# Patient Record
Sex: Female | Born: 1935 | Race: White | Hispanic: No | Smoking: Former smoker
Health system: Southern US, Community
[De-identification: ages and names within clinical notes are randomized; demographics above are authoritative.]

## PROBLEM LIST (undated history)

## (undated) DIAGNOSIS — A419 Sepsis, unspecified organism: Secondary | ICD-10-CM

## (undated) DIAGNOSIS — K115 Sialolithiasis: Secondary | ICD-10-CM

## (undated) DIAGNOSIS — R0600 Dyspnea, unspecified: Secondary | ICD-10-CM

## (undated) DIAGNOSIS — I2510 Atherosclerotic heart disease of native coronary artery without angina pectoris: Secondary | ICD-10-CM

## (undated) DIAGNOSIS — Z8619 Personal history of other infectious and parasitic diseases: Secondary | ICD-10-CM

## (undated) DIAGNOSIS — I10 Essential (primary) hypertension: Secondary | ICD-10-CM

## (undated) DIAGNOSIS — T8859XA Other complications of anesthesia, initial encounter: Secondary | ICD-10-CM

## (undated) DIAGNOSIS — I509 Heart failure, unspecified: Secondary | ICD-10-CM

## (undated) DIAGNOSIS — N200 Calculus of kidney: Secondary | ICD-10-CM

## (undated) DIAGNOSIS — Z87442 Personal history of urinary calculi: Secondary | ICD-10-CM

## (undated) DIAGNOSIS — C801 Malignant (primary) neoplasm, unspecified: Secondary | ICD-10-CM

## (undated) DIAGNOSIS — J449 Chronic obstructive pulmonary disease, unspecified: Secondary | ICD-10-CM

## (undated) DIAGNOSIS — Z9289 Personal history of other medical treatment: Secondary | ICD-10-CM

## (undated) DIAGNOSIS — M1990 Unspecified osteoarthritis, unspecified site: Secondary | ICD-10-CM

## (undated) DIAGNOSIS — M40209 Unspecified kyphosis, site unspecified: Secondary | ICD-10-CM

## (undated) DIAGNOSIS — T4145XA Adverse effect of unspecified anesthetic, initial encounter: Secondary | ICD-10-CM

## (undated) DIAGNOSIS — Z8679 Personal history of other diseases of the circulatory system: Secondary | ICD-10-CM

## (undated) HISTORY — PX: BUNIONECTOMY: SHX129

## (undated) HISTORY — PX: TONSILLECTOMY: SUR1361

## (undated) HISTORY — PX: CHOLECYSTECTOMY: SHX55

## (undated) HISTORY — DX: Essential (primary) hypertension: I10

## (undated) HISTORY — PX: TOTAL HIP ARTHROPLASTY: SHX124

## (undated) HISTORY — PX: LITHOTRIPSY: SUR834

## (undated) HISTORY — PX: CATARACT EXTRACTION: SUR2

## (undated) HISTORY — PX: BLADDER SURGERY: SHX569

## (undated) HISTORY — DX: Sepsis, unspecified organism: A41.9

---
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
## 2008-06-06 ENCOUNTER — Ambulatory Visit: Payer: Self-pay | Admitting: Cardiology

## 2009-01-23 ENCOUNTER — Ambulatory Visit (HOSPITAL_COMMUNITY): Admission: RE | Admit: 2009-01-23 | Discharge: 2009-01-23 | Payer: Self-pay | Admitting: Urology

## 2012-04-03 DIAGNOSIS — R079 Chest pain, unspecified: Secondary | ICD-10-CM

## 2012-04-10 ENCOUNTER — Other Ambulatory Visit: Payer: Self-pay | Admitting: Cardiology

## 2012-04-10 ENCOUNTER — Encounter: Payer: Self-pay | Admitting: Cardiology

## 2012-04-10 DIAGNOSIS — R079 Chest pain, unspecified: Secondary | ICD-10-CM

## 2012-04-10 DIAGNOSIS — R0602 Shortness of breath: Secondary | ICD-10-CM

## 2012-04-20 ENCOUNTER — Telehealth: Payer: Self-pay | Admitting: *Deleted

## 2012-04-20 NOTE — Telephone Encounter (Signed)
Message copied by [name] on Thu Apr 20, 2012  1:41 PM ------      Message from: Lewayne Bunting E      Created: Wed Apr 19, 2012  6:57 AM       Normal stress ECHO

## 2012-04-20 NOTE — Telephone Encounter (Signed)
Notes Recorded by [name], LPN on 9/81/1914 at 1:41 PM Patient notified and verbalized understanding.

## 2012-10-12 ENCOUNTER — Encounter (INDEPENDENT_AMBULATORY_CARE_PROVIDER_SITE_OTHER): Payer: Self-pay | Admitting: *Deleted

## 2012-11-06 ENCOUNTER — Telehealth (INDEPENDENT_AMBULATORY_CARE_PROVIDER_SITE_OTHER): Payer: Self-pay | Admitting: *Deleted

## 2012-11-06 ENCOUNTER — Encounter (INDEPENDENT_AMBULATORY_CARE_PROVIDER_SITE_OTHER): Payer: Self-pay | Admitting: Internal Medicine

## 2012-11-06 ENCOUNTER — Encounter (HOSPITAL_COMMUNITY): Payer: Self-pay | Admitting: Pharmacy Technician

## 2012-11-06 ENCOUNTER — Other Ambulatory Visit (INDEPENDENT_AMBULATORY_CARE_PROVIDER_SITE_OTHER): Payer: Self-pay | Admitting: *Deleted

## 2012-11-06 ENCOUNTER — Ambulatory Visit (INDEPENDENT_AMBULATORY_CARE_PROVIDER_SITE_OTHER): Payer: Medicare Other | Admitting: Internal Medicine

## 2012-11-06 VITALS — BP 130/48 | HR 68 | Temp 98.0°F | Ht 59.5 in | Wt 119.4 lb

## 2012-11-06 DIAGNOSIS — Z1211 Encounter for screening for malignant neoplasm of colon: Secondary | ICD-10-CM

## 2012-11-06 DIAGNOSIS — K625 Hemorrhage of anus and rectum: Secondary | ICD-10-CM

## 2012-11-06 DIAGNOSIS — I10 Essential (primary) hypertension: Secondary | ICD-10-CM

## 2012-11-06 MED ORDER — PEG-KCL-NACL-NASULF-NA ASC-C 100 G PO SOLR: 1.0000 | Freq: Once | ORAL | Status: DC

## 2012-11-06 NOTE — Telephone Encounter (Signed)
Patient needs movi prep 

## 2012-11-06 NOTE — Progress Notes (Signed)
Subjective:     Patient ID: Hannah Mckee, female   DOB: 07-14-1931, 79 y.o.   MRN: [id number]  HPIReferred to our office by Dr. Sherril Mckee for a colonoscopy. Seen in their office and was positive for blood on heme card.  She also tells me she had a external hemorrhoids and noted to have lacerated hemorrhoid per notes.  from where her stool was so hard. She usually has a BM four times a week. She takes Miralax once a week. Her stools are brown. She has seen bright red blood mixed in with her stools. Occurred x 1. She tells me she stays constipated if she doesn't take the Miralax. Appetite is good. No weight loss. No abdominal pain. She tells me she has no pain anywhere.  Takes ASA 81mg  daily.  She takes Ibuprofen 2 tabs about twice a week for headaches. She does not exceed over 4 a week.  10/07/2012 H and H 13.1 and 40.5, MCV 88. (CBC was normal).  ALP 74, AST 12, ALT 9, Glucose 97. (Cmet normal).  Colonoscopy Dr. Cleotis Mckee 2007: Rectum appeared normal. Mild diverticulosis was found in the sigmoid. A stricture vs sharp angulation was found in the perianal sigmoid colon. Rectoflexion was not performed.  Barium Enema 11/30/2005: Negative single contrast barium. Overall sigmoid itself is slightly narrowed but without focal stricture. No mucosa or mural abnormalities are identified. Review of Systems     Objective:   Physical Exam  Filed Vitals:   11/06/12 1100  BP: 130/48  Pulse: 68  Temp: 98 F (36.7 C)  Height: 4' 11.5" (1.511 m)  Weight: 119 lb 6.4 oz (54.159 kg)  Alert and oriented. Skin warm and dry. Oral mucosa is moist.   . Sclera anicteric, conjunctivae is pink. Thyroid not enlarged. No cervical lymphadenopathy. Lungs clear. Heart regular rate and rhythm.  Abdomen is soft. Bowel sounds are positive. No hepatomegaly. No abdominal masses felt. No tenderness.  No edema to lower extremities.Stool brown and guaiac negative. No external hemorroids      Assessment:       Rectal bleed.  Colonic neoplasm, AVM, polyp needs to be ruled out,. I discussed this case with Dr. Karilyn Mckee.   Plan:    Colonoscopy with Dr. Karilyn Mckee

## 2012-11-06 NOTE — Patient Instructions (Addendum)
Colonoscopy with Dr. Rehman 

## 2012-11-13 ENCOUNTER — Telehealth (INDEPENDENT_AMBULATORY_CARE_PROVIDER_SITE_OTHER): Payer: Self-pay | Admitting: *Deleted

## 2012-11-13 DIAGNOSIS — Z1211 Encounter for screening for malignant neoplasm of colon: Secondary | ICD-10-CM

## 2012-11-13 MED ORDER — PEG-KCL-NACL-NASULF-NA ASC-C 100 G PO SOLR: 1.0000 | Freq: Once | ORAL | Status: DC

## 2012-11-13 NOTE — Telephone Encounter (Signed)
Patient states Hannah Mckee didn't receive, please re-send

## 2012-11-16 ENCOUNTER — Encounter (HOSPITAL_COMMUNITY)
Admission: RE | Disposition: A | Discharge status: Home / Self Care | Payer: Self-pay | Source: Ambulatory Visit | Attending: Internal Medicine

## 2012-11-16 ENCOUNTER — Encounter (HOSPITAL_COMMUNITY): Payer: Self-pay | Admitting: *Deleted

## 2012-11-16 ENCOUNTER — Ambulatory Visit (HOSPITAL_COMMUNITY)
Admission: RE | Admit: 2012-11-16 | Discharge: 2012-11-16 | Disposition: A | Discharge status: Home / Self Care | Payer: Medicare Other | Source: Ambulatory Visit | Attending: Internal Medicine | Admitting: Internal Medicine

## 2012-11-16 DIAGNOSIS — K5730 Diverticulosis of large intestine without perforation or abscess without bleeding: Secondary | ICD-10-CM | POA: Insufficient documentation

## 2012-11-16 DIAGNOSIS — K5732 Diverticulitis of large intestine without perforation or abscess without bleeding: Secondary | ICD-10-CM

## 2012-11-16 DIAGNOSIS — K625 Hemorrhage of anus and rectum: Secondary | ICD-10-CM

## 2012-11-16 DIAGNOSIS — K921 Melena: Secondary | ICD-10-CM

## 2012-11-16 DIAGNOSIS — I10 Essential (primary) hypertension: Secondary | ICD-10-CM | POA: Insufficient documentation

## 2012-11-16 DIAGNOSIS — K633 Ulcer of intestine: Secondary | ICD-10-CM | POA: Insufficient documentation

## 2012-11-16 HISTORY — DX: Unspecified osteoarthritis, unspecified site: M19.90

## 2012-11-16 HISTORY — DX: Heart failure, unspecified: I50.9

## 2012-11-16 HISTORY — PX: COLONOSCOPY: SHX5424

## 2012-11-16 SURGERY — COLONOSCOPY
Anesthesia: Moderate Sedation

## 2012-11-16 MED ORDER — MEPERIDINE HCL 50 MG/ML IJ SOLN
INTRAMUSCULAR | Status: AC
  Filled 2012-11-16: qty 1

## 2012-11-16 MED ORDER — SODIUM CHLORIDE 0.45 % IV SOLN
INTRAVENOUS | Status: DC
  Administered 2012-11-16: 1000 mL via INTRAVENOUS

## 2012-11-16 MED ORDER — MIDAZOLAM HCL 5 MG/5ML IJ SOLN
INTRAMUSCULAR | Status: AC
  Filled 2012-11-16: qty 10

## 2012-11-16 MED ORDER — MIDAZOLAM HCL 5 MG/5ML IJ SOLN
INTRAMUSCULAR | Status: DC | PRN
  Administered 2012-11-16: 2 mg via INTRAVENOUS
  Administered 2012-11-16 (×4): 1 mg via INTRAVENOUS
  Administered 2012-11-16: 2 mg via INTRAVENOUS
  Administered 2012-11-16: 1 mg via INTRAVENOUS

## 2012-11-16 MED ORDER — METRONIDAZOLE 500 MG PO TABS: 500.0000 mg | ORAL_TABLET | Freq: Two times a day (BID) | ORAL | Status: DC

## 2012-11-16 MED ORDER — CIPROFLOXACIN HCL 500 MG PO TABS: 500.0000 mg | ORAL_TABLET | Freq: Two times a day (BID) | ORAL | Status: DC

## 2012-11-16 MED ORDER — STERILE WATER FOR IRRIGATION IR SOLN
Status: DC | PRN
  Administered 2012-11-16: 13:00:00

## 2012-11-16 MED ORDER — MEPERIDINE HCL 50 MG/ML IJ SOLN
INTRAMUSCULAR | Status: DC | PRN
  Administered 2012-11-16 (×2): 25 mg via INTRAVENOUS

## 2012-11-16 NOTE — H&P (Signed)
Hannah Mckee is an 79 y.o. female.   Chief Complaint: [facility] for [facility]oscopy. HPI: Patient is 79 year old Caucasian female who was recently found to have heme-positive stools and therefore sent over for colonoscopy. She did have one examination at [facility] in [facility] by Dr. Tish Men. IT was an incomplete exam and followed by barium enema. She denies abdominal pain anorexia weight loss. Her bowels usually move regularly. Every now and then she may have constipation. Family history is negative for colorectal carcinoma  Past Medical History  Diagnosis Date  . Hypertension   . Sepsis   . Arthritis   . Osteoarthritis   . CHF (congestive heart failure)     Past Surgical History  Procedure Date  . Tonsillectomy   . Cholecystectomy   . Total hip arthroplasty     1989  . Bunionectomy   . Lithotripsy   . Bilateral cataract surg     History reviewed. No pertinent family history. Social History:  reports that she has never smoked. She does not have any smokeless tobacco history on file. She reports that she does not drink alcohol or use illicit drugs.  Allergies:  Allergies  Allergen Reactions  . Erythromycin   . Penicillins   . Sulfa Antibiotics     Medications Prior to Admission  Medication Sig Dispense Refill  . alendronate (FOSAMAX) 70 MG tablet Take 70 mg by mouth every 7 (seven) days. Take with a full glass of water on an empty stomach.      Marland Kitchen aspirin 81 MG tablet Take 81 mg by mouth daily.      Marland Kitchen atenolol (TENORMIN) 25 MG tablet Take 12.5 mg by mouth as needed.      . benazepril (LOTENSIN) 20 MG tablet Take 20 mg by mouth 2 (two) times daily before a meal.      . calcium carbonate (OS-CAL) 600 MG TABS Take 600 mg by mouth daily.      . Cranberry 500 MG CAPS Take by mouth daily.      . diazepam (VALIUM) 5 MG tablet Take 5 mg by mouth every 6 (six) hours as needed. Anxiety.      . furosemide (LASIX) 40 MG tablet Take 40 mg by mouth daily.      .  nitrofurantoin (MACRODANTIN) 100 MG capsule Take 100 mg by mouth daily.      Marland Kitchen oxyCODONE-acetaminophen (PERCOCET) 10-325 MG per tablet Take 1 tablet by mouth every 4 (four) hours as needed. Pain.      . peg 3350 powder (MOVIPREP) 100 G SOLR Take 1 kit (100 g total) by mouth once.  1 kit  0  . polyethylene glycol (MIRALAX / GLYCOLAX) packet Take 17 g by mouth once a week.      . potassium chloride (KLOR-CON) 20 MEQ packet Take 20 mEq by mouth 2 (two) times daily.      . promethazine (PHENERGAN) 25 MG tablet Take 25 mg by mouth every 6 (six) hours as needed. Nausea.        No results found for this or any previous visit (from the past 48 hour(s)). No results found.  ROS  Blood pressure 162/55, pulse 82, temperature 97.5 F (36.4 C), temperature source Oral, resp. rate 17, SpO2 100.00%. Physical Exam  Constitutional: She appears well-developed.  HENT:  Mouth/Throat: Oropharynx is clear and moist.  Eyes: Conjunctivae normal are normal. No scleral icterus.  Neck: No thyromegaly present.  Cardiovascular: Normal rate, regular rhythm and normal heart sounds.  No murmur heard. Respiratory: Effort normal and breath sounds normal.  GI: She exhibits no distension and no mass. There is no tenderness.       Lower midline scar  Musculoskeletal: She exhibits no edema.  Lymphadenopathy:    She has no cervical adenopathy.  Neurological: She is alert.  Skin: Skin is warm.     Assessment/Plan Heme positive stools. Diagnostic colonoscopy.  REHMAN,NAJEEB U 11/16/2012, 1:19 PM

## 2012-11-16 NOTE — Op Note (Addendum)
COLONOSCOPY PROCEDURE REPORT  PATIENT:  Hannah Mckee  MR#:  [id number] Birthdate:  [birth year], 79 y.o., female Endoscopist:  Dr. Malissa Hippo, MD Referred By:  Dr. Ignatius Specking, MD Procedure Date: 11/16/2012  Procedure:   Colonoscopy  Indications:  Patient is 79 year old Caucasian female was undergoing diagnostic colonoscopy because of heme-positive stool. Her last colonoscopy was in 2007 elsewhere and was incomplete to the  Informed Consent:  The procedure and risks were reviewed with the patient and informed consent was obtained.  Medications:  Demerol 50 mg IV Versed 9 mg IV  Description of procedure:  After a digital rectal exam was performed, that colonoscope was advanced from the anus through the rectum and colon to the area of the cecum, ileocecal valve and appendiceal orifice. The cecum was deeply intubated. These structures were well-seen and photographed for the record. From the level of the cecum and ileocecal valve, the scope was slowly and cautiously withdrawn. The mucosal surfaces were carefully surveyed utilizing scope tip to flexion to facilitate fold flattening as needed. The scope was pulled down into the rectum where a thorough exam including retroflexion was performed.  Findings:   Excellent prep. Very tortuous sigmoid colon resulting in difficult exam. Number of diverticula at sigmoid colon and one with granulation tissue and exudate consistent with diverticulitis. Normal rectal mucosa and anal rectal junction.  Therapeutic/Diagnostic Maneuvers Performed:  None  Complications:  None  Cecal Withdrawal Time:  20  minutes  Impression:  Examination performed to cecum. Very tortuous colon. Moderate diverticula at sigmoid colon. Ulceration and exudate involving one large diverticulum at sigmoid colon consistent with diverticulitis. Few scattered erosions at sigmoid and descending colon. Normal rectal mucosa and anorectal junction.  Recommendations:    Standard instructions given. Cipro 500 mg by mouth twice a day for 2 weeks. Metronidazole 500 mg by mouth twice a day for 2 weeks. Hold Fosamax for 4 weeks. Hold Macrodantin seen while on Cipro and metronidazole.   Rosevelt Luu U  11/16/2012 2:54 PM  CC: Dr. Ignatius Specking., MD & Dr. Bonnetta Barry ref. provider found

## 2012-11-20 ENCOUNTER — Encounter (HOSPITAL_COMMUNITY): Payer: Self-pay | Admitting: Internal Medicine

## 2012-11-22 ENCOUNTER — Telehealth (INDEPENDENT_AMBULATORY_CARE_PROVIDER_SITE_OTHER): Payer: Self-pay | Admitting: *Deleted

## 2012-11-22 NOTE — Telephone Encounter (Signed)
Patient called and states that the Cipro / Flagyl are making her sick ,nauseous. She wants to know if she can take Phenergan.

## 2012-11-23 NOTE — Telephone Encounter (Signed)
Discontinue Cipro and Flagyl. Doxycycline 100 mg by mouth daily for 7 days (Patient allergic to penicillin).

## 2012-11-23 NOTE — Telephone Encounter (Signed)
Doxycycline 100 mg take 1 by mouth daily for 7 days called to [facility] Hannah Mckee. Numerous attempts to call patient ,but her line is busy.

## 2012-12-05 ENCOUNTER — Encounter (INDEPENDENT_AMBULATORY_CARE_PROVIDER_SITE_OTHER): Payer: Self-pay

## 2013-01-02 ENCOUNTER — Other Ambulatory Visit (INDEPENDENT_AMBULATORY_CARE_PROVIDER_SITE_OTHER): Payer: Self-pay | Admitting: Internal Medicine

## 2013-01-02 ENCOUNTER — Telehealth (INDEPENDENT_AMBULATORY_CARE_PROVIDER_SITE_OTHER): Payer: Self-pay | Admitting: *Deleted

## 2013-01-02 DIAGNOSIS — Z0189 Encounter for other specified special examinations: Secondary | ICD-10-CM

## 2013-01-02 DIAGNOSIS — K5732 Diverticulitis of large intestine without perforation or abscess without bleeding: Secondary | ICD-10-CM

## 2013-01-02 NOTE — Telephone Encounter (Signed)
Lab noted and faxed to [facility]. 

## 2013-01-02 NOTE — Telephone Encounter (Signed)
Per Dr.Rehman the patient needs to have a CT Abd/Pelvic with Contrast. Recent Colonoscopy Diverticulitis 11/06/12  The patient request, due to transportation, a midmorning or a early afternoon appointment at [facility].  I will call patient and let her know that this will be arranged and that Dewayne Hatch will call her with date and time,ect.  Forwarded to [place] to arrange

## 2013-01-02 NOTE — Telephone Encounter (Signed)
CT sch'd 01/05/13 @ 1:00 (12:45), pick up contrast, patient aware  She will need order for creatinine sent to [facility]

## 2013-01-03 ENCOUNTER — Ambulatory Visit (HOSPITAL_COMMUNITY)
Admission: RE | Admit: 2013-01-03 | Discharge: 2013-01-03 | Disposition: A | Discharge status: Home / Self Care | Payer: Medicare Other | Source: Ambulatory Visit | Attending: Internal Medicine | Admitting: Internal Medicine

## 2013-01-03 DIAGNOSIS — N200 Calculus of kidney: Secondary | ICD-10-CM | POA: Insufficient documentation

## 2013-01-03 DIAGNOSIS — R109 Unspecified abdominal pain: Secondary | ICD-10-CM | POA: Insufficient documentation

## 2013-01-03 DIAGNOSIS — I2510 Atherosclerotic heart disease of native coronary artery without angina pectoris: Secondary | ICD-10-CM | POA: Insufficient documentation

## 2013-01-03 DIAGNOSIS — K5732 Diverticulitis of large intestine without perforation or abscess without bleeding: Secondary | ICD-10-CM

## 2013-01-03 DIAGNOSIS — K5730 Diverticulosis of large intestine without perforation or abscess without bleeding: Secondary | ICD-10-CM | POA: Insufficient documentation

## 2013-01-03 MED ORDER — IOHEXOL 300 MG/ML  SOLN
100.0000 mL | Freq: Once | INTRAMUSCULAR | Status: AC | PRN
  Administered 2013-01-03: 100 mL via INTRAVENOUS

## 2013-01-05 ENCOUNTER — Ambulatory Visit (HOSPITAL_COMMUNITY): Payer: Medicare Other

## 2013-04-04 ENCOUNTER — Encounter (INDEPENDENT_AMBULATORY_CARE_PROVIDER_SITE_OTHER): Payer: Self-pay | Admitting: *Deleted

## 2013-04-16 ENCOUNTER — Ambulatory Visit (INDEPENDENT_AMBULATORY_CARE_PROVIDER_SITE_OTHER): Payer: Medicare Other | Admitting: Internal Medicine

## 2013-04-16 ENCOUNTER — Encounter (INDEPENDENT_AMBULATORY_CARE_PROVIDER_SITE_OTHER): Payer: Self-pay | Admitting: Internal Medicine

## 2013-04-16 VITALS — BP 158/54 | HR 84 | Temp 97.7°F | Ht <= 58 in | Wt 123.0 lb

## 2013-04-16 DIAGNOSIS — K5900 Constipation, unspecified: Secondary | ICD-10-CM | POA: Insufficient documentation

## 2013-04-16 DIAGNOSIS — K5792 Diverticulitis of intestine, part unspecified, without perforation or abscess without bleeding: Secondary | ICD-10-CM

## 2013-04-16 DIAGNOSIS — K5732 Diverticulitis of large intestine without perforation or abscess without bleeding: Secondary | ICD-10-CM

## 2013-04-16 MED ORDER — LINACLOTIDE 145 MCG PO CAPS: 145.0000 ug | ORAL_CAPSULE | ORAL | Status: DC | PRN

## 2013-04-16 NOTE — Progress Notes (Signed)
Subjective:     Patient ID: Hannah Mckee, female   DOB: [birth year], 79 y.o.   MRN: [id number]  HPI Here today for f/u. Recently underwent a colonoscopy and found to have diverticulitis. She underwent the colonoscopy for heme positive stools.  She was started on Cipro and Flagyl. She tells me she never hurt when she had the diverticulitis.  She denies any abdominal pain.  She has a BM usually 1-2 a week.  She takes Linzess for constipation. She tells me the Linzess is helping.  She is borrowing the Linzess from a friend.  Appetite is good. No weight loss.   No rectal bleeding or melena.  11/16/2012 Colonoscopy: Impression:  Examination performed to cecum.  Very tortuous colon.  Moderate diverticula at sigmoid colon.  Ulceration and exudate involving one large diverticulum at sigmoid colon consistent with diverticulitis.  Few scattered erosions at sigmoid and descending colon.  Normal rectal mucosa and anorectal junction.    01/02/2013 CT abdomen/pelvis with CM:   1. Sigmoid diverticulosis without CT evidence of diverticulitis or  abscess.  2. Bilateral nephrolithiasis without hydronephrosis or ureteral  calculus.  3. Atherosclerosis, including . coronary artery disease.  Please note that although the presence of coronary artery calcium  documents the presence of coronary artery disease, the severity of  this disease and any potential stenosis cannot be assessed on this  non-gated CT examination. Assessment for potential risk factor  modification, dietary therapy or pharmacologic therapy may be  warranted, if clinically indicated.     Review of Systems see hpi Current Outpatient Prescriptions  Medication Sig Dispense Refill  . alendronate (FOSAMAX) 70 MG tablet Take 70 mg by mouth every 7 (seven) days. Take with a full glass of water on an empty stomach.      Marland Kitchen aspirin 81 MG tablet Take 81 mg by mouth daily.      Marland Kitchen atenolol (TENORMIN) 25 MG tablet Take 12.5 mg by mouth as needed.       . benazepril (LOTENSIN) 20 MG tablet Take 20 mg by mouth 2 (two) times daily before a meal.      . calcium carbonate (OS-CAL) 600 MG TABS Take 600 mg by mouth daily.      . Cranberry 500 MG CAPS Take by mouth daily.      . diazepam (VALIUM) 5 MG tablet Take 5 mg by mouth every 6 (six) hours as needed. Anxiety.      . furosemide (LASIX) 40 MG tablet Take 40 mg by mouth daily.      Marland Kitchen ibuprofen (ADVIL,MOTRIN) 200 MG tablet Take 200 mg by mouth every 6 (six) hours as needed for pain.      Marland Kitchen oxyCODONE-acetaminophen (PERCOCET) 10-325 MG per tablet Take 1 tablet by mouth every 4 (four) hours as needed. Pain.      . polyethylene glycol (MIRALAX / GLYCOLAX) packet Take 17 g by mouth once a week.      . potassium chloride (KLOR-CON) 20 MEQ packet Take 20 mEq by mouth 2 (two) times daily.      . Probiotic Product (FLORAJEN3 PO) Take by mouth.      . promethazine (PHENERGAN) 25 MG tablet Take 25 mg by mouth every 6 (six) hours as needed. Nausea.       No current facility-administered medications for this visit.   Past Medical History  Diagnosis Date  . Hypertension   . Sepsis   . Arthritis   . Osteoarthritis   . CHF (congestive heart  failure)         Objective:   Physical Exam  Filed Vitals:   04/16/13 1048  Height: 4\' 7"  (1.397 m)  Weight: 123 lb (55.792 kg)   Alert and oriented. Skin warm and dry. Oral mucosa is moist.   . Sclera anicteric, conjunctivae is pink. Thyroid not enlarged. No cervical lymphadenopathy. Lungs clear. Heart regular rate and rhythm.  Abdomen is soft. Bowel sounds are positive. No hepatomegaly. No abdominal masses felt. No tenderness.  No edema to lower extremities.       Assessment:    Hx of diverticultis. No symptoms at this time. Constipation: She has been using Linzess (borrowing from a friend). Linzess has helped her constipation.    Plan:     Linzess every other day. OV in 6 months. If any problems, call our office.

## 2013-04-16 NOTE — Patient Instructions (Addendum)
Linzess every other day.  OV in 6 months.

## 2013-10-16 ENCOUNTER — Encounter (INDEPENDENT_AMBULATORY_CARE_PROVIDER_SITE_OTHER): Payer: Self-pay | Admitting: Internal Medicine

## 2013-10-16 ENCOUNTER — Ambulatory Visit (INDEPENDENT_AMBULATORY_CARE_PROVIDER_SITE_OTHER): Payer: Medicare Other | Admitting: Internal Medicine

## 2013-10-16 VITALS — BP 132/58 | HR 60 | Temp 97.7°F | Ht <= 58 in | Wt 117.2 lb

## 2013-10-16 DIAGNOSIS — K5732 Diverticulitis of large intestine without perforation or abscess without bleeding: Secondary | ICD-10-CM

## 2013-10-16 NOTE — Patient Instructions (Signed)
Continue with present medications. Follow up in one year.

## 2013-10-16 NOTE — Progress Notes (Signed)
Subjective:     Patient ID: Hannah Mckee, female   DOB: [birth year], 79 y.o.   MRN: [id number]  HPI Here today for f/u.  She tells me today she occasionally has left lower fleeting abdominal pain. There is no pain today.  She will have the pain maybe once or twice a week. She has a hx of diverticulitis. Found on colonoscopy Appetite is good. She has lost 5 pounds since her last office visit in June.  She usually has a BM about 3 a week or sometimes she will have a BM once a week. She denies melena or bright red rectal bleeding.   11/16/2012 Colonoscopy:  Impression:  Examination performed to cecum.  Very tortuous colon.  Moderate diverticula at sigmoid colon.  Ulceration and exudate involving one large diverticulum at sigmoid colon consistent with diverticulitis.  Few scattered erosions at sigmoid and descending colon.  Normal rectal mucosa and anorectal junction  Review of Systems Current Outpatient Prescriptions  Medication Sig Dispense Refill  . alendronate (FOSAMAX) 70 MG tablet Take 70 mg by mouth every 7 (seven) days. Take with a full glass of water on an empty stomach.      Marland Kitchen aspirin 81 MG tablet Take 81 mg by mouth daily.      . benazepril (LOTENSIN) 20 MG tablet Take 20 mg by mouth 2 (two) times daily before a meal.      . calcium carbonate (OS-CAL) 600 MG TABS Take 600 mg by mouth daily.      . diazepam (VALIUM) 5 MG tablet Take 5 mg by mouth every 6 (six) hours as needed. Anxiety.      . furosemide (LASIX) 40 MG tablet Take 40 mg by mouth once a week.       . Linaclotide (LINZESS) 145 MCG CAPS Take 1 capsule (145 mcg total) by mouth as needed.  30 capsule  5  . oxyCODONE-acetaminophen (PERCOCET) 10-325 MG per tablet Take 1 tablet by mouth every 4 (four) hours as needed. Pain.      . polyethylene glycol (MIRALAX / GLYCOLAX) packet Take 17 g by mouth as needed.       . potassium chloride (KLOR-CON) 20 MEQ packet Take 20 mEq by mouth as needed.       . promethazine (PHENERGAN)  25 MG tablet Take 25 mg by mouth every 6 (six) hours as needed. Nausea.       No current facility-administered medications for this visit.   .med Past Surgical History  Procedure Laterality Date  . Tonsillectomy    . Cholecystectomy    . Total hip arthroplasty      1989  . Bunionectomy    . Lithotripsy    . Bilateral cataract surg    . Colonoscopy  11/16/2012    Procedure: COLONOSCOPY;  Surgeon: Malissa Hippo, MD;  Location: [facility];  Service: Endoscopy;  Laterality: N/A;  100   Allergies  Allergen Reactions  . Erythromycin   . Penicillins   . Sulfa Antibiotics         Objective:   Physical Exam  Filed Vitals:   10/16/13 1024  BP: 132/58  Pulse: 60  Temp: 97.7 F (36.5 C)  Height: 4\' 7"  (1.397 m)  Weight: 117 lb 3.2 oz (53.162 kg)  Alert and oriented. Skin warm and dry. Oral mucosa is moist.   . Sclera anicteric, conjunctivae is pink. Thyroid not enlarged. No cervical lymphadenopathy. Lungs clear. Heart regular rate and rhythm.  Abdomen is soft.  Bowel sounds are positive. No hepatomegaly. No abdominal masses felt. No tenderness.  No edema to lower extremities.       Assessment:    Hx of diverticulitis. She is asympotomatic at this time. She does have a hx of constipation.    She takes Linzess and Miralax on a prn basis.    Plan:    Increase Linzess to every other day. Increase fiber. OV in 1 yr.

## 2014-08-07 ENCOUNTER — Encounter (INDEPENDENT_AMBULATORY_CARE_PROVIDER_SITE_OTHER): Payer: Self-pay | Admitting: *Deleted

## 2014-10-28 ENCOUNTER — Inpatient Hospital Stay (HOSPITAL_COMMUNITY)
Admission: EM | Admit: 2014-10-28 | Discharge: 2014-10-31 | DRG: 481 | Disposition: A | Discharge status: Skilled Nursing Facility | Payer: Medicare Other | Attending: Internal Medicine | Admitting: Internal Medicine

## 2014-10-28 ENCOUNTER — Emergency Department (HOSPITAL_COMMUNITY): Payer: Medicare Other

## 2014-10-28 ENCOUNTER — Encounter (HOSPITAL_COMMUNITY): Payer: Self-pay

## 2014-10-28 DIAGNOSIS — M40209 Unspecified kyphosis, site unspecified: Secondary | ICD-10-CM | POA: Diagnosis present

## 2014-10-28 DIAGNOSIS — Z96641 Presence of right artificial hip joint: Secondary | ICD-10-CM | POA: Diagnosis present

## 2014-10-28 DIAGNOSIS — G8929 Other chronic pain: Secondary | ICD-10-CM | POA: Diagnosis present

## 2014-10-28 DIAGNOSIS — Y831 Surgical operation with implant of artificial internal device as the cause of abnormal reaction of the patient, or of later complication, without mention of misadventure at the time of the procedure: Secondary | ICD-10-CM | POA: Diagnosis present

## 2014-10-28 DIAGNOSIS — J449 Chronic obstructive pulmonary disease, unspecified: Secondary | ICD-10-CM | POA: Insufficient documentation

## 2014-10-28 DIAGNOSIS — K5732 Diverticulitis of large intestine without perforation or abscess without bleeding: Secondary | ICD-10-CM | POA: Diagnosis present

## 2014-10-28 DIAGNOSIS — S7291XA Unspecified fracture of right femur, initial encounter for closed fracture: Secondary | ICD-10-CM

## 2014-10-28 DIAGNOSIS — D62 Acute posthemorrhagic anemia: Secondary | ICD-10-CM | POA: Diagnosis not present

## 2014-10-28 DIAGNOSIS — I10 Essential (primary) hypertension: Secondary | ICD-10-CM | POA: Diagnosis present

## 2014-10-28 DIAGNOSIS — Z7982 Long term (current) use of aspirin: Secondary | ICD-10-CM | POA: Diagnosis not present

## 2014-10-28 DIAGNOSIS — M810 Age-related osteoporosis without current pathological fracture: Secondary | ICD-10-CM | POA: Diagnosis present

## 2014-10-28 DIAGNOSIS — J42 Unspecified chronic bronchitis: Secondary | ICD-10-CM

## 2014-10-28 DIAGNOSIS — W010XXA Fall on same level from slipping, tripping and stumbling without subsequent striking against object, initial encounter: Secondary | ICD-10-CM | POA: Diagnosis present

## 2014-10-28 DIAGNOSIS — S72301A Unspecified fracture of shaft of right femur, initial encounter for closed fracture: Secondary | ICD-10-CM

## 2014-10-28 DIAGNOSIS — T84048A Periprosthetic fracture around other internal prosthetic joint, initial encounter: Principal | ICD-10-CM | POA: Diagnosis present

## 2014-10-28 DIAGNOSIS — W19XXXA Unspecified fall, initial encounter: Secondary | ICD-10-CM | POA: Insufficient documentation

## 2014-10-28 DIAGNOSIS — E119 Type 2 diabetes mellitus without complications: Secondary | ICD-10-CM | POA: Diagnosis present

## 2014-10-28 DIAGNOSIS — I509 Heart failure, unspecified: Secondary | ICD-10-CM | POA: Diagnosis present

## 2014-10-28 DIAGNOSIS — S7290XA Unspecified fracture of unspecified femur, initial encounter for closed fracture: Secondary | ICD-10-CM | POA: Diagnosis present

## 2014-10-28 HISTORY — DX: Chronic obstructive pulmonary disease, unspecified: J44.9

## 2014-10-28 LAB — CBC WITH DIFFERENTIAL/PLATELET
Basophils Absolute: 0 10*3/uL (ref 0.0–0.1)
Basophils Relative: 0 % (ref 0–1)
Eosinophils Absolute: 0.1 10*3/uL (ref 0.0–0.7)
Eosinophils Relative: 1 % (ref 0–5)
HCT: 38.9 % (ref 36.0–46.0)
Hemoglobin: 12.3 g/dL (ref 12.0–15.0)
Lymphocytes Relative: 6 % — ABNORMAL LOW (ref 12–46)
Lymphs Abs: 0.8 10*3/uL (ref 0.7–4.0)
MCH: 27.2 pg (ref 26.0–34.0)
MCHC: 31.6 g/dL (ref 30.0–36.0)
MCV: 86.1 fL (ref 78.0–100.0)
Monocytes Absolute: 0.7 10*3/uL (ref 0.1–1.0)
Monocytes Relative: 5 % (ref 3–12)
Neutro Abs: 12.2 10*3/uL — ABNORMAL HIGH (ref 1.7–7.7)
Neutrophils Relative %: 88 % — ABNORMAL HIGH (ref 43–77)
Platelets: 268 10*3/uL (ref 150–400)
RBC: 4.52 MIL/uL (ref 3.87–5.11)
RDW: 13.9 % (ref 11.5–15.5)
WBC: 13.8 10*3/uL — ABNORMAL HIGH (ref 4.0–10.5)

## 2014-10-28 LAB — BASIC METABOLIC PANEL
Anion gap: 6 (ref 5–15)
BUN: 14 mg/dL (ref 6–23)
CO2: 28 mmol/L (ref 19–32)
Calcium: 9.3 mg/dL (ref 8.4–10.5)
Chloride: 105 mEq/L (ref 96–112)
Creatinine, Ser: 0.67 mg/dL (ref 0.50–1.10)
GFR calc Af Amer: >90 mL/min (ref >90)
GFR calc non Af Amer: 80 mL/min — ABNORMAL LOW (ref >90)
Glucose, Bld: 101 mg/dL — ABNORMAL HIGH (ref 70–99)
Potassium: 3.7 mmol/L (ref 3.5–5.1)
Sodium: 139 mmol/L (ref 135–145)

## 2014-10-28 LAB — PROTIME-INR
INR: 1.1 (ref 0.00–1.49)
Prothrombin Time: 14.4 seconds (ref 11.6–15.2)

## 2014-10-28 MED ORDER — BISOPROLOL FUMARATE 5 MG PO TABS
5.0000 mg | ORAL_TABLET | Freq: Every day | ORAL | Status: DC
  Administered 2014-10-29 – 2014-10-30 (×2): 5 mg via ORAL
  Filled 2014-10-28 (×3): qty 1

## 2014-10-28 MED ORDER — BENAZEPRIL HCL 5 MG PO TABS
5.0000 mg | ORAL_TABLET | Freq: Two times a day (BID) | ORAL | Status: DC
  Filled 2014-10-28 (×3): qty 1

## 2014-10-28 MED ORDER — ALUM & MAG HYDROXIDE-SIMETH 200-200-20 MG/5ML PO SUSP: 30.0000 mL | Freq: Four times a day (QID) | ORAL | Status: DC | PRN

## 2014-10-28 MED ORDER — IPRATROPIUM-ALBUTEROL 0.5-2.5 (3) MG/3ML IN SOLN
3.0000 mL | Freq: Four times a day (QID) | RESPIRATORY_TRACT | Status: DC
  Administered 2014-10-28: 3 mL via RESPIRATORY_TRACT
  Filled 2014-10-28: qty 3

## 2014-10-28 MED ORDER — MORPHINE SULFATE 4 MG/ML IJ SOLN
4.0000 mg | Freq: Once | INTRAMUSCULAR | Status: AC
  Administered 2014-10-28: 4 mg via INTRAVENOUS
  Filled 2014-10-28: qty 1

## 2014-10-28 MED ORDER — LINACLOTIDE 145 MCG PO CAPS
145.0000 ug | ORAL_CAPSULE | Freq: Every day | ORAL | Status: DC
  Administered 2014-10-30 – 2014-10-31 (×2): 145 ug via ORAL
  Filled 2014-10-28 (×3): qty 1

## 2014-10-28 MED ORDER — ASPIRIN EC 81 MG PO TBEC
81.0000 mg | DELAYED_RELEASE_TABLET | Freq: Every day | ORAL | Status: DC
  Filled 2014-10-28 (×2): qty 1

## 2014-10-28 MED ORDER — HYDRALAZINE HCL 20 MG/ML IJ SOLN: 10.0000 mg | Freq: Four times a day (QID) | INTRAMUSCULAR | Status: DC | PRN

## 2014-10-28 MED ORDER — SODIUM CHLORIDE 0.9 % IJ SOLN
3.0000 mL | Freq: Two times a day (BID) | INTRAMUSCULAR | Status: DC
  Administered 2014-10-28 – 2014-10-31 (×4): 3 mL via INTRAVENOUS

## 2014-10-28 MED ORDER — HYDROMORPHONE HCL 1 MG/ML IJ SOLN
1.0000 mg | INTRAMUSCULAR | Status: DC | PRN
  Administered 2014-10-28 – 2014-10-30 (×5): 1 mg via INTRAVENOUS
  Filled 2014-10-28 (×6): qty 1

## 2014-10-28 MED ORDER — ONDANSETRON HCL 4 MG PO TABS: 4.0000 mg | ORAL_TABLET | Freq: Four times a day (QID) | ORAL | Status: DC | PRN

## 2014-10-28 MED ORDER — ONDANSETRON HCL 4 MG/2ML IJ SOLN: 4.0000 mg | Freq: Four times a day (QID) | INTRAMUSCULAR | Status: DC | PRN

## 2014-10-28 MED ORDER — IPRATROPIUM-ALBUTEROL 0.5-2.5 (3) MG/3ML IN SOLN: 3.0000 mL | RESPIRATORY_TRACT | Status: DC | PRN

## 2014-10-28 MED ORDER — DIAZEPAM 2 MG PO TABS
2.0000 mg | ORAL_TABLET | Freq: Four times a day (QID) | ORAL | Status: DC | PRN
  Administered 2014-10-28: 2 mg via ORAL
  Filled 2014-10-28 (×2): qty 1

## 2014-10-28 MED ORDER — CALCIUM CARBONATE 600 MG PO TABS
600.0000 mg | ORAL_TABLET | Freq: Every day | ORAL | Status: DC
Start: 2014-10-28 — End: 2014-10-28

## 2014-10-28 MED ORDER — CALCIUM CARBONATE 1250 (500 CA) MG PO TABS
1.0000 | ORAL_TABLET | Freq: Every day | ORAL | Status: DC
  Administered 2014-10-30 – 2014-10-31 (×2): 500 mg via ORAL
  Filled 2014-10-28 (×4): qty 1

## 2014-10-28 MED ORDER — GUAIFENESIN-DM 100-10 MG/5ML PO SYRP
5.0000 mL | ORAL_SOLUTION | ORAL | Status: DC | PRN
  Filled 2014-10-28: qty 5

## 2014-10-28 MED ORDER — POLYETHYLENE GLYCOL 3350 17 G PO PACK
17.0000 g | PACK | Freq: Two times a day (BID) | ORAL | Status: DC
  Administered 2014-10-29 – 2014-10-31 (×4): 17 g via ORAL
  Filled 2014-10-28 (×7): qty 1

## 2014-10-28 MED ORDER — DEXTROSE-NACL 5-0.45 % IV SOLN
INTRAVENOUS | Status: AC
  Administered 2014-10-28: 22:00:00 via INTRAVENOUS

## 2014-10-28 MED ORDER — HYDROCODONE-ACETAMINOPHEN 5-325 MG PO TABS
1.0000 | ORAL_TABLET | ORAL | Status: DC | PRN
  Administered 2014-10-28: 2 via ORAL
  Administered 2014-10-29: 1 via ORAL
  Administered 2014-10-29: 2 via ORAL
  Filled 2014-10-28: qty 2
  Filled 2014-10-28: qty 1
  Filled 2014-10-28: qty 2

## 2014-10-28 MED ORDER — ASPIRIN 81 MG PO TABS
81.0000 mg | ORAL_TABLET | Freq: Every day | ORAL | Status: DC
Start: 2014-10-28 — End: 2014-10-28

## 2014-10-28 NOTE — ED Notes (Signed)
Spoke with nutrition and ordered meal tray for [place] room 12.

## 2014-10-28 NOTE — Consult Note (Signed)
Reason for Consult:  Rip periprosthetic femur fracture Referring Physician:   EDP  Hannah Mckee is an 80 y.o. female.  HPI:   80 yo female with COPD, CHF, and osteoporosis who sustained a mechanical fall earlier today.  She felt a pop in her right thigh and severe pain with the inability to ambulate.  She was brought to [facility] ED and found to have a right femur fracture at the tip of a hip replacement.  The hip replacement was performed at [facility] in the 80's.  She reports only severe right thigh pain.  Past Medical History  Diagnosis Date  . Hypertension   . Sepsis   . Arthritis   . Osteoarthritis   . CHF (congestive heart failure)   . COPD (chronic obstructive pulmonary disease)     Past Surgical History  Procedure Laterality Date  . Tonsillectomy    . Cholecystectomy    . Total hip arthroplasty      1989  . Bunionectomy    . Lithotripsy    . Bilateral cataract surg    . Colonoscopy  11/16/2012    Procedure: COLONOSCOPY;  Surgeon: Malissa Hippo, MD;  Location: [facility];  Service: Endoscopy;  Laterality: N/A;  100    History reviewed. No pertinent family history.  Social History:  reports that she has never smoked. She does not have any smokeless tobacco history on file. She reports that she does not drink alcohol or use illicit drugs.  Allergies:  Allergies  Allergen Reactions  . Erythromycin Swelling  . Levaquin [Levofloxacin] Other (See Comments)    Tongue turns red and sore  . Penicillins Swelling  . Sulfa Antibiotics Other (See Comments)    Tongue turns red and sore    Medications: I have reviewed the patient's current medications.  Results for orders placed or performed during the hospital encounter of 10/28/14 (from the past 48 hour(s))  Basic metabolic panel     Status: Abnormal   Collection Time: 10/28/14  3:03 PM  Result Value Ref Range   Sodium 139 135 - 145 mmol/L    Comment: Please note change in reference range.   Potassium 3.7 3.5 - 5.1  mmol/L    Comment: Please note change in reference range.   Chloride 105 96 - 112 mEq/L   CO2 28 19 - 32 mmol/L   Glucose, Bld 101 (H) 70 - 99 mg/dL   BUN 14 6 - 23 mg/dL   Creatinine, Ser 7.28 0.50 - 1.10 mg/dL   Calcium 9.3 8.4 - 47.3 mg/dL   GFR calc non Af Amer 80 (L) >90 mL/min   GFR calc Af Amer >90 >90 mL/min    Comment: (NOTE) The eGFR has been calculated using the CKD EPI equation. This calculation has not been validated in all clinical situations. eGFR's persistently <90 mL/min signify possible Chronic Kidney Disease.    Anion gap 6 5 - 15  CBC with Differential     Status: Abnormal   Collection Time: 10/28/14  3:03 PM  Result Value Ref Range   WBC 13.8 (H) 4.0 - 10.5 K/uL   RBC 4.52 3.87 - 5.11 MIL/uL   Hemoglobin 12.3 12.0 - 15.0 g/dL   HCT 46.5 74.9 - 63.9 %   MCV 86.1 78.0 - 100.0 fL   MCH 27.2 26.0 - 34.0 pg   MCHC 31.6 30.0 - 36.0 g/dL   RDW 02.2 80.0 - 36.6 %   Platelets 268 150 - 400 K/uL  Neutrophils Relative % 88 (H) 43 - 77 %   Neutro Abs 12.2 (H) 1.7 - 7.7 K/uL   Lymphocytes Relative 6 (L) 12 - 46 %   Lymphs Abs 0.8 0.7 - 4.0 K/uL   Monocytes Relative 5 3 - 12 %   Monocytes Absolute 0.7 0.1 - 1.0 K/uL   Eosinophils Relative 1 0 - 5 %   Eosinophils Absolute 0.1 0.0 - 0.7 K/uL   Basophils Relative 0 0 - 1 %   Basophils Absolute 0.0 0.0 - 0.1 K/uL    Dg Femur Right  10/28/2014   CLINICAL DATA:  Slip and fall on rug in bathroom this morning. Right femur pain. Initial encounter.  EXAM: RIGHT FEMUR - 2 VIEW  COMPARISON:  Abdominal radiographs 10/21/2014  FINDINGS: Sequelae of prior right total hip arthroplasty are again identified. There is an acute transverse fracture of the right femoral shaft at the tip of the stem of the femoral prosthesis with slight varus angulation. There is approximately 1/2 shaft width lateral displacement and approximately 1 shaft width posterior displacement with significant overriding. Hip and knee are grossly located. No lytic  or blastic osseous lesion is seen. External traction device is in place.  IMPRESSION: Acute right femur fracture at tip of femoral prosthesis with displacement and overriding as above.   Electronically Signed   By: Logan Bores   On: 10/28/2014 15:46   Dg Chest Port 1 View  10/28/2014   CLINICAL DATA:  Fall with possible hip fracture. No chest complaints.  EXAM: PORTABLE CHEST - 1 VIEW  COMPARISON:  07/24/2014.  FINDINGS: Patient is rotated to the RIGHT and this exam has reverse lordotic projection, probably secondary to kyphosis. Aortic atherosclerosis. Cardiopericardial silhouette within normal limits. Monitoring leads project over the chest.  IMPRESSION: No active disease.   Electronically Signed   By: Dereck Ligas M.D.   On: 10/28/2014 16:34    ROS Blood pressure 95/58, pulse 92, temperature 98.2 F (36.8 C), temperature source Oral, resp. rate 24, height 4' 7.5" (1.41 m), weight 53.978 kg (119 lb), SpO2 97 %. Physical Exam  Musculoskeletal:       Right upper leg: She exhibits tenderness, bony tenderness, swelling and deformity.    Assessment/Plan: Right periprosthetic femur fracture at tip of right total hip prosthesis 1)  She will need a complex surgery involving her right femur late tomorrow afternoon.  This will require a large plate, cables, and screws.  She will then be made non-weight bearing for possibly up to 3 months to allows for healing.  She and her family understand this well as well as the fact that her numerus medical problems and osteoporotic bone create further risks.  She will need a medical consult as well prior to surgery for clearance and other recs regarding her care.  Mcarthur Rossetti 10/28/2014, 6:07 PM

## 2014-10-28 NOTE — ED Notes (Signed)
Ortho tech said traction device would be fine to leave in place for xray.

## 2014-10-28 NOTE — Progress Notes (Signed)
Orthopedic Tech Progress Note Patient Details:  Hannah Mckee 1931/08/01 [id number] Removed Hare traction splint and applied Buck's traction with ten pound weight to RLE.  Pt. stated immediate relief of pain after traction applied. Musculoskeletal Traction Type of Traction: Bucks Skin Traction Traction Location: RLE Traction Weight: 10 lbs    Darrol Poke 10/28/2014, 7:37 PM

## 2014-10-28 NOTE — ED Provider Notes (Signed)
CSN: [id number]     Arrival date & time 10/28/14  1337 History   First MD Initiated Contact with Patient 10/28/14 1343     Chief Complaint  Patient presents with  . Hip Injury     (Consider location/radiation/quality/duration/timing/severity/associated sxs/prior Treatment) HPI Patient presents to the emergency department following a fall that occurred just prior to arrival.  The patient states that she tripped over a little rug and fell, landing on her right side.  The patient states that she noticed an obvious deformity to her leg.  Patient states that she did not fall and hit her head and has no neck pain.  The patient denies chest pain, shortness of breath, nausea, vomiting, weakness, dizziness, headache, blurred vision, back pain, neck pain, abdominal pain, lightheadedness, near syncope or syncope.  The patient states that movement and palpation make the pain worse.  Patient was placed in traction by EMS Past Medical History  Diagnosis Date  . Hypertension   . Sepsis   . Arthritis   . Osteoarthritis   . CHF (congestive heart failure)    Past Surgical History  Procedure Laterality Date  . Tonsillectomy    . Cholecystectomy    . Total hip arthroplasty      1989  . Bunionectomy    . Lithotripsy    . Bilateral cataract surg    . Colonoscopy  11/16/2012    Procedure: COLONOSCOPY;  Surgeon: Rogene Houston, MD;  Location: [facility];  Service: Endoscopy;  Laterality: N/A;  100   History reviewed. No pertinent family history. History  Substance Use Topics  . Smoking status: Never Smoker   . Smokeless tobacco: Not on file  . Alcohol Use: No   OB History    No data available     Review of Systems  All other systems negative except as documented in the HPI. All pertinent positives and negatives as reviewed in the HPI.  Allergies  Erythromycin; Levaquin; Penicillins; and Sulfa antibiotics  Home Medications   Prior to Admission medications   Medication Sig Start Date  End Date Taking? Authorizing Provider  alendronate (FOSAMAX) 70 MG tablet Take 70 mg by mouth every 7 (seven) days. Take with a full glass of water on an empty stomach.   Yes Historical Provider, MD  aspirin 81 MG tablet Take 81 mg by mouth daily.   Yes Historical Provider, MD  benazepril (LOTENSIN) 20 MG tablet Take 20 mg by mouth 2 (two) times daily before a meal.   Yes Historical Provider, MD  calcium carbonate (OS-CAL) 600 MG TABS Take 600 mg by mouth daily.   Yes Historical Provider, MD  diazepam (VALIUM) 5 MG tablet Take 5 mg by mouth every 6 (six) hours as needed for anxiety. Anxiety.   Yes Historical Provider, MD  furosemide (LASIX) 40 MG tablet Take 40 mg by mouth daily as needed (blood pressure).    Yes Historical Provider, MD  Linaclotide (LINZESS) 145 MCG CAPS Take 1 capsule (145 mcg total) by mouth as needed. 04/16/13  Yes Butch Penny, NP  oxyCODONE-acetaminophen (PERCOCET) 10-325 MG per tablet Take 1 tablet by mouth every 4 (four) hours as needed for pain.    Yes Historical Provider, MD  polyethylene glycol (MIRALAX / GLYCOLAX) packet Take 17 g by mouth as needed for mild constipation.    Yes Historical Provider, MD  potassium chloride (KLOR-CON) 20 MEQ packet Take 20 mEq by mouth daily.    Yes Historical Provider, MD  promethazine (PHENERGAN)  25 MG tablet Take 25 mg by mouth every 6 (six) hours as needed for nausea.     Historical Provider, MD   BP 148/50 mmHg  Pulse 87  Temp(Src) 98.2 F (36.8 C) (Oral)  Resp 22  Ht 4' 7.5" (1.41 m)  Wt 119 lb (53.978 kg)  BMI 27.15 kg/m2  SpO2 95% Physical Exam  Constitutional: She is oriented to person, place, and time. She appears well-developed and well-nourished. No distress.  HENT:  Head: Normocephalic and atraumatic.  Mouth/Throat: Oropharynx is clear and moist.  Eyes: Pupils are equal, round, and reactive to light.  Neck: Normal range of motion. Neck supple.  Cardiovascular: Normal rate, regular rhythm and normal heart sounds.   Exam reveals no gallop and no friction rub.   No murmur heard. Pulmonary/Chest: Effort normal and breath sounds normal. No respiratory distress.  Musculoskeletal:       Right upper leg: She exhibits tenderness, bony tenderness and deformity. She exhibits no swelling, no edema and no laceration.       Legs: Neurological: She is alert and oriented to person, place, and time. She exhibits normal muscle tone. Coordination normal.  Skin: Skin is warm and dry. No erythema.  Nursing note and vitals reviewed.   ED Course  Procedures (including critical care time) Labs Review Labs Reviewed  BASIC METABOLIC PANEL - Abnormal; Notable for the following:    Glucose, Bld 101 (*)    GFR calc non Af Amer 80 (*)    All other components within normal limits  CBC WITH DIFFERENTIAL - Abnormal; Notable for the following:    WBC 13.8 (*)    Neutrophils Relative % 88 (*)    Neutro Abs 12.2 (*)    Lymphocytes Relative 6 (*)    All other components within normal limits  URINALYSIS, ROUTINE W REFLEX MICROSCOPIC    Imaging Review Dg Femur Right  10/28/2014   CLINICAL DATA:  Slip and fall on rug in bathroom this morning. Right femur pain. Initial encounter.  EXAM: RIGHT FEMUR - 2 VIEW  COMPARISON:  Abdominal radiographs 10/21/2014  FINDINGS: Sequelae of prior right total hip arthroplasty are again identified. There is an acute transverse fracture of the right femoral shaft at the tip of the stem of the femoral prosthesis with slight varus angulation. There is approximately 1/2 shaft width lateral displacement and approximately 1 shaft width posterior displacement with significant overriding. Hip and knee are grossly located. No lytic or blastic osseous lesion is seen. External traction device is in place.  IMPRESSION: Acute right femur fracture at tip of femoral prosthesis with displacement and overriding as above.   Electronically Signed   By: Logan Bores   On: 10/28/2014 15:46   Dg Chest Port 1  View  10/28/2014   CLINICAL DATA:  Fall with possible hip fracture. No chest complaints.  EXAM: PORTABLE CHEST - 1 VIEW  COMPARISON:  07/24/2014.  FINDINGS: Patient is rotated to the RIGHT and this exam has reverse lordotic projection, probably secondary to kyphosis. Aortic atherosclerosis. Cardiopericardial silhouette within normal limits. Monitoring leads project over the chest.  IMPRESSION: No active disease.   Electronically Signed   By: Dereck Ligas M.D.   On: 10/28/2014 16:34   I spoke with Dr. Ninfa Linden, of orthopedic surgery.  He states that he will see the patient most likely will not do surgery until tomorrow  MDM   Final diagnoses:  Fall  Femur fracture, right, closed, initial encounter  Brent General, PA-C 10/29/14 1531  Mariea Clonts, MD 10/30/14 [phone]

## 2014-10-28 NOTE — H&P (Signed)
Patient Demographics  Hannah Mckee, is a 80 y.o. female  MRN: [id number]   DOB - 27-Dec-1930  Admit Date - 10/28/2014  Outpatient Primary MD for the patient is VYAS,DHRUV B., MD   With History of -  Past Medical History  Diagnosis Date  . Hypertension   . Sepsis   . Arthritis   . Osteoarthritis   . CHF (congestive heart failure)   . COPD (chronic obstructive pulmonary disease)       Past Surgical History  Procedure Laterality Date  . Tonsillectomy    . Cholecystectomy    . Total hip arthroplasty      1989  . Bunionectomy    . Lithotripsy    . Bilateral cataract surg    . Colonoscopy  11/16/2012    Procedure: COLONOSCOPY;  Surgeon: Rogene Houston, MD;  Location: [facility];  Service: Endoscopy;  Laterality: N/A;  100    in for   Chief Complaint  Patient presents with  . Hip Injury     HPI  Hannah Mckee  is a 80 y.o. female, history of advanced COPD not on home oxygen but cannot ambulate more than 50 feet without getting short winded, chronic CHF unknown type no echogram in chart, osteoarthritis, hypertension, chronic pain, status post right hip replacement several years ago at [facility] who lives at home had trip and fall in her living room sustaining a right hip injury and pain. Came to the ER where she was diagnosed with right femoral shaft fracture, Dr. Ninfa Linden orthopedics was consulted he requested hospitalist team to admit the patient.   Patient currently denies any headache, no fever or chills, no chest pain palpitations, no shortness of breath at rest, no abdominal pain, no blood in stool or urine no focal weakness.  Review of Systems    In addition to the HPI above,  No Fever-chills, No Headache, No changes with Vision or hearing, No problems swallowing food or Liquids, No Chest  pain, Cough or Shortness of Breath, No Abdominal pain, No Nausea or Vommitting, Bowel movements are regular, No Blood in stool or Urine, No dysuria, No new skin rashes or bruises, No new joints pains-aches, except right hip pain radiating to her back at times, worse with movement better with rest. Ongoing for the last 4-5 hours. No new weakness, tingling, numbness in any extremity, No recent weight gain or loss, No polyuria, polydypsia or polyphagia, No significant Mental Stressors.  A full 10 point Review of Systems was done, except as stated above, all other Review of Systems were negative.   Social History History  Substance Use Topics  . Smoking status: Never Smoker   . Smokeless tobacco: Not on file  . Alcohol Use: No      Family History  No history of CAD   Prior to Admission medications   Medication Sig Start Date End Date Taking? Authorizing Provider  alendronate (FOSAMAX) 70 MG tablet Take 70  mg by mouth every 7 (seven) days. Take with a full glass of water on an empty stomach.   Yes Historical Provider, MD  aspirin 81 MG tablet Take 81 mg by mouth daily.   Yes Historical Provider, MD  benazepril (LOTENSIN) 20 MG tablet Take 20 mg by mouth 2 (two) times daily before a meal.   Yes Historical Provider, MD  calcium carbonate (OS-CAL) 600 MG TABS Take 600 mg by mouth daily.   Yes Historical Provider, MD  diazepam (VALIUM) 5 MG tablet Take 5 mg by mouth every 6 (six) hours as needed for anxiety. Anxiety.   Yes Historical Provider, MD  furosemide (LASIX) 40 MG tablet Take 40 mg by mouth daily as needed (blood pressure).    Yes Historical Provider, MD  Linaclotide (LINZESS) 145 MCG CAPS Take 1 capsule (145 mcg total) by mouth as needed. 04/16/13  Yes Butch Penny, NP  oxyCODONE-acetaminophen (PERCOCET) 10-325 MG per tablet Take 1 tablet by mouth every 4 (four) hours as needed for pain.    Yes Historical Provider, MD  polyethylene glycol (MIRALAX / GLYCOLAX) packet Take 17 g by  mouth as needed for mild constipation.    Yes Historical Provider, MD  potassium chloride (KLOR-CON) 20 MEQ packet Take 20 mEq by mouth daily.    Yes Historical Provider, MD  promethazine (PHENERGAN) 25 MG tablet Take 25 mg by mouth every 6 (six) hours as needed for nausea.     Historical Provider, MD    Allergies  Allergen Reactions  . Erythromycin Swelling  . Levaquin [Levofloxacin] Other (See Comments)    Tongue turns red and sore  . Penicillins Swelling  . Sulfa Antibiotics Other (See Comments)    Tongue turns red and sore    Physical Exam  Vitals  Blood pressure 95/58, pulse 92, temperature 98.2 F (36.8 C), temperature source Oral, resp. rate 24, height 4' 7.5" (1.41 m), weight 53.978 kg (119 lb), SpO2 97 %.   1. General elderly frail white female lying in bed in mild right-sided hip pain,  2. Normal affect and insight, Not Suicidal or Homicidal, Awake Alert, Oriented X 3.  3. No F.N deficits, ALL C.Nerves Intact, Strength 5/5 all 4 extremities, Sensation intact all 4 extremities, Plantars down going.  4. Ears and Eyes appear Normal, Conjunctivae clear, PERRLA. Moist Oral Mucosa.  5. Supple Neck, No JVD, No cervical lymphadenopathy appriciated, No Carotid Bruits.  6. Symmetrical Chest wall movement, moderate air movement bilaterally, CTAB.  7. RRR, No Gallops, Rubs or Murmurs, No Parasternal Heave.  8. Positive Bowel Sounds, Abdomen Soft, No tenderness, No organomegaly appriciated,No rebound -guarding or rigidity.  9.  No Cyanosis, Normal Skin Turgor, No Skin Rash or Bruise. Right leg in traction  10. Good muscle tone,  joints appear normal , no effusions, Normal ROM.  11. No Palpable Lymph Nodes in Neck or Axillae    Data Review  CBC  Recent Labs Lab 10/28/14 1503  WBC 13.8*  HGB 12.3  HCT 38.9  PLT 268  MCV 86.1  MCH 27.2  MCHC 31.6  RDW 13.9  LYMPHSABS 0.8  MONOABS 0.7  EOSABS 0.1  BASOSABS 0.0    ------------------------------------------------------------------------------------------------------------------  Chemistries   Recent Labs Lab 10/28/14 1503  NA 139  K 3.7  CL 105  CO2 28  GLUCOSE 101*  BUN 14  CREATININE 0.67  CALCIUM 9.3   ------------------------------------------------------------------------------------------------------------------ estimated creatinine clearance is 37.2 mL/min (by C-G formula based on Cr of 0.67). ------------------------------------------------------------------------------------------------------------------ No results for  input(s): TSH, T4TOTAL, T3FREE, THYROIDAB in the last 72 hours.  Invalid input(s): FREET3   Coagulation profile No results for input(s): INR, PROTIME in the last 168 hours. ------------------------------------------------------------------------------------------------------------------- No results for input(s): DDIMER in the last 72 hours. -------------------------------------------------------------------------------------------------------------------  Cardiac Enzymes No results for input(s): CKMB, TROPONINI, MYOGLOBIN in the last 168 hours.  Invalid input(s): CK ------------------------------------------------------------------------------------------------------------------ Invalid input(s): POCBNP   ---------------------------------------------------------------------------------------------------------------  Urinalysis No results found for: COLORURINE, APPEARANCEUR, [place], [place], [place], [place], BILIRUBINUR, [place], PROTEINUR, UROBILINOGEN, NITRITE, LEUKOCYTESUR  ----------------------------------------------------------------------------------------------------------------  Imaging results:   Dg Femur Right  10/28/2014   CLINICAL DATA:  Slip and fall on rug in bathroom this morning. Right femur pain. Initial encounter.  EXAM: RIGHT FEMUR - 2 VIEW  COMPARISON:  Abdominal radiographs  10/21/2014  FINDINGS: Sequelae of prior right total hip arthroplasty are again identified. There is an acute transverse fracture of the right femoral shaft at the tip of the stem of the femoral prosthesis with slight varus angulation. There is approximately 1/2 shaft width lateral displacement and approximately 1 shaft width posterior displacement with significant overriding. Hip and knee are grossly located. No lytic or blastic osseous lesion is seen. External traction device is in place.  IMPRESSION: Acute right femur fracture at tip of femoral prosthesis with displacement and overriding as above.   Electronically Signed   By: Logan Bores   On: 10/28/2014 15:46   Dg Chest Port 1 View  10/28/2014   CLINICAL DATA:  Fall with possible hip fracture. No chest complaints.  EXAM: PORTABLE CHEST - 1 VIEW  COMPARISON:  07/24/2014.  FINDINGS: Patient is rotated to the RIGHT and this exam has reverse lordotic projection, probably secondary to kyphosis. Aortic atherosclerosis. Cardiopericardial silhouette within normal limits. Monitoring leads project over the chest.  IMPRESSION: No active disease.   Electronically Signed   By: Dereck Ligas M.D.   On: 10/28/2014 16:34    My personal review of EKG: Rhythm NSR with poor baseline, Rate  94 /min,  no Acute ST changes- KG confirm with cardiologist on-call Dr Sallyanne Kuster    Assessment & Plan   1. Mechanical fall with right femoral fracture. Orthopedics surgeon Dr. Ninfa Linden has been consulted, likely surgery early in the morning, nothing by mouth after midnight.  Cardio-Pulm Risk stratification for surgery and recommendations to minimize the same:-  A.Cardio-Pulmonary Risk -  this patient is a high risk for adverse Cardio-Pulmonary  Outcome  from surgery due to advanced underlying COPD and to her function status, the risks and benefits were discussed and acceptable to the patient and family.  Recommendations for optimizing Cardio-Pulmonary  Risk risk  factors  1. Keep SBP<140, HR<85, use hydralazine 10 mg IV q6hrs PRN, or B.Blocker drip PRN. Will place on scheduled low-dose bisoprolol. 2. Moniotr I&Os. 3. Minimal sedation and Narcotics. 4. Good pulmunary toilet. 5. PRN Nebs and as needed oxygen to keep Pox>90% 6. Hb>8, transfuse as needed- Lasix 10mg  IV after each unit PRBC Transfused.   B.Bleeding Risk - no previous surgical complications, no easy bruising,  Antiplate meds  ASA 81 mg daily, INR pending.   Lab Results  Component Value Date   PLT 268 10/28/2014                 No results found for: INR, PROTIME    Will request Surgeon to please Order DVT prophylaxis of his/her choice, along with activity, weight bearing precautions and diet if appropriate.      2. Advanced COPD. Have added scheduled and as  needed nebulizer treatments, oxygen supplementation as needed, does not use home oxygen but poor functional status due to shortness of breath with minimal exertion, good pulmonary toiletry, flutter valve, minimal sedation.   3. Advanced kyphosis. Note patient wishes to keep 2 towels rolled below her neck at all times. Currently continue at all times except while she is in surgery and intubated.  4. Osteoporosis. Continue calcium supplementation.   5. Essential hypertension. Cut back her ACE inhibitor dose due to pending surgery, added low-dose bisoprolol, she uses when necessary Lasix. Monitor intake and output and fluid status closely.   6. Chronic pain. Supportive care.    7. Chronic nonspecific CHF. No echogram in chart. Appears compensated, continue ACE inhibitor low-dose beta blocker, monitor intake and output closely, daily weight, monitor fluid status closely, try to keep patient even use as needed Lasix which she uses at home.    DVT Prophylaxis SCDs    AM Labs Ordered, also please review Full Orders  Family Communication: Admission, patients condition and plan of care including tests being ordered have been  discussed with the patient and family who indicate understanding and agree with the plan and Code Status.  Code Status Full  Likely DC to  Likely SNF  Condition GUARDED     Time spent in minutes : 45    Oneita Allmon K M.D on 10/28/2014 at 6:08 PM  Between 7am to 7pm - Pager - [phone]  After 7pm go to [email or web address] - [facility] Office  [phone]

## 2014-10-28 NOTE — ED Notes (Signed)
Pt here for deformity to right hip s/p slip and fall on rug and landed on hip. Hx of hip replacement to same him in past.

## 2014-10-28 NOTE — Progress Notes (Signed)
Orthopedic Tech Progress Note Patient Details:  LUWANDA STARR [birth year] [phone] Nurse "Cecille Rubin" from [facility] ED called and asked advice about Hare traction.  She stated that they had an order for Buck's traction.  I explained to her that it would be better for the patient if we waited to switch from [facility] traction to Buck's traction until after the patient got to her inpatient room because if we waited, her leg would only have to be manipulated once. I had also explained this to nurse "Gaspar Bidding" earlier.  "Cecille Rubin" then asked how long the Hare traction could be left in place, because the pt's admission might be delayed.  I stated that it could be left in place for up to 12 hours if absolutely necessary, but that it should be switched to Buck's as soon as practical, preferably when the patient is admitted.  Pt. was tolerating splint very well when I was consulted prior to her x-rays.  I will check on pt. within the hour of this note to see if pt. Is still tolerating the Hare traction.  If she is uncomfortable, I will immediately remove the Hare splint and replace the traction with Buck's device. Patient ID: ARLET MARTER, female   DOB: 30-Dec-1930, 80 y.o.   MRN: [id number]   Darrol Poke 10/28/2014, 5:49 PM

## 2014-10-28 NOTE — ED Notes (Signed)
Called ortho about patient's splint to come look at it for xray.

## 2014-10-28 NOTE — ED Notes (Signed)
Spoke with Ortho Arville Lime about Animal nutritionist.  Carloyn Manner stated, "They will wait unitl 20:00 before applying to see if pt. Is moved to her Room.  Carloyn Manner stated, It is safe to leave the hairpin traction on for 12 hours."  Relayed this information to [employer], [place]. He verbalized understanding.

## 2014-10-29 ENCOUNTER — Inpatient Hospital Stay (HOSPITAL_COMMUNITY): Payer: Medicare Other | Admitting: Certified Registered Nurse Anesthetist

## 2014-10-29 ENCOUNTER — Encounter (HOSPITAL_COMMUNITY)
Admission: EM | Disposition: A | Discharge status: Skilled Nursing Facility | Payer: Self-pay | Source: Home / Self Care | Attending: Internal Medicine

## 2014-10-29 ENCOUNTER — Encounter (HOSPITAL_COMMUNITY): Payer: Self-pay | Admitting: Certified Registered Nurse Anesthetist

## 2014-10-29 ENCOUNTER — Inpatient Hospital Stay (HOSPITAL_COMMUNITY): Payer: Medicare Other

## 2014-10-29 DIAGNOSIS — I10 Essential (primary) hypertension: Secondary | ICD-10-CM

## 2014-10-29 DIAGNOSIS — J438 Other emphysema: Secondary | ICD-10-CM

## 2014-10-29 HISTORY — PX: ORIF PERIPROSTHETIC FRACTURE: SHX5034

## 2014-10-29 LAB — BASIC METABOLIC PANEL
Anion gap: 6 (ref 5–15)
BUN: 12 mg/dL (ref 6–23)
CHLORIDE: 102 meq/L (ref 96–112)
CO2: 27 mmol/L (ref 19–32)
Calcium: 8.7 mg/dL (ref 8.4–10.5)
Creatinine, Ser: 0.67 mg/dL (ref 0.50–1.10)
GFR calc Af Amer: >90 mL/min (ref >90)
GFR, EST NON AFRICAN AMERICAN: 80 mL/min — AB (ref >90)
GLUCOSE: 116 mg/dL — AB (ref 70–99)
Potassium: 3.6 mmol/L (ref 3.5–5.1)
Sodium: 135 mmol/L (ref 135–145)

## 2014-10-29 LAB — CBC
HCT: 34.2 % — ABNORMAL LOW (ref 36.0–46.0)
HEMOGLOBIN: 10.6 g/dL — AB (ref 12.0–15.0)
MCH: 27.1 pg (ref 26.0–34.0)
MCHC: 31 g/dL (ref 30.0–36.0)
MCV: 87.5 fL (ref 78.0–100.0)
Platelets: 250 10*3/uL (ref 150–400)
RBC: 3.91 MIL/uL (ref 3.87–5.11)
RDW: 14.3 % (ref 11.5–15.5)
WBC: 9.5 10*3/uL (ref 4.0–10.5)

## 2014-10-29 LAB — SURGICAL PCR SCREEN
MRSA, PCR: NEGATIVE
Staphylococcus aureus: NEGATIVE

## 2014-10-29 SURGERY — OPEN REDUCTION INTERNAL FIXATION (ORIF) PERIPROSTHETIC FRACTURE
Anesthesia: General | Laterality: Right

## 2014-10-29 MED ORDER — LACTATED RINGERS IV SOLN
INTRAVENOUS | Status: DC | PRN
  Administered 2014-10-29: 15:00:00 via INTRAVENOUS

## 2014-10-29 MED ORDER — CLINDAMYCIN PHOSPHATE 900 MG/50ML IV SOLN
900.0000 mg | INTRAVENOUS | Status: DC
  Filled 2014-10-29: qty 50

## 2014-10-29 MED ORDER — PROPOFOL 10 MG/ML IV BOLUS
INTRAVENOUS | Status: DC | PRN
  Administered 2014-10-29: 20 mg via INTRAVENOUS

## 2014-10-29 MED ORDER — FENTANYL CITRATE 0.05 MG/ML IJ SOLN
INTRAMUSCULAR | Status: AC
  Filled 2014-10-29: qty 5

## 2014-10-29 MED ORDER — ONDANSETRON HCL 4 MG/2ML IJ SOLN: 4.0000 mg | Freq: Once | INTRAMUSCULAR | Status: DC | PRN

## 2014-10-29 MED ORDER — VANCOMYCIN HCL IN DEXTROSE 1-5 GM/200ML-% IV SOLN
1000.0000 mg | Freq: Two times a day (BID) | INTRAVENOUS | Status: AC
  Administered 2014-10-30: 1000 mg via INTRAVENOUS
  Filled 2014-10-29: qty 200

## 2014-10-29 MED ORDER — METOCLOPRAMIDE HCL 5 MG/ML IJ SOLN: 5.0000 mg | Freq: Three times a day (TID) | INTRAMUSCULAR | Status: DC | PRN

## 2014-10-29 MED ORDER — LACTATED RINGERS IV SOLN: INTRAVENOUS | Status: DC | PRN

## 2014-10-29 MED ORDER — METHOCARBAMOL 500 MG PO TABS
500.0000 mg | ORAL_TABLET | Freq: Four times a day (QID) | ORAL | Status: DC | PRN
  Administered 2014-10-29 – 2014-10-30 (×2): 500 mg via ORAL
  Filled 2014-10-29 (×2): qty 1

## 2014-10-29 MED ORDER — ONDANSETRON HCL 4 MG/2ML IJ SOLN
INTRAMUSCULAR | Status: DC | PRN
  Administered 2014-10-29: 4 mg via INTRAVENOUS

## 2014-10-29 MED ORDER — SODIUM CHLORIDE 0.9 % IR SOLN
Status: DC | PRN
  Administered 2014-10-29: 1000 mL

## 2014-10-29 MED ORDER — FENTANYL CITRATE 0.05 MG/ML IJ SOLN
INTRAMUSCULAR | Status: AC
  Administered 2014-10-29: 25 ug via INTRAVENOUS
  Filled 2014-10-29: qty 2

## 2014-10-29 MED ORDER — CLINDAMYCIN PHOSPHATE 900 MG/50ML IV SOLN
INTRAVENOUS | Status: DC | PRN
  Administered 2014-10-29: 900 mg via INTRAVENOUS

## 2014-10-29 MED ORDER — SODIUM CHLORIDE 0.9 % IV SOLN
INTRAVENOUS | Status: DC
  Administered 2014-10-29: 23:00:00 via INTRAVENOUS

## 2014-10-29 MED ORDER — ONDANSETRON HCL 4 MG/2ML IJ SOLN: 4.0000 mg | Freq: Four times a day (QID) | INTRAMUSCULAR | Status: DC | PRN

## 2014-10-29 MED ORDER — ONDANSETRON HCL 4 MG PO TABS: 4.0000 mg | ORAL_TABLET | Freq: Four times a day (QID) | ORAL | Status: DC | PRN

## 2014-10-29 MED ORDER — FENTANYL CITRATE 0.05 MG/ML IJ SOLN
25.0000 ug | INTRAMUSCULAR | Status: DC | PRN
  Administered 2014-10-29 (×4): 25 ug via INTRAVENOUS

## 2014-10-29 MED ORDER — WARFARIN - PHARMACIST DOSING INPATIENT: Freq: Every day | Status: DC

## 2014-10-29 MED ORDER — PHENOL 1.4 % MT LIQD: 1.0000 | OROMUCOSAL | Status: DC | PRN

## 2014-10-29 MED ORDER — MORPHINE SULFATE 2 MG/ML IJ SOLN
0.5000 mg | INTRAMUSCULAR | Status: DC | PRN
Start: 2014-10-29 — End: 2014-10-31
  Administered 2014-10-29: 0.5 mg via INTRAVENOUS
  Filled 2014-10-29: qty 1

## 2014-10-29 MED ORDER — FENTANYL CITRATE 0.05 MG/ML IJ SOLN
INTRAMUSCULAR | Status: DC | PRN
  Administered 2014-10-29: 50 ug via INTRAVENOUS
  Administered 2014-10-29 (×3): 25 ug via INTRAVENOUS
  Administered 2014-10-29: 100 ug via INTRAVENOUS
  Administered 2014-10-29: 25 ug via INTRAVENOUS

## 2014-10-29 MED ORDER — PHENYLEPHRINE HCL 10 MG/ML IJ SOLN
10.0000 mg | INTRAVENOUS | Status: DC | PRN
  Administered 2014-10-29: 25 ug/min via INTRAVENOUS

## 2014-10-29 MED ORDER — METOCLOPRAMIDE HCL 10 MG PO TABS: 5.0000 mg | ORAL_TABLET | Freq: Three times a day (TID) | ORAL | Status: DC | PRN

## 2014-10-29 MED ORDER — PHENYLEPHRINE HCL 10 MG/ML IJ SOLN
INTRAMUSCULAR | Status: DC | PRN
  Administered 2014-10-29 (×3): 80 ug via INTRAVENOUS

## 2014-10-29 MED ORDER — WARFARIN SODIUM 2.5 MG PO TABS
2.5000 mg | ORAL_TABLET | Freq: Once | ORAL | Status: AC
  Administered 2014-10-29: 2.5 mg via ORAL
  Filled 2014-10-29: qty 1

## 2014-10-29 MED ORDER — WARFARIN VIDEO
Freq: Once | Status: AC
  Administered 2014-10-29: 23:00:00

## 2014-10-29 MED ORDER — LACTATED RINGERS IV SOLN
INTRAVENOUS | Status: DC
  Administered 2014-10-29: 15:00:00 via INTRAVENOUS

## 2014-10-29 MED ORDER — METHOCARBAMOL 1000 MG/10ML IJ SOLN
500.0000 mg | Freq: Four times a day (QID) | INTRAVENOUS | Status: DC | PRN
  Filled 2014-10-29: qty 5

## 2014-10-29 MED ORDER — PROPOFOL 10 MG/ML IV BOLUS
INTRAVENOUS | Status: AC
  Filled 2014-10-29: qty 20

## 2014-10-29 MED ORDER — ACETAMINOPHEN 325 MG PO TABS: 650.0000 mg | ORAL_TABLET | Freq: Four times a day (QID) | ORAL | Status: DC | PRN

## 2014-10-29 MED ORDER — PROPOFOL INFUSION 10 MG/ML OPTIME
INTRAVENOUS | Status: DC | PRN
  Administered 2014-10-29: 50 ug/kg/min via INTRAVENOUS

## 2014-10-29 MED ORDER — ACETAMINOPHEN 650 MG RE SUPP: 650.0000 mg | Freq: Four times a day (QID) | RECTAL | Status: DC | PRN

## 2014-10-29 MED ORDER — HYDROCODONE-ACETAMINOPHEN 5-325 MG PO TABS
1.0000 | ORAL_TABLET | Freq: Four times a day (QID) | ORAL | Status: DC | PRN
  Administered 2014-10-31: 2 via ORAL
  Filled 2014-10-29: qty 2

## 2014-10-29 MED ORDER — COUMADIN BOOK
Freq: Once | Status: AC
  Administered 2014-10-29: 23:00:00
  Filled 2014-10-29: qty 1

## 2014-10-29 MED ORDER — MENTHOL 3 MG MT LOZG: 1.0000 | LOZENGE | OROMUCOSAL | Status: DC | PRN

## 2014-10-29 SURGICAL SUPPLY — 51 items
BIT DRILL 4.3 (BIT) ×1 IMPLANT
CABLE (Orthopedic Implant) ×6 IMPLANT
CABLE CERLAGE W/CRIMP 1.8 (Cable) ×8 IMPLANT
CABLE CERLAGE W/CRIMP 1.8MM (Cable) ×4 IMPLANT
CAP LOCK NCB (Cap) ×6 IMPLANT
DRAPE C-ARM 42X72 X-RAY (DRAPES) ×3 IMPLANT
DRAPE C-ARMOR (DRAPES) ×3 IMPLANT
DRAPE IMP U-DRAPE 54X76 (DRAPES) ×3 IMPLANT
DRAPE INCISE IOBAN 66X45 STRL (DRAPES) ×3 IMPLANT
DRAPE ORTHO SPLIT 77X108 STRL (DRAPES) ×4
DRAPE SURG ORHT 6 SPLT 77X108 (DRAPES) ×2 IMPLANT
DRILL BIT 4.3 (BIT) ×2
DRSG AQUACEL AG ADV 3.5X10 (GAUZE/BANDAGES/DRESSINGS) ×3 IMPLANT
DURAPREP 26ML APPLICATOR (WOUND CARE) ×3 IMPLANT
ELECT REM PT RETURN 9FT ADLT (ELECTROSURGICAL) ×3
ELECTRODE REM PT RTRN 9FT ADLT (ELECTROSURGICAL) ×1 IMPLANT
FACESHIELD STD STERILE (MASK) ×3 IMPLANT
GLOVE BIO SURGEON STRL SZ8 (GLOVE) ×6 IMPLANT
GLOVE BIO SURGEON STRL SZ8.5 (GLOVE) ×3 IMPLANT
GLOVE BIOGEL PI IND STRL 6.5 (GLOVE) ×2 IMPLANT
GLOVE BIOGEL PI INDICATOR 6.5 (GLOVE) ×4
GLOVE SURG SS PI 6.5 STRL IVOR (GLOVE) ×3 IMPLANT
GOWN STRL REUS W/ TWL LRG LVL3 (GOWN DISPOSABLE) ×3 IMPLANT
GOWN STRL REUS W/ TWL XL LVL3 (GOWN DISPOSABLE) ×2 IMPLANT
GOWN STRL REUS W/TWL LRG LVL3 (GOWN DISPOSABLE) ×6
GOWN STRL REUS W/TWL XL LVL3 (GOWN DISPOSABLE) ×4
KIT BASIN OR (CUSTOM PROCEDURE TRAY) ×3 IMPLANT
KIT ROOM TURNOVER OR (KITS) ×3 IMPLANT
LOCKPLATE CABLE BUTTON NCP HIP (Orthopedic Implant) ×15 IMPLANT
MANIFOLD NEPTUNE II (INSTRUMENTS) ×3 IMPLANT
NCP locking cap (Cap) ×9 IMPLANT
NS IRRIG 1000ML POUR BTL (IV SOLUTION) ×6 IMPLANT
PACK TOTAL JOINT (CUSTOM PROCEDURE TRAY) ×3 IMPLANT
PAD ARMBOARD 7.5X6 YLW CONV (MISCELLANEOUS) ×6 IMPLANT
PLATE NCB 10H CURVED FEMUR (Plate) ×3 IMPLANT
SCREW 5.0 32MM (Screw) ×3 IMPLANT
SCREW NCB 5.0X28MM (Screw) ×3 IMPLANT
SCREW NCB 5.0X30MM (Screw) ×3 IMPLANT
SCREW NCB 5.0X34MM (Screw) ×3 IMPLANT
SCREW NCB 5.0X36MM (Screw) ×3 IMPLANT
STAPLER VISISTAT 35W (STAPLE) ×3 IMPLANT
SUT VIC AB 0 CT1 27 (SUTURE) ×6
SUT VIC AB 0 CT1 27XBRD ANBCTR (SUTURE) ×3 IMPLANT
SUT VIC AB 1 CT1 27 (SUTURE) ×6
SUT VIC AB 1 CT1 27XBRD ANBCTR (SUTURE) ×3 IMPLANT
SUT VIC AB 2-0 CT1 27 (SUTURE) ×6
SUT VIC AB 2-0 CT1 TAPERPNT 27 (SUTURE) ×3 IMPLANT
TOWEL OR 17X24 6PK STRL BLUE (TOWEL DISPOSABLE) ×3 IMPLANT
TOWEL OR 17X26 10 PK STRL BLUE (TOWEL DISPOSABLE) ×3 IMPLANT
TRAY FOLEY BAG SILVER LF 14FR (CATHETERS) ×3 IMPLANT
WATER STERILE IRR 1000ML POUR (IV SOLUTION) ×3 IMPLANT

## 2014-10-29 NOTE — Anesthesia Procedure Notes (Signed)
Spinal Patient location during procedure: OR Start time: 10/29/2014 4:35 PM End time: 10/29/2014 4:30 PM Staffing Performed by: anesthesiologist  Preanesthetic Checklist Completed: patient identified, site marked, surgical consent, pre-op evaluation, timeout performed, IV checked, risks and benefits discussed and monitors and equipment checked Spinal Block Patient position: right lateral decubitus Prep: ChloraPrep Patient monitoring: heart rate, cardiac monitor, continuous pulse ox and blood pressure Approach: right paramedian Location: L4-5 Injection technique: single-shot Needle Needle type: Tuohy  Needle gauge: 22 G Needle length: 9 cm Needle insertion depth: 5 cm Assessment Sensory level: T10 Additional Notes 7.5 mg 0.755 Marcaine injected easily

## 2014-10-29 NOTE — Anesthesia Preprocedure Evaluation (Addendum)
Anesthesia Evaluation  Patient identified by MRN, date of birth, ID band Patient awake    Reviewed: Allergy & Precautions, H&P , NPO status , Patient's Chart, lab work & pertinent test results  Airway Mallampati: II  TM Distance: >3 FB Neck ROM: Limited    Dental  (+) Edentulous Upper, Edentulous Lower   Pulmonary COPD COPD inhaler,    + decreased breath sounds      Cardiovascular hypertension, Rhythm:Regular Rate:Normal     Neuro/Psych    GI/Hepatic   Endo/Other    Renal/GU      Musculoskeletal   Abdominal   Peds  Hematology   Anesthesia Other Findings   Reproductive/Obstetrics                           Anesthesia Physical Anesthesia Plan  ASA: III  Anesthesia Plan: General   Post-op Pain Management:    Induction: Intravenous  Airway Management Planned: Oral ETT  Additional Equipment:   Intra-op Plan:   Post-operative Plan: Extubation in OR  Informed Consent: I have reviewed the patients History and Physical, chart, labs and discussed the procedure including the risks, benefits and alternatives for the proposed anesthesia with the patient or authorized representative who has indicated his/her understanding and acceptance.     Plan Discussed with: Anesthesiologist, Surgeon and CRNA  Anesthesia Plan Comments:        Anesthesia Quick Evaluation

## 2014-10-29 NOTE — Progress Notes (Signed)
ANTICOAGULATION CONSULT NOTE - Initial Consult  Pharmacy Consult for Coumadin Indication: VTE prophylaxis  Allergies  Allergen Reactions  . Erythromycin Swelling  . Levaquin [Levofloxacin] Other (See Comments)    Tongue turns red and sore  . Penicillins Swelling  . Sulfa Antibiotics Other (See Comments)    Tongue turns red and sore    Patient Measurements: Height: 4' 7.5" (141 cm) Weight: 154 lb 12.2 oz (70.2 kg) (10lb bucks traction & bar) IBW/kg (Calculated) : 35.15  Vital Signs: Temp: 98.1 F (36.7 C) (12/29 2030) BP: 124/51 mmHg (12/29 2030) Pulse Rate: 64 (12/29 2030)  Labs:  Recent Labs  10/28/14 1503 10/28/14 2035 10/29/14 0555  HGB 12.3  --  10.6*  HCT 38.9  --  34.2*  PLT 268  --  250  LABPROT  --  14.4  --   INR  --  1.10  --   CREATININE 0.67  --  0.67    Estimated Creatinine Clearance: 42.8 mL/min (by C-G formula based on Cr of 0.67).   Medical History: Past Medical History  Diagnosis Date  . Hypertension   . Sepsis   . Arthritis   . Osteoarthritis   . CHF (congestive heart failure)   . COPD (chronic obstructive pulmonary disease)     Assessment: 55 YOF with femur fracture, s/p ORIF. Pharmacy is consulted to start coumadin for VTE prophylaxis. Baseline INR 1.1, no anticoagulation PTA. Hgb 10.6, plt 250  Goal of Therapy:  INR 2-3 Monitor platelets by anticoagulation protocol: Yes   Plan:  Coumadin 2.5 mg po x 1 Daily PT/INR Coumadin book and video  Maryanna Shape, PharmD, BCPS  Clinical Pharmacist  Pager: [phone]   10/29/2014,9:20 PM

## 2014-10-29 NOTE — Progress Notes (Signed)
[facility] PROGRESS NOTE  Hannah Mckee TDV:[id number] DOB: [birth year] DOA: 10/28/2014 PCP: Glenda Chroman., MD  Assessment/Plan: 1. Right femoral fracture. -Patient having a mechanical fall at home, denied symptoms prior to event. -Initial imaging studies showed an acute right femoral fracture at tip of femoral prosthesis with displacement and significant overriding -Orthopedic surgery was consulted, traction device placed -He was taken to the operating room on 10/29/2014 for ORIF. -Will likely need rehabilitation at skilled nursing facility  2.  Essential hypertension. -Patient having soft blood pressures this morning likely resulting from narcotic analgesics, ACE inhibitor therapy was discontinued -Continue beta blocker  3.  History of chronic obstructive pulmonary disease -Patient does not appear to have acute pulmonary issues  4.  Pain management -Has Vicodin 1-2 tablets by mouth every 4 hours available for pain with IV Dilaudid for severe breakthrough pain  Code Status: Full code Family Communication: Spoke with her daughter was present at bedside Disposition Plan: Patient to be taken to the operating room today to undergo ORIF, suspect will require skilled nursing facility placement for rehabilitation   Consultants:  Orthopedic surgery  Procedures:  ORIF of right femoral fracture performed on 10/29/2014   HPI/Subjective: Patient is a pleasant [age]-year-old female with a past medical history of chronic obstructive pulmonary disease,RSD, type 2 diabetes mellitus who was admitted to the medicine service on 10/28/2014. She had a mechanical fall at home, unable to bear weight, having intense pain and brought to the emergency room at [facility]. Patient reporting losing her balance while turning. She denied dizziness, lightheaded as, chest pain, short of breath, palpitations prior to event. Imaging studies performed on admission showed an acute right femoral  fracture at tip of femoral prosthesis with displacement and overriding. Orthopedic surgery was consulted as she was taken to the OR on 10/29/2014.   Objective: Filed Vitals:   10/29/14 0452  BP: 126/42  Pulse: 68  Temp:   Resp: 20    Intake/Output Summary (Last 24 hours) at 10/29/14 1711 Last data filed at 10/29/14 1404  Gross per 24 hour  Intake      3 ml  Output    700 ml  Net   -697 ml   Filed Weights   10/28/14 1343 10/29/14 0600  Weight: 53.978 kg (119 lb) 70.2 kg (154 lb 12.2 oz)    Exam:   General:  Patient is awake, alert, no acute distress  Cardiovascular: Regular rate and rhythm normal S1-S2 no murmurs rubs or gallops  Respiratory: Clear to auscultation bilaterally no wheezing rhonchi or rales  Abdomen: Soft nontender nontender positive bowel sounds  Musculoskeletal: Traction device placed to her right lower extremity  Data Reviewed: Basic Metabolic Panel:  Recent Labs Lab 10/28/14 1503 10/29/14 0555  NA 139 135  K 3.7 3.6  CL 105 102  CO2 28 27  GLUCOSE 101* 116*  BUN 14 12  CREATININE 0.67 0.67  CALCIUM 9.3 8.7   Liver Function Tests: No results for input(s): AST, ALT, ALKPHOS, BILITOT, PROT, ALBUMIN in the last 168 hours. No results for input(s): LIPASE, AMYLASE in the last 168 hours. No results for input(s): AMMONIA in the last 168 hours. CBC:  Recent Labs Lab 10/28/14 1503 10/29/14 0555  WBC 13.8* 9.5  NEUTROABS 12.2*  --   HGB 12.3 10.6*  HCT 38.9 34.2*  MCV 86.1 87.5  PLT 268 250   Cardiac Enzymes: No results for input(s): CKTOTAL, CKMB, CKMBINDEX, TROPONINI in the last 168 hours. BNP (last 3 results)  No results for input(s): PROBNP in the last 8760 hours. CBG: No results for input(s): GLUCAP in the last 168 hours.  Recent Results (from the past 240 hour(s))  Surgical PCR screen     Status: None   Collection Time: 10/29/14  1:34 PM  Result Value Ref Range Status   MRSA, PCR NEGATIVE NEGATIVE Final   Staphylococcus  aureus NEGATIVE NEGATIVE Final    Comment:        The Xpert SA Assay (FDA approved for NASAL specimens in patients over 69 years of age), is one component of a comprehensive surveillance program.  Test performance has been validated by [employer] for patients greater than or equal to 58 year old. It is not intended to diagnose infection nor to guide or monitor treatment.      Studies: Dg Femur Right  10/28/2014   CLINICAL DATA:  Slip and fall on rug in bathroom this morning. Right femur pain. Initial encounter.  EXAM: RIGHT FEMUR - 2 VIEW  COMPARISON:  Abdominal radiographs 10/21/2014  FINDINGS: Sequelae of prior right total hip arthroplasty are again identified. There is an acute transverse fracture of the right femoral shaft at the tip of the stem of the femoral prosthesis with slight varus angulation. There is approximately 1/2 shaft width lateral displacement and approximately 1 shaft width posterior displacement with significant overriding. Hip and knee are grossly located. No lytic or blastic osseous lesion is seen. External traction device is in place.  IMPRESSION: Acute right femur fracture at tip of femoral prosthesis with displacement and overriding as above.   Electronically Signed   By: Logan Bores   On: 10/28/2014 15:46   Dg Chest Port 1 View  10/28/2014   CLINICAL DATA:  Fall with possible hip fracture. No chest complaints.  EXAM: PORTABLE CHEST - 1 VIEW  COMPARISON:  07/24/2014.  FINDINGS: Patient is rotated to the RIGHT and this exam has reverse lordotic projection, probably secondary to kyphosis. Aortic atherosclerosis. Cardiopericardial silhouette within normal limits. Monitoring leads project over the chest.  IMPRESSION: No active disease.   Electronically Signed   By: Dereck Ligas M.D.   On: 10/28/2014 16:34    Scheduled Meds: . [MAR Hold] aspirin EC  81 mg Oral Daily  . [MAR Hold] bisoprolol  5 mg Oral Daily  . [MAR Hold] calcium carbonate  1 tablet Oral Q  breakfast  . clindamycin (CLEOCIN) IV  900 mg Intravenous To OR  . [MAR Hold] Linaclotide  145 mcg Oral Daily  . [MAR Hold] polyethylene glycol  17 g Oral BID  . [MAR Hold] sodium chloride  3 mL Intravenous Q12H   Continuous Infusions: . dextrose 5 % and 0.45% NaCl 50 mL/hr at 10/28/14 2206  . lactated ringers 50 mL/hr at 10/29/14 1515    Principal Problem:   Right femoral fracture Active Problems:   Hypertension   Diverticulitis of colon without hemorrhage   COPD (chronic obstructive pulmonary disease)   Femur fracture   Congestive heart disease    Time spent: 30 min    Kelvin Cellar  [facility] Pager [phone]. If 7PM-7AM, please contact night-coverage at [email or web address], password [facility] 10/29/2014, 5:11 PM  LOS: 1 day

## 2014-10-29 NOTE — Brief Op Note (Signed)
10/28/2014 - 10/29/2014  6:46 PM  PATIENT:  Hannah Mckee  80 y.o. female  PRE-OPERATIVE DIAGNOSIS:  Right Periprosthetic  femur fx  POST-OPERATIVE DIAGNOSIS:  Right Periprosthetic  femur fx  PROCEDURE:  Procedure(s): OPEN REDUCTION INTERNAL FIXATION (ORIF) PERIPROSTHETIC FEMUR FRACTURE/FEMUR SHAFT (Right)  SURGEON:  Surgeon(s) and Role:    * Mcarthur Rossetti, MD - Primary  PHYSICIAN ASSISTANT: Benita Stabile, PA-C  ANESTHESIA:   spinal  EBL:  Total I/O In: -  Out: 250 [Urine:250]  BLOOD ADMINISTERED:none  DRAINS: none   LOCAL MEDICATIONS USED:  NONE  SPECIMEN:  No Specimen  DISPOSITION OF SPECIMEN:  N/A  COUNTS:  YES  TOURNIQUET:  * No tourniquets in log *  DICTATION: .Other Dictation: Dictation Number [id number]  PLAN OF CARE: Admit to inpatient   PATIENT DISPOSITION:  PACU - hemodynamically stable.   Delay start of Pharmacological VTE agent (>24hrs) due to surgical blood loss or risk of bleeding: no

## 2014-10-29 NOTE — Progress Notes (Signed)
Patient ID: Hannah Mckee, female   DOB: 27-Feb-1931, 80 y.o.   MRN: [id number] Awake and alert.  Slightly more comfortable in traction of her right leg.  She and her family understand that surgery will be later this afternoon to address her right periprosthetic femur fracture.

## 2014-10-29 NOTE — Transfer of Care (Signed)
Immediate Anesthesia Transfer of Care Note  Patient: Hannah Mckee  Procedure(s) Performed: Procedure(s): OPEN REDUCTION INTERNAL FIXATION (ORIF) PERIPROSTHETIC FEMUR FRACTURE/FEMUR SHAFT (Right)  Patient Location: PACU  Anesthesia Type:MAC and Spinal  Level of Consciousness: awake, alert  and oriented  Airway & Oxygen Therapy: Patient Spontanous Breathing  Post-op Assessment: Report given to PACU RN and Post -op Vital signs reviewed and stable  Post vital signs: Reviewed and stable  Complications: No apparent anesthesia complications

## 2014-10-29 NOTE — Progress Notes (Signed)
PACU brings pt to unit and RN receives bedside report.

## 2014-10-30 ENCOUNTER — Encounter (HOSPITAL_COMMUNITY): Payer: Self-pay | Admitting: Orthopaedic Surgery

## 2014-10-30 DIAGNOSIS — I5040 Unspecified combined systolic (congestive) and diastolic (congestive) heart failure: Secondary | ICD-10-CM

## 2014-10-30 LAB — BASIC METABOLIC PANEL
Anion gap: 11 (ref 5–15)
BUN: 11 mg/dL (ref 6–23)
CO2: 23 mmol/L (ref 19–32)
Calcium: 8.3 mg/dL — ABNORMAL LOW (ref 8.4–10.5)
Chloride: 102 mEq/L (ref 96–112)
Creatinine, Ser: 0.82 mg/dL (ref 0.50–1.10)
GFR calc Af Amer: 76 mL/min — ABNORMAL LOW (ref >90)
GFR calc non Af Amer: 65 mL/min — ABNORMAL LOW (ref >90)
GLUCOSE: 107 mg/dL — AB (ref 70–99)
Potassium: 3.7 mmol/L (ref 3.5–5.1)
Sodium: 136 mmol/L (ref 135–145)

## 2014-10-30 LAB — CBC
HCT: 32.5 % — ABNORMAL LOW (ref 36.0–46.0)
Hemoglobin: 10.1 g/dL — ABNORMAL LOW (ref 12.0–15.0)
MCH: 27.7 pg (ref 26.0–34.0)
MCHC: 31.1 g/dL (ref 30.0–36.0)
MCV: 89.3 fL (ref 78.0–100.0)
PLATELETS: 276 10*3/uL (ref 150–400)
RBC: 3.64 MIL/uL — ABNORMAL LOW (ref 3.87–5.11)
RDW: 14.2 % (ref 11.5–15.5)
WBC: 14.2 10*3/uL — AB (ref 4.0–10.5)

## 2014-10-30 LAB — PROTIME-INR
INR: 1.17 (ref 0.00–1.49)
PROTHROMBIN TIME: 15 s (ref 11.6–15.2)

## 2014-10-30 MED ORDER — HYDROMORPHONE HCL 1 MG/ML IJ SOLN
1.0000 mg | Freq: Four times a day (QID) | INTRAMUSCULAR | Status: DC | PRN
  Administered 2014-10-30 (×2): 1 mg via INTRAVENOUS
  Filled 2014-10-30: qty 1

## 2014-10-30 MED ORDER — FUROSEMIDE 20 MG PO TABS
20.0000 mg | ORAL_TABLET | Freq: Every day | ORAL | Status: DC
  Administered 2014-10-31: 20 mg via ORAL
  Filled 2014-10-30: qty 1

## 2014-10-30 MED ORDER — POTASSIUM CHLORIDE CRYS ER 20 MEQ PO TBCR
20.0000 meq | EXTENDED_RELEASE_TABLET | Freq: Every day | ORAL | Status: DC
  Administered 2014-10-30 – 2014-10-31 (×2): 20 meq via ORAL
  Filled 2014-10-30: qty 1

## 2014-10-30 MED ORDER — BISOPROLOL FUMARATE 5 MG PO TABS
5.0000 mg | ORAL_TABLET | Freq: Every day | ORAL | Status: DC
  Administered 2014-10-31: 5 mg via ORAL
  Filled 2014-10-30: qty 1

## 2014-10-30 MED ORDER — POTASSIUM CHLORIDE 20 MEQ PO PACK
20.0000 meq | PACK | Freq: Every day | ORAL | Status: DC
  Filled 2014-10-30: qty 1

## 2014-10-30 MED ORDER — ACETAMINOPHEN 500 MG PO TABS
1000.0000 mg | ORAL_TABLET | Freq: Three times a day (TID) | ORAL | Status: DC
  Administered 2014-10-30 – 2014-10-31 (×4): 1000 mg via ORAL
  Filled 2014-10-30 (×6): qty 2

## 2014-10-30 MED ORDER — WARFARIN SODIUM 5 MG PO TABS
5.0000 mg | ORAL_TABLET | Freq: Once | ORAL | Status: AC
  Administered 2014-10-30: 5 mg via ORAL
  Filled 2014-10-30 (×2): qty 1

## 2014-10-30 NOTE — Op Note (Signed)
Hannah Mckee, Hannah Mckee              ACCOUNT NO.:  [id number]  MEDICAL RECORD NO.:  [id number]  LOCATION:  5N12C                        FACILITY:  [facility]  PHYSICIAN:  Lind Guest. Ninfa Linden, M.D.DATE OF BIRTH:  04/27/1931  DATE OF PROCEDURE:  10/29/2014 DATE OF DISCHARGE:                              OPERATIVE REPORT   PREOPERATIVE DIAGNOSIS:  Right periprosthetic femur fracture at the distal tip of a primary total hip arthroplasty.  POSTOPERATIVE DIAGNOSIS:  Right periprosthetic femur fracture at the distal tip of a primary total hip arthroplasty.  PROCEDURE:  Open reduction and internal fixation of right periprosthetic femur fracture using cable plate and screws.  IMPLANTS:  Zimmer locking cable plate.  SURGEON:  Lind Guest. Ninfa Linden, M.D.  ASSISTANT:  Erskine Emery, P.A.C.  ANESTHESIA:  Spinal.  ANTIBIOTICS:  900 mg IV clindamycin.  BLOOD LOSS:  100-200 mL.  COMPLICATIONS:  None.  INDICATIONS:  Hannah Mckee is an 80 year old female with congestive heart failure and COPD and osteoporosis who had a mechanical fall yesterday and sustained a very prosthetic femur fracture at the distal tip of the hip replacement that was placed in the 80s at [facility].  She had debilitating pain and inability to ambulate.  Her family and she understands that surgery is being recommended for open reduction and internal fixation to stabilize the fracture and improve her alignment to hopefully decrease her pain and improve her mobility and quality of life.  They understand that the fixation that will likely have her nonweightbearing for up to 3 months to allow for adequate healing.  PROCEDURE DESCRIPTION:  After informed consent was obtained, appropriate right leg was marked.  She was brought to the operating room and spinal anesthesia was obtained while she was on her stretcher.  She was next placed supine on the flat [place] table and then turned into a lateral decubitus position with the  right operative site up, padding and down on operative left leg, appropriate positioning of her arms, head, and neck. Her right leg was then prepped and draped from the upper hip down to the mid leg with DuraPrep and sterile drapes including sterile stockinette. A time-out was called and she was identified as correct patient, correct right leg.  I then made a lateral incision over the fracture site and carried this proximally and distally.  I dissected down to the iliotibial band and divided the iliotibial band longitudinally.  I then meticulously split her vastus muscle to gain exposure of the fracture. We then irrigated out the fracture of hematoma and used reduction forceps and traction, I was able to anatomically reduce the fracture.  I then chose a long Zimmer cable locking plate and placed this along the lateral cortex of the femur and secured this with cables proximally and bicortical locking screws distally.  This was done under direct visualization and direct fluoroscopy as well to reduce the fracture. Once we are pleased with this, we copiously irrigated the soft tissues and bone with normal saline solution.  We closed the vastus interval with interrupted loose #1 Vicryl suture followed by #1 Vicryl in the IT band, 0 Vicryl in the deep tissue, 2-0 Vicryl in subcutaneous tissue, and staples on the skin.  A well-padded dressing was applied.  She was laid back in supine position and taken to the recovery room in stable condition.  All final counts were correct.  There were no complications noted.  Of note, Erskine Emery, PA-C's assistance was crucial in this case during all aspects of retracting, reduction, fracture fixation, and closure.     Lind Guest. Ninfa Linden, M.D.     CYB/MEDQ  D:  10/29/2014  T:  10/30/2014  Job:  [id number]

## 2014-10-30 NOTE — Clinical Social Work Psychosocial (Deleted)
Deleted

## 2014-10-30 NOTE — Progress Notes (Signed)
Subjective: 1 Day Post-Op Procedure(s) (LRB): OPEN REDUCTION INTERNAL FIXATION (ORIF) PERIPROSTHETIC FEMUR FRACTURE/FEMUR SHAFT (Right) Patient reports pain as moderate.  Acute blood loss anemia from her fracture and surgery.  Objective: Vital signs in last 24 hours: Temp:  [97.8 F (36.6 C)-98.1 F (36.7 C)] 98 F (36.7 C) (12/30 0500) Pulse Rate:  [64-77] 77 (12/30 0500) Resp:  [11-20] 18 (12/30 0500) BP: (120-138)/(31-95) 124/69 mmHg (12/30 0500) SpO2:  [84 %-100 %] 93 % (12/30 0635)  Intake/Output from previous day: 12/29 0701 - 12/30 0700 In: 2361.7 [P.O.:490; I.V.:1671.7; IV Piggyback:200] Out: 875 [Urine:775; Blood:100] Intake/Output this shift:     Recent Labs  10/28/14 1503 10/29/14 0555 10/30/14 0605  HGB 12.3 10.6* 10.1*    Recent Labs  10/29/14 0555 10/30/14 0605  WBC 9.5 14.2*  RBC 3.91 3.64*  HCT 34.2* 32.5*  PLT 250 276    Recent Labs  10/28/14 1503 10/29/14 0555  NA 139 135  K 3.7 3.6  CL 105 102  CO2 28 27  BUN 14 12  CREATININE 0.67 0.67  GLUCOSE 101* 116*  CALCIUM 9.3 8.7    Recent Labs  10/28/14 2035 10/30/14 0605  INR 1.10 1.17    Sensation intact distally Intact pulses distally Dorsiflexion/Plantar flexion intact Incision: dressing C/D/I  Assessment/Plan: 1 Day Post-Op Procedure(s) (LRB): OPEN REDUCTION INTERNAL FIXATION (ORIF) PERIPROSTHETIC FEMUR FRACTURE/FEMUR SHAFT (Right) Up with therapy; non-weight bearing right leg.  Jillien Yakel Y 10/30/2014, 7:39 AM

## 2014-10-30 NOTE — Evaluation (Signed)
Physical Therapy Evaluation Patient Details Name: Hannah Mckee MRN: [id number] DOB: May 13, 1931 Today's Date: 10/30/2014   History of Present Illness  Pt is a 80 y.o. female adm due to fall; s/p Rt ORIF of femur fx.   Clinical Impression  Patient is s/p above surgery due to fall at home resulting in functional limitations due to the deficits listed below (see PT Problem List). PTA pt was very independent and living alone. Patient will benefit from skilled PT to increase their independence and safety with mobility to allow discharge to the venue listed below.       Follow Up Recommendations SNF;Supervision/Assistance - 24 hour    Equipment Recommendations  Other (comment) (TBD at rehab)    Recommendations for Other Services OT consult     Precautions / Restrictions Precautions Precautions: Fall Restrictions Weight Bearing Restrictions: Yes RLE Weight Bearing: Non weight bearing      Mobility  Bed Mobility Overal bed mobility: +2 for physical assistance;Needs Assistance Bed Mobility: Supine to Sit     Supine to sit: Mod assist;HOB elevated;+2 for physical assistance     General bed mobility comments: pt greatly limitedy by pain; cues for sequencing; use of draw pad to bring hips over to EOB; requires max (A) to control Rt LE off EOB and (A) to elevate trunk by 2nd person  Transfers Overall transfer level: Needs assistance Equipment used: 2 person hand held assist Transfers: Sit to/from [facility] Sit to Stand: Total assist;+2 physical assistance;From elevated surface Stand pivot transfers: Total assist;+2 physical assistance;From elevated surface       General transfer comment: requires max (A) to maintain NWB status on RT LE; while 2nd person performs total/dependent SPT to chair; max cues for sequencing  Ambulation/Gait                Stairs            Wheelchair Mobility    Modified Rankin (Stroke Patients Only)        Balance Overall balance assessment: History of Falls;Needs assistance Sitting-balance support: Feet supported;Bilateral upper extremity supported Sitting balance-Leahy Scale: Zero Sitting balance - Comments: heavy lean to Lt due to pain in Rt hip; (A) required to maintain Rt LE extension due to pain with knee flexion  Postural control: Left lateral lean;Posterior lean Standing balance support: During functional activity;Bilateral upper extremity supported Standing balance-Leahy Scale: Zero Standing balance comment: 2person (A) for standing                              Pertinent Vitals/Pain Pain Assessment: 0-10 Pain Score: 9  Pain Location: Rt hip with movement  Pain Descriptors / Indicators: Grimacing;Guarding Pain Intervention(s): Monitored during session;Premedicated before session;Repositioned;Limited activity within patient's tolerance    Home Living Family/patient expects to be discharged to:: Skilled nursing facility                 Additional Comments: Pt lives alone     Prior Function Level of Independence: Independent with assistive device(s)         Comments: ambualted with RW or cane pending breathing dailiy     Hand Dominance        Extremity/Trunk Assessment   Upper Extremity Assessment: Defer to OT evaluation           Lower Extremity Assessment: RLE deficits/detail      Cervical / Trunk Assessment: Kyphotic;Other exceptions  Communication   Communication: [facility]  Cognition Arousal/Alertness: Awake/alert Behavior During Therapy: Anxious Overall Cognitive Status: Within Functional Limits for tasks assessed                      General Comments      Exercises Low Level/ICU Exercises Ankle Circles/Pumps: AROM;Both;10 reps;Supine      Assessment/Plan    PT Assessment Patient needs continued PT services  PT Diagnosis Difficulty walking;Generalized weakness;Acute pain   PT Problem List Decreased  strength;Decreased range of motion;Decreased activity tolerance;Decreased balance;Decreased mobility;Decreased knowledge of use of DME;Decreased safety awareness;Decreased knowledge of precautions;Pain  PT Treatment Interventions DME instruction;Functional mobility training;Therapeutic activities;Therapeutic exercise;Balance training;Neuromuscular re-education;Patient/family education;Wheelchair mobility training   PT Goals (Current goals can be found in the Care Plan section) Acute Rehab PT Goals Patient Stated Goal: to not have pain like this ever again PT Goal Formulation: With patient Time For Goal Achievement: 11/06/14 Potential to Achieve Goals: Fair    Frequency Min 3X/week   Barriers to discharge Decreased caregiver support lives alone; 2 person (A) for mobility    Co-evaluation               End of Session Equipment Utilized During Treatment: Gait belt Activity Tolerance: Patient limited by fatigue;Patient limited by pain Patient left: in chair;with call bell/phone within reach Nurse Communication: Mobility status;Precautions;Weight bearing status         Time: 8016-5537 PT Time Calculation (min) (ACUTE ONLY): 19 min   Charges:   PT Evaluation $Initial PT Evaluation Tier I: 1 Procedure PT Treatments $Therapeutic Activity: 8-22 mins   PT G CodesGustavus Bryant, [place]  [phone] 10/30/2014, 9:49 AM

## 2014-10-30 NOTE — Clinical Social Work Placement (Deleted)
deleted

## 2014-10-30 NOTE — Progress Notes (Signed)
Orthopedic Tech Progress Note Patient Details:  Hannah Mckee [birth year] [id number]  Patient ID: [name], female   DOB: 1930/12/30, 80 y.o.   MRN: [id number] Pt unable to use trapeze bar patient helper; RN notified  Hildred Priest 10/30/2014, 9:22 AM

## 2014-10-30 NOTE — Progress Notes (Signed)
PATIENT DETAILS Name: Hannah Mckee Age: 80 y.o. Sex: female Date of Birth: 1930-11-18 Admit Date: 10/28/2014 Admitting Physician Thurnell Lose, MD [id number] B., MD  Subjective: Some pain at the operative site-otherwise no major complaints.  Assessment/Plan: Principal Problem:   Right femoral fracture: Secondary to a mechanical fall. Orthopedics consulted, underwent total right hip arthroplasty on 10/29/14. Suspect will require SNF on discharge.  Active Problems:    Anemia: Anemia likely secondary to perioperative blood loss, IV fluids. Continue to monitor and transfuse if significant drop in hemoglobin.      Hypertension: Continue with bisoprolol.    COPD (chronic obstructive pulmonary disease): Lungs are clear, continue with nebs.    Reported history of Congestive heart disease: Not known whether systolic or diastolic, no prior echocardiogram. Remains compensated. Resume Lasix  Disposition: Remain inpatient  Antibiotics:  See below   Anti-infectives    Start     Dose/Rate Route Frequency Ordered Stop   10/30/14 0500  vancomycin (VANCOCIN) IVPB 1000 mg/200 mL premix     1,000 mg200 mL/hr over 60 Minutes Intravenous Every 12 hours 10/29/14 2100 10/30/14 0636   10/29/14 1700  clindamycin (CLEOCIN) IVPB 900 mg  Status:  Discontinued     900 mg100 mL/hr over 30 Minutes Intravenous To Surgery 10/29/14 1657 10/30/14 1122      DVT Prophylaxis: On Coumadin per orthopedics recommendations.  Code Status: Full code   Family Communication Tammy-daughter at bedside  Procedures:  Right hip hemiarthroplasty 12/29  CONSULTS:  orthopedic surgery  Time spent 40 minutes-which includes 50% of the time with face-to-face with patient/ family and coordinating care related to the above assessment and plan.  MEDICATIONS: Scheduled Meds: . acetaminophen  1,000 mg Oral TID  . bisoprolol  5 mg Oral Daily  . calcium carbonate  1 tablet Oral Q breakfast  .  Linaclotide  145 mcg Oral Daily  . polyethylene glycol  17 g Oral BID  . sodium chloride  3 mL Intravenous Q12H  . warfarin  5 mg Oral ONCE-1800  . Warfarin - Pharmacist Dosing Inpatient   Does not apply q1800   Continuous Infusions:  PRN Meds:.alum & mag hydroxide-simeth, diazepam, guaiFENesin-dextromethorphan, hydrALAZINE, HYDROcodone-acetaminophen, HYDROmorphone (DILAUDID) injection, ipratropium-albuterol, menthol-cetylpyridinium **OR** phenol, methocarbamol **OR** methocarbamol (ROBAXIN)  IV, metoCLOPramide **OR** metoCLOPramide (REGLAN) injection, morphine injection, ondansetron **OR** ondansetron (ZOFRAN) IV    PHYSICAL EXAM: Vital signs in last 24 hours: Filed Vitals:   10/30/14 0635 10/30/14 0800 10/30/14 1200 10/30/14 1354  BP:    137/41  Pulse:      Temp:    98.5 F (36.9 C)  TempSrc:      Resp:  18 18 18   Height:      Weight:      SpO2: 93% 93% 93% 95%    Weight change:  Filed Weights   10/28/14 1343 10/29/14 0600  Weight: 53.978 kg (119 lb) 70.2 kg (154 lb 12.2 oz)   Body mass index is 35.31 kg/(m^2).   Gen Exam: Awake and alert with clear speech.   Neck: Supple, No JVD.   Chest: B/L Clear.   CVS: S1 S2 Regular, no murmurs.  Abdomen: soft, BS +, non tender, non distended.  Extremities: no edema, lower extremities warm to touch. Neurologic: Non Focal.   Skin: No Rash.   Wounds: N/A.    Intake/Output from previous day:  Intake/Output Summary (Last 24 hours) at 10/30/14 1514 Last data filed at 10/30/14 0641  Gross per  24 hour  Intake 2361.67 ml  Output    625 ml  Net 1736.67 ml     LAB RESULTS: CBC  Recent Labs Lab 10/28/14 1503 10/29/14 0555 10/30/14 0605  WBC 13.8* 9.5 14.2*  HGB 12.3 10.6* 10.1*  HCT 38.9 34.2* 32.5*  PLT 268 250 276  MCV 86.1 87.5 89.3  MCH 27.2 27.1 27.7  MCHC 31.6 31.0 31.1  RDW 13.9 14.3 14.2  LYMPHSABS 0.8  --   --   MONOABS 0.7  --   --   EOSABS 0.1  --   --   BASOSABS 0.0  --   --     Chemistries    Recent Labs Lab 10/28/14 1503 10/29/14 0555 10/30/14 0605  NA 139 135 136  K 3.7 3.6 3.7  CL 105 102 102  CO2 28 27 23   GLUCOSE 101* 116* 107*  BUN 14 12 11   CREATININE 0.67 0.67 0.82  CALCIUM 9.3 8.7 8.3*    CBG: No results for input(s): GLUCAP in the last 168 hours.  GFR Estimated Creatinine Clearance: 41.8 mL/min (by C-G formula based on Cr of 0.82).  Coagulation profile  Recent Labs Lab 10/28/14 2035 10/30/14 0605  INR 1.10 1.17    Cardiac Enzymes No results for input(s): CKMB, TROPONINI, MYOGLOBIN in the last 168 hours.  Invalid input(s): CK  Invalid input(s): POCBNP No results for input(s): DDIMER in the last 72 hours. No results for input(s): HGBA1C in the last 72 hours. No results for input(s): CHOL, HDL, LDLCALC, TRIG, CHOLHDL, LDLDIRECT in the last 72 hours. No results for input(s): TSH, T4TOTAL, T3FREE, THYROIDAB in the last 72 hours.  Invalid input(s): FREET3 No results for input(s): VITAMINB12, FOLATE, FERRITIN, TIBC, IRON, RETICCTPCT in the last 72 hours. No results for input(s): LIPASE, AMYLASE in the last 72 hours.  Urine Studies No results for input(s): UHGB, CRYS in the last 72 hours.  Invalid input(s): UACOL, UAPR, USPG, UPH, UTP, UGL, UKET, UBIL, UNIT, UROB, ULEU, UEPI, UWBC, URBC, UBAC, CAST, UCOM, BILUA  MICROBIOLOGY: Recent Results (from the past 240 hour(s))  Surgical PCR screen     Status: None   Collection Time: 10/29/14  1:34 PM  Result Value Ref Range Status   MRSA, PCR NEGATIVE NEGATIVE Final   Staphylococcus aureus NEGATIVE NEGATIVE Final    Comment:        The Xpert SA Assay (FDA approved for NASAL specimens in patients over 7 years of age), is one component of a comprehensive surveillance program.  Test performance has been validated by [employer] for patients greater than or equal to 2 year old. It is not intended to diagnose infection nor to guide or monitor treatment.     RADIOLOGY STUDIES/RESULTS: Dg  Femur Right  10/29/2014   CLINICAL DATA:  ORIF right femur fracture.  EXAM: RIGHT FEMUR - 2 VIEW  COMPARISON:  10/28/2014 radiographs  FINDINGS: Six intraoperative views of the proximal right femur are submitted postoperatively for interpretation.  Internal plate and screw fixation traverses a proximal femoral diaphyseal fracture, in near-anatomic alignment and position.  The femoral component of a right total hip arthroplasty is again noted.  IMPRESSION: Internal fixation of proximal femoral fracture, in near-anatomic alignment and position.   Electronically Signed   By: Hassan Rowan M.D.   On: 10/29/2014 19:17   Dg Femur Right  10/28/2014   CLINICAL DATA:  Slip and fall on rug in bathroom this morning. Right femur pain. Initial encounter.  EXAM: RIGHT FEMUR -  2 VIEW  COMPARISON:  Abdominal radiographs 10/21/2014  FINDINGS: Sequelae of prior right total hip arthroplasty are again identified. There is an acute transverse fracture of the right femoral shaft at the tip of the stem of the femoral prosthesis with slight varus angulation. There is approximately 1/2 shaft width lateral displacement and approximately 1 shaft width posterior displacement with significant overriding. Hip and knee are grossly located. No lytic or blastic osseous lesion is seen. External traction device is in place.  IMPRESSION: Acute right femur fracture at tip of femoral prosthesis with displacement and overriding as above.   Electronically Signed   By: Logan Bores   On: 10/28/2014 15:46   Dg Chest Port 1 View  10/28/2014   CLINICAL DATA:  Fall with possible hip fracture. No chest complaints.  EXAM: PORTABLE CHEST - 1 VIEW  COMPARISON:  07/24/2014.  FINDINGS: Patient is rotated to the RIGHT and this exam has reverse lordotic projection, probably secondary to kyphosis. Aortic atherosclerosis. Cardiopericardial silhouette within normal limits. Monitoring leads project over the chest.  IMPRESSION: No active disease.   Electronically  Signed   By: Dereck Ligas M.D.   On: 10/28/2014 16:34   Dg C-arm 61-120 Min  10/29/2014   CLINICAL DATA:  Femur fracture.  EXAM: DG C-ARM 61-120 MIN  TECHNIQUE: Six fluoroscopic spot images from the operating room for noted.  FLUOROSCOPY TIME:  19 seconds  COMPARISON:  10/28/2014  FINDINGS: Placement of lateral plate and multi screw fixation and cerclage wires traversing periprosthetic femur fracture. The alignment is improved compared to preoperative radiographs. Hip arthroplasty partially included in the field of view.  IMPRESSION: Plate and multi screw fixation fixating right femur fracture just distal to femoral stem of the hip arthroplasty.   Electronically Signed   By: Jeb Levering M.D.   On: 10/29/2014 19:17    Oren Binet, MD  [facility] Pager:[phone]  If 7PM-7AM, please contact night-coverage [email or web address] Password [email or web address] 10/30/2014, 3:14 PM   LOS: 2 days

## 2014-10-30 NOTE — Progress Notes (Signed)
ANTICOAGULATION CONSULT NOTE - Follow Up Consult  Pharmacy Consult for Coumadin Indication: VTE prophylaxis  Allergies  Allergen Reactions  . Erythromycin Swelling  . Levaquin [Levofloxacin] Other (See Comments)    Tongue turns red and sore  . Penicillins Swelling  . Sulfa Antibiotics Other (See Comments)    Tongue turns red and sore    Patient Measurements: Height: 4' 7.5" (141 cm) Weight: 154 lb 12.2 oz (70.2 kg) (10lb bucks traction & bar) IBW/kg (Calculated) : 35.15 Heparin Dosing Weight:    Vital Signs: Temp: 98 F (36.7 C) (12/30 0500) Temp Source: Oral (12/30 0500) BP: 124/69 mmHg (12/30 0500) Pulse Rate: 77 (12/30 0500)  Labs:  Recent Labs  10/28/14 1503 10/28/14 2035 10/29/14 0555 10/30/14 0605  HGB 12.3  --  10.6* 10.1*  HCT 38.9  --  34.2* 32.5*  PLT 268  --  250 276  LABPROT  --  14.4  --  15.0  INR  --  1.10  --  1.17  CREATININE 0.67  --  0.67 0.82    Estimated Creatinine Clearance: 41.8 mL/min (by C-G formula based on Cr of 0.82).  Assessment: 38 YOF with femur fracture, s/p ORIF. Start coumadin for VTE prophylaxis. Baseline INR 1.1, Baseline Hgb 12.3, Plts 268. No anticoagulation PTA.  Anticoagulation: Coumadin for VTE prophx. INR 1.17. Hgb down to 10.1 with post-op anemia. Plts stable.  Goal of Therapy:  INR 2-3 Monitor platelets by anticoagulation protocol: Yes   Plan:  Coumadin 5mg  po 1 tonight. Daily INR  Hannah Mckee, PharmD, BCPS Clinical Staff Pharmacist Pager [phone]  Eilene Ghazi Stillinger 10/30/2014,11:19 AM

## 2014-10-30 NOTE — Discharge Instructions (Addendum)

## 2014-10-30 NOTE — Evaluation (Signed)
Occupational Therapy Evaluation Patient Details Name: Hannah Mckee MRN: [id number] DOB: [birth year] Today's Date: 10/30/2014    History of Present Illness Pt is a 80 y.o. female adm due to fall; s/p Rt ORIF of femur fx.    Clinical Impression   Pt with decline in function and safety with ADLs and ADL mobility with decreased strength, balance and endurance. Pt is NWB R LE and requires extensive A for ADLs. Pt would benefit from acute OT services to address impairments to increase level of function and safety    Follow Up Recommendations  SNF;Supervision/Assistance - 24 hour    Equipment Recommendations  Other (comment) (TBD)    Recommendations for Other Services       Precautions / Restrictions Precautions Precautions: Fall Restrictions Weight Bearing Restrictions: Yes RLE Weight Bearing: Non weight bearing      Mobility Bed Mobility Overal bed mobility: +2 for physical assistance;Needs Assistance Bed Mobility: Supine to Sit     Supine to sit: Mod assist;HOB elevated;+2 for physical assistance     General bed mobility comments: pt up in recliner  Transfers Overall transfer level: Needs assistance Equipment used: 2 person hand held assist Transfers: Sit to/from Stand Sit to Stand: +2 physical assistance Stand pivot transfers: Total assist;+2 physical assistance;From elevated surface       General transfer comment: requires max (A) to maintain NWB status on R LE; able to clear hips from chair, but unable to stand upright    Balance Overall balance assessment: History of Falls;Needs assistance Sitting-balance support: Bilateral upper extremity supported;Feet supported Sitting balance-Leahy Scale: Poor Sitting balance - Comments: heavy lean to Lt due to pain in Rt hip; (A) required to maintain Rt LE extension due to pain with knee flexion  Postural control: Left lateral lean;Posterior lean Standing balance support: Bilateral upper extremity supported Standing  balance-Leahy Scale: Zero Standing balance comment: 2person (A) for standing                             ADL Overall ADL's : Needs assistance/impaired     Grooming: Wash/dry hands;Wash/dry face;Sitting;Min guard   Upper Body Bathing: Sitting;Moderate assistance Upper Body Bathing Details (indicate cue type and reason): Poor sitting balance Lower Body Bathing: Total assistance   Upper Body Dressing : Sitting;Moderate assistance Upper Body Dressing Details (indicate cue type and reason): Poor sitting balance Lower Body Dressing: Total assistance   Toilet Transfer: +2 for physical assistance;+2 for safety/equipment;Total assistance   Toileting- Clothing Manipulation and Hygiene: Total assistance       Functional mobility during ADLs: Total assistance;+2 for physical assistance;+2 for safety/equipment       Vision  wears reading glasses                              Pertinent Vitals/Pain Pain Assessment: 0-10 Pain Score: 6  Pain Location: R hip with mobility Pain Descriptors / Indicators: Grimacing;Guarding;Moaning Pain Intervention(s): Monitored during session;Premedicated before session;Repositioned;Limited activity within patient's tolerance     Hand Dominance Right   Extremity/Trunk Assessment Upper Extremity Assessment Upper Extremity Assessment: Generalized weakness   Lower Extremity Assessment Lower Extremity Assessment: RLE deficits/detail RLE: Unable to fully assess due to pain RLE Coordination: decreased gross motor   Cervical / Trunk Assessment Cervical / Trunk Assessment: Kyphotic Cervical / Trunk Exceptions: rounded head fwd shoulders   Communication Communication Communication: HOH   Cognition Arousal/Alertness: Awake/alert Behavior During  Therapy: Anxious Overall Cognitive Status: Within Functional Limits for tasks assessed                     General Comments  Pt pleasant and cooperative, humorous            Home Living Family/patient expects to be discharged to:: Skilled nursing facility                                 Additional Comments: Pt lives alone       Prior Functioning/Environment Level of Independence: Independent with assistive device(s)        Comments: ambualted with RW or cane pending breathing dailiy    OT Diagnosis: Generalized weakness;Acute pain   OT Problem List: Decreased strength;Decreased knowledge of use of DME or AE;Decreased activity tolerance;Impaired balance (sitting and/or standing);Decreased safety awareness;Pain   OT Treatment/Interventions: Self-care/ADL training;Therapeutic exercise;Patient/family education;DME and/or AE instruction;Therapeutic activities;Balance training;Neuromuscular education    OT Goals(Current goals can be found in the care plan section) Acute Rehab OT Goals Patient Stated Goal: to not have pain like this ever again OT Goal Formulation: With patient/family Time For Goal Achievement: 11/06/14 Potential to Achieve Goals: Good ADL Goals Pt Will Perform Grooming: with set-up;sitting (unsupported) Pt Will Perform Upper Body Bathing: with min assist;sitting Pt Will Perform Lower Body Bathing: with mod assist;with max assist;sitting/lateral leans Pt Will Perform Upper Body Dressing: with min assist;sitting Pt Will Transfer to Toilet: stand pivot transfer;bedside commode;with total assist  OT Frequency: Min 2X/week   Barriers to D/C: Decreased caregiver support                        End of Session Equipment Utilized During Treatment: Gait belt;Rolling walker  Activity Tolerance: Patient limited by pain Patient left: in chair;with call bell/phone within reach;with family/visitor present   Time: 9476-5465 OT Time Calculation (min): 25 min Charges:  OT General Charges $OT Visit: 1 Procedure OT Evaluation $Initial OT Evaluation Tier I: 1 Procedure OT Treatments $Therapeutic Activity: 8-22  mins G-Codes:    Britt Bottom 10/30/2014, 1:12 PM

## 2014-10-31 DIAGNOSIS — S7290XA Unspecified fracture of unspecified femur, initial encounter for closed fracture: Secondary | ICD-10-CM

## 2014-10-31 LAB — BASIC METABOLIC PANEL
ANION GAP: 3 — AB (ref 5–15)
BUN: 10 mg/dL (ref 6–23)
CALCIUM: 8.8 mg/dL (ref 8.4–10.5)
CHLORIDE: 106 meq/L (ref 96–112)
CO2: 28 mmol/L (ref 19–32)
CREATININE: 0.64 mg/dL (ref 0.50–1.10)
GFR, EST NON AFRICAN AMERICAN: 81 mL/min — AB (ref >90)
Glucose, Bld: 134 mg/dL — ABNORMAL HIGH (ref 70–99)
Potassium: 3.8 mmol/L (ref 3.5–5.1)
SODIUM: 137 mmol/L (ref 135–145)

## 2014-10-31 LAB — CBC
HEMATOCRIT: 28.6 % — AB (ref 36.0–46.0)
Hemoglobin: 9 g/dL — ABNORMAL LOW (ref 12.0–15.0)
MCH: 27 pg (ref 26.0–34.0)
MCHC: 31.5 g/dL (ref 30.0–36.0)
MCV: 85.9 fL (ref 78.0–100.0)
Platelets: 216 10*3/uL (ref 150–400)
RBC: 3.33 MIL/uL — ABNORMAL LOW (ref 3.87–5.11)
RDW: 14.1 % (ref 11.5–15.5)
WBC: 11.8 10*3/uL — ABNORMAL HIGH (ref 4.0–10.5)

## 2014-10-31 LAB — PROTIME-INR
INR: 1.34 (ref 0.00–1.49)
PROTHROMBIN TIME: 16.7 s — AB (ref 11.6–15.2)

## 2014-10-31 MED ORDER — METHOCARBAMOL 500 MG PO TABS: 500.0000 mg | ORAL_TABLET | Freq: Four times a day (QID) | ORAL | Status: DC | PRN

## 2014-10-31 MED ORDER — FUROSEMIDE 40 MG PO TABS: 20.0000 mg | ORAL_TABLET | Freq: Every day | ORAL | Status: DC

## 2014-10-31 MED ORDER — OXYCODONE HCL 5 MG PO TABS
5.0000 mg | ORAL_TABLET | ORAL | Status: DC | PRN
  Administered 2014-10-31 (×2): 10 mg via ORAL
  Filled 2014-10-31 (×2): qty 2

## 2014-10-31 MED ORDER — POLYETHYLENE GLYCOL 3350 17 G PO PACK
17.0000 g | PACK | Freq: Two times a day (BID) | ORAL | Status: DC
Start: 2014-10-31 — End: 2016-12-01

## 2014-10-31 MED ORDER — DIAZEPAM 5 MG PO TABS: 5.0000 mg | ORAL_TABLET | Freq: Four times a day (QID) | ORAL | Status: DC | PRN

## 2014-10-31 MED ORDER — OXYCODONE-ACETAMINOPHEN 10-325 MG PO TABS: 1.0000 | ORAL_TABLET | Freq: Four times a day (QID) | ORAL | Status: DC | PRN

## 2014-10-31 MED ORDER — OXYCODONE HCL 5 MG PO TABS: 5.0000 mg | ORAL_TABLET | ORAL | Status: DC | PRN

## 2014-10-31 MED ORDER — BISOPROLOL FUMARATE 5 MG PO TABS: 5.0000 mg | ORAL_TABLET | Freq: Every day | ORAL | Status: DC

## 2014-10-31 MED ORDER — WARFARIN SODIUM 5 MG PO TABS
5.0000 mg | ORAL_TABLET | Freq: Once | ORAL | Status: DC
  Filled 2014-10-31: qty 1

## 2014-10-31 MED ORDER — WARFARIN SODIUM 5 MG PO TABS: 5.0000 mg | ORAL_TABLET | Freq: Every day | ORAL | Status: DC

## 2014-10-31 MED ORDER — IPRATROPIUM-ALBUTEROL 0.5-2.5 (3) MG/3ML IN SOLN: 3.0000 mL | RESPIRATORY_TRACT | Status: DC | PRN

## 2014-10-31 NOTE — Progress Notes (Signed)
Patient ID: Hannah Mckee, female   DOB: Sep 09, 1931, 80 y.o.   MRN: [id number] Looks good from an ortho standpoint.  Can go to SNF today.  Dressing intact and dry on right leg.

## 2014-10-31 NOTE — Progress Notes (Signed)
Report called to [facility] [phone].

## 2014-10-31 NOTE — Clinical Social Work Psychosocial (Signed)
Clinical Social Work Department BRIEF PSYCHOSOCIAL ASSESSMENT 10/31/2014  Patient:  Hannah Mckee, Hannah Mckee     Account Number:  [id number]     Admit date:  10/28/2014  Clinical Social Worker:  Wylene Men  Date/Time:  10/30/2014 12:35 PM  Referred by:  Physician  Date Referred:  10/10/2014 Referred for  SNF Placement  Psychosocial assessment   Other Referral:   none   Interview type:  Patient Other interview type:   patient and patient's daughter    PSYCHOSOCIAL DATA Living Status:  ALONE Admitted from facility:   Level of care:   Primary support name:  Hannah Mckee Primary support relationship to patient:  CHILD, ADULT Degree of support available:   strong    CURRENT CONCERNS Current Concerns  Post-Acute Placement   Other Concerns:   none    SOCIAL WORK ASSESSMENT / PLAN CSW assessed patient at bedside.  Patient was awake, alert and sitting in the bedside chair oriented x4 during the course of this assessment.  Patient daughter, Hannah Mckee was at bedside.  PT is recommending SNF at time of dc.  Patient and daughter is agreeable to SNF placement and requests [facility]. First choice is [facility] SNF of which has offered a bed.  Patient requests to be transferred by [facility].  [facility] Medicare authorization has been received.   Assessment/plan status:  Psychosocial Support/Ongoing Assessment of Needs Other assessment/ plan:   FL2  PASARR   Information/referral to community resources:   SNF/STR    PATIENT'S/FAMILY'S RESPONSE TO PLAN OF CARE: patient and daughter agreeable to SNF placement.       Nonnie Done, [place] [phone]  Psychiatric & Orthopedics (5N 1-16) Clinical Social Worker

## 2014-10-31 NOTE — Progress Notes (Signed)
PATIENT DETAILS Name: Hannah Mckee Age: 80 y.o. Sex: female Date of Birth: 06-Sep-1931 Admit Date: 10/28/2014 Admitting Physician Thurnell Lose, MD DTO:IZTI,WPYKD B., MD  Subjective: Continues to have pain, has anxiety about non weightbearing status to her right leg  Assessment/Plan: Principal Problem:   Right femoral fracture: Secondary to a mechanical fall. Orthopedics consulted, underwent total right hip arthroplasty on 10/29/14. Suspect will require SNF on discharge.Per ortho non weight bearing to right leg for atleast 3 months. Minimize IV Dilaudid, use scheduled tylenol and prn Oxycodone for pain. On coumadin for VTE prophylaxis, INR slowly increasing  Active Problems:    Anemia: Anemia likely secondary to perioperative blood loss, IV fluids. Continue to monitor and transfuse if significant drop in hemoglobin.      Hypertension: Continue with bisoprolol.    COPD (chronic obstructive pulmonary disease): Lungs are clear, continue with nebs.    Reported history of Congestive heart disease: Not known whether systolic or diastolic, no prior echocardiogram. Remains compensated. On Lasix  Disposition: Remain inpatient  Antibiotics:  See below   Anti-infectives    Start     Dose/Rate Route Frequency Ordered Stop   10/30/14 0500  vancomycin (VANCOCIN) IVPB 1000 mg/200 mL premix     1,000 mg200 mL/hr over 60 Minutes Intravenous Every 12 hours 10/29/14 2100 10/30/14 0636   10/29/14 1700  clindamycin (CLEOCIN) IVPB 900 mg  Status:  Discontinued     900 mg100 mL/hr over 30 Minutes Intravenous To Surgery 10/29/14 1657 10/30/14 1122      DVT Prophylaxis: On Coumadin per orthopedics recommendations.  Code Status: Full code   Family Communication Tammy-daughter at bedside  Procedures:  Right hip hemiarthroplasty 12/29  CONSULTS:  orthopedic surgery  MEDICATIONS: Scheduled Meds: . acetaminophen  1,000 mg Oral TID  . bisoprolol  5 mg Oral Daily  .  calcium carbonate  1 tablet Oral Q breakfast  . furosemide  20 mg Oral Daily  . Linaclotide  145 mcg Oral Daily  . polyethylene glycol  17 g Oral BID  . potassium chloride  20 mEq Oral Daily  . sodium chloride  3 mL Intravenous Q12H  . Warfarin - Pharmacist Dosing Inpatient   Does not apply q1800   Continuous Infusions:  PRN Meds:.alum & mag hydroxide-simeth, diazepam, guaiFENesin-dextromethorphan, hydrALAZINE, HYDROmorphone (DILAUDID) injection, ipratropium-albuterol, menthol-cetylpyridinium **OR** phenol, methocarbamol **OR** methocarbamol (ROBAXIN)  IV, metoCLOPramide **OR** metoCLOPramide (REGLAN) injection, morphine injection, ondansetron **OR** ondansetron (ZOFRAN) IV, oxyCODONE    PHYSICAL EXAM: Vital signs in last 24 hours: Filed Vitals:   10/30/14 1600 10/30/14 2241 10/31/14 0545 10/31/14 0553  BP:  128/39 111/35 107/38  Pulse:  73 75   Temp:  98.2 F (36.8 C) 98.1 F (36.7 C)   TempSrc:  Oral Oral   Resp: 16 17 16 17   Height:      Weight:      SpO2: 95% 100% 96%     Weight change:  Filed Weights   10/28/14 1343 10/29/14 0600  Weight: 53.978 kg (119 lb) 70.2 kg (154 lb 12.2 oz)   Body mass index is 35.31 kg/(m^2).   Gen Exam: Awake and alert with clear speech.   Neck: Supple, No JVD.   Chest: B/L Clear.   CVS: S1 S2 Regular, no murmurs.  Abdomen: soft, BS +, non tender, non distended.  Extremities: no edema, lower extremities warm to touch. Neurologic: Non Focal.   Skin: No Rash.   Wounds: N/A.  Intake/Output from previous day:  Intake/Output Summary (Last 24 hours) at 10/31/14 0827 Last data filed at 10/31/14 7672  Gross per 24 hour  Intake    240 ml  Output    850 ml  Net   -610 ml     LAB RESULTS: CBC  Recent Labs Lab 10/28/14 1503 05-Nov-2014 0555 10/30/14 0605 10/31/14 0526  WBC 13.8* 9.5 14.2* 11.8*  HGB 12.3 10.6* 10.1* 9.0*  HCT 38.9 34.2* 32.5* 28.6*  PLT 268 250 276 216  MCV 86.1 87.5 89.3 85.9  MCH 27.2 27.1 27.7 27.0  MCHC  31.6 31.0 31.1 31.5  RDW 13.9 14.3 14.2 14.1  LYMPHSABS 0.8  --   --   --   MONOABS 0.7  --   --   --   EOSABS 0.1  --   --   --   BASOSABS 0.0  --   --   --     Chemistries   Recent Labs Lab 10/28/14 1503 11-05-14 0555 10/30/14 0605 10/31/14 0526  NA 139 135 136 137  K 3.7 3.6 3.7 3.8  CL 105 102 102 106  CO2 28 27 23 28   GLUCOSE 101* 116* 107* 134*  BUN 14 12 11 10   CREATININE 0.67 0.67 0.82 0.64  CALCIUM 9.3 8.7 8.3* 8.8    CBG: No results for input(s): GLUCAP in the last 168 hours.  GFR Estimated Creatinine Clearance: 42.8 mL/min (by C-G formula based on Cr of 0.64).  Coagulation profile  Recent Labs Lab 10/28/14 2035 10/30/14 0605 10/31/14 0526  INR 1.10 1.17 1.34    Cardiac Enzymes No results for input(s): CKMB, TROPONINI, MYOGLOBIN in the last 168 hours.  Invalid input(s): CK  Invalid input(s): POCBNP No results for input(s): DDIMER in the last 72 hours. No results for input(s): HGBA1C in the last 72 hours. No results for input(s): CHOL, HDL, LDLCALC, TRIG, CHOLHDL, LDLDIRECT in the last 72 hours. No results for input(s): TSH, T4TOTAL, T3FREE, THYROIDAB in the last 72 hours.  Invalid input(s): FREET3 No results for input(s): VITAMINB12, FOLATE, FERRITIN, TIBC, IRON, RETICCTPCT in the last 72 hours. No results for input(s): LIPASE, AMYLASE in the last 72 hours.  Urine Studies No results for input(s): UHGB, CRYS in the last 72 hours.  Invalid input(s): UACOL, UAPR, USPG, UPH, UTP, UGL, UKET, UBIL, UNIT, UROB, ULEU, UEPI, UWBC, URBC, UBAC, CAST, UCOM, BILUA  MICROBIOLOGY: Recent Results (from the past 240 hour(s))  Surgical PCR screen     Status: None   Collection Time: 2014-11-05  1:34 PM  Result Value Ref Range Status   MRSA, PCR NEGATIVE NEGATIVE Final   Staphylococcus aureus NEGATIVE NEGATIVE Final    Comment:        The Xpert SA Assay (FDA approved for NASAL specimens in patients over 70 years of age), is one component of a  comprehensive surveillance program.  Test performance has been validated by [employer] for patients greater than or equal to 55 year old. It is not intended to diagnose infection nor to guide or monitor treatment.     RADIOLOGY STUDIES/RESULTS: Dg Femur Right  11-05-14   CLINICAL DATA:  ORIF right femur fracture.  EXAM: RIGHT FEMUR - 2 VIEW  COMPARISON:  10/28/2014 radiographs  FINDINGS: Six intraoperative views of the proximal right femur are submitted postoperatively for interpretation.  Internal plate and screw fixation traverses a proximal femoral diaphyseal fracture, in near-anatomic alignment and position.  The femoral component of a right total hip arthroplasty is  again noted.  IMPRESSION: Internal fixation of proximal femoral fracture, in near-anatomic alignment and position.   Electronically Signed   By: Hassan Rowan M.D.   On: 10/29/2014 19:17   Dg Femur Right  10/28/2014   CLINICAL DATA:  Slip and fall on rug in bathroom this morning. Right femur pain. Initial encounter.  EXAM: RIGHT FEMUR - 2 VIEW  COMPARISON:  Abdominal radiographs 10/21/2014  FINDINGS: Sequelae of prior right total hip arthroplasty are again identified. There is an acute transverse fracture of the right femoral shaft at the tip of the stem of the femoral prosthesis with slight varus angulation. There is approximately 1/2 shaft width lateral displacement and approximately 1 shaft width posterior displacement with significant overriding. Hip and knee are grossly located. No lytic or blastic osseous lesion is seen. External traction device is in place.  IMPRESSION: Acute right femur fracture at tip of femoral prosthesis with displacement and overriding as above.   Electronically Signed   By: Logan Bores   On: 10/28/2014 15:46   Dg Chest Port 1 View  10/28/2014   CLINICAL DATA:  Fall with possible hip fracture. No chest complaints.  EXAM: PORTABLE CHEST - 1 VIEW  COMPARISON:  07/24/2014.  FINDINGS: Patient is rotated  to the RIGHT and this exam has reverse lordotic projection, probably secondary to kyphosis. Aortic atherosclerosis. Cardiopericardial silhouette within normal limits. Monitoring leads project over the chest.  IMPRESSION: No active disease.   Electronically Signed   By: Dereck Ligas M.D.   On: 10/28/2014 16:34   Dg C-arm 61-120 Min  10/29/2014   CLINICAL DATA:  Femur fracture.  EXAM: DG C-ARM 61-120 MIN  TECHNIQUE: Six fluoroscopic spot images from the operating room for noted.  FLUOROSCOPY TIME:  19 seconds  COMPARISON:  10/28/2014  FINDINGS: Placement of lateral plate and multi screw fixation and cerclage wires traversing periprosthetic femur fracture. The alignment is improved compared to preoperative radiographs. Hip arthroplasty partially included in the field of view.  IMPRESSION: Plate and multi screw fixation fixating right femur fracture just distal to femoral stem of the hip arthroplasty.   Electronically Signed   By: Jeb Levering M.D.   On: 10/29/2014 19:17    Oren Binet, MD  [facility] Pager:[phone]  If 7PM-7AM, please contact night-coverage [email or web address] Password [email or web address] 10/31/2014, 8:27 AM   LOS: 3 days

## 2014-10-31 NOTE — Discharge Summary (Signed)
PATIENT DETAILS Name: Hannah Mckee Age: 80 y.o. Sex: female Date of Birth: [birth year] MRN: [id number]. Admitting Physician: Thurnell Lose, MD LYY:TKPT,WSFKC B., MD  Admit Date: 10/28/2014 Discharge date: 10/31/2014  Recommendations for Outpatient Follow-up:  1. Please check INR in 2 days, and adjust dosing of Coumadin 2. Please check CBC, chemistry in 1 week   PRIMARY DISCHARGE DIAGNOSIS:  Principal Problem:   Right femoral fracture Active Problems:   Hypertension   Diverticulitis of colon without hemorrhage   COPD (chronic obstructive pulmonary disease)   Femur fracture   Congestive heart disease      PAST MEDICAL HISTORY: Past Medical History  Diagnosis Date  . Hypertension   . Sepsis   . Arthritis   . Osteoarthritis   . CHF (congestive heart failure)   . COPD (chronic obstructive pulmonary disease)     DISCHARGE MEDICATIONS: Current Discharge Medication List    START taking these medications   Details  bisoprolol (ZEBETA) 5 MG tablet Take 1 tablet (5 mg total) by mouth daily.    ipratropium-albuterol (DUONEB) 0.5-2.5 (3) MG/3ML SOLN Take 3 mLs by nebulization every 4 (four) hours as needed. Qty: 360 mL    methocarbamol (ROBAXIN) 500 MG tablet Take 1 tablet (500 mg total) by mouth every 6 (six) hours as needed for muscle spasms. Qty: 30 tablet, Refills: 0    oxyCODONE (OXY IR/ROXICODONE) 5 MG immediate release tablet Take 1-2 tablets (5-10 mg total) by mouth every 4 (four) hours as needed for moderate pain. Qty: 30 tablet, Refills: 0    warfarin (COUMADIN) 5 MG tablet Take 1 tablet (5 mg total) by mouth daily at 6 PM. Qty: 30 tablet, Refills: 0      CONTINUE these medications which have CHANGED   Details  diazepam (VALIUM) 5 MG tablet Take 1 tablet (5 mg total) by mouth every 6 (six) hours as needed for anxiety. Anxiety. Qty: 30 tablet, Refills: 0    furosemide (LASIX) 40 MG tablet Take 0.5 tablets (20 mg total) by mouth daily. Qty: 30 tablet      polyethylene glycol (MIRALAX / GLYCOLAX) packet Take 17 g by mouth 2 (two) times daily. Qty: 14 each, Refills: 0      CONTINUE these medications which have NOT CHANGED   Details  alendronate (FOSAMAX) 70 MG tablet Take 70 mg by mouth every 7 (seven) days. Take with a full glass of water on an empty stomach.    calcium carbonate (OS-CAL) 600 MG TABS Take 600 mg by mouth daily.    Linaclotide (LINZESS) 145 MCG CAPS Take 1 capsule (145 mcg total) by mouth as needed. Qty: 30 capsule, Refills: 5    potassium chloride (KLOR-CON) 20 MEQ packet Take 20 mEq by mouth daily.     promethazine (PHENERGAN) 25 MG tablet Take 25 mg by mouth every 6 (six) hours as needed for nausea.       STOP taking these medications     aspirin 81 MG tablet      benazepril (LOTENSIN) 20 MG tablet      oxyCODONE-acetaminophen (PERCOCET) 10-325 MG per tablet         ALLERGIES:   Allergies  Allergen Reactions  . Erythromycin Swelling  . Levaquin [Levofloxacin] Other (See Comments)    Tongue turns red and sore  . Penicillins Swelling  . Sulfa Antibiotics Other (See Comments)    Tongue turns red and sore    BRIEF HPI:  See H&P, Labs, Consult and Test reports for  all details in brief, patient was admitted for a mechanical fall, further evaluation revealed fracture of the right femur.  CONSULTATIONS:   orthopedic surgery  PERTINENT RADIOLOGIC STUDIES: Dg Femur Right  10/29/2014   CLINICAL DATA:  ORIF right femur fracture.  EXAM: RIGHT FEMUR - 2 VIEW  COMPARISON:  10/28/2014 radiographs  FINDINGS: Six intraoperative views of the proximal right femur are submitted postoperatively for interpretation.  Internal plate and screw fixation traverses a proximal femoral diaphyseal fracture, in near-anatomic alignment and position.  The femoral component of a right total hip arthroplasty is again noted.  IMPRESSION: Internal fixation of proximal femoral fracture, in near-anatomic alignment and position.    Electronically Signed   By: Hassan Rowan M.D.   On: 10/29/2014 19:17   Dg Femur Right  10/28/2014   CLINICAL DATA:  Slip and fall on rug in bathroom this morning. Right femur pain. Initial encounter.  EXAM: RIGHT FEMUR - 2 VIEW  COMPARISON:  Abdominal radiographs 10/21/2014  FINDINGS: Sequelae of prior right total hip arthroplasty are again identified. There is an acute transverse fracture of the right femoral shaft at the tip of the stem of the femoral prosthesis with slight varus angulation. There is approximately 1/2 shaft width lateral displacement and approximately 1 shaft width posterior displacement with significant overriding. Hip and knee are grossly located. No lytic or blastic osseous lesion is seen. External traction device is in place.  IMPRESSION: Acute right femur fracture at tip of femoral prosthesis with displacement and overriding as above.   Electronically Signed   By: Logan Bores   On: 10/28/2014 15:46   Dg Chest Port 1 View  10/28/2014   CLINICAL DATA:  Fall with possible hip fracture. No chest complaints.  EXAM: PORTABLE CHEST - 1 VIEW  COMPARISON:  07/24/2014.  FINDINGS: Patient is rotated to the RIGHT and this exam has reverse lordotic projection, probably secondary to kyphosis. Aortic atherosclerosis. Cardiopericardial silhouette within normal limits. Monitoring leads project over the chest.  IMPRESSION: No active disease.   Electronically Signed   By: Dereck Ligas M.D.   On: 10/28/2014 16:34   Dg C-arm 61-120 Min  10/29/2014   CLINICAL DATA:  Femur fracture.  EXAM: DG C-ARM 61-120 MIN  TECHNIQUE: Six fluoroscopic spot images from the operating room for noted.  FLUOROSCOPY TIME:  19 seconds  COMPARISON:  10/28/2014  FINDINGS: Placement of lateral plate and multi screw fixation and cerclage wires traversing periprosthetic femur fracture. The alignment is improved compared to preoperative radiographs. Hip arthroplasty partially included in the field of view.  IMPRESSION: Plate and  multi screw fixation fixating right femur fracture just distal to femoral stem of the hip arthroplasty.   Electronically Signed   By: Jeb Levering M.D.   On: 10/29/2014 19:17     PERTINENT LAB RESULTS: CBC:  Recent Labs  10/30/14 0605 10/31/14 0526  WBC 14.2* 11.8*  HGB 10.1* 9.0*  HCT 32.5* 28.6*  PLT 276 216   CMET CMP     Component Value Date/Time   NA 137 10/31/2014 0526   K 3.8 10/31/2014 0526   CL 106 10/31/2014 0526   CO2 28 10/31/2014 0526   GLUCOSE 134* 10/31/2014 0526   BUN 10 10/31/2014 0526   CREATININE 0.64 10/31/2014 0526   CREATININE 0.86 01/02/2013 1233   CALCIUM 8.8 10/31/2014 0526   GFRNONAA 81* 10/31/2014 0526   GFRAA >90 10/31/2014 0526    GFR Estimated Creatinine Clearance: 42.8 mL/min (by C-G formula based on Cr  of 0.64). No results for input(s): LIPASE, AMYLASE in the last 72 hours. No results for input(s): CKTOTAL, CKMB, CKMBINDEX, TROPONINI in the last 72 hours. Invalid input(s): POCBNP No results for input(s): DDIMER in the last 72 hours. No results for input(s): HGBA1C in the last 72 hours. No results for input(s): CHOL, HDL, LDLCALC, TRIG, CHOLHDL, LDLDIRECT in the last 72 hours. No results for input(s): TSH, T4TOTAL, T3FREE, THYROIDAB in the last 72 hours.  Invalid input(s): FREET3 No results for input(s): VITAMINB12, FOLATE, FERRITIN, TIBC, IRON, RETICCTPCT in the last 72 hours. Coags:  Recent Labs  10/30/14 0605 10/31/14 0526  INR 1.17 1.34   Microbiology: Recent Results (from the past 240 hour(s))  Surgical PCR screen     Status: None   Collection Time: 10/29/14  1:34 PM  Result Value Ref Range Status   MRSA, PCR NEGATIVE NEGATIVE Final   Staphylococcus aureus NEGATIVE NEGATIVE Final    Comment:        The Xpert SA Assay (FDA approved for NASAL specimens in patients over 90 years of age), is one component of a comprehensive surveillance program.  Test performance has been validated by [employer] for patients  greater than or equal to 12 year old. It is not intended to diagnose infection nor to guide or monitor treatment.      BRIEF HOSPITAL COURSE:  Right femoral fracture: Secondary to a mechanical fall. Orthopedics consulted, underwent total right hip arthroplasty on 10/29/14. Suspect will require SNF on discharge.Per ortho non weight bearing to right leg for atleast 3 months. No major issues postoperatively, mild perioperative blood loss anemia without the need for PRBC transfusion. Initially required IV Dilaudid for pain, no pain well controlled mostly with oral narcotics. Orthopedics recommending Coumadin for DVT prophylaxis. INR still subtherapeutic, will need INR to be checked in 2 days for further adjustment of Coumadin dosing.   Active Problems:  Anemia: Anemia likely secondary to perioperative blood loss, IV fluids. Continue to monitor in the outpatient setting, suggest repeat CBC in 1 week. Discharge hemoglobin at 9.0     Hypertension: Continue with bisoprolol.   COPD (chronic obstructive pulmonary disease): Lungs are clear, continue with nebs.   Reported history of Congestive heart disease: Not known whether systolic or diastolic, no prior echocardiogram. Remains compensated. On Lasix   TODAY-DAY OF DISCHARGE:  Subjective:   Maciel Slatten today has no headache,no chest abdominal pain,no new weakness tingling or numbness. Continues to have some pain at the operative site mostly when ambulating  Objective:   Blood pressure 107/38, pulse 75, temperature 98.1 F (36.7 C), temperature source Oral, resp. rate 17, height 4' 7.5" (1.41 m), weight 70.2 kg (154 lb 12.2 oz), SpO2 96 %.  Intake/Output Summary (Last 24 hours) at 10/31/14 1020 Last data filed at 10/31/14 0552  Gross per 24 hour  Intake    240 ml  Output    850 ml  Net   -610 ml   Filed Weights   10/28/14 1343 10/29/14 0600  Weight: 53.978 kg (119 lb) 70.2 kg (154 lb 12.2 oz)    Exam Awake Alert, Oriented  *3, No new F.N deficits, Normal affect [place].AT,PERRAL Supple Neck,No JVD, No cervical lymphadenopathy appriciated.  Symmetrical Chest wall movement, Good air movement bilaterally, CTAB RRR,No Gallops,Rubs or new Murmurs, No Parasternal Heave +ve B.Sounds, Abd Soft, Non tender, No organomegaly appriciated, No rebound -guarding or rigidity. No Cyanosis, Clubbing or edema, No new Rash or bruise  DISCHARGE CONDITION: Stable  DISPOSITION: SNF  DISCHARGE INSTRUCTIONS:    Activity:  As tolerated with Full fall precautions use walker/cane & assistance as needed-but nonweightbearing to the right lower extremity  Diet recommendation: Heart Healthy diet  Discharge Instructions    Call MD for:  redness, tenderness, or signs of infection (pain, swelling, redness, odor or green/yellow discharge around incision site)    Complete by:  As directed      Diet - low sodium heart healthy    Complete by:  As directed      Increase activity slowly    Complete by:  As directed   Nonweightbearing to the right lower extremity     Non weight bearing    Complete by:  As directed   Laterality:  right  Extremity:  Lower           Follow-up Information    Follow up with Mcarthur Rossetti, MD. Schedule an appointment as soon as possible for a visit in 2 weeks.   Specialty:  Orthopedic Surgery   Contact information:   [place] [address] [phone]       Follow up with VYAS,DHRUV B., MD. Schedule an appointment as soon as possible for a visit in 1 week.   Specialty:  Internal Medicine   Contact information:   [place] [address] [phone]      Total Time spent on discharge equals 45 minutes.  SignedOren Binet 10/31/2014 10:20 AM

## 2014-10-31 NOTE — Clinical Social Work Note (Signed)
[employer] Medicare authorization has been received [id number] RVB.  Nonnie Done, [place] [phone]  Psychiatric & Orthopedics (5N 1-16) Clinical Social Worker

## 2014-10-31 NOTE — Progress Notes (Signed)
CARE MANAGEMENT NOTE 10/31/2014  Patient:  Hannah Mckee, Hannah Mckee   Account Number:  [id number]  Date Initiated:  10/31/2014  Documentation initiated by:  [facility]  Subjective/Objective Assessment:   femur fx-ORIF rt femur     Action/Plan:   PT/OT evals- recommended SNF   Anticipated DC Date:  10/31/2014   Anticipated DC Plan:  SKILLED NURSING FACILITY  In-house referral  Clinical Social Worker      DC Planning Services  CM consult      Choice offered to / List presented to:             Status of service:  Completed, signed off Medicare Important Message given?   (If response is "NO", the following Medicare IM given date fields will be blank) Date Medicare IM given:   Medicare IM given by:   Date Additional Medicare IM given:   Additional Medicare IM given by:    Discharge Disposition:  [place]

## 2014-10-31 NOTE — Clinical Social Work Note (Signed)
Patient will discharge today, per MD order and will discharge to:  [facility] RN to call report prior to transportation to: [phone] Transportation: [facility] to be scheduled at [place] updated RN.  CSW also updated daughter at bedside.  [facility] will be ready for patient at [place], [place] [phone]  Psychiatric & Orthopedics (5N 1-16) Clinical Social Worker

## 2014-10-31 NOTE — Clinical Social Work Placement (Signed)
Clinical Social Work Department CLINICAL SOCIAL WORK PLACEMENT NOTE 10/31/2014  Patient:  Hannah Mckee, Hannah Mckee  Account Number:  [id number] Admit date:  10/28/2014  Clinical Social Worker:  Wylene Men  Date/time:  10/30/2014 12:39 PM  Clinical Social Work is seeking post-discharge placement for this patient at the following level of care:   SKILLED NURSING   (*CSW will update this form in Epic as items are completed)   10/30/2014  Patient/family provided with [facility] Department of Clinical Social Work's list of facilities offering this level of care within the geographic area requested by the patient (or if unable, by the patient's family).  10/30/2014  Patient/family informed of their freedom to choose among providers that offer the needed level of care, that participate in Medicare, Medicaid or managed care program needed by the patient, have an available bed and are willing to accept the patient.  10/30/2014  Patient/family informed of [facility]' ownership interest in [facility], as well as of the fact that they are under no obligation to receive care at this facility.  PASARR submitted to EDS on 10/30/2014 PASARR number received on 10/30/2014  FL2 transmitted to all facilities in geographic area requested by pt/family on  10/30/2014 FL2 transmitted to all facilities within larger geographic area on   Patient informed that his/her managed care company has contracts with or will negotiate with  certain facilities, including the following:     Patient/family informed of bed offers received:  10/30/2014 Patient chooses bed at [facility] SNF Physician recommends and patient chooses bed at    Patient to be transferred to [place] on  10/31/2014 Patient to be transferred to facility by [facility] Patient and family notified of transfer on 10/31/2014 Name of family member notified:  Hannah Mckee daughter and patient  The following physician request were  entered in Epic:   Additional Comments:  Nonnie Done, [place] [phone]  Psychiatric & Orthopedics (5N 1-16) Clinical Social Worker

## 2014-11-01 LAB — TYPE AND SCREEN
ABO/RH(D): O POS
ANTIBODY SCREEN: POSITIVE
DAT, IgG: NEGATIVE
DONOR AG TYPE: NEGATIVE
Donor AG Type: NEGATIVE
PT AG TYPE: NEGATIVE
UNIT DIVISION: 0
Unit division: 0
Unit division: 0
Unit division: 0

## 2014-11-04 NOTE — Anesthesia Postprocedure Evaluation (Signed)
Anesthesia Post Note  Patient: Hannah Mckee  Procedure(s) Performed: Procedure(s) (LRB): OPEN REDUCTION INTERNAL FIXATION (ORIF) PERIPROSTHETIC FEMUR FRACTURE/FEMUR SHAFT (Right)  Anesthesia type: General  Patient location: PACU  Post pain: Pain level controlled and Adequate analgesia  Post assessment: Post-op Vital signs reviewed, Patient's Cardiovascular Status Stable, Respiratory Function Stable, Patent Airway and Pain level controlled  Last Vitals:  Filed Vitals:   10/31/14 1200  BP:   Pulse:   Temp:   Resp: 17    Post vital signs: Reviewed and stable  Level of consciousness: awake, alert  and oriented  Complications: No apparent anesthesia complications

## 2014-11-12 ENCOUNTER — Ambulatory Visit (INDEPENDENT_AMBULATORY_CARE_PROVIDER_SITE_OTHER): Payer: Medicare Other | Admitting: Internal Medicine

## 2014-12-19 ENCOUNTER — Encounter (INDEPENDENT_AMBULATORY_CARE_PROVIDER_SITE_OTHER): Payer: Self-pay | Admitting: *Deleted

## 2014-12-19 ENCOUNTER — Telehealth (INDEPENDENT_AMBULATORY_CARE_PROVIDER_SITE_OTHER): Payer: Self-pay | Admitting: *Deleted

## 2014-12-19 NOTE — Telephone Encounter (Signed)
Hannah Mckee No Showed for his apt with Dr. Laural Golden on 11/12/14. A NS letter has been mailed.

## 2014-12-19 NOTE — Telephone Encounter (Signed)
Noted. Patient was in hospital r/t to fracture from a recent fall.

## 2016-04-23 ENCOUNTER — Emergency Department (HOSPITAL_COMMUNITY)
Admission: EM | Admit: 2016-04-23 | Discharge: 2016-04-23 | Disposition: A | Discharge status: Home / Self Care | Payer: Medicare Other | Attending: Emergency Medicine | Admitting: Emergency Medicine

## 2016-04-23 ENCOUNTER — Emergency Department (HOSPITAL_COMMUNITY): Payer: Medicare Other

## 2016-04-23 DIAGNOSIS — M79651 Pain in right thigh: Secondary | ICD-10-CM | POA: Insufficient documentation

## 2016-04-23 DIAGNOSIS — Z7901 Long term (current) use of anticoagulants: Secondary | ICD-10-CM | POA: Diagnosis not present

## 2016-04-23 DIAGNOSIS — I110 Hypertensive heart disease with heart failure: Secondary | ICD-10-CM | POA: Insufficient documentation

## 2016-04-23 DIAGNOSIS — M1990 Unspecified osteoarthritis, unspecified site: Secondary | ICD-10-CM | POA: Insufficient documentation

## 2016-04-23 DIAGNOSIS — R079 Chest pain, unspecified: Secondary | ICD-10-CM | POA: Diagnosis not present

## 2016-04-23 DIAGNOSIS — I509 Heart failure, unspecified: Secondary | ICD-10-CM | POA: Diagnosis not present

## 2016-04-23 DIAGNOSIS — J449 Chronic obstructive pulmonary disease, unspecified: Secondary | ICD-10-CM | POA: Insufficient documentation

## 2016-04-23 DIAGNOSIS — M25551 Pain in right hip: Secondary | ICD-10-CM | POA: Diagnosis present

## 2016-04-23 DIAGNOSIS — R0602 Shortness of breath: Secondary | ICD-10-CM | POA: Diagnosis not present

## 2016-04-23 NOTE — ED Notes (Signed)
Dr Zackowski at bedside,  

## 2016-04-23 NOTE — ED Notes (Signed)
I bent over to pick up something and something in my right upper leg started giving me really bad pain.  I was unable to put weight on in until I took a percocet and it started working. I have been walking with my walker.

## 2016-04-23 NOTE — ED Provider Notes (Signed)
CSN: [id number]     Arrival date & time 04/23/16  2027 History  By signing my name below, I, Stephania Fragmin, attest that this documentation has been prepared under the direction and in the presence of Fredia Sorrow, MD. Electronically Signed: Stephania Fragmin, ED Scribe. 04/23/2016. 10:38 PM.   Chief Complaint  Patient presents with  . Leg Pain   Patient is a 82 y.o. female presenting with leg pain. The history is provided by the patient. No language interpreter was used.  Leg Pain Location:  Hip Time since incident:  6 hours Hip location:  R hip Pain details:    Quality: sore.   Radiates to:  Does not radiate   Severity:  Severe ((at its highest))   Onset quality:  Sudden   Duration:  6 hours   Timing:  Constant Relieved by: Percocet. Worsened by:  Bearing weight Ineffective treatments:  None tried Associated symptoms: no back pain and no fever   Risk factors comment:  History of right hip arthroplasty, right femur fracture  HPI Comments: TORIAH LIVERPOOL is a 82 y.o. female with a history of total right hip arthroplasty in 1989 and Orif periprosthetic fracture right femur, hypertension, osteoarthritis, CHF, and COPD, who presents to the Emergency Department complaining of sudden-onset, right hip pain that began about 6 hours ago, at 3 PM, after patient had bent over to pick something up and felt sudden pain. Patient also reports a 10-minute episode of chest pain this morning that was typical of her usual intermittent chest pain, shortness of breath that is baseline for her, leg swelling that is mildly increased from baseline today, and a rash on her bilateral feet. She states she has otherwise been in her usual state of health. She reports no pain presently but states she has pain with standing. She rates her pain as 10/10 at her worst. Patient states she was unable to bear weight afterwards, secondary to the severity of her pain, but states she was able to ambulate after taking a Percocet.  Patient states she has been ambulating with her walker today. She denies fevers, chills, visual changes, cough, rhinorrhea, sore throat, abdominal pain, nausea, vomiting, diarrhea, dysuria, bleeding easily, back pain, or headache.     Past Medical History  Diagnosis Date  . Hypertension   . Sepsis   . Arthritis   . Osteoarthritis   . CHF (congestive heart failure)   . COPD (chronic obstructive pulmonary disease)    Past Surgical History  Procedure Laterality Date  . Tonsillectomy    . Cholecystectomy    . Total hip arthroplasty      1989  . Bunionectomy    . Lithotripsy    . Bilateral cataract surg    . Colonoscopy  11/16/2012    Procedure: COLONOSCOPY;  Surgeon: Rogene Houston, MD;  [facility];  Service: Endoscopy;  Laterality: N/A;  100  . Orif periprosthetic fracture Right 10/29/2014    Procedure: OPEN REDUCTION INTERNAL FIXATION (ORIF) PERIPROSTHETIC FEMUR FRACTURE/FEMUR SHAFT;  Surgeon: Mcarthur Rossetti, MD;  Location: [facility];  Service: Orthopedics;  Laterality: Right;   No family history on file. Social History  Substance Use Topics  . Smoking status: Never Smoker   . Smokeless tobacco: Not on file  . Alcohol Use: No   OB History    No data available     Review of Systems  Constitutional: Negative for fever and chills.  HENT: Negative for rhinorrhea and sore throat.  Eyes: Negative for visual disturbance.  Respiratory: Positive for shortness of breath (baseline). Negative for cough.   Cardiovascular: Positive for chest pain (10 minute episode, typical of usual pain) and leg swelling (mildly increased from baseline).  Gastrointestinal: Negative for nausea, vomiting, abdominal pain and diarrhea.  Genitourinary: Negative for dysuria.  Musculoskeletal: Positive for arthralgias (right hip pain). Negative for back pain.  Skin: Positive for rash.  Neurological: Negative for headaches.  Hematological: Does not bruise/bleed easily.      Allergies   Erythromycin; Levaquin; Penicillins; and Sulfa antibiotics  Home Medications   Prior to Admission medications   Medication Sig Start Date End Date Taking? Authorizing Provider  alendronate (FOSAMAX) 70 MG tablet Take 70 mg by mouth every 7 (seven) days. Take with a full glass of water on an empty stomach.    Historical Provider, MD  bisoprolol (ZEBETA) 5 MG tablet Take 1 tablet (5 mg total) by mouth daily. 10/31/14   Shanker Kristeen Mans, MD  calcium carbonate (OS-CAL) 600 MG TABS Take 600 mg by mouth daily.    Historical Provider, MD  diazepam (VALIUM) 5 MG tablet Take 1 tablet (5 mg total) by mouth every 6 (six) hours as needed for anxiety. Anxiety. 10/31/14   Shanker Kristeen Mans, MD  furosemide (LASIX) 40 MG tablet Take 0.5 tablets (20 mg total) by mouth daily. 10/31/14   Shanker Kristeen Mans, MD  ipratropium-albuterol (DUONEB) 0.5-2.5 (3) MG/3ML SOLN Take 3 mLs by nebulization every 4 (four) hours as needed. 10/31/14   Shanker Kristeen Mans, MD  Linaclotide (LINZESS) 145 MCG CAPS Take 1 capsule (145 mcg total) by mouth as needed. 04/16/13   Butch Penny, NP  methocarbamol (ROBAXIN) 500 MG tablet Take 1 tablet (500 mg total) by mouth every 6 (six) hours as needed for muscle spasms. 10/31/14   Shanker Kristeen Mans, MD  oxyCODONE (OXY IR/ROXICODONE) 5 MG immediate release tablet Take 1-2 tablets (5-10 mg total) by mouth every 4 (four) hours as needed for moderate pain. 10/31/14   Shanker Kristeen Mans, MD  polyethylene glycol (MIRALAX / GLYCOLAX) packet Take 17 g by mouth 2 (two) times daily. 10/31/14   Shanker Kristeen Mans, MD  potassium chloride (KLOR-CON) 20 MEQ packet Take 20 mEq by mouth daily.     Historical Provider, MD  promethazine (PHENERGAN) 25 MG tablet Take 25 mg by mouth every 6 (six) hours as needed for nausea.     Historical Provider, MD  warfarin (COUMADIN) 5 MG tablet Take 1 tablet (5 mg total) by mouth daily at 6 PM. 10/31/14   Mcarthur Rossetti, MD   BP 174/73 mmHg  Pulse 88  Temp(Src)  97.5 F (36.4 C) (Oral)  Resp 20  Ht 4\' 7"  (1.397 m)  Wt 48.263 kg  BMI 24.73 kg/m2  SpO2 98% Physical Exam  Constitutional: She is oriented to person, place, and time. She appears well-developed and well-nourished. No distress.  HENT:  Head: Normocephalic and atraumatic.  Mouth/Throat: Oropharynx is clear and moist.  Mucous membranes are moist.   Eyes: Conjunctivae and EOM are normal. Pupils are equal, round, and reactive to light. No scleral icterus.  Pupils are normal. Scleras are clear. Eyes track normal.    Neck: Neck supple. No tracheal deviation present.  Cardiovascular: Normal rate, regular rhythm and normal heart sounds.   Pulmonary/Chest: Effort normal and breath sounds normal. No respiratory distress. She has no wheezes. She has no rales.  Lungs are clear to auscultation bilaterally.  Abdominal: Soft. Bowel sounds are  normal. There is no tenderness.  Musculoskeletal: Normal range of motion. She exhibits no edema or tenderness.  RLE: No pitting edema. No swelling of right knee. No pain with mediolateral twisting of right knee. No increased pain with flexion, extension, rotation, palpation of right hip and/or thigh. Non-tender.  Neurological: She is alert and oriented to person, place, and time.  Skin: Skin is warm and dry. Rash noted.  Erythematous, scaly rash on bilateral feet.   Psychiatric: She has a normal mood and affect. Her behavior is normal.  Nursing note and vitals reviewed.   ED Course  Procedures (including critical care time)  DIAGNOSTIC STUDIES: Oxygen Saturation is 98% on RA, normal by my interpretation.    COORDINATION OF CARE: 9:37 PM - Discussed treatment plan with pt at bedside which includes x-ray of right leg. Pt verbalized understanding and agreed to plan.   Labs Review Labs Reviewed - No data to display  Imaging Review Dg Hips Bilat With Pelvis 3-4 Views  04/23/2016  CLINICAL DATA:  Acute onset of left hip pain.  Initial encounter. EXAM: DG  HIP (WITH OR WITHOUT PELVIS) 3-4V BILAT COMPARISON:  None. FINDINGS: There is no evidence of fracture or dislocation. The patient's right hip arthroplasty is grossly unremarkable in appearance, with associated plate and cerclage wires along the proximal right femur. There is no evidence of new loosening. The left hip joint is grossly unremarkable in appearance. No significant degenerative change is seen. The visualized bowel gas pattern is grossly unremarkable in appearance. IMPRESSION: 1. No evidence of fracture or dislocation. 2. Right hip arthroplasty is grossly unremarkable in appearance. No evidence of loosening. Electronically Signed   By: Garald Balding M.D.   On: 04/23/2016 22:20   I have personally reviewed and evaluated these images and lab results as part of my medical decision-making.   MDM   Final diagnoses:  Right thigh pain   Patient was sudden onset of some right upper thigh pain. X-ray showed no evidence of any bony injuries. T scan would not be helpful due to all the metal. Patient currently asymptomatic. Patient has pain medicines at home. Patient can follow-up with her primary care doctor orthopedic doctor if symptoms persist. She will return for any new or worse symptoms. Patient is not on Coumadin.  I personally performed the services described in this documentation, which was scribed in my presence. The recorded information has been reviewed and is accurate.       Fredia Sorrow, MD 04/23/16 2312

## 2016-04-23 NOTE — ED Notes (Signed)
Pt states that she had surgery to her right thigh 18 months ago, had been doing fine until today when she bent over and felt pain in right upper thigh area, cms intact distal,

## 2016-04-23 NOTE — Discharge Instructions (Signed)
X-rays show no evidence of any bony injuries to the right femur pelvis or hip. Take pain medicine as needed. Follow-up with orthopedics or your primary care doctor is symptoms persist. Return for any new or worse symptoms.

## 2016-10-04 ENCOUNTER — Encounter (HOSPITAL_COMMUNITY): Payer: Self-pay | Admitting: Emergency Medicine

## 2016-10-04 ENCOUNTER — Inpatient Hospital Stay (HOSPITAL_COMMUNITY)
Admission: EM | Admit: 2016-10-04 | Discharge: 2016-10-11 | DRG: 871 | Disposition: A | Discharge status: Home Health | Payer: Medicare Other | Attending: Internal Medicine | Admitting: Internal Medicine

## 2016-10-04 ENCOUNTER — Emergency Department (HOSPITAL_COMMUNITY): Payer: Medicare Other

## 2016-10-04 DIAGNOSIS — I248 Other forms of acute ischemic heart disease: Secondary | ICD-10-CM | POA: Diagnosis present

## 2016-10-04 DIAGNOSIS — Z7983 Long term (current) use of bisphosphonates: Secondary | ICD-10-CM

## 2016-10-04 DIAGNOSIS — Z96649 Presence of unspecified artificial hip joint: Secondary | ICD-10-CM | POA: Diagnosis present

## 2016-10-04 DIAGNOSIS — I5021 Acute systolic (congestive) heart failure: Secondary | ICD-10-CM | POA: Diagnosis present

## 2016-10-04 DIAGNOSIS — E440 Moderate protein-calorie malnutrition: Secondary | ICD-10-CM | POA: Diagnosis present

## 2016-10-04 DIAGNOSIS — Z79899 Other long term (current) drug therapy: Secondary | ICD-10-CM

## 2016-10-04 DIAGNOSIS — J9601 Acute respiratory failure with hypoxia: Secondary | ICD-10-CM | POA: Diagnosis present

## 2016-10-04 DIAGNOSIS — J189 Pneumonia, unspecified organism: Secondary | ICD-10-CM | POA: Diagnosis present

## 2016-10-04 DIAGNOSIS — I427 Cardiomyopathy due to drug and external agent: Secondary | ICD-10-CM | POA: Diagnosis not present

## 2016-10-04 DIAGNOSIS — J9621 Acute and chronic respiratory failure with hypoxia: Secondary | ICD-10-CM | POA: Diagnosis present

## 2016-10-04 DIAGNOSIS — M1990 Unspecified osteoarthritis, unspecified site: Secondary | ICD-10-CM | POA: Diagnosis present

## 2016-10-04 DIAGNOSIS — J441 Chronic obstructive pulmonary disease with (acute) exacerbation: Secondary | ICD-10-CM | POA: Diagnosis present

## 2016-10-04 DIAGNOSIS — I509 Heart failure, unspecified: Secondary | ICD-10-CM | POA: Diagnosis not present

## 2016-10-04 DIAGNOSIS — A419 Sepsis, unspecified organism: Principal | ICD-10-CM | POA: Diagnosis present

## 2016-10-04 DIAGNOSIS — E876 Hypokalemia: Secondary | ICD-10-CM | POA: Diagnosis present

## 2016-10-04 DIAGNOSIS — E0590 Thyrotoxicosis, unspecified without thyrotoxic crisis or storm: Secondary | ICD-10-CM | POA: Diagnosis present

## 2016-10-04 DIAGNOSIS — Z79891 Long term (current) use of opiate analgesic: Secondary | ICD-10-CM | POA: Diagnosis not present

## 2016-10-04 DIAGNOSIS — R7989 Other specified abnormal findings of blood chemistry: Secondary | ICD-10-CM | POA: Diagnosis present

## 2016-10-04 DIAGNOSIS — E878 Other disorders of electrolyte and fluid balance, not elsewhere classified: Secondary | ICD-10-CM | POA: Diagnosis present

## 2016-10-04 DIAGNOSIS — R0603 Acute respiratory distress: Secondary | ICD-10-CM

## 2016-10-04 DIAGNOSIS — Z880 Allergy status to penicillin: Secondary | ICD-10-CM | POA: Diagnosis not present

## 2016-10-04 DIAGNOSIS — M6281 Muscle weakness (generalized): Secondary | ICD-10-CM | POA: Diagnosis present

## 2016-10-04 DIAGNOSIS — J440 Chronic obstructive pulmonary disease with acute lower respiratory infection: Secondary | ICD-10-CM | POA: Diagnosis present

## 2016-10-04 DIAGNOSIS — Z6824 Body mass index (BMI) 24.0-24.9, adult: Secondary | ICD-10-CM

## 2016-10-04 DIAGNOSIS — R2689 Other abnormalities of gait and mobility: Secondary | ICD-10-CM

## 2016-10-04 DIAGNOSIS — R748 Abnormal levels of other serum enzymes: Secondary | ICD-10-CM | POA: Diagnosis not present

## 2016-10-04 DIAGNOSIS — J181 Lobar pneumonia, unspecified organism: Secondary | ICD-10-CM | POA: Diagnosis present

## 2016-10-04 DIAGNOSIS — Z881 Allergy status to other antibiotic agents status: Secondary | ICD-10-CM

## 2016-10-04 DIAGNOSIS — Z7901 Long term (current) use of anticoagulants: Secondary | ICD-10-CM | POA: Diagnosis not present

## 2016-10-04 DIAGNOSIS — I472 Ventricular tachycardia: Secondary | ICD-10-CM | POA: Diagnosis present

## 2016-10-04 DIAGNOSIS — J42 Unspecified chronic bronchitis: Secondary | ICD-10-CM | POA: Diagnosis not present

## 2016-10-04 DIAGNOSIS — R000 Tachycardia, unspecified: Secondary | ICD-10-CM | POA: Diagnosis present

## 2016-10-04 DIAGNOSIS — E873 Alkalosis: Secondary | ICD-10-CM | POA: Diagnosis present

## 2016-10-04 DIAGNOSIS — I110 Hypertensive heart disease with heart failure: Secondary | ICD-10-CM | POA: Diagnosis present

## 2016-10-04 DIAGNOSIS — Z882 Allergy status to sulfonamides status: Secondary | ICD-10-CM | POA: Diagnosis not present

## 2016-10-04 DIAGNOSIS — R778 Other specified abnormalities of plasma proteins: Secondary | ICD-10-CM | POA: Diagnosis present

## 2016-10-04 HISTORY — DX: Pneumonia, unspecified organism: J18.9

## 2016-10-04 HISTORY — DX: Calculus of kidney: N20.0

## 2016-10-04 LAB — TROPONIN I
TROPONIN I: 0.04 ng/mL — AB (ref <0.03)
TROPONIN I: 0.04 ng/mL — AB (ref <0.03)

## 2016-10-04 LAB — CBC WITH DIFFERENTIAL/PLATELET
Basophils Absolute: 0.1 10*3/uL (ref 0.0–0.1)
Basophils Relative: 0 %
Eosinophils Absolute: 0.1 10*3/uL (ref 0.0–0.7)
Eosinophils Relative: 1 %
HEMATOCRIT: 38 % (ref 36.0–46.0)
HEMOGLOBIN: 12.2 g/dL (ref 12.0–15.0)
LYMPHS PCT: 5 %
Lymphs Abs: 0.9 10*3/uL (ref 0.7–4.0)
MCH: 28.3 pg (ref 26.0–34.0)
MCHC: 32.1 g/dL (ref 30.0–36.0)
MCV: 88.2 fL (ref 78.0–100.0)
MONO ABS: 1.5 10*3/uL — AB (ref 0.1–1.0)
MONOS PCT: 8 %
NEUTROS ABS: 17.1 10*3/uL — AB (ref 1.7–7.7)
Neutrophils Relative %: 86 %
Platelets: 368 10*3/uL (ref 150–400)
RBC: 4.31 MIL/uL (ref 3.87–5.11)
RDW: 14.5 % (ref 11.5–15.5)
WBC: 19.7 10*3/uL — ABNORMAL HIGH (ref 4.0–10.5)

## 2016-10-04 LAB — COMPREHENSIVE METABOLIC PANEL
ALK PHOS: 126 U/L (ref 38–126)
ALT: 11 U/L — ABNORMAL LOW (ref 14–54)
ANION GAP: 10 (ref 5–15)
AST: 17 U/L (ref 15–41)
Albumin: 3.2 g/dL — ABNORMAL LOW (ref 3.5–5.0)
BILIRUBIN TOTAL: 0.8 mg/dL (ref 0.3–1.2)
BUN: 11 mg/dL (ref 6–20)
CALCIUM: 9.3 mg/dL (ref 8.9–10.3)
CO2: 26 mmol/L (ref 22–32)
Chloride: 99 mmol/L — ABNORMAL LOW (ref 101–111)
Creatinine, Ser: 0.69 mg/dL (ref 0.44–1.00)
GFR calc Af Amer: >60 mL/min (ref >60)
Glucose, Bld: 97 mg/dL (ref 65–99)
POTASSIUM: 3.3 mmol/L — AB (ref 3.5–5.1)
Sodium: 135 mmol/L (ref 135–145)
Total Protein: 7.6 g/dL (ref 6.5–8.1)

## 2016-10-04 LAB — URINE MICROSCOPIC-ADD ON

## 2016-10-04 LAB — BLOOD GAS, ARTERIAL
ACID-BASE DEFICIT: 1.9 mmol/L (ref 0.0–2.0)
Bicarbonate: 22.8 mmol/L (ref 20.0–28.0)
DRAWN BY: 21310
O2 CONTENT: 2 L/min
O2 SAT: 94.9 %
PATIENT TEMPERATURE: 37
PCO2 ART: 39.6 mmHg (ref 32.0–48.0)
pH, Arterial: 7.374 (ref 7.350–7.450)
pO2, Arterial: 77.9 mmHg — ABNORMAL LOW (ref 83.0–108.0)

## 2016-10-04 LAB — INFLUENZA PANEL BY PCR (TYPE A & B)
INFLAPCR: NEGATIVE
Influenza B By PCR: NEGATIVE

## 2016-10-04 LAB — BRAIN NATRIURETIC PEPTIDE: B Natriuretic Peptide: 459 pg/mL — ABNORMAL HIGH (ref 0.0–100.0)

## 2016-10-04 LAB — URINALYSIS, ROUTINE W REFLEX MICROSCOPIC
Glucose, UA: NEGATIVE mg/dL
Ketones, ur: >80 mg/dL — AB
Nitrite: NEGATIVE
PROTEIN: 100 mg/dL — AB
SPECIFIC GRAVITY, URINE: 1.02 (ref 1.005–1.030)
pH: 6 (ref 5.0–8.0)

## 2016-10-04 LAB — I-STAT CG4 LACTIC ACID, ED: Lactic Acid, Venous: 0.97 mmol/L (ref 0.5–1.9)

## 2016-10-04 MED ORDER — VANCOMYCIN HCL IN DEXTROSE 1-5 GM/200ML-% IV SOLN
1000.0000 mg | Freq: Once | INTRAVENOUS | Status: AC
  Administered 2016-10-05: 1000 mg via INTRAVENOUS

## 2016-10-04 MED ORDER — DEXTROSE 5 % IV SOLN
500.0000 mg | INTRAVENOUS | Status: AC
  Administered 2016-10-05 – 2016-10-11 (×7): 500 mg via INTRAVENOUS
  Filled 2016-10-04 (×7): qty 500

## 2016-10-04 MED ORDER — ALBUTEROL SULFATE (2.5 MG/3ML) 0.083% IN NEBU
2.5000 mg | INHALATION_SOLUTION | RESPIRATORY_TRACT | Status: DC | PRN
  Administered 2016-10-04 – 2016-10-09 (×4): 2.5 mg via RESPIRATORY_TRACT
  Filled 2016-10-04 (×4): qty 3

## 2016-10-04 MED ORDER — POTASSIUM CHLORIDE CRYS ER 20 MEQ PO TBCR
20.0000 meq | EXTENDED_RELEASE_TABLET | ORAL | Status: DC
  Administered 2016-10-06 – 2016-10-11 (×3): 20 meq via ORAL
  Filled 2016-10-04 (×3): qty 1

## 2016-10-04 MED ORDER — DEXTROSE 5 % IV SOLN: 1.0000 g | Freq: Two times a day (BID) | INTRAVENOUS | Status: DC

## 2016-10-04 MED ORDER — VANCOMYCIN HCL IN DEXTROSE 1-5 GM/200ML-% IV SOLN: 1000.0000 mg | Freq: Once | INTRAVENOUS | Status: DC

## 2016-10-04 MED ORDER — CLONAZEPAM 0.25 MG PO TBDP: 0.1250 mg | ORAL_TABLET | Freq: Every day | ORAL | Status: DC | PRN

## 2016-10-04 MED ORDER — DOXYCYCLINE HYCLATE 100 MG IV SOLR
100.0000 mg | Freq: Two times a day (BID) | INTRAVENOUS | Status: DC
  Administered 2016-10-04: 100 mg via INTRAVENOUS
  Filled 2016-10-04 (×2): qty 100

## 2016-10-04 MED ORDER — SODIUM CHLORIDE 0.9 % IV BOLUS (SEPSIS): 1000.0000 mL | Freq: Once | INTRAVENOUS | Status: DC

## 2016-10-04 MED ORDER — ASPIRIN EC 81 MG PO TBEC
81.0000 mg | DELAYED_RELEASE_TABLET | Freq: Every day | ORAL | Status: DC
  Administered 2016-10-05 – 2016-10-11 (×7): 81 mg via ORAL
  Filled 2016-10-04 (×7): qty 1

## 2016-10-04 MED ORDER — POLYETHYLENE GLYCOL 3350 17 G PO PACK: 17.0000 g | PACK | Freq: Every day | ORAL | Status: DC | PRN

## 2016-10-04 MED ORDER — DEXTROSE 5 % IV SOLN
1.0000 g | Freq: Once | INTRAVENOUS | Status: AC
  Administered 2016-10-04: 1 g via INTRAVENOUS
  Filled 2016-10-04: qty 10

## 2016-10-04 MED ORDER — DOXYCYCLINE HYCLATE 100 MG PO TABS: 100.0000 mg | ORAL_TABLET | Freq: Once | ORAL | Status: DC

## 2016-10-04 MED ORDER — VANCOMYCIN HCL IN DEXTROSE 750-5 MG/150ML-% IV SOLN
750.0000 mg | INTRAVENOUS | Status: DC
  Administered 2016-10-05: 750 mg via INTRAVENOUS
  Filled 2016-10-04 (×2): qty 150

## 2016-10-04 MED ORDER — VANCOMYCIN HCL IN DEXTROSE 750-5 MG/150ML-% IV SOLN: 750.0000 mg | INTRAVENOUS | Status: DC

## 2016-10-04 MED ORDER — HYDROCODONE-ACETAMINOPHEN 5-325 MG PO TABS
0.5000 | ORAL_TABLET | Freq: Every day | ORAL | Status: DC | PRN
  Administered 2016-10-09: 1 via ORAL
  Filled 2016-10-04: qty 1

## 2016-10-04 MED ORDER — POTASSIUM CHLORIDE CRYS ER 20 MEQ PO TBCR
40.0000 meq | EXTENDED_RELEASE_TABLET | Freq: Once | ORAL | Status: AC
  Administered 2016-10-05: 40 meq via ORAL
  Filled 2016-10-04: qty 2

## 2016-10-04 MED ORDER — VANCOMYCIN HCL IN DEXTROSE 1-5 GM/200ML-% IV SOLN
1000.0000 mg | Freq: Once | INTRAVENOUS | Status: DC
  Filled 2016-10-04: qty 200

## 2016-10-04 MED ORDER — IPRATROPIUM-ALBUTEROL 0.5-2.5 (3) MG/3ML IN SOLN
3.0000 mL | Freq: Four times a day (QID) | RESPIRATORY_TRACT | Status: DC
  Administered 2016-10-05 (×3): 3 mL via RESPIRATORY_TRACT
  Filled 2016-10-04 (×3): qty 3

## 2016-10-04 MED ORDER — VANCOMYCIN HCL IN DEXTROSE 750-5 MG/150ML-% IV SOLN
INTRAVENOUS | Status: AC
  Filled 2016-10-04: qty 150

## 2016-10-04 MED ORDER — SODIUM CHLORIDE 0.9 % IV BOLUS (SEPSIS)
500.0000 mL | Freq: Once | INTRAVENOUS | Status: AC
  Administered 2016-10-04: 1000 mL via INTRAVENOUS

## 2016-10-04 MED ORDER — LACTATED RINGERS IV SOLN
INTRAVENOUS | Status: DC
  Administered 2016-10-04 – 2016-10-06 (×2): via INTRAVENOUS

## 2016-10-04 MED ORDER — ENOXAPARIN SODIUM 40 MG/0.4ML ~~LOC~~ SOLN
40.0000 mg | SUBCUTANEOUS | Status: DC
  Administered 2016-10-05 – 2016-10-10 (×8): 40 mg via SUBCUTANEOUS
  Filled 2016-10-04 (×8): qty 0.4

## 2016-10-04 MED ORDER — DOXYCYCLINE HYCLATE 100 MG IV SOLR
INTRAVENOUS | Status: AC
  Filled 2016-10-04: qty 100

## 2016-10-04 MED ORDER — DEXTROSE 5 % IV SOLN
INTRAVENOUS | Status: AC
  Filled 2016-10-04: qty 500

## 2016-10-04 NOTE — ED Notes (Signed)
Lab having issues with computer causing delay of blood cultures

## 2016-10-04 NOTE — H&P (Signed)
History and Physical    Hannah Mckee [id number] DOB: Jul 28, 1931 DOA: 10/04/2016  PCP: Glenda Chroman, MD Consultants:  None Patient coming from: home - lives alone; NOK: daughter, [phone]  Chief Complaint: SOB  HPI: Hannah Mckee is a 82 y.o. female with medical history significant of femur fracture; HTN; COPD (not on home O2); CHF (no echo available) presenting with PNA.  Patient got sick 2 days after taking a flu shot (about 11/10).  Does lots of cooking, baking, making candy, daughter thought she had worn herself out.  The last week, more flu-like, has never felt so bad in her whole life.  Coughing up bloody mucus.  2 nights where she wasn't sure she was going to wake up in the morning.  Reports gasping for breath, "almost smothered to death."  Nebs 4x/day, prior used on rarely.  Nebs do loosen sputum.  Sputum is dark brown, burgundy, or gray.   Also with painful ?mass in side. The only way to make breathing better was to sit quietly alone.  Unable to lie flat.  Can't find thermometer - has not felt like she has fever but has had chills.  Had 20 days of abx for bladder infection prior to flu shot.     ED Course: Per Dr. Kathrynn Humble: Pt appears to have a CAP - cough/elevated WC/CXR with consolidation/poor lung sounds.  Pretest probability for PE is low.  CAP tx started.  Admit.   Review of Systems: As per HPI; otherwise 10 point review of systems reviewed and negative.   Ambulatory Status:  Ambulates with a walker  Past Medical History:  Diagnosis Date  . CHF (congestive heart failure) ([place])   . COPD (chronic obstructive pulmonary disease) ([facility])    no home O2  . Hypertension   . Kidney stones   . Osteoarthritis   . Sepsis [facility])     Past Surgical History:  Procedure Laterality Date  . bilateral cataract surg    . BUNIONECTOMY    . CHOLECYSTECTOMY    . COLONOSCOPY  11/16/2012   Procedure: COLONOSCOPY;  Surgeon: Rogene Houston, MD;  [facility];   Service: Endoscopy;  Laterality: N/A;  100  . LITHOTRIPSY    . ORIF PERIPROSTHETIC FRACTURE Right 10/29/2014   Procedure: OPEN REDUCTION INTERNAL FIXATION (ORIF) PERIPROSTHETIC FEMUR FRACTURE/FEMUR SHAFT;  Surgeon: Mcarthur Rossetti, MD;  Location: [facility];  Service: Orthopedics;  Laterality: Right;  . TONSILLECTOMY    . TOTAL HIP ARTHROPLASTY     1989    Social History   Social History  . Marital status: Divorced    Spouse name: N/A  . Number of children: N/A  . Years of education: N/A   Occupational History  . Not on file.   Social History Main Topics  . Smoking status: Never Smoker  . Smokeless tobacco: Never Used  . Alcohol use No  . Drug use: No  . Sexual activity: Not on file   Other Topics Concern  . Not on file   Social History Narrative  . No narrative on file    Allergies  Allergen Reactions  . Erythromycin Swelling  . Levaquin [Levofloxacin] Other (See Comments)    Tongue turns red and sore  . Penicillins Swelling    Has patient had a PCN reaction causing immediate rash, facial/tongue/throat swelling, SOB or lightheadedness with hypotension: Yes Has patient had a PCN reaction causing severe rash involving mucus membranes or skin necrosis: No Has patient had a  PCN reaction that required hospitalization No Has patient had a PCN reaction occurring within the last 10 years: No If all of the above answers are "NO", then may proceed with Cephalosporin use.   . Sulfa Antibiotics Other (See Comments)    Tongue turns red and sore    History reviewed. No pertinent family history.  Prior to Admission medications   Medication Sig Start Date End Date Taking? Authorizing Provider  alendronate (FOSAMAX) 70 MG tablet Take 70 mg by mouth every 7 (seven) days. Take with a full glass of water on an empty stomach.    Historical Provider, MD  bisoprolol (ZEBETA) 5 MG tablet Take 1 tablet (5 mg total) by mouth daily. 10/31/14   Shanker Kristeen Mans, MD  calcium carbonate  (OS-CAL) 600 MG TABS Take 600 mg by mouth daily.    Historical Provider, MD  diazepam (VALIUM) 5 MG tablet Take 1 tablet (5 mg total) by mouth every 6 (six) hours as needed for anxiety. Anxiety. 10/31/14   Shanker Kristeen Mans, MD  furosemide (LASIX) 40 MG tablet Take 0.5 tablets (20 mg total) by mouth daily. 10/31/14   Shanker Kristeen Mans, MD  ipratropium-albuterol (DUONEB) 0.5-2.5 (3) MG/3ML SOLN Take 3 mLs by nebulization every 4 (four) hours as needed. 10/31/14   Shanker Kristeen Mans, MD  Linaclotide (LINZESS) 145 MCG CAPS Take 1 capsule (145 mcg total) by mouth as needed. 04/16/13   Butch Penny, NP  methocarbamol (ROBAXIN) 500 MG tablet Take 1 tablet (500 mg total) by mouth every 6 (six) hours as needed for muscle spasms. 10/31/14   Shanker Kristeen Mans, MD  oxyCODONE (OXY IR/ROXICODONE) 5 MG immediate release tablet Take 1-2 tablets (5-10 mg total) by mouth every 4 (four) hours as needed for moderate pain. 10/31/14   Shanker Kristeen Mans, MD  polyethylene glycol (MIRALAX / GLYCOLAX) packet Take 17 g by mouth 2 (two) times daily. 10/31/14   Shanker Kristeen Mans, MD  potassium chloride (KLOR-CON) 20 MEQ packet Take 20 mEq by mouth daily.     Historical Provider, MD  promethazine (PHENERGAN) 25 MG tablet Take 25 mg by mouth every 6 (six) hours as needed for nausea.     Historical Provider, MD  warfarin (COUMADIN) 5 MG tablet Take 1 tablet (5 mg total) by mouth daily at 6 PM. 10/31/14   Mcarthur Rossetti, MD    Physical Exam: Vitals:   10/04/16 2129 10/04/16 2130 10/04/16 2200 10/04/16 2230  BP: (!) 139/50 135/60 (!) 127/50 151/58  Pulse: 104 105 103 114  Resp: 22 24 (!) 29 (!) 30  Temp: 98.9 F (37.2 C)     TempSrc: Oral     SpO2: 100% 99% 100% 99%  Weight:      Height:         General: Somewhat agitated and disgruntled; requesting to sit up at the bedside because she is having difficulty catching her breath despite normal O2 sats Eyes:  PERRL, EOMI, normal lids, iris ENT:  grossly normal  hearing, lips & tongue, mmm Neck:  no LAD, masses or thyromegaly Cardiovascular:  tachycardia, no m/r/g. No LE edema.  Respiratory: Diminished breath sounds in LLL. Tachypnea.  No current wheezing Abdomen:  soft, ntnd, NABS Skin:  no rash or induration seen on limited exam Musculoskeletal:  grossly normal tone BUE/BLE, good ROM, no bony abnormality Psychiatric: cantankerous, speech limited by dyspnea, AOx3 Neurologic:  CN 2-12 grossly intact, moves all extremities in coordinated fashion, sensation intact  Labs on Admission: I  have personally reviewed following labs and imaging studies  CBC:  Recent Labs Lab 10/04/16 1725  WBC 19.7*  NEUTROABS 17.1*  HGB 12.2  HCT 38.0  MCV 88.2  PLT 123XX123   Basic Metabolic Panel:  Recent Labs Lab 10/04/16 1725  NA 135  K 3.3*  CL 99*  CO2 26  GLUCOSE 97  BUN 11  CREATININE 0.69  CALCIUM 9.3   GFR: Estimated Creatinine Clearance: 33.5 mL/min (by C-G formula based on SCr of 0.69 mg/dL). Liver Function Tests:  Recent Labs Lab 10/04/16 1725  AST 17  ALT 11*  ALKPHOS 126  BILITOT 0.8  PROT 7.6  ALBUMIN 3.2*   No results for input(s): LIPASE, AMYLASE in the last 168 hours. No results for input(s): AMMONIA in the last 168 hours. Coagulation Profile: No results for input(s): INR, PROTIME in the last 168 hours. Cardiac Enzymes:  Recent Labs Lab 10/04/16 1725 10/04/16 2040  TROPONINI 0.04* 0.04*   BNP (last 3 results) No results for input(s): PROBNP in the last 8760 hours. HbA1C: No results for input(s): HGBA1C in the last 72 hours. CBG: No results for input(s): GLUCAP in the last 168 hours. Lipid Profile: No results for input(s): CHOL, HDL, LDLCALC, TRIG, CHOLHDL, LDLDIRECT in the last 72 hours. Thyroid Function Tests: No results for input(s): TSH, T4TOTAL, FREET4, T3FREE, THYROIDAB in the last 72 hours. Anemia Panel: No results for input(s): VITAMINB12, FOLATE, FERRITIN, TIBC, IRON, RETICCTPCT in the last 72  hours. Urine analysis:    Component Value Date/Time   COLORURINE YELLOW 10/04/2016 1928   APPEARANCEUR CLEAR 10/04/2016 1928   LABSPEC 1.020 10/04/2016 1928   PHURINE 6.0 10/04/2016 1928   GLUCOSEU NEGATIVE 10/04/2016 1928   HGBUR LARGE (A) 10/04/2016 1928   BILIRUBINUR SMALL (A) 10/04/2016 1928   KETONESUR >80 (A) 10/04/2016 1928   PROTEINUR 100 (A) 10/04/2016 1928   NITRITE NEGATIVE 10/04/2016 1928   LEUKOCYTESUR TRACE (A) 10/04/2016 1928    Creatinine Clearance: Estimated Creatinine Clearance: 33.5 mL/min (by C-G formula based on SCr of 0.69 mg/dL).  Sepsis Labs: @LABRCNTIP (procalcitonin:4,lacticidven:4) ) Recent Results (from the past 240 hour(s))  Blood Culture (routine x 2)     Status: None (Preliminary result)   Collection Time: 10/04/16  8:40 PM  Result Value Ref Range Status   Specimen Description LEFT ANTECUBITAL  Final   Special Requests BOTTLES DRAWN AEROBIC AND ANAEROBIC 6CC  Final   Culture PENDING  Incomplete   Report Status PENDING  Incomplete  Blood Culture (routine x 2)     Status: None (Preliminary result)   Collection Time: 10/04/16  8:44 PM  Result Value Ref Range Status   Specimen Description BLOOD LEFT HAND  Final   Special Requests BOTTLES DRAWN AEROBIC AND ANAEROBIC 6CC  Final   Culture PENDING  Incomplete   Report Status PENDING  Incomplete     Radiological Exams on Admission: Dg Chest 2 View  Result Date: 10/04/2016 CLINICAL DATA:  Shortness of breath . EXAM: CHEST  2 VIEW COMPARISON:  10/28/2014. FINDINGS: Mediastinum hilar structures normal. Cardiomegaly with mild bibasilar infiltrates small pleural effusions suggesting congestive heart failure. Low lung volumes. No pneumothorax. Thoracic spine kyphosis and diffuse osteopenia. IMPRESSION: 1. Cardiomegaly. Mild mild basilar infiltrates and/or edema with small pleural effusions. Congestive heart failure cannot be excluded. 2. Low lung volumes. Electronically Signed   By: Marcello Moores  Register   On:  10/04/2016 16:00    EKG: Independently reviewed.  Sinus tachycardia with rate 110; nonspecific ST changes with no evidence  of acute ischemia  Assessment/Plan Principal Problem:   Acute respiratory failure with hypoxia (HCC) Active Problems:   CHF (congestive heart failure) (HCC)   COPD (chronic obstructive pulmonary disease) (HCC)   CAP (community acquired pneumonia)   Sepsis ([place])   Elevated troponin   Hypokalemia   Moderate malnutrition (HCC)   Acute respiratory failure from CAP leading to sepsis -Elevated WBC count, tachycardia, tachypnea with normal lactate of 0.97 and normal blood pressures -Sepsis protocol initiated by ER -CAP: Given productive cough, mildly decreased oxygen saturation, and infiltrate in left lower lobe on PE and chest x-ray , most likely community-acquired pneumonia.  -She does have hemoptysis and so consult to pulmonology placed for tomorrow AM. -Multiple drug allergies; was given Rocephin, Doxy, and Vanc in ER.  Based on ICU-CAP algorithm, will continue Rocephin/Azithro/Vanc for now. -Patient with a Pneumonia Severity Index score of 111, Class IV mortality risk, 9.3%. -Based on patient's description of this ongoing sensation of smothering and feeling the need to tripod sit despite normal O2 sats, will check ABG now. -There is a reasonable chance that the patient will require more intensive respiratory support. -The patient and daughter have been counseled at length about the severity of her illness, including the potential for intubation and/or death. - Repeat CBC in am - Sputum cultures - Blood cultures - albuterol PRN - Duonebs q6h -Will admit to SDU for more intensive monitoring.  CHF -Reported h/o CHF but no prior Echo results available  -Elevated BNP 459, no prior available -Does not appear to be volume overloaded on PE - no LE edema, no crackles appreciated -Will order Echo for tomorrow -Also with elevated troponin 0.04 x 2, will trend and repeat  EKG in AM; denies CP and unremarkable EKG at this time  Hypokalemia -K 3.3 -Will replete with 40 mEq PO KCL -Recheck BMP in AM  Moderate malnutrition -Nutrition consult   DVT prophylaxis: Lovenox  Code Status:  Full - confirmed with patient/family Family Communication: Daughter present throughout  Disposition Plan:  Home once clinically improved Consults called: Pulmonology  Admission status: Admit - It is my clinical opinion that admission to INPATIENT is reasonable and necessary because this patient will require at least 2 midnights in the hospital to treat this condition based on the medical complexity of the problems presented.  Given the aforementioned information, the predictability of an adverse outcome is felt to be significant.     Karmen Bongo MD [facility]  If 7PM-7AM, please contact night-coverage [email or web address] Password [email or web address]  10/04/2016, 11:05 PM

## 2016-10-04 NOTE — Progress Notes (Signed)
Pharmacy Antibiotic Note  Hannah Mckee is a 82 y.o. female presented to ED on 10/04/2016 with sepsis.  Pharmacy has been consulted for Vancomycin dosing.  Plan: Vancomycin 1gm IV x 1 Vancomycin 750 mg IV every 24 hours.  Goal trough 15-20 mcg/mL. Monitor labs, micro and vitals.   Height: 4\' 7"  (139.7 cm) Weight: 107 lb (48.5 kg) IBW/kg (Calculated) : 34  Temp (24hrs), Avg:98.6 F (37 C), Min:97.9 F (36.6 C), Max:99.3 F (37.4 C)   Recent Labs Lab 10/04/16 1725  WBC 19.7*  CREATININE 0.69    Estimated Creatinine Clearance: 33.5 mL/min (by C-G formula based on SCr of 0.69 mg/dL).    Allergies  Allergen Reactions  . Erythromycin Swelling  . Levaquin [Levofloxacin] Other (See Comments)    Tongue turns red and sore  . Penicillins Swelling  . Sulfa Antibiotics Other (See Comments)    Tongue turns red and sore    Antimicrobials this admission: Vancomycin  12/4 >>  Doxy 12/4 >>   Dose adjustments this admission: n/a   Microbiology results:   Thank you for allowing pharmacy to be a part of this patient's care.  Pricilla Larsson 10/04/2016 7:37 PM

## 2016-10-04 NOTE — ED Provider Notes (Signed)
[facility] DEPT Provider Note   CSN: [id number] Arrival date & time: 10/04/16  1523     History   Chief Complaint Chief Complaint  Patient presents with  . Shortness of Breath    HPI Hannah Mckee is a 82 y.o. female.  HPI Pt with hx of CHF, COPD comes in with cc of dib. Pt has been sick for few days. She has had worsening cough and increased shortness of breath. She had some blood tinged sputum initially, but that has improved. Pt has no hx of PE, DVT and denies any exogenous estrogen use, long distance travels or surgery in the past 6 weeks, active cancer, recent immobilization. Pt has no chest pain. Pt did get a flu shot, and reports she got sick   After the shot. Past Medical History:  Diagnosis Date  . CHF (congestive heart failure) ([place])   . COPD (chronic obstructive pulmonary disease) ([facility])    no home O2  . Hypertension   . Kidney stones   . Osteoarthritis   . Sepsis [facility])     Patient Active Problem List   Diagnosis Date Noted  . CAP (community acquired pneumonia) 10/04/2016  . Sepsis ([place]) 10/04/2016  . Acute respiratory failure with hypoxia ([place]) 10/04/2016  . Elevated troponin 10/04/2016  . Hypokalemia 10/04/2016  . Moderate malnutrition ([place]) 10/04/2016  . Right femoral fracture ([place]) 10/28/2014  . CHF (congestive heart failure) ([place])   . COPD (chronic obstructive pulmonary disease) ([place])   . Fall   . Unspecified constipation 04/16/2013  . Diverticulitis of colon without hemorrhage 04/16/2013  . Rectal bleed 11/06/2012  . Hypertension 11/06/2012    Past Surgical History:  Procedure Laterality Date  . bilateral cataract surg    . BUNIONECTOMY    . CHOLECYSTECTOMY    . COLONOSCOPY  11/16/2012   Procedure: COLONOSCOPY;  Surgeon: Rogene Houston, MD;  Location[facility];  Service: Endoscopy;  Laterality: N/A;  100  . LITHOTRIPSY    . ORIF PERIPROSTHETIC FRACTURE Right 10/29/2014   Procedure: OPEN REDUCTION INTERNAL FIXATION (ORIF)  PERIPROSTHETIC FEMUR FRACTURE/FEMUR SHAFT;  Surgeon: Mcarthur Rossetti, MD;  Location: [facility];  Service: Orthopedics;  Laterality: Right;  . TONSILLECTOMY    . TOTAL HIP ARTHROPLASTY     1989    OB History    Gravida Para Term Preterm AB Living             4   SAB TAB Ectopic Multiple Live Births                   Home Medications    Prior to Admission medications   Medication Sig Start Date End Date Taking? Authorizing Provider  aspirin EC 81 MG tablet Take 81 mg by mouth daily.   Yes Historical Provider, MD  clonazePAM (KLONOPIN) 0.5 MG tablet Take 0.125 mg by mouth daily as needed (FOR BP LEVELS).   Yes Historical Provider, MD  furosemide (LASIX) 40 MG tablet Take 0.5 tablets (20 mg total) by mouth daily. Patient taking differently: Take 20 mg by mouth 3 (three) times a week.  10/31/14  Yes Shanker Kristeen Mans, MD  HYDROcodone-acetaminophen (NORCO/VICODIN) 5-325 MG tablet Take 0.5-1 tablets by mouth daily as needed for moderate pain.   Yes Historical Provider, MD  ipratropium-albuterol (DUONEB) 0.5-2.5 (3) MG/3ML SOLN Take 3 mLs by nebulization every 4 (four) hours as needed. 10/31/14  Yes Shanker Kristeen Mans, MD  polyethylene glycol (MIRALAX / GLYCOLAX) packet Take 17 g  by mouth 2 (two) times daily. Patient taking differently: Take 17 g by mouth daily as needed for mild constipation.  10/31/14  Yes Shanker Kristeen Mans, MD  potassium chloride SA (K-DUR,KLOR-CON) 20 MEQ tablet Take 20 mEq by mouth 3 (three) times a week. Takes only when Furosemide is taken   Yes Historical Provider, MD  PROAIR HFA [phone] Base) MCG/ACT inhaler Inhale 1-2 puffs into the lungs every 6 (six) hours as needed for shortness of breath.  08/24/16  Yes Historical Provider, MD    Family History History reviewed. No pertinent family history.  Social History Social History  Substance Use Topics  . Smoking status: Never Smoker  . Smokeless tobacco: Never Used  . Alcohol use No     Allergies     Erythromycin; Levaquin [levofloxacin]; Penicillins; and Sulfa antibiotics   Review of Systems Review of Systems  ROS 10 Systems reviewed and are negative for acute change except as noted in the HPI.     Physical Exam Updated Vital Signs BP (!) 147/67   Pulse 92   Temp 98.5 F (36.9 C) (Axillary)   Resp (!) 28   Ht 4\' 7"  (1.397 m)   Wt 107 lb 2.3 oz (48.6 kg)   SpO2 94%   BMI 24.90 kg/m   Physical Exam  Constitutional: She is oriented to person, place, and time. She appears well-developed and well-nourished.  HENT:  Head: Normocephalic and atraumatic.  Eyes: EOM are normal. Pupils are equal, round, and reactive to light.  Neck: Neck supple.  Cardiovascular: Regular rhythm and normal heart sounds.   No murmur heard. tachycardia  Pulmonary/Chest: Effort normal. No respiratory distress. She has wheezes. She has no rales.  Abdominal: Soft. She exhibits no distension. There is no tenderness. There is no rebound and no guarding.  Neurological: She is alert and oriented to person, place, and time.  Skin: Skin is warm and dry.  Nursing note and vitals reviewed.    ED Treatments / Results  Labs (all labs ordered are listed, but only abnormal results are displayed) Labs Reviewed  CBC WITH DIFFERENTIAL/PLATELET - Abnormal; Notable for the following:       Result Value   WBC 19.7 (*)    Neutro Abs 17.1 (*)    Monocytes Absolute 1.5 (*)    All other components within normal limits  COMPREHENSIVE METABOLIC PANEL - Abnormal; Notable for the following:    Potassium 3.3 (*)    Chloride 99 (*)    Albumin 3.2 (*)    ALT 11 (*)    All other components within normal limits  TROPONIN I - Abnormal; Notable for the following:    Troponin I 0.04 (*)    All other components within normal limits  URINALYSIS, ROUTINE W REFLEX MICROSCOPIC (NOT AT [facility]) - Abnormal; Notable for the following:    Hgb urine dipstick LARGE (*)    Bilirubin Urine SMALL (*)    Ketones, ur >80 (*)     Protein, ur 100 (*)    Leukocytes, UA TRACE (*)    All other components within normal limits  URINE MICROSCOPIC-ADD ON - Abnormal; Notable for the following:    Squamous Epithelial / LPF 0-5 (*)    Bacteria, UA RARE (*)    All other components within normal limits  BRAIN NATRIURETIC PEPTIDE - Abnormal; Notable for the following:    B Natriuretic Peptide 459.0 (*)    All other components within normal limits  TROPONIN I - Abnormal; Notable  for the following:    Troponin I 0.04 (*)    All other components within normal limits  TROPONIN I - Abnormal; Notable for the following:    Troponin I 0.05 (*)    All other components within normal limits  TROPONIN I - Abnormal; Notable for the following:    Troponin I 0.07 (*)    All other components within normal limits  TROPONIN I - Abnormal; Notable for the following:    Troponin I 0.06 (*)    All other components within normal limits  BASIC METABOLIC PANEL - Abnormal; Notable for the following:    Glucose, Bld 121 (*)    Calcium 8.6 (*)    All other components within normal limits  CBC WITH DIFFERENTIAL/PLATELET - Abnormal; Notable for the following:    WBC 21.7 (*)    Hemoglobin 10.7 (*)    HCT 34.0 (*)    Neutro Abs 18.9 (*)    Monocytes Absolute 2.0 (*)    All other components within normal limits  BLOOD GAS, ARTERIAL - Abnormal; Notable for the following:    pO2, Arterial 77.9 (*)    All other components within normal limits  CULTURE, BLOOD (ROUTINE X 2)  CULTURE, BLOOD (ROUTINE X 2)  MRSA PCR SCREENING  URINE CULTURE  CULTURE, EXPECTORATED SPUTUM-ASSESSMENT  GRAM STAIN  INFLUENZA PANEL BY PCR (TYPE A & B, H1N1)  STREP PNEUMONIAE URINARY ANTIGEN  CBC  COMPREHENSIVE METABOLIC PANEL  I-STAT CG4 LACTIC ACID, ED    EKG  EKG Interpretation  Date/Time:  Monday October 04 2016 15:30:49 EST Ventricular Rate:  110 PR Interval:  140 QRS Duration: 84 QT Interval:  338 QTC Calculation: 457 R Axis:   92 Text Interpretation:   Sinus tachycardia Rightward axis Left ventricular hypertrophy with repolarization abnormality Abnormal ECG No acute changes Nonspecific ST and T wave abnormality Confirmed by Kathrynn Humble, MD, Thelma Comp [phone]) on 10/04/2016 7:33:39 PM       Radiology Dg Chest 2 View  Result Date: 10/04/2016 CLINICAL DATA:  Shortness of breath . EXAM: CHEST  2 VIEW COMPARISON:  10/28/2014. FINDINGS: Mediastinum hilar structures normal. Cardiomegaly with mild bibasilar infiltrates small pleural effusions suggesting congestive heart failure. Low lung volumes. No pneumothorax. Thoracic spine kyphosis and diffuse osteopenia. IMPRESSION: 1. Cardiomegaly. Mild mild basilar infiltrates and/or edema with small pleural effusions. Congestive heart failure cannot be excluded. 2. Low lung volumes. Electronically Signed   By: Marcello Moores  Register   On: 10/04/2016 16:00    Procedures Procedures (including critical care time)  Medications Ordered in ED Medications  aspirin EC tablet 81 mg (81 mg Oral Given 10/05/16 0957)  HYDROcodone-acetaminophen (NORCO/VICODIN) 5-325 MG per tablet 0.5-1 tablet (not administered)  potassium chloride SA (K-DUR,KLOR-CON) CR tablet 20 mEq (not administered)  polyethylene glycol (MIRALAX / GLYCOLAX) packet 17 g (not administered)  enoxaparin (LOVENOX) injection 40 mg (40 mg Subcutaneous Given 10/05/16 0016)  lactated ringers infusion ( Intravenous New Bag/Given 10/04/16 2357)  azithromycin (ZITHROMAX) 500 mg in dextrose 5 % 250 mL IVPB (500 mg Intravenous Given 10/05/16 0008)  ipratropium-albuterol (DUONEB) 0.5-2.5 (3) MG/3ML nebulizer solution 3 mL (3 mLs Nebulization Given 10/05/16 1425)  albuterol (PROVENTIL) (2.5 MG/3ML) 0.083% nebulizer solution 2.5 mg (2.5 mg Nebulization Given 10/05/16 0123)  vancomycin (VANCOCIN) IVPB 1000 mg/200 mL premix (1,000 mg Intravenous Given 10/05/16 0012)    Followed by  vancomycin (VANCOCIN) IVPB 750 mg/150 ml premix (not administered)  clonazePAM (KLONOPIN) tablet 0.25 mg  (not administered)  methylPREDNISolone sodium succinate (SOLU-MEDROL) 40  mg/mL injection 40 mg (40 mg Intravenous Given 10/05/16 0956)  guaiFENesin (MUCINEX) 12 hr tablet 1,200 mg (1,200 mg Oral Given 10/05/16 0957)  cefTRIAXone (ROCEPHIN) 1 g in dextrose 5 % 50 mL IVPB (not administered)  MEDLINE mouth rinse (15 mLs Mouth Rinse Given 10/05/16 1309)  feeding supplement (ENSURE ENLIVE) (ENSURE ENLIVE) liquid 237 mL (237 mLs Oral Not Given 10/05/16 1633)  sodium chloride 0.9 % bolus 500 mL (1,000 mLs Intravenous New Bag/Given 10/04/16 2025)  cefTRIAXone (ROCEPHIN) 1 g in dextrose 5 % 50 mL IVPB (0 g Intravenous Stopped 10/04/16 2040)  potassium chloride SA (K-DUR,KLOR-CON) CR tablet 40 mEq (40 mEq Oral Given 10/05/16 0016)     Initial Impression / Assessment and Plan / ED Course  I have reviewed the triage vital signs and the nursing notes.  Pertinent labs & imaging results that were available during my care of the patient were reviewed by me and considered in my medical decision making (see chart for details).  Clinical Course     Pt appears to have a CAP - cough/elevated WC/CXR with consolidation/poor lung sounds. Pretest probability for PE is low - but it is something that can be entertained if patient gets worse. CAP tx started. Admit.  Final Clinical Impressions(s) / ED Diagnoses   Final diagnoses:  Acute respiratory distress  Sinus tachycardia  Community acquired pneumonia, unspecified laterality    New Prescriptions Current Discharge Medication List       Varney Biles, MD 10/05/16 [phone]

## 2016-10-04 NOTE — ED Notes (Signed)
CRITICAL VALUE ALERT  Critical value received:  Troponin 0.04  Date of notification:  10/04/2016  Time of notification:  [id number]  Critical value read back:Yes.    Nurse who received alert:  [facility] RN  MD notified (1st page):  Dr. Rogene Houston  Time of first page:  [phone]  MD notified (2nd page):  Time of second page:  Responding MD:  Dr. Rogene Houston  Time MD responded:  [phone]

## 2016-10-04 NOTE — ED Triage Notes (Signed)
PT states SOB on exertion and increased nasal drainage and cough at night x2 weeks. PT also states cold chills, head ache, body aches, nausea, decreased appetite and no relief from home neb tx.

## 2016-10-05 DIAGNOSIS — J42 Unspecified chronic bronchitis: Secondary | ICD-10-CM

## 2016-10-05 DIAGNOSIS — I509 Heart failure, unspecified: Secondary | ICD-10-CM

## 2016-10-05 LAB — CBC WITH DIFFERENTIAL/PLATELET
Basophils Absolute: 0 10*3/uL (ref 0.0–0.1)
Basophils Relative: 0 %
EOS PCT: 0 %
Eosinophils Absolute: 0 10*3/uL (ref 0.0–0.7)
HEMATOCRIT: 34 % — AB (ref 36.0–46.0)
HEMOGLOBIN: 10.7 g/dL — AB (ref 12.0–15.0)
LYMPHS ABS: 0.7 10*3/uL (ref 0.7–4.0)
LYMPHS PCT: 3 %
MCH: 27.6 pg (ref 26.0–34.0)
MCHC: 31.5 g/dL (ref 30.0–36.0)
MCV: 87.9 fL (ref 78.0–100.0)
Monocytes Absolute: 2 10*3/uL — ABNORMAL HIGH (ref 0.1–1.0)
Monocytes Relative: 9 %
NEUTROS ABS: 18.9 10*3/uL — AB (ref 1.7–7.7)
NEUTROS PCT: 88 %
Platelets: 326 10*3/uL (ref 150–400)
RBC: 3.87 MIL/uL (ref 3.87–5.11)
RDW: 14.6 % (ref 11.5–15.5)
WBC: 21.7 10*3/uL — AB (ref 4.0–10.5)

## 2016-10-05 LAB — BASIC METABOLIC PANEL
Anion gap: 5 (ref 5–15)
BUN: 9 mg/dL (ref 6–20)
CHLORIDE: 104 mmol/L (ref 101–111)
CO2: 26 mmol/L (ref 22–32)
Calcium: 8.6 mg/dL — ABNORMAL LOW (ref 8.9–10.3)
Creatinine, Ser: 0.49 mg/dL (ref 0.44–1.00)
GFR calc Af Amer: >60 mL/min (ref >60)
GFR calc non Af Amer: >60 mL/min (ref >60)
Glucose, Bld: 121 mg/dL — ABNORMAL HIGH (ref 65–99)
POTASSIUM: 3.5 mmol/L (ref 3.5–5.1)
SODIUM: 135 mmol/L (ref 135–145)

## 2016-10-05 LAB — MRSA PCR SCREENING: MRSA by PCR: NEGATIVE

## 2016-10-05 LAB — STREP PNEUMONIAE URINARY ANTIGEN: Strep Pneumo Urinary Antigen: NEGATIVE

## 2016-10-05 LAB — EXPECTORATED SPUTUM ASSESSMENT W REFEX TO RESP CULTURE: SPECIAL REQUESTS: NORMAL

## 2016-10-05 LAB — TROPONIN I
TROPONIN I: 0.06 ng/mL — AB (ref <0.03)
Troponin I: 0.05 ng/mL (ref <0.03)
Troponin I: 0.07 ng/mL (ref <0.03)

## 2016-10-05 LAB — EXPECTORATED SPUTUM ASSESSMENT W GRAM STAIN, RFLX TO RESP C

## 2016-10-05 MED ORDER — GUAIFENESIN ER 600 MG PO TB12
1200.0000 mg | ORAL_TABLET | Freq: Two times a day (BID) | ORAL | Status: DC
  Administered 2016-10-05 – 2016-10-11 (×13): 1200 mg via ORAL
  Filled 2016-10-05 (×13): qty 2

## 2016-10-05 MED ORDER — METHYLPREDNISOLONE SODIUM SUCC 40 MG IJ SOLR
40.0000 mg | Freq: Two times a day (BID) | INTRAMUSCULAR | Status: DC
  Administered 2016-10-05 – 2016-10-07 (×6): 40 mg via INTRAVENOUS
  Filled 2016-10-05 (×7): qty 1

## 2016-10-05 MED ORDER — ENSURE ENLIVE PO LIQD
237.0000 mL | Freq: Two times a day (BID) | ORAL | Status: DC
  Administered 2016-10-06 – 2016-10-11 (×9): 237 mL via ORAL

## 2016-10-05 MED ORDER — IPRATROPIUM-ALBUTEROL 0.5-2.5 (3) MG/3ML IN SOLN
3.0000 mL | Freq: Three times a day (TID) | RESPIRATORY_TRACT | Status: DC
  Administered 2016-10-06 – 2016-10-08 (×6): 3 mL via RESPIRATORY_TRACT
  Filled 2016-10-05 (×6): qty 3

## 2016-10-05 MED ORDER — ORAL CARE MOUTH RINSE
15.0000 mL | Freq: Two times a day (BID) | OROMUCOSAL | Status: DC
  Administered 2016-10-05 – 2016-10-11 (×11): 15 mL via OROMUCOSAL

## 2016-10-05 MED ORDER — DEXTROSE 5 % IV SOLN
1.0000 g | INTRAVENOUS | Status: DC
  Administered 2016-10-05 – 2016-10-07 (×3): 1 g via INTRAVENOUS
  Administered 2016-10-08: 21:00:00 via INTRAVENOUS
  Administered 2016-10-09 – 2016-10-10 (×2): 1 g via INTRAVENOUS
  Filled 2016-10-05 (×7): qty 10

## 2016-10-05 MED ORDER — CLONAZEPAM 0.5 MG PO TABS: 0.1250 mg | ORAL_TABLET | Freq: Every day | ORAL | Status: DC | PRN

## 2016-10-05 NOTE — Care Management Note (Signed)
Case Management Note  Patient Details  Name: Hannah Mckee MRN: [id number] Date of Birth: [birth year]  Subjective/Objective:                  Pt is from home, lives alone, has daughter for support who is at the bedside. Pt is ind with ADL's. Uses walker with ambulation. She has PCP and gets her daughter to drive her to appointments. She does not have oxygen at home PTA but does have neb machine.   Action/Plan: She plans to return home with self care at DC.  CM will cont to follow.  Pt will need PT eval. Pt may benefit from [facility] depending on hospital progression.  Expected Discharge Date:      10/08/2016            Expected Discharge Plan:  Home/Self Care  In-House Referral:  NA  Discharge planning Services  CM Consult  Post Acute Care Choice:  NA Choice offered to:  NA  Status of Service:  In process, will continue to follow  Sherald Barge, RN 10/05/2016, 3:14 PM

## 2016-10-05 NOTE — Progress Notes (Signed)
Pharmacy Antibiotic Note  Hannah Mckee is a 82 y.o. female presented to ED on 10/04/2016 with sepsis/CAP.  Pharmacy has been consulted for Vancomycin and ceftriaxone dosing  Plan: Vancomycin 1gm IV x 1 Vancomycin 750 mg IV every 24 hours.  Goal trough 15-20 mcg/mL.  Ceftriaxone 1gm IV q24h Monitor labs, micro and vitals Vancomycin trough levels as indicated.   Height: 4\' 7"  (139.7 cm) Weight: 107 lb 2.3 oz (48.6 kg) IBW/kg (Calculated) : 34  Temp (24hrs), Avg:98.3 F (36.8 C), Min:97.9 F (36.6 C), Max:99.3 F (37.4 C)   Recent Labs Lab 10/04/16 1725 10/04/16 2104 10/05/16 0458  WBC 19.7*  --  21.7*  CREATININE 0.69  --  0.49  LATICACIDVEN  --  0.97  --     Estimated Creatinine Clearance: 33.5 mL/min (by C-G formula based on SCr of 0.49 mg/dL).    Allergies  Allergen Reactions  . Erythromycin Swelling  . Levaquin [Levofloxacin] Other (See Comments)    Tongue turns red and sore  . Penicillins Swelling    Has patient had a PCN reaction causing immediate rash, facial/tongue/throat swelling, SOB or lightheadedness with hypotension: Yes Has patient had a PCN reaction causing severe rash involving mucus membranes or skin necrosis: No Has patient had a PCN reaction that required hospitalization No Has patient had a PCN reaction occurring within the last 10 years: No If all of the above answers are "NO", then may proceed with Cephalosporin use.   . Sulfa Antibiotics Other (See Comments)    Tongue turns red and sore    Antimicrobials this admission: Vancomycin  12/4 >>  Doxy 12/4 >> 12/4 Ceftriaxone 12/4 >>  Dose adjustments this admission: n/a   Microbiology results: 12/5 Influenzae: negative 12/4 Blood cx:  Pending 12/4 MRSA PCR; negative  Thank you for allowing pharmacy to be a part of this patient's care.  Isac Sarna, BS Pharm D, [place] Clinical Pharmacist Pager [phone] 10/05/2016 9:27 AM

## 2016-10-05 NOTE — Progress Notes (Addendum)
Pharmacy Antibiotic Note  Hannah Mckee is a 82 y.o. female presented to ED on 10/04/2016 with sepsis/CAP.  Pharmacy has been consulted for Vancomycin and ceftriaxone dosing Will check Vancomycin trough. Plan: Vancomycin 1gm IV x 1 Vancomycin 750 mg IV every 24 hours.  Goal trough 15-20 mcg/mL.  Ceftriaxone 1gm IV q24h Monitor labs, micro and vitals Vancomycin trough levels as indicated.   Height: 4\' 7"  (139.7 cm) Weight: 107 lb 2.3 oz (48.6 kg) IBW/kg (Calculated) : 34  Temp (24hrs), Avg:98.3 F (36.8 C), Min:97.9 F (36.6 C), Max:99.3 F (37.4 C)   Recent Labs Lab 10/04/16 1725 10/04/16 2104 10/05/16 0458  WBC 19.7*  --  21.7*  CREATININE 0.69  --  0.49  LATICACIDVEN  --  0.97  --     Estimated Creatinine Clearance: 33.5 mL/min (by C-G formula based on SCr of 0.49 mg/dL).    Allergies  Allergen Reactions  . Erythromycin Swelling  . Levaquin [Levofloxacin] Other (See Comments)    Tongue turns red and sore  . Penicillins Swelling    Has patient had a PCN reaction causing immediate rash, facial/tongue/throat swelling, SOB or lightheadedness with hypotension: Yes Has patient had a PCN reaction causing severe rash involving mucus membranes or skin necrosis: No Has patient had a PCN reaction that required hospitalization No Has patient had a PCN reaction occurring within the last 10 years: No If all of the above answers are "NO", then may proceed with Cephalosporin use.   . Sulfa Antibiotics Other (See Comments)    Tongue turns red and sore    Antimicrobials this admission: Vancomycin  12/4 >>  Doxy 12/4 >> 12/4 Ceftriaxone 12/4 >> Azithromycin 12/5 >>  Dose adjustments this admission: n/a   Microbiology results: 12/5 Influenzae: negative 12/4 Blood cx:  Pending 12/4 MRSA PCR; negative  Thank you for allowing pharmacy to be a part of this patient's care.  Isac Sarna, BS Pharm D, [place] Clinical Pharmacist Pager [phone] 10/05/2016 9:39 AM

## 2016-10-05 NOTE — Progress Notes (Signed)
Initial Nutrition Assessment  DOCUMENTATION CODES:  Non-severe (moderate) malnutrition in context of chronic illness   Pt meets criteria for MODERATE MALNUTRITION in the context of Chronic Illness as evidenced by moderate muscle/fat loss and an oral intake that is estimated to have met < or equal to 75% of needs for > or equal to 1 mont.   INTERVENTION:   Pt reports hx of swallowing difficulty. Reportedly had FEES early 2016 to evaluate. Reports getting choked on salads and occasionally has coughing after meals. Would recommend SLP eval if at all concerned about Aspiration Origin.   Pt directly cites Dyspnea during po intake as reason for poor appetite.  As such, Home 02 may benefit PO intake//nutrition status as well.   Ensure Enlive po BID, each supplement provides 350 kcal and 20 grams of protein  Kozy shack pudding with meals-has some trouble chewing due to missing bottom teeth  NUTRITION DIAGNOSIS:  Inadequate oral intake related to Chronic illness (COPD) and associated Repiratory deficiency as evidenced by moderate muscle/fat loss and an oral intake that is estimated to have met < or equal to 75% of needs for > or equal to 1 month.  GOAL:  Patient will meet greater than or equal to 90% of their needs  MONITOR:  PO intake, Supplement acceptance, Labs, I & O's  REASON FOR ASSESSMENT:  Consult Assessment of nutrition requirement/status  ASSESSMENT:  82 y/o female PMhx Femur fx, COPD (no 02), HTN, CHF. Presents with flu-like symptoms w/ coughing up bloody mucus and very severe SOB. Worked up for PNA and meets sepsis criteria.Admitted for management.   Pt reports that at baseline her appetite is poor. She says this is directly related to her dyspnea. She says she cannot breathe as well when she eats. It has been especially worse recently. Sometimes her only intake will be a piece of toast in the morning and then only half a "half can of soup" for dinner.  However, in contrast, other  days she "will gorge myself". She did not take any vitamins/minerals or drink any nutritional supplements.    Denies N/V. Has occasional constipation which she takes miralax for.   RD asked about coughing after meals. She says she had that today. She does endorse trouble swallowing that she has "had forever". She says she gets choked on Salads and Spinach, but nothing else. She describes the feeling as "a tire spinning on ice". It feels like the food is rolling around but not going anywhere.    RD mentioned she may benefit from a ST eval and surprisingly she stated "they arent gonna put that thing up my nose and watch me swallow again?". Sounds that she was reporting a FEES procedure which she says she had early 2016. At that time she was told it was posture related: "Because I lean forward". She was told there was nothing she could do to fix it and she just need to take smaller bites  She does not know her new UBW. She says that she broke her leg a couple years back. Prior to that incident her UBW was 125 lbs. She underwent rehab and lost to 105 lbs. She reports she has never been able to gain it back. Her weight appears to be stable for atleast the last 6 months.   She was agreeable to Ensure while admitted. SHe enjoys these. She has trouble chewing due to many missing bottom teeth. Agreeable to pudding on trays.   NFPE: Moderate Orbital, thoracic fat wasting.  Mild-Moderate Temporal wasting. Severe interosseous wasting.    Medications: IV abx, Methylprednisolone, kcl,  Labs: WBC:21.7, H/H:10.7/34   Recent Labs Lab 10/04/16 1725 10/05/16 0458  NA 135 135  K 3.3* 3.5  CL 99* 104  CO2 26 26  BUN 11 9  CREATININE 0.69 0.49  CALCIUM 9.3 8.6*  GLUCOSE 97 121*   Diet Order:  Diet Heart Room service appropriate? Yes; Fluid consistency: Thin  Skin: MSAD to buttocks,  Last BM:  12/5  Height:  Ht Readings from Last 1 Encounters:  10/04/16 _0  (1.397 m)   Weight:  Wt Readings from  Last 1 Encounters:  10/05/16 107 lb 2.3 oz (48.6 kg)   Wt Readings from Last 10 Encounters:  10/05/16 107 lb 2.3 oz (48.6 kg)  04/23/16 106 lb 6.4 oz (48.3 kg)  10/29/14 154 lb 12.2 oz (70.2 kg)  10/16/13 117 lb 3.2 oz (53.2 kg)  04/16/13 123 lb (55.8 kg)  11/06/12 119 lb 6.4 oz (54.2 kg)   Ideal Body Weight:  41.66 kg  BMI:  Body mass index is 24.9 kg/m.  Estimated Nutritional Needs:  Kcal:  1450-1650 kcals (30-34 g/kg bw) Protein:  68-78 g (1.4-1.6 g/kg bw) Fluid:  1.2 L (25 ml/kg bw)  EDUCATION NEEDS:  No education needs identified at this time  Burtis Junes RD, LDN, [place] Nutrition Pager: [phone] 10/05/2016 2:28 PM

## 2016-10-05 NOTE — Consult Note (Signed)
Consult requested by:Dr. [place] Consult requested SD:[id number] respiratory failure/CAP/COPDE  HPI: This is an 82 year old with a long known history of COPD and heart failure. She uses a nebulizer at home but is not on home oxygen. She has a history of congestive heart failure. She says she took a flu shot in November and got sick after that. She has been sick since with cough congestion flulike symptoms. She's not sure she's had fever. She's not been able to lie flat. She has had chills. She felt like she was going to smother to death. She's coughing up discolored sputum. She has some chest discomfort associated with all this but it seems more pleuritic  Past Medical History:  Diagnosis Date  . CHF (congestive heart failure) ([place])   . COPD (chronic obstructive pulmonary disease) ([facility])    no home O2  . Hypertension   . Kidney stones   . Osteoarthritis   . Sepsis ([place])      History reviewed. No pertinent family history.   Social History   Social History  . Marital status: Divorced    Spouse name: N/A  . Number of children: N/A  . Years of education: N/A   Social History Main Topics  . Smoking status: Never Smoker  . Smokeless tobacco: Never Used  . Alcohol use No  . Drug use: No  . Sexual activity: Not Asked   Other Topics Concern  . None   Social History Narrative  . None     ROS: Constitutional: She has lost some weight. Eyes: No blurred vision ears nose mouth and throat: She's not had any trouble swallowing. She has had some sinus congestion cardiovascular: She's had some chest pain but it seems pleuritic as noted. Respiratory: See history of present illness. Gastrointestinal: No abdominal pain nausea or vomiting although she did get sick at her stomach when she was coughing up sputum. Genitourinary: No dysuria musculoskeletal: She has a previous fracture of her right leg and has some pain in that leg. Neurological: No headaches seizures    Objective: Vital signs in last 24  hours: Temp:  [97.9 F (36.6 C)-99.3 F (37.4 C)] 98 F (36.7 C) (12/05 0715) Pulse Rate:  [96-114] 96 (12/05 0715) Resp:  [17-36] 23 (12/05 0715) BP: (127-153)/(50-67) 151/58 (12/04 2230) SpO2:  [95 %-100 %] 95 % (12/05 0800) Weight:  [48.5 kg (107 lb)-48.6 kg (107 lb 2.3 oz)] 48.6 kg (107 lb 2.3 oz) (12/05 0500) Weight change:  Last BM Date: 10/05/16  Intake/Output from previous day: No intake/output data recorded.  PHYSICAL EXAM Constitutional: She is a thin female in minimal distress. She is wearing nasal oxygen. Eyes: Pupils are reactive. EOMI. Ears nose mouth and throat: Her mucous membranes are slightly dry. Hearing is normal. Cardiovascular: Her heart is regular with no gallop. She has no edema. Respiratory: Her respiratory effort is slightly increased. She has rhonchi bilaterally. Gastrointestinal: Her abdomen is soft with no masses bowel sounds are present. Musculoskeletal: No swollen joints. Neurologic: No focal abnormalities  Lab Results: Basic Metabolic Panel:  Recent Labs  10/04/16 1725 10/05/16 0458  NA 135 135  K 3.3* 3.5  CL 99* 104  CO2 26 26  GLUCOSE 97 121*  BUN 11 9  CREATININE 0.69 0.49  CALCIUM 9.3 8.6*   Liver Function Tests:  Recent Labs  10/04/16 1725  AST 17  ALT 11*  ALKPHOS 126  BILITOT 0.8  PROT 7.6  ALBUMIN 3.2*   No results for input(s): LIPASE, AMYLASE in the  last 72 hours. No results for input(s): AMMONIA in the last 72 hours. CBC:  Recent Labs  10/04/16 1725 10/05/16 0458  WBC 19.7* 21.7*  NEUTROABS 17.1* 18.9*  HGB 12.2 10.7*  HCT 38.0 34.0*  MCV 88.2 87.9  PLT 368 326   Cardiac Enzymes:  Recent Labs  10/04/16 2040 10/04/16 2328 10/05/16 0458  TROPONINI 0.04* 0.05* 0.07*   BNP: No results for input(s): PROBNP in the last 72 hours. D-Dimer: No results for input(s): DDIMER in the last 72 hours. CBG: No results for input(s): GLUCAP in the last 72 hours. Hemoglobin A1C: No results for input(s): HGBA1C in the  last 72 hours. Fasting Lipid Panel: No results for input(s): CHOL, HDL, LDLCALC, TRIG, CHOLHDL, LDLDIRECT in the last 72 hours. Thyroid Function Tests: No results for input(s): TSH, T4TOTAL, FREET4, T3FREE, THYROIDAB in the last 72 hours. Anemia Panel: No results for input(s): VITAMINB12, FOLATE, FERRITIN, TIBC, IRON, RETICCTPCT in the last 72 hours. Coagulation: No results for input(s): LABPROT, INR in the last 72 hours. Urine Drug Screen: Drugs of Abuse  No results found for: LABOPIA, COCAINSCRNUR, LABBENZ, AMPHETMU, THCU, LABBARB  Alcohol Level: No results for input(s): ETH in the last 72 hours. Urinalysis:  Recent Labs  10/04/16 1928  COLORURINE YELLOW  LABSPEC 1.020  PHURINE 6.0  GLUCOSEU NEGATIVE  HGBUR LARGE*  BILIRUBINUR SMALL*  KETONESUR >80*  PROTEINUR 100*  NITRITE NEGATIVE  LEUKOCYTESUR TRACE*   Misc. Labs:   ABGS:  Recent Labs  10/04/16 2322  PHART 7.374  PO2ART 77.9*  HCO3 22.8     MICROBIOLOGY: Recent Results (from the past 240 hour(s))  Blood Culture (routine x 2)     Status: None (Preliminary result)   Collection Time: 10/04/16  8:40 PM  Result Value Ref Range Status   Specimen Description LEFT ANTECUBITAL  Final   Special Requests BOTTLES DRAWN AEROBIC AND ANAEROBIC 6CC  Final   Culture PENDING  Incomplete   Report Status PENDING  Incomplete  Blood Culture (routine x 2)     Status: None (Preliminary result)   Collection Time: 10/04/16  8:44 PM  Result Value Ref Range Status   Specimen Description BLOOD LEFT HAND  Final   Special Requests BOTTLES DRAWN AEROBIC AND ANAEROBIC 6CC  Final   Culture PENDING  Incomplete   Report Status PENDING  Incomplete  MRSA PCR Screening     Status: None   Collection Time: 10/04/16 11:20 PM  Result Value Ref Range Status   MRSA by PCR NEGATIVE NEGATIVE Final    Comment:        The GeneXpert MRSA Assay (FDA approved for NASAL specimens only), is one component of a comprehensive MRSA  colonization surveillance program. It is not intended to diagnose MRSA infection nor to guide or monitor treatment for MRSA infections.     Studies/Results: Dg Chest 2 View  Result Date: 10/04/2016 CLINICAL DATA:  Shortness of breath . EXAM: CHEST  2 VIEW COMPARISON:  10/28/2014. FINDINGS: Mediastinum hilar structures normal. Cardiomegaly with mild bibasilar infiltrates small pleural effusions suggesting congestive heart failure. Low lung volumes. No pneumothorax. Thoracic spine kyphosis and diffuse osteopenia. IMPRESSION: 1. Cardiomegaly. Mild mild basilar infiltrates and/or edema with small pleural effusions. Congestive heart failure cannot be excluded. 2. Low lung volumes. Electronically Signed   By: Marcello Moores  Register   On: 10/04/2016 16:00    Medications:  Prior to Admission:  Prescriptions Prior to Admission  Medication Sig Dispense Refill Last Dose  . aspirin EC 81 MG tablet  Take 81 mg by mouth daily.   Past Week at Unknown time  . clonazePAM (KLONOPIN) 0.5 MG tablet Take 0.125 mg by mouth daily as needed (FOR BP LEVELS).   10/03/2016 at Unknown time  . furosemide (LASIX) 40 MG tablet Take 0.5 tablets (20 mg total) by mouth daily. (Patient taking differently: Take 20 mg by mouth 3 (three) times a week. ) 30 tablet  Past Week at Unknown time  . HYDROcodone-acetaminophen (NORCO/VICODIN) 5-325 MG tablet Take 0.5-1 tablets by mouth daily as needed for moderate pain.   Past Week at Unknown time  . ipratropium-albuterol (DUONEB) 0.5-2.5 (3) MG/3ML SOLN Take 3 mLs by nebulization every 4 (four) hours as needed. 360 mL  10/04/2016 at Unknown time  . polyethylene glycol (MIRALAX / GLYCOLAX) packet Take 17 g by mouth 2 (two) times daily. (Patient taking differently: Take 17 g by mouth daily as needed for mild constipation. ) 14 each 0 10/04/2016 at Unknown time  . potassium chloride SA (K-DUR,KLOR-CON) 20 MEQ tablet Take 20 mEq by mouth 3 (three) times a week. Takes only when Furosemide is taken    Past Week at Unknown time  . PROAIR HFA 108 (90 Base) MCG/ACT inhaler Inhale 1-2 puffs into the lungs every 6 (six) hours as needed for shortness of breath.    unknown   Scheduled: . aspirin EC  81 mg Oral Daily  . azithromycin  500 mg Intravenous Q24H  . enoxaparin (LOVENOX) injection  40 mg Subcutaneous Q24H  . guaiFENesin  1,200 mg Oral BID  . ipratropium-albuterol  3 mL Nebulization Q6H  . methylPREDNISolone (SOLU-MEDROL) injection  40 mg Intravenous Q12H  . [START ON 10/06/2016] potassium chloride SA  20 mEq Oral Once per day on Mon Wed Fri  . vancomycin  750 mg Intravenous Q24H   Continuous: . lactated ringers 75 mL/hr at 10/04/16 2357   [id number], clonazePAM, HYDROcodone-acetaminophen, polyethylene glycol  Assesment:She is admitted with acute hypoxic respiratory failure, COPD exacerbation sepsis and community-acquired pneumonia. She has improved substantially since last night when she was admitted. I've made some adjustment in her medications. She has elevated troponin likely from demand ischemia. She has not been on oxygen at home previously. Principal Problem:   Acute respiratory failure with hypoxia ([facility]) Active Problems:   CHF (congestive heart failure) ([facility])   COPD (chronic obstructive pulmonary disease) ([facility])   CAP (community acquired pneumonia)   Sepsis ([place])   Elevated troponin   Hypokalemia   Moderate malnutrition ([place])    Plan: Continue treatments. Add steroids. Add flutter valve. Add Mucinex.    LOS: 1 day   Grahm Etsitty L 10/05/2016, 8:58 AM

## 2016-10-05 NOTE — Progress Notes (Signed)
[facility]  PROGRESS NOTE  Hannah Mckee [id number] DOB: [birth year] DOA: 10/04/2016 PCP: Glenda Chroman, MD   Brief HPI:    82 y.o. female with medical history significant of femur fracture; HTN; COPD (not on home O2); CHF (no echo available) presenting with PNA.  Patient got sick 2 days after taking a flu shot (about 11/10).  Does lots of cooking, baking, making candy, daughter thought she had worn herself out.  The last week, more flu-like, has never felt so bad in her whole life.  Coughing up bloody mucus.  2 nights where she wasn't sure she was going to wake up in the morning.  Reports gasping for breath, "almost smothered to death."  Nebs 4x/day, prior used on rarely.  Nebs do loosen sputum.  Sputum is dark brown, burgundy, or gray.   Also with painful ?mass in side. The only way to make breathing better was to sit quietly alone.  Unable to lie flat.  Can't find thermometer - has not felt like she has fever but has had chills   Subjective   Patient today breathing much better. Denies shortness of breath.   Assessment/Plan:     1. Acute hypoxic respiratory failure - from Community acquired pneumonia and COPD exacerbation- patient started on vancomycin, azithromycin, Rocephin. Significant improvement this morning, patient feels much better. WBC still elevated 21,000. Follow blood cultures, sputum culture. Follow urine strep pneumo antigen 2. History of CHF- BNP elevated 459, 2-D echo ordered. Follow the results. Currently well compensated. 3. COPD exacerbation-improved, continue DuoNeb every 6 hours, Solu-Medrol 40 mg IV every 12 hours.     DVT prophylaxis: Lovenox  Code Status: Full code  Family Communication: Discussed with daughters at bedside   Disposition Plan: Home when medically stable   Consultants:  None  Procedures:  None  Continuous infusions . lactated ringers 75 mL/hr at 10/04/16 2357      Antibiotics:   Anti-infectives    Start      Dose/Rate Route Frequency Ordered Stop   10/05/16 2345  vancomycin (VANCOCIN) IVPB 750 mg/150 ml premix     750 mg 150 mL/hr over 60 Minutes Intravenous Every 24 hours 10/04/16 2334     10/05/16 2000  vancomycin (VANCOCIN) IVPB 750 mg/150 ml premix  Status:  Discontinued     750 mg 150 mL/hr over 60 Minutes Intravenous Every 24 hours 10/04/16 1943 10/04/16 2321   10/05/16 2000  cefTRIAXone (ROCEPHIN) 1 g in dextrose 5 % 50 mL IVPB     1 g 100 mL/hr over 30 Minutes Intravenous Every 24 hours 10/05/16 0933     10/05/16 0000  azithromycin (ZITHROMAX) 500 mg in dextrose 5 % 250 mL IVPB     500 mg 250 mL/hr over 60 Minutes Intravenous Every 24 hours 10/04/16 2321 10/11/16 2359   10/04/16 2345  vancomycin (VANCOCIN) IVPB 1000 mg/200 mL premix     1,000 mg 200 mL/hr over 60 Minutes Intravenous  Once 10/04/16 2334 10/05/16 0112   10/04/16 2200  ceFEPIme (MAXIPIME) 1 g in dextrose 5 % 50 mL IVPB  Status:  Discontinued     1 g 100 mL/hr over 30 Minutes Intravenous Every 12 hours 10/04/16 1928 10/04/16 1931   10/04/16 2000  doxycycline (VIBRAMYCIN) 100 mg in dextrose 5 % 250 mL IVPB  Status:  Discontinued     100 mg 125 mL/hr over 120 Minutes Intravenous Every 12 hours 10/04/16 1932 10/04/16 2321   10/04/16 2000  vancomycin (VANCOCIN) IVPB 1000  mg/200 mL premix  Status:  Discontinued     1,000 mg 200 mL/hr over 60 Minutes Intravenous  Once 10/04/16 1943 10/04/16 2321   10/04/16 1945  cefTRIAXone (ROCEPHIN) 1 g in dextrose 5 % 50 mL IVPB     1 g 100 mL/hr over 30 Minutes Intravenous  Once 10/04/16 1930 10/04/16 2040   10/04/16 1945  doxycycline (VIBRA-TABS) tablet 100 mg  Status:  Discontinued     100 mg Oral  Once 10/04/16 1930 10/04/16 1932   10/04/16 1930  vancomycin (VANCOCIN) IVPB 1000 mg/200 mL premix  Status:  Discontinued     1,000 mg 200 mL/hr over 60 Minutes Intravenous  Once 10/04/16 1928 10/04/16 1931       Objective   Vitals:   10/05/16 1425 10/05/16 1500 10/05/16 1600  10/05/16 1610  BP:   (!) 147/67   Pulse:  (!) 103 100 92  Resp:  (!) 31 (!) 29 (!) 28  Temp:    98.5 F (36.9 C)  TempSrc:    Axillary  SpO2: 98% 97% 97% 94%  Weight:      Height:        Intake/Output Summary (Last 24 hours) at 10/05/16 1649 Last data filed at 10/05/16 1600  Gross per 24 hour  Intake          1323.75 ml  Output                0 ml  Net          1323.75 ml   Filed Weights   10/04/16 1536 10/04/16 2330 10/05/16 0500  Weight: 48.5 kg (107 lb) 48.6 kg (107 lb 2.3 oz) 48.6 kg (107 lb 2.3 oz)     Physical Examination:  General exam: Appears calm and comfortable. Respiratory system: Clear to auscultation. Respiratory effort normal. Cardiovascular system:  RRR. No  murmurs, rubs, gallops. No pedal edema. GI system: Abdomen is nondistended, soft and nontender. No organomegaly.  Central nervous system. No focal neurological deficits. 5 x 5 power in all extremities. Skin: No rashes, lesions or ulcers. Psychiatry: Alert, oriented x 3.Judgement and insight appear normal. Affect normal.    Data Reviewed: I have personally reviewed following labs and imaging studies  CBG: No results for input(s): GLUCAP in the last 168 hours.  CBC:  Recent Labs Lab 10/04/16 1725 10/05/16 0458  WBC 19.7* 21.7*  NEUTROABS 17.1* 18.9*  HGB 12.2 10.7*  HCT 38.0 34.0*  MCV 88.2 87.9  PLT 368 [id number]    Basic Metabolic Panel:  Recent Labs Lab 10/04/16 1725 10/05/16 0458  NA 135 135  K 3.3* 3.5  CL 99* 104  CO2 26 26  GLUCOSE 97 121*  BUN 11 9  CREATININE 0.69 0.49  CALCIUM 9.3 8.6*    Recent Results (from the past 240 hour(s))  Blood Culture (routine x 2)     Status: None (Preliminary result)   Collection Time: 10/04/16  8:40 PM  Result Value Ref Range Status   Specimen Description LEFT ANTECUBITAL  Final   Special Requests BOTTLES DRAWN AEROBIC AND ANAEROBIC 6CC  Final   Culture PENDING  Incomplete   Report Status PENDING  Incomplete  Blood Culture (routine x  2)     Status: None (Preliminary result)   Collection Time: 10/04/16  8:44 PM  Result Value Ref Range Status   Specimen Description BLOOD LEFT HAND  Final   Special Requests BOTTLES DRAWN AEROBIC AND ANAEROBIC [facility]  Final   Culture PENDING  Incomplete   Report Status PENDING  Incomplete  MRSA PCR Screening     Status: None   Collection Time: 10/04/16 11:20 PM  Result Value Ref Range Status   MRSA by PCR NEGATIVE NEGATIVE Final    Comment:        The GeneXpert MRSA Assay (FDA approved for NASAL specimens only), is one component of a comprehensive MRSA colonization surveillance program. It is not intended to diagnose MRSA infection nor to guide or monitor treatment for MRSA infections.      Liver Function Tests:  Recent Labs Lab 10/04/16 1725  AST 17  ALT 11*  ALKPHOS 126  BILITOT 0.8  PROT 7.6  ALBUMIN 3.2*   No results for input(s): LIPASE, AMYLASE in the last 168 hours. No results for input(s): AMMONIA in the last 168 hours.  Cardiac Enzymes:  Recent Labs Lab 10/04/16 1725 10/04/16 2040 10/04/16 2328 10/05/16 0458 10/05/16 1034  TROPONINI 0.04* 0.04* 0.05* 0.07* 0.06*   BNP (last 3 results)  Recent Labs  10/04/16 2044  BNP 459.0*    ProBNP (last 3 results) No results for input(s): PROBNP in the last 8760 hours.    Studies: Dg Chest 2 View  Result Date: 10/04/2016 CLINICAL DATA:  Shortness of breath . EXAM: CHEST  2 VIEW COMPARISON:  10/28/2014. FINDINGS: Mediastinum hilar structures normal. Cardiomegaly with mild bibasilar infiltrates small pleural effusions suggesting congestive heart failure. Low lung volumes. No pneumothorax. Thoracic spine kyphosis and diffuse osteopenia. IMPRESSION: 1. Cardiomegaly. Mild mild basilar infiltrates and/or edema with small pleural effusions. Congestive heart failure cannot be excluded. 2. Low lung volumes. Electronically Signed   By: [place]   On: 10/04/2016 16:00    Scheduled Meds: . aspirin EC  81 mg  Oral Daily  . azithromycin  500 mg Intravenous Q24H  . cefTRIAXone (ROCEPHIN)  IV  1 g Intravenous Q24H  . enoxaparin (LOVENOX) injection  40 mg Subcutaneous Q24H  . feeding supplement (ENSURE ENLIVE)  237 mL Oral BID BM  . guaiFENesin  1,200 mg Oral BID  . ipratropium-albuterol  3 mL Nebulization Q6H  . mouth rinse  15 mL Mouth Rinse BID  . methylPREDNISolone (SOLU-MEDROL) injection  40 mg Intravenous Q12H  . [START ON 10/06/2016] potassium chloride SA  20 mEq Oral Once per day on Mon Wed Fri  . vancomycin  750 mg Intravenous Q24H      Time spent: 25 min  [facility] Pager [phone]. If 7PM-7AM, please contact night-coverage at [email or web address], Office  [phone]  password [email or web address] 10/05/2016, 4:49 PM  LOS: 1 day

## 2016-10-06 ENCOUNTER — Inpatient Hospital Stay (HOSPITAL_COMMUNITY): Payer: Medicare Other

## 2016-10-06 DIAGNOSIS — I427 Cardiomyopathy due to drug and external agent: Secondary | ICD-10-CM

## 2016-10-06 LAB — COMPREHENSIVE METABOLIC PANEL
ALK PHOS: 105 U/L (ref 38–126)
ALT: 12 U/L — AB (ref 14–54)
AST: 11 U/L — AB (ref 15–41)
Albumin: 2.3 g/dL — ABNORMAL LOW (ref 3.5–5.0)
Anion gap: 6 (ref 5–15)
BUN: 13 mg/dL (ref 6–20)
CALCIUM: 9 mg/dL (ref 8.9–10.3)
CHLORIDE: 106 mmol/L (ref 101–111)
CO2: 27 mmol/L (ref 22–32)
CREATININE: 0.5 mg/dL (ref 0.44–1.00)
GFR calc non Af Amer: >60 mL/min (ref >60)
Glucose, Bld: 174 mg/dL — ABNORMAL HIGH (ref 65–99)
Potassium: 3.7 mmol/L (ref 3.5–5.1)
SODIUM: 139 mmol/L (ref 135–145)
Total Bilirubin: 0.4 mg/dL (ref 0.3–1.2)
Total Protein: 6 g/dL — ABNORMAL LOW (ref 6.5–8.1)

## 2016-10-06 LAB — ECHOCARDIOGRAM COMPLETE
HEIGHTINCHES: 55 in
Weight: 1735.46 oz

## 2016-10-06 LAB — URINE CULTURE

## 2016-10-06 LAB — CBC
HCT: 33.5 % — ABNORMAL LOW (ref 36.0–46.0)
HEMOGLOBIN: 10.5 g/dL — AB (ref 12.0–15.0)
MCH: 27.6 pg (ref 26.0–34.0)
MCHC: 31.3 g/dL (ref 30.0–36.0)
MCV: 87.9 fL (ref 78.0–100.0)
PLATELETS: 318 10*3/uL (ref 150–400)
RBC: 3.81 MIL/uL — AB (ref 3.87–5.11)
RDW: 14.8 % (ref 11.5–15.5)
WBC: 15 10*3/uL — AB (ref 4.0–10.5)

## 2016-10-06 MED ORDER — FUROSEMIDE 20 MG PO TABS
10.0000 mg | ORAL_TABLET | Freq: Every day | ORAL | Status: DC
  Administered 2016-10-06 – 2016-10-07 (×2): 10 mg via ORAL
  Filled 2016-10-06 (×2): qty 1

## 2016-10-06 MED ORDER — LISINOPRIL 5 MG PO TABS: 5.0000 mg | ORAL_TABLET | Freq: Every day | ORAL | Status: DC

## 2016-10-06 MED ORDER — FUROSEMIDE 20 MG PO TABS: 10.0000 mg | ORAL_TABLET | Freq: Every day | ORAL | Status: DC

## 2016-10-06 MED ORDER — LISINOPRIL 5 MG PO TABS
5.0000 mg | ORAL_TABLET | Freq: Every day | ORAL | Status: DC
  Administered 2016-10-07: 5 mg via ORAL
  Filled 2016-10-06: qty 1

## 2016-10-06 NOTE — Progress Notes (Signed)
PROGRESS NOTE    Hannah Mckee  [id number] DOB: [birth year] DOA: 10/04/2016 PCP: Glenda Chroman, MD    Brief Narrative:  82 y.o.femalewith medical history significant of femur fracture; HTN; COPD (not on home O2); CHF (no echo available) presenting with PNA. Patient got sick 2 days after taking a flu shot (about 11/10). Does lots of cooking, baking, making candy, daughter thought she had worn herself out. The last week, more flu-like, has never felt so bad in her whole life. Coughing up bloody mucus. 2 nights where she wasn't sure she was going to wake up in the morning. Reports gasping for breath, "almost smothered to death." Nebs 4x/day, prior used on rarely. Nebs do loosen sputum. Sputum is dark brown, burgundy, or gray. Also with painful ?mass in side. The only way to make breathing better was to sit quietly alone. Unable to lie flat. Can't find thermometer - has not felt like she has fever but has had chills   Assessment & Plan:   Principal Problem:   Acute respiratory failure with hypoxia (HCC) Active Problems:   CHF (congestive heart failure) (HCC)   COPD (chronic obstructive pulmonary disease) (HCC)   CAP (community acquired pneumonia)   Sepsis ([place])   Elevated troponin   Hypokalemia   Moderate malnutrition (HCC)  Acute hypoxic respiratory failure  - from Community acquired pneumonia and COPD exacerbation - patient started on vancomycin, azithromycin, Rocephin - patient breathing easier this am and states that she feels better  - WBC still elevated 21,000 - Follow blood cultures, sputum culture. Follow urine strep pneumo antigen  History of CHF- BNP elevated 459  2-D echo: Mild LVH with LVEF approximately 35-40% in the setting of tachycardia. There is diffuse hypokinesis, most severe in the basal inferior wall. Probable grade 2 diastolic dysfunction. Severe left atrial enlargement. Calcified mitral annulus with mild to moderate mitral regurgitation.  Moderately calcified   aortic annulus. Moderate tricuspid regurgitation with PASP 15 mmHg.. Follow the results. Currently well compensated. - was previously on benazepril as well as bisoprolol and unclear why this was suddenly stopped - will restart lasix today at 10mg  daily  - will start low dose lisinopril tomorrow - stop IVF as patient tolerating PO  COPD exacerbation - improved - continue DuoNeb every 6 hours - Solu-Medrol 40 mg IV every 12 hours.   DVT prophylaxis: Lovenox Code Status: Full Code Family Communication: no family bedside Disposition Plan: home when improved and medically stable- hopefully in 2-3 days   Consultants:   Pulmonology  Procedures:   None  Antimicrobials:   Vancomycin 12/4   Rocephin 12/>  Azithromycin 12/4>  Cefepime 12/4>  Doxycycline 12/4   Subjective: Patient says she slept much better last night.  Voices she realizes how sick she was now and how she should have come to seek medical help earlier than she did.  Denies any chest pain or chest pressure.  Plans on eating a little breakfast (doesn't normally eat breakfast).  Objective: Vitals:   10/06/16 0200 10/06/16 0300 10/06/16 0400 10/06/16 0500  BP: (!) 122/50  (!) 125/52   Pulse: 94 (!) 110 91   Resp: (!) 26 (!) 27 (!) 22   Temp:   97.9 F (36.6 C)   TempSrc:   Oral   SpO2: 98% 97% 97%   Weight:    49.2 kg (108 lb 7.5 oz)  Height:        Intake/Output Summary (Last 24 hours) at 10/06/16 0756 Last data filed at  10/06/16 0700  Gross per 24 hour  Intake          2923.75 ml  Output              650 ml  Net          2273.75 ml   Filed Weights   10/04/16 2330 10/05/16 0500 10/06/16 0500  Weight: 48.6 kg (107 lb 2.3 oz) 48.6 kg (107 lb 2.3 oz) 49.2 kg (108 lb 7.5 oz)    Examination:  General exam: Appears calm and comfortable  Respiratory system:Respiratory effort normal. Soft breath sounds, no wheezing Cardiovascular system: S1 & S2 heard, RRR. No JVD, murmurs,  rubs, gallops or clicks. No pedal edema. Gastrointestinal system: Abdomen is nondistended, soft and nontender. No organomegaly or masses felt. Normal bowel sounds heard. Central nervous system: Alert and oriented. No focal neurological deficits. Extremities: Symmetric 5 x 5 power. Skin: No rashes, lesions or ulcers Psychiatry: Judgement and insight appear normal. Mood & affect appropriate.     Data Reviewed: I have personally reviewed following labs and imaging studies  CBC:  Recent Labs Lab 10/04/16 1725 10/05/16 0458 10/06/16 0417  WBC 19.7* 21.7* 15.0*  NEUTROABS 17.1* 18.9*  --   HGB 12.2 10.7* 10.5*  HCT 38.0 34.0* 33.5*  MCV 88.2 87.9 87.9  PLT 368 326 0000000   Basic Metabolic Panel:  Recent Labs Lab 10/04/16 1725 10/05/16 0458 10/06/16 0417  NA 135 135 139  K 3.3* 3.5 3.7  CL 99* 104 106  CO2 26 26 27   GLUCOSE 97 121* 174*  BUN 11 9 13   CREATININE 0.69 0.49 0.50  CALCIUM 9.3 8.6* 9.0   GFR: Estimated Creatinine Clearance: 33.7 mL/min (by C-G formula based on SCr of 0.5 mg/dL). Liver Function Tests:  Recent Labs Lab 10/04/16 1725 10/06/16 0417  AST 17 11*  ALT 11* 12*  ALKPHOS 126 105  BILITOT 0.8 0.4  PROT 7.6 6.0*  ALBUMIN 3.2* 2.3*   No results for input(s): LIPASE, AMYLASE in the last 168 hours. No results for input(s): AMMONIA in the last 168 hours. Coagulation Profile: No results for input(s): INR, PROTIME in the last 168 hours. Cardiac Enzymes:  Recent Labs Lab 10/04/16 1725 10/04/16 2040 10/04/16 2328 10/05/16 0458 10/05/16 1034  TROPONINI 0.04* 0.04* 0.05* 0.07* 0.06*   BNP (last 3 results) No results for input(s): PROBNP in the last 8760 hours. HbA1C: No results for input(s): HGBA1C in the last 72 hours. CBG: No results for input(s): GLUCAP in the last 168 hours. Lipid Profile: No results for input(s): CHOL, HDL, LDLCALC, TRIG, CHOLHDL, LDLDIRECT in the last 72 hours. Thyroid Function Tests: No results for input(s): TSH,  T4TOTAL, FREET4, T3FREE, THYROIDAB in the last 72 hours. Anemia Panel: No results for input(s): VITAMINB12, FOLATE, FERRITIN, TIBC, IRON, RETICCTPCT in the last 72 hours. Sepsis Labs:  Recent Labs Lab 10/04/16 2104  LATICACIDVEN 0.97    Recent Results (from the past 240 hour(s))  Blood Culture (routine x 2)     Status: None (Preliminary result)   Collection Time: 10/04/16  8:40 PM  Result Value Ref Range Status   Specimen Description LEFT ANTECUBITAL  Final   Special Requests BOTTLES DRAWN AEROBIC AND ANAEROBIC 6CC  Final   Culture PENDING  Incomplete   Report Status PENDING  Incomplete  Blood Culture (routine x 2)     Status: None (Preliminary result)   Collection Time: 10/04/16  8:44 PM  Result Value Ref Range Status   Specimen Description BLOOD  LEFT HAND  Final   Special Requests BOTTLES DRAWN AEROBIC AND ANAEROBIC 6CC  Final   Culture PENDING  Incomplete   Report Status PENDING  Incomplete  MRSA PCR Screening     Status: None   Collection Time: 10/04/16 11:20 PM  Result Value Ref Range Status   MRSA by PCR NEGATIVE NEGATIVE Final    Comment:        The GeneXpert MRSA Assay (FDA approved for NASAL specimens only), is one component of a comprehensive MRSA colonization surveillance program. It is not intended to diagnose MRSA infection nor to guide or monitor treatment for MRSA infections.   Culture, sputum-assessment     Status: None   Collection Time: 10/05/16  9:00 PM  Result Value Ref Range Status   Specimen Description SPUTUM  Final   Special Requests Normal  Final   Sputum evaluation   Final    THIS SPECIMEN IS ACCEPTABLE. RESPIRATORY CULTURE REPORT TO FOLLOW. PERFORMED AT [facility]    Report Status 10/05/2016 FINAL  Final         Radiology Studies: Dg Chest 2 View  Result Date: 10/04/2016 CLINICAL DATA:  Shortness of breath . EXAM: CHEST  2 VIEW COMPARISON:  10/28/2014. FINDINGS: Mediastinum hilar structures normal. Cardiomegaly with mild bibasilar  infiltrates small pleural effusions suggesting congestive heart failure. Low lung volumes. No pneumothorax. Thoracic spine kyphosis and diffuse osteopenia. IMPRESSION: 1. Cardiomegaly. Mild mild basilar infiltrates and/or edema with small pleural effusions. Congestive heart failure cannot be excluded. 2. Low lung volumes. Electronically Signed   By: Phelps   On: 10/04/2016 16:00        Scheduled Meds: . aspirin EC  81 mg Oral Daily  . azithromycin  500 mg Intravenous Q24H  . cefTRIAXone (ROCEPHIN)  IV  1 g Intravenous Q24H  . enoxaparin (LOVENOX) injection  40 mg Subcutaneous Q24H  . feeding supplement (ENSURE ENLIVE)  237 mL Oral BID BM  . guaiFENesin  1,200 mg Oral BID  . ipratropium-albuterol  3 mL Nebulization TID  . mouth rinse  15 mL Mouth Rinse BID  . methylPREDNISolone (SOLU-MEDROL) injection  40 mg Intravenous Q12H  . potassium chloride SA  20 mEq Oral Once per day on Mon Wed Fri  . vancomycin  750 mg Intravenous Q24H   Continuous Infusions: . lactated ringers 75 mL/hr at 10/04/16 2357     LOS: 2 days    Time spent: 35 minutes    Loretha Stapler, MD [facility] Pager [phone]  If 7PM-7AM, please contact night-coverage [email or web address] Password [email or web address] 10/06/2016, 7:56 AM

## 2016-10-06 NOTE — Progress Notes (Signed)
PT Cancellation Note  Patient Details Name: Hannah Mckee MRN: [id number] DOB: 08-09-1931   Cancelled Treatment:    Reason Eval/Treat Not Completed: Patient at procedure or test/unavailable (Pt having Echo performed.  Will check back tomorrow to perform initial evaluation. )  Beth Clytee Heinrich, PT, DPT X: [phone]

## 2016-10-06 NOTE — Progress Notes (Signed)
Subjective: She says she feels better. She is sleepy but arousable. Still coughing that is mildly productive. No other new complaints. No chest pain. No hemoptysis. No abdominal pain nausea vomiting diarrhea.  Objective: Vital signs in last 24 hours: Temp:  [97.9 F (36.6 C)-98.6 F (37 C)] 97.9 F (36.6 C) (12/06 0400) Pulse Rate:  [86-110] 91 (12/06 0400) Resp:  [18-35] 22 (12/06 0400) BP: (108-160)/(43-74) 125/52 (12/06 0400) SpO2:  [90 %-98 %] 97 % (12/06 0400) Weight:  [49.2 kg (108 lb 7.5 oz)] 49.2 kg (108 lb 7.5 oz) (12/06 0500) Weight change: 0.665 kg (1 lb 7.5 oz) Last BM Date: 10/05/16  Intake/Output from previous day: 12/05 0701 - 12/06 0700 In: 2923.8 [P.O.:120; I.V.:2103.8; IV Piggyback:700] Out: 650 [Urine:650]  PHYSICAL EXAM General appearance: alert, cooperative, no distress and Very thin Resp: She doesn't have any wheezing now. She has decreased breath sounds with prolonged expiratory phase Cardio: regular rate and rhythm, S1, S2 normal, no murmur, click, rub or gallop GI: soft, non-tender; bowel sounds normal; no masses,  no organomegaly Extremities: extremities normal, atraumatic, no cyanosis or edema Pupils react. Mucous membranes are moist. Skin warm and dry  Lab Results:  Results for orders placed or performed during the hospital encounter of 10/04/16 (from the past 48 hour(s))  CBC with Differential     Status: Abnormal   Collection Time: 10/04/16  5:25 PM  Result Value Ref Range   WBC 19.7 (H) 4.0 - 10.5 K/uL   RBC 4.31 3.87 - 5.11 MIL/uL   Hemoglobin 12.2 12.0 - 15.0 g/dL   HCT 38.0 36.0 - 46.0 %   MCV 88.2 78.0 - 100.0 fL   MCH 28.3 26.0 - 34.0 pg   MCHC 32.1 30.0 - 36.0 g/dL   RDW 14.5 11.5 - 15.5 %   Platelets 368 150 - 400 K/uL   Neutrophils Relative % 86 %   Neutro Abs 17.1 (H) 1.7 - 7.7 K/uL   Lymphocytes Relative 5 %   Lymphs Abs 0.9 0.7 - 4.0 K/uL   Monocytes Relative 8 %   Monocytes Absolute 1.5 (H) 0.1 - 1.0 K/uL   Eosinophils  Relative 1 %   Eosinophils Absolute 0.1 0.0 - 0.7 K/uL   Basophils Relative 0 %   Basophils Absolute 0.1 0.0 - 0.1 K/uL  Comprehensive metabolic panel     Status: Abnormal   Collection Time: 10/04/16  5:25 PM  Result Value Ref Range   Sodium 135 135 - 145 mmol/L   Potassium 3.3 (L) 3.5 - 5.1 mmol/L   Chloride 99 (L) 101 - 111 mmol/L   CO2 26 22 - 32 mmol/L   Glucose, Bld 97 65 - 99 mg/dL   BUN 11 6 - 20 mg/dL   Creatinine, Ser 0.69 0.44 - 1.00 mg/dL   Calcium 9.3 8.9 - 10.3 mg/dL   Total Protein 7.6 6.5 - 8.1 g/dL   Albumin 3.2 (L) 3.5 - 5.0 g/dL   AST 17 15 - 41 U/L   ALT 11 (L) 14 - 54 U/L   Alkaline Phosphatase 126 38 - 126 U/L   Total Bilirubin 0.8 0.3 - 1.2 mg/dL   GFR calc non Af Amer >60 >60 mL/min   GFR calc Af Amer >60 >60 mL/min    Comment: (NOTE) The eGFR has been calculated using the CKD EPI equation. This calculation has not been validated in all clinical situations. eGFR's persistently <60 mL/min signify possible Chronic Kidney Disease.    Anion gap 10 5 -  15  Troponin I     Status: Abnormal   Collection Time: 10/04/16  5:25 PM  Result Value Ref Range   Troponin I 0.04 (HH) <0.03 ng/mL    Comment: CRITICAL RESULT CALLED TO, READ BACK BY AND VERIFIED WITH: CARDWELL,L ON 10/04/16 AT 1855 BY LOY,C   Urinalysis, Routine w reflex microscopic (not at [facility])     Status: Abnormal   Collection Time: 10/04/16  7:28 PM  Result Value Ref Range   Color, Urine YELLOW YELLOW   APPearance CLEAR CLEAR   Specific Gravity, Urine 1.020 1.005 - 1.030   pH 6.0 5.0 - 8.0   Glucose, UA NEGATIVE NEGATIVE mg/dL   Hgb urine dipstick LARGE (A) NEGATIVE   Bilirubin Urine SMALL (A) NEGATIVE   Ketones, ur >80 (A) NEGATIVE mg/dL   Protein, ur 975 (A) NEGATIVE mg/dL   Nitrite NEGATIVE NEGATIVE   Leukocytes, UA TRACE (A) NEGATIVE  Urine microscopic-add on     Status: Abnormal   Collection Time: 10/04/16  7:28 PM  Result Value Ref Range   Squamous Epithelial / LPF 0-5 (A) NONE SEEN    WBC, UA 0-5 0 - 5 WBC/hpf   RBC / HPF TOO NUMEROUS TO COUNT 0 - 5 RBC/hpf   Bacteria, UA RARE (A) NONE SEEN  Influenza panel by PCR (type A & B, H1N1)     Status: None   Collection Time: 10/04/16  7:34 PM  Result Value Ref Range   Influenza A By PCR NEGATIVE NEGATIVE   Influenza B By PCR NEGATIVE NEGATIVE    Comment: (NOTE) The Xpert Xpress Flu assay is intended as an aid in the diagnosis of  influenza and should not be used as a sole basis for treatment.  This  assay is FDA approved for nasopharyngeal swab specimens only. Nasal  washings and aspirates are unacceptable for Xpert Xpress Flu testing.   Blood Culture (routine x 2)     Status: None (Preliminary result)   Collection Time: 10/04/16  8:40 PM  Result Value Ref Range   Specimen Description LEFT ANTECUBITAL    Special Requests BOTTLES DRAWN AEROBIC AND ANAEROBIC 6CC    Culture PENDING    Report Status PENDING   Troponin I     Status: Abnormal   Collection Time: 10/04/16  8:40 PM  Result Value Ref Range   Troponin I 0.04 (HH) <0.03 ng/mL    Comment: CRITICAL VALUE NOTED.  VALUE IS CONSISTENT WITH PREVIOUSLY REPORTED AND CALLED VALUE.  Blood Culture (routine x 2)     Status: None (Preliminary result)   Collection Time: 10/04/16  8:44 PM  Result Value Ref Range   Specimen Description BLOOD LEFT HAND    Special Requests BOTTLES DRAWN AEROBIC AND ANAEROBIC 6CC    Culture PENDING    Report Status PENDING   Brain natriuretic peptide     Status: Abnormal   Collection Time: 10/04/16  8:44 PM  Result Value Ref Range   B Natriuretic Peptide 459.0 (H) 0.0 - 100.0 pg/mL  I-Stat CG4 Lactic Acid, ED  (not at  [facility])     Status: None   Collection Time: 10/04/16  9:04 PM  Result Value Ref Range   Lactic Acid, Venous 0.97 0.5 - 1.9 mmol/L  MRSA PCR Screening     Status: None   Collection Time: 10/04/16 11:20 PM  Result Value Ref Range   MRSA by PCR NEGATIVE NEGATIVE    Comment:        The GeneXpert  MRSA Assay (FDA approved for  NASAL specimens only), is one component of a comprehensive MRSA colonization surveillance program. It is not intended to diagnose MRSA infection nor to guide or monitor treatment for MRSA infections.   Blood gas, arterial     Status: Abnormal   Collection Time: 10/04/16 11:22 PM  Result Value Ref Range   O2 Content 2.0 L/min   Delivery systems NASAL CANNULA    pH, Arterial 7.374 7.350 - 7.450   pCO2 arterial 39.6 32.0 - 48.0 mmHg   pO2, Arterial 77.9 (L) 83.0 - 108.0 mmHg   Bicarbonate 22.8 20.0 - 28.0 mmol/L   Acid-base deficit 1.9 0.0 - 2.0 mmol/L   O2 Saturation 94.9 %   Patient temperature 37.0    Collection site LEFT RADIAL    Drawn by [id number]    Sample type ARTERIAL    Allens test (pass/fail) PASS PASS  Troponin I (q 6hr x 3)     Status: Abnormal   Collection Time: 10/04/16 11:28 PM  Result Value Ref Range   Troponin I 0.05 (HH) <0.03 ng/mL    Comment: CRITICAL VALUE NOTED.  VALUE IS CONSISTENT WITH PREVIOUSLY REPORTED AND CALLED VALUE.  Troponin I (q 6hr x 3)     Status: Abnormal   Collection Time: 10/05/16  4:58 AM  Result Value Ref Range   Troponin I 0.07 (HH) <0.03 ng/mL    Comment: CRITICAL RESULT CALLED TO, READ BACK BY AND VERIFIED WITH: ROBERTS,T AT 6:10AM ON 10/05/16 BY [facility]   Basic metabolic panel     Status: Abnormal   Collection Time: 10/05/16  4:58 AM  Result Value Ref Range   Sodium 135 135 - 145 mmol/L   Potassium 3.5 3.5 - 5.1 mmol/L   Chloride 104 101 - 111 mmol/L   CO2 26 22 - 32 mmol/L   Glucose, Bld 121 (H) 65 - 99 mg/dL   BUN 9 6 - 20 mg/dL   Creatinine, Ser 0.49 0.44 - 1.00 mg/dL   Calcium 8.6 (L) 8.9 - 10.3 mg/dL   GFR calc non Af Amer >60 >60 mL/min   GFR calc Af Amer >60 >60 mL/min    Comment: (NOTE) The eGFR has been calculated using the CKD EPI equation. This calculation has not been validated in all clinical situations. eGFR's persistently <60 mL/min signify possible Chronic Kidney Disease.    Anion gap 5 5 - 15  CBC WITH  DIFFERENTIAL     Status: Abnormal   Collection Time: 10/05/16  4:58 AM  Result Value Ref Range   WBC 21.7 (H) 4.0 - 10.5 K/uL   RBC 3.87 3.87 - 5.11 MIL/uL   Hemoglobin 10.7 (L) 12.0 - 15.0 g/dL   HCT 34.0 (L) 36.0 - 46.0 %   MCV 87.9 78.0 - 100.0 fL   MCH 27.6 26.0 - 34.0 pg   MCHC 31.5 30.0 - 36.0 g/dL   RDW 14.6 11.5 - 15.5 %   Platelets 326 150 - 400 K/uL   Neutrophils Relative % 88 %   Neutro Abs 18.9 (H) 1.7 - 7.7 K/uL   Lymphocytes Relative 3 %   Lymphs Abs 0.7 0.7 - 4.0 K/uL   Monocytes Relative 9 %   Monocytes Absolute 2.0 (H) 0.1 - 1.0 K/uL   Eosinophils Relative 0 %   Eosinophils Absolute 0.0 0.0 - 0.7 K/uL   Basophils Relative 0 %   Basophils Absolute 0.0 0.0 - 0.1 K/uL  Troponin I (q 6hr x 3)  Status: Abnormal   Collection Time: 10/05/16 10:34 AM  Result Value Ref Range   Troponin I 0.06 (HH) <0.03 ng/mL    Comment: CRITICAL VALUE NOTED.  VALUE IS CONSISTENT WITH PREVIOUSLY REPORTED AND CALLED VALUE.  Strep pneumoniae urinary antigen     Status: None   Collection Time: 10/05/16  1:46 PM  Result Value Ref Range   Strep Pneumo Urinary Antigen NEGATIVE NEGATIVE    Comment: Performed at [facility]        Infection due to S. pneumoniae cannot be absolutely ruled out since the antigen present may be below the detection limit of the test.   Culture, sputum-assessment     Status: None   Collection Time: 10/05/16  9:00 PM  Result Value Ref Range   Specimen Description SPUTUM    Special Requests Normal    Sputum evaluation      THIS SPECIMEN IS ACCEPTABLE. RESPIRATORY CULTURE REPORT TO FOLLOW. PERFORMED AT [facility]    Report Status 10/05/2016 FINAL   CBC     Status: Abnormal   Collection Time: 10/06/16  4:17 AM  Result Value Ref Range   WBC 15.0 (H) 4.0 - 10.5 K/uL   RBC 3.81 (L) 3.87 - 5.11 MIL/uL   Hemoglobin 10.5 (L) 12.0 - 15.0 g/dL   HCT 99.8 (L) 48.9 - 24.4 %   MCV 87.9 78.0 - 100.0 fL   MCH 27.6 26.0 - 34.0 pg   MCHC 31.3 30.0 - 36.0 g/dL    RDW 97.4 58.4 - 12.6 %   Platelets 318 150 - 400 K/uL  Comprehensive metabolic panel     Status: Abnormal   Collection Time: 10/06/16  4:17 AM  Result Value Ref Range   Sodium 139 135 - 145 mmol/L   Potassium 3.7 3.5 - 5.1 mmol/L   Chloride 106 101 - 111 mmol/L   CO2 27 22 - 32 mmol/L   Glucose, Bld 174 (H) 65 - 99 mg/dL   BUN 13 6 - 20 mg/dL   Creatinine, Ser 2.19 0.44 - 1.00 mg/dL   Calcium 9.0 8.9 - 99.4 mg/dL   Total Protein 6.0 (L) 6.5 - 8.1 g/dL   Albumin 2.3 (L) 3.5 - 5.0 g/dL   AST 11 (L) 15 - 41 U/L   ALT 12 (L) 14 - 54 U/L   Alkaline Phosphatase 105 38 - 126 U/L   Total Bilirubin 0.4 0.3 - 1.2 mg/dL   GFR calc non Af Amer >60 >60 mL/min   GFR calc Af Amer >60 >60 mL/min    Comment: (NOTE) The eGFR has been calculated using the CKD EPI equation. This calculation has not been validated in all clinical situations. eGFR's persistently <60 mL/min signify possible Chronic Kidney Disease.    Anion gap 6 5 - 15    ABGS  Recent Labs  10/04/16 2322  PHART 7.374  PO2ART 77.9*  HCO3 22.8   CULTURES Recent Results (from the past 240 hour(s))  Blood Culture (routine x 2)     Status: None (Preliminary result)   Collection Time: 10/04/16  8:40 PM  Result Value Ref Range Status   Specimen Description LEFT ANTECUBITAL  Final   Special Requests BOTTLES DRAWN AEROBIC AND ANAEROBIC 6CC  Final   Culture PENDING  Incomplete   Report Status PENDING  Incomplete  Blood Culture (routine x 2)     Status: None (Preliminary result)   Collection Time: 10/04/16  8:44 PM  Result Value Ref Range Status   Specimen  Description BLOOD LEFT HAND  Final   Special Requests BOTTLES DRAWN AEROBIC AND ANAEROBIC 6CC  Final   Culture PENDING  Incomplete   Report Status PENDING  Incomplete  MRSA PCR Screening     Status: None   Collection Time: 10/04/16 11:20 PM  Result Value Ref Range Status   MRSA by PCR NEGATIVE NEGATIVE Final    Comment:        The GeneXpert MRSA Assay (FDA approved for  NASAL specimens only), is one component of a comprehensive MRSA colonization surveillance program. It is not intended to diagnose MRSA infection nor to guide or monitor treatment for MRSA infections.   Culture, sputum-assessment     Status: None   Collection Time: 10/05/16  9:00 PM  Result Value Ref Range Status   Specimen Description SPUTUM  Final   Special Requests Normal  Final   Sputum evaluation   Final    THIS SPECIMEN IS ACCEPTABLE. RESPIRATORY CULTURE REPORT TO FOLLOW. PERFORMED AT [facility]    Report Status 10/05/2016 FINAL  Final   Studies/Results: Dg Chest 2 View  Result Date: 10/04/2016 CLINICAL DATA:  Shortness of breath . EXAM: CHEST  2 VIEW COMPARISON:  10/28/2014. FINDINGS: Mediastinum hilar structures normal. Cardiomegaly with mild bibasilar infiltrates small pleural effusions suggesting congestive heart failure. Low lung volumes. No pneumothorax. Thoracic spine kyphosis and diffuse osteopenia. IMPRESSION: 1. Cardiomegaly. Mild mild basilar infiltrates and/or edema with small pleural effusions. Congestive heart failure cannot be excluded. 2. Low lung volumes. Electronically Signed   By: Marcello Moores  Register   On: 10/04/2016 16:00    Medications:  Prior to Admission:  Prescriptions Prior to Admission  Medication Sig Dispense Refill Last Dose  . aspirin EC 81 MG tablet Take 81 mg by mouth daily.   Past Week at Unknown time  . clonazePAM (KLONOPIN) 0.5 MG tablet Take 0.125 mg by mouth daily as needed (FOR BP LEVELS).   10/03/2016 at Unknown time  . furosemide (LASIX) 40 MG tablet Take 0.5 tablets (20 mg total) by mouth daily. (Patient taking differently: Take 20 mg by mouth 3 (three) times a week. ) 30 tablet  Past Week at Unknown time  . HYDROcodone-acetaminophen (NORCO/VICODIN) 5-325 MG tablet Take 0.5-1 tablets by mouth daily as needed for moderate pain.   Past Week at Unknown time  . ipratropium-albuterol (DUONEB) 0.5-2.5 (3) MG/3ML SOLN Take 3 mLs by nebulization every 4  (four) hours as needed. 360 mL  10/04/2016 at Unknown time  . polyethylene glycol (MIRALAX / GLYCOLAX) packet Take 17 g by mouth 2 (two) times daily. (Patient taking differently: Take 17 g by mouth daily as needed for mild constipation. ) 14 each 0 10/04/2016 at Unknown time  . potassium chloride SA (K-DUR,KLOR-CON) 20 MEQ tablet Take 20 mEq by mouth 3 (three) times a week. Takes only when Furosemide is taken   Past Week at Unknown time  . PROAIR HFA 108 (90 Base) MCG/ACT inhaler Inhale 1-2 puffs into the lungs every 6 (six) hours as needed for shortness of breath.    unknown   Scheduled: . aspirin EC  81 mg Oral Daily  . azithromycin  500 mg Intravenous Q24H  . cefTRIAXone (ROCEPHIN)  IV  1 g Intravenous Q24H  . enoxaparin (LOVENOX) injection  40 mg Subcutaneous Q24H  . feeding supplement (ENSURE ENLIVE)  237 mL Oral BID BM  . guaiFENesin  1,200 mg Oral BID  . ipratropium-albuterol  3 mL Nebulization TID  . mouth rinse  15 mL Mouth  Rinse BID  . methylPREDNISolone (SOLU-MEDROL) injection  40 mg Intravenous Q12H  . potassium chloride SA  20 mEq Oral Once per day on Mon Wed Fri  . vancomycin  750 mg Intravenous Q24H   Continuous: . lactated ringers 75 mL/hr at 10/04/16 2357   VXB:[id number], clonazePAM, HYDROcodone-acetaminophen, polyethylene glycol  Assesment:She was admitted with acute hypoxic respiratory failure on the basis of COPD exacerbation and community-acquired pneumonia. She had sepsis on admission and that has resolved. Her troponin level has been elevated and that is felt to be related to demand ischemia. She is significantly better than on admission. Her white blood count has come down. She is breathing much more comfortably  She has moderate malnutrition and is on nutritional supplements. Principal Problem:   Acute respiratory failure with hypoxia (HCC) Active Problems:   CHF (congestive heart failure) (HCC)   COPD (chronic obstructive pulmonary disease) (HCC)   CAP  (community acquired pneumonia)   Sepsis ([place])   Elevated troponin   Hypokalemia   Moderate malnutrition ([place])    Plan: Continue current treatments.    LOS: 2 days   Darden Flemister L 10/06/2016, 7:49 AM

## 2016-10-06 NOTE — Progress Notes (Signed)
*  PRELIMINARY RESULTS* Echocardiogram 2D Echocardiogram has been performed.  Hannah Mckee 10/06/2016, 3:03 PM

## 2016-10-07 ENCOUNTER — Inpatient Hospital Stay (HOSPITAL_COMMUNITY): Payer: Medicare Other

## 2016-10-07 DIAGNOSIS — I5021 Acute systolic (congestive) heart failure: Secondary | ICD-10-CM

## 2016-10-07 DIAGNOSIS — J9601 Acute respiratory failure with hypoxia: Secondary | ICD-10-CM

## 2016-10-07 DIAGNOSIS — R748 Abnormal levels of other serum enzymes: Secondary | ICD-10-CM

## 2016-10-07 LAB — BASIC METABOLIC PANEL
ANION GAP: 5 (ref 5–15)
BUN: 19 mg/dL (ref 6–20)
CALCIUM: 9 mg/dL (ref 8.9–10.3)
CHLORIDE: 104 mmol/L (ref 101–111)
CO2: 30 mmol/L (ref 22–32)
CREATININE: 0.61 mg/dL (ref 0.44–1.00)
GFR calc non Af Amer: >60 mL/min (ref >60)
Glucose, Bld: 146 mg/dL — ABNORMAL HIGH (ref 65–99)
Potassium: 4.3 mmol/L (ref 3.5–5.1)
SODIUM: 139 mmol/L (ref 135–145)

## 2016-10-07 MED ORDER — METOPROLOL SUCCINATE ER 25 MG PO TB24
12.5000 mg | ORAL_TABLET | Freq: Every day | ORAL | Status: DC
  Administered 2016-10-07: 12.5 mg via ORAL
  Filled 2016-10-07: qty 1

## 2016-10-07 MED ORDER — FUROSEMIDE 10 MG/ML IJ SOLN
40.0000 mg | Freq: Every day | INTRAMUSCULAR | Status: AC
  Administered 2016-10-07 – 2016-10-08 (×2): 40 mg via INTRAVENOUS
  Filled 2016-10-07 (×2): qty 4

## 2016-10-07 NOTE — Plan of Care (Signed)
Problem: Activity: Goal: Risk for activity intolerance will decrease Outcome: Progressing Patient doing well getting up and moving in room with assistance.

## 2016-10-07 NOTE — Progress Notes (Signed)
PROGRESS NOTE    Hannah Mckee  [id number] DOB: 19-Jan-1931 DOA: 10/04/2016 PCP: Glenda Chroman, MD    Brief Narrative:  82 y.o.femalewith medical history significant of femur fracture; HTN; COPD (not on home O2); CHF (no echo available) presenting with PNA. Patient got sick 2 days after taking a flu shot (about 11/10). Does lots of cooking, baking, making candy, daughter thought she had worn herself out. The last week, more flu-like, has never felt so bad in her whole life. Coughing up bloody mucus. 2 nights where she wasn't sure she was going to wake up in the morning. Reports gasping for breath, "almost smothered to death." Nebs 4x/day, prior used on rarely. Nebs do loosen sputum. Sputum is dark brown, burgundy, or gray. Also with painful ?mass in side. The only way to make breathing better was to sit quietly alone. Unable to lie flat. Can't find thermometer - has not felt like she has fever but has had chills   Assessment & Plan:   Principal Problem:   Acute respiratory failure with hypoxia (HCC) Active Problems:   CHF (congestive heart failure) (HCC)   COPD (chronic obstructive pulmonary disease) (HCC)   CAP (community acquired pneumonia)   Sepsis ([place])   Elevated troponin   Hypokalemia   Moderate malnutrition (HCC)  Acute hypoxic respiratory failure  - from Community acquired pneumonia,  COPD exacerbation and heart failure -She presented with hypoxia, tachypnea, tachycardia, leukocytosis,  no fever - patient was started on vancomycin, azithromycin, Rocephin since admission, vanc d/ced on 12/6 after mrsa screening negative -  WBC still elevated, sputum culture in process,  blood cultures no growth,, urine strep pneumo antigen negative  Acute systolic  CHF- BNP elevated 459  2-D echo: Mild LVH with LVEF approximately 35-40% in the setting of tachycardia. There is diffuse hypokinesis, most severe in the basal inferior wall. Probable grade 2 diastolic dysfunction.  Severe left atrial enlargement. Calcified mitral annulus with mild to moderate mitral regurgitation. Moderately calcified   aortic annulus. Moderate tricuspid regurgitation with PASP 15 mmHg.. Follow the results. Currently well compensated. - patient report h/o having heart failure during infection in the past,  - iv lasix started on 12/7 due to worsening of pulmonary edema, on lisinopril, cardiology started betablocker -cardiology consulted on 12/7   COPD exacerbation - improved - continue DuoNeb every 6 hours - Solu-Medrol 40 mg IV every 12 hours. -Dr Standley Dakins following  Leukocytosis:  From pna? Also on steroids She also report loose stool, no abdominal pain, no n/v, c diff ordered ,if no bm in 24hrs, canc d/c c diff order  Hypokalemia: corrected, check mag    DVT prophylaxis: Lovenox Code Status: Full Code Family Communication: patient and daughter at bedside Disposition Plan: home when improved and medically stable- hopefully in 2-3 days   Consultants:   Pulmonology  cardiology  Procedures:   None  Antimicrobials:   Vancomycin 12/4   Rocephin 12/>  Azithromycin 12/4>  Cefepime 12/4>  Doxycycline 12/4   Subjective: States has more trouble breathing this morning, she continue to cough  Objective: Vitals:   10/07/16 0727 10/07/16 0800 10/07/16 0855 10/07/16 1000  BP:  (!) 156/84  (!) 148/72  Pulse:  (!) 114  (!) 102  Resp:  19  17  Temp:   98.1 F (36.7 C)   TempSrc:   Oral   SpO2: 99% 97%  98%  Weight:      Height:        Intake/Output Summary (Last  24 hours) at 10/07/16 1323 Last data filed at 10/07/16 0830  Gross per 24 hour  Intake             1470 ml  Output             1301 ml  Net              169 ml   Filed Weights   10/05/16 0500 10/06/16 0500 10/07/16 0500  Weight: 48.6 kg (107 lb 2.3 oz) 49.2 kg (108 lb 7.5 oz) 49.5 kg (109 lb 2 oz)    Examination:  General exam: frail, NAD  Respiratory system:diminished at basis,  no  wheezing Cardiovascular system: tachycardia. No pedal edema. Gastrointestinal system: Abdomen is nondistended, soft and nontender. No organomegaly or masses felt. Normal bowel sounds heard. Central nervous system: Alert and oriented. No focal neurological deficits. Extremities: Symmetric 5 x 5 power. Skin: No rashes, lesions or ulcers Psychiatry: Judgement and insight appear normal. Mood & affect appropriate.     Data Reviewed: I have personally reviewed following labs and imaging studies  CBC:  Recent Labs Lab 10/04/16 1725 10/05/16 0458 10/06/16 0417  WBC 19.7* 21.7* 15.0*  NEUTROABS 17.1* 18.9*  --   HGB 12.2 10.7* 10.5*  HCT 38.0 34.0* 33.5*  MCV 88.2 87.9 87.9  PLT 368 326 0000000   Basic Metabolic Panel:  Recent Labs Lab 10/04/16 1725 10/05/16 0458 10/06/16 0417 10/07/16 0438  NA 135 135 139 139  K 3.3* 3.5 3.7 4.3  CL 99* 104 106 104  CO2 26 26 27 30   GLUCOSE 97 121* 174* 146*  BUN 11 9 13 19   CREATININE 0.69 0.49 0.50 0.61  CALCIUM 9.3 8.6* 9.0 9.0   GFR: Estimated Creatinine Clearance: 33.8 mL/min (by C-G formula based on SCr of 0.61 mg/dL). Liver Function Tests:  Recent Labs Lab 10/04/16 1725 10/06/16 0417  AST 17 11*  ALT 11* 12*  ALKPHOS 126 105  BILITOT 0.8 0.4  PROT 7.6 6.0*  ALBUMIN 3.2* 2.3*   No results for input(s): LIPASE, AMYLASE in the last 168 hours. No results for input(s): AMMONIA in the last 168 hours. Coagulation Profile: No results for input(s): INR, PROTIME in the last 168 hours. Cardiac Enzymes:  Recent Labs Lab 10/04/16 1725 10/04/16 2040 10/04/16 2328 10/05/16 0458 10/05/16 1034  TROPONINI 0.04* 0.04* 0.05* 0.07* 0.06*   BNP (last 3 results) No results for input(s): PROBNP in the last 8760 hours. HbA1C: No results for input(s): HGBA1C in the last 72 hours. CBG: No results for input(s): GLUCAP in the last 168 hours. Lipid Profile: No results for input(s): CHOL, HDL, LDLCALC, TRIG, CHOLHDL, LDLDIRECT in the last  72 hours. Thyroid Function Tests: No results for input(s): TSH, T4TOTAL, FREET4, T3FREE, THYROIDAB in the last 72 hours. Anemia Panel: No results for input(s): VITAMINB12, FOLATE, FERRITIN, TIBC, IRON, RETICCTPCT in the last 72 hours. Sepsis Labs:  Recent Labs Lab 10/04/16 2104  LATICACIDVEN 0.97    Recent Results (from the past 240 hour(s))  Urine culture     Status: Abnormal   Collection Time: 10/04/16  7:28 PM  Result Value Ref Range Status   Specimen Description URINE, CLEAN CATCH  Final   Special Requests NONE  Final   Culture MULTIPLE SPECIES PRESENT, SUGGEST RECOLLECTION (A)  Final   Report Status 10/06/2016 FINAL  Final  Blood Culture (routine x 2)     Status: None (Preliminary result)   Collection Time: 10/04/16  8:40 PM  Result Value Ref  Range Status   Specimen Description LEFT ANTECUBITAL  Final   Special Requests BOTTLES DRAWN AEROBIC AND ANAEROBIC 6CC  Final   Culture NO GROWTH 2 DAYS  Final   Report Status PENDING  Incomplete  Blood Culture (routine x 2)     Status: None (Preliminary result)   Collection Time: 10/04/16  8:44 PM  Result Value Ref Range Status   Specimen Description BLOOD LEFT HAND  Final   Special Requests BOTTLES DRAWN AEROBIC AND ANAEROBIC 6CC  Final   Culture NO GROWTH 2 DAYS  Final   Report Status PENDING  Incomplete  MRSA PCR Screening     Status: None   Collection Time: 10/04/16 11:20 PM  Result Value Ref Range Status   MRSA by PCR NEGATIVE NEGATIVE Final    Comment:        The GeneXpert MRSA Assay (FDA approved for NASAL specimens only), is one component of a comprehensive MRSA colonization surveillance program. It is not intended to diagnose MRSA infection nor to guide or monitor treatment for MRSA infections.   Culture, sputum-assessment     Status: None   Collection Time: 10/05/16  9:00 PM  Result Value Ref Range Status   Specimen Description SPUTUM  Final   Special Requests Normal  Final   Sputum evaluation   Final     THIS SPECIMEN IS ACCEPTABLE. RESPIRATORY CULTURE REPORT TO FOLLOW. PERFORMED AT [facility]    Report Status 10/05/2016 FINAL  Final  Culture, respiratory (NON-Expectorated)     Status: None (Preliminary result)   Collection Time: 10/05/16  9:00 PM  Result Value Ref Range Status   Specimen Description SPUTUM  Final   Special Requests NONE  Final   Gram Stain NO WBC SEEN FEW YEAST RARE GRAM POSITIVE RODS   Final   Culture   Final    CULTURE REINCUBATED FOR BETTER GROWTH Performed at [facility]    Report Status PENDING  Incomplete         Radiology Studies: Dg Chest Port 1 View  Result Date: 10/07/2016 CLINICAL DATA:  Pneumonia short of breath EXAM: PORTABLE CHEST 1 VIEW COMPARISON:  10/04/2016 FINDINGS: Progression of bibasilar airspace disease. Progression of pulmonary vascular congestion and bilateral effusions which may be due to heart failure. IMPRESSION: Progression of vascular congestion and bilateral airspace disease most suggestive of pulmonary edema and bilateral effusions. Bibasilar airspace disease may be atelectasis or pneumonia. This has progressed in the interval. Electronically Signed   By: Franchot Gallo M.D.   On: 10/07/2016 09:04        Scheduled Meds: . aspirin EC  81 mg Oral Daily  . azithromycin  500 mg Intravenous Q24H  . cefTRIAXone (ROCEPHIN)  IV  1 g Intravenous Q24H  . enoxaparin (LOVENOX) injection  40 mg Subcutaneous Q24H  . feeding supplement (ENSURE ENLIVE)  237 mL Oral BID BM  . furosemide  40 mg Intravenous Daily  . guaiFENesin  1,200 mg Oral BID  . ipratropium-albuterol  3 mL Nebulization TID  . lisinopril  5 mg Oral Daily  . mouth rinse  15 mL Mouth Rinse BID  . methylPREDNISolone (SOLU-MEDROL) injection  40 mg Intravenous Q12H  . potassium chloride SA  20 mEq Oral Once per day on Mon Wed Fri   Continuous Infusions:    LOS: 3 days    Time spent: 80 minutes    Oakley Orban, MD PhD [facility] Pager [phone]  If  7PM-7AM, please contact night-coverage [email or web address] Password [email or web address] 10/07/2016, 1:23  PM

## 2016-10-07 NOTE — Progress Notes (Signed)
Subjective: She is awake and alert. She's having more trouble breathing she says this morning. She's had more issues with her heart rate this morning. She's coughing nonproductively. She feels like her chest is "tight". No chest pain. No nausea vomiting diarrhea.  Objective: Vital signs in last 24 hours: Temp:  [97.5 F (36.4 C)-98.6 F (37 C)] 97.7 F (36.5 C) (12/07 0400) Pulse Rate:  [91-121] 102 (12/07 0600) Resp:  [18-30] 18 (12/07 0600) BP: (115-159)/(58-83) 144/80 (12/07 0600) SpO2:  [93 %-99 %] 99 % (12/07 0727) Weight:  [49.5 kg (109 lb 2 oz)] 49.5 kg (109 lb 2 oz) (12/07 0500) Weight change: 0.3 kg (10.6 oz) Last BM Date: 10/06/16  Intake/Output from previous day: 12/06 0701 - 12/07 0700 In: 5072 [P.O.:600; I.V.:750; IV Piggyback:300] Out: 1501 [Urine:1500; Stool:1]  PHYSICAL EXAM General appearance: She looks more short of breath than yesterday. She is very thin Resp: She has rhonchi bilaterally. Respiratory rate around 30 mildly labored Cardio: She is tachycardic. Occasional extra beat. GI: soft, non-tender; bowel sounds normal; no masses,  no organomegaly Extremities: extremities normal, atraumatic, no cyanosis or edema Her skin is warm and dry. Mucous membranes are moist.  Lab Results:  Results for orders placed or performed during the hospital encounter of 10/04/16 (from the past 48 hour(s))  Troponin I (q 6hr x 3)     Status: Abnormal   Collection Time: 10/05/16 10:34 AM  Result Value Ref Range   Troponin I 0.06 (HH) <0.03 ng/mL    Comment: CRITICAL VALUE NOTED.  VALUE IS CONSISTENT WITH PREVIOUSLY REPORTED AND CALLED VALUE.  Strep pneumoniae urinary antigen     Status: None   Collection Time: 10/05/16  1:46 PM  Result Value Ref Range   Strep Pneumo Urinary Antigen NEGATIVE NEGATIVE    Comment: Performed at [facility]        Infection due to S. pneumoniae cannot be absolutely ruled out since the antigen present may be below the detection  limit of the test.   Culture, sputum-assessment     Status: None   Collection Time: 10/05/16  9:00 PM  Result Value Ref Range   Specimen Description SPUTUM    Special Requests Normal    Sputum evaluation      THIS SPECIMEN IS ACCEPTABLE. RESPIRATORY CULTURE REPORT TO FOLLOW. PERFORMED AT [facility]    Report Status 10/05/2016 FINAL   Culture, respiratory (NON-Expectorated)     Status: None (Preliminary result)   Collection Time: 10/05/16  9:00 PM  Result Value Ref Range   Specimen Description SPUTUM    Special Requests NONE    Gram Stain      NO WBC SEEN FEW YEAST RARE GRAM POSITIVE RODS Performed at [facility]    Culture PENDING    Report Status PENDING   CBC     Status: Abnormal   Collection Time: 10/06/16  4:17 AM  Result Value Ref Range   WBC 15.0 (H) 4.0 - 10.5 K/uL   RBC 3.81 (L) 3.87 - 5.11 MIL/uL   Hemoglobin 10.5 (L) 12.0 - 15.0 g/dL   HCT 33.5 (L) 36.0 - 46.0 %   MCV 87.9 78.0 - 100.0 fL   MCH 27.6 26.0 - 34.0 pg   MCHC 31.3 30.0 - 36.0 g/dL   RDW 14.8 11.5 - 15.5 %   Platelets 318 150 - 400 K/uL  Comprehensive metabolic panel     Status: Abnormal   Collection Time: 10/06/16  4:17 AM  Result Value Ref  Range   Sodium 139 135 - 145 mmol/L   Potassium 3.7 3.5 - 5.1 mmol/L   Chloride 106 101 - 111 mmol/L   CO2 27 22 - 32 mmol/L   Glucose, Bld 174 (H) 65 - 99 mg/dL   BUN 13 6 - 20 mg/dL   Creatinine, Ser 0.50 0.44 - 1.00 mg/dL   Calcium 9.0 8.9 - 10.3 mg/dL   Total Protein 6.0 (L) 6.5 - 8.1 g/dL   Albumin 2.3 (L) 3.5 - 5.0 g/dL   AST 11 (L) 15 - 41 U/L   ALT 12 (L) 14 - 54 U/L   Alkaline Phosphatase 105 38 - 126 U/L   Total Bilirubin 0.4 0.3 - 1.2 mg/dL   GFR calc non Af Amer >60 >60 mL/min   GFR calc Af Amer >60 >60 mL/min    Comment: (NOTE) The eGFR has been calculated using the CKD EPI equation. This calculation has not been validated in all clinical situations. eGFR's persistently <60 mL/min signify possible Chronic Kidney Disease.    Anion  gap 6 5 - 15  Basic metabolic panel     Status: Abnormal   Collection Time: 10/07/16  4:38 AM  Result Value Ref Range   Sodium 139 135 - 145 mmol/L   Potassium 4.3 3.5 - 5.1 mmol/L   Chloride 104 101 - 111 mmol/L   CO2 30 22 - 32 mmol/L   Glucose, Bld 146 (H) 65 - 99 mg/dL   BUN 19 6 - 20 mg/dL   Creatinine, Ser 0.61 0.44 - 1.00 mg/dL   Calcium 9.0 8.9 - 10.3 mg/dL   GFR calc non Af Amer >60 >60 mL/min   GFR calc Af Amer >60 >60 mL/min    Comment: (NOTE) The eGFR has been calculated using the CKD EPI equation. This calculation has not been validated in all clinical situations. eGFR's persistently <60 mL/min signify possible Chronic Kidney Disease.    Anion gap 5 5 - 15    ABGS  Recent Labs  10/04/16 2322  PHART 7.374  PO2ART 77.9*  HCO3 22.8   CULTURES Recent Results (from the past 240 hour(s))  Urine culture     Status: Abnormal   Collection Time: 10/04/16  7:28 PM  Result Value Ref Range Status   Specimen Description URINE, CLEAN CATCH  Final   Special Requests NONE  Final   Culture MULTIPLE SPECIES PRESENT, SUGGEST RECOLLECTION (A)  Final   Report Status 10/06/2016 FINAL  Final  Blood Culture (routine x 2)     Status: None (Preliminary result)   Collection Time: 10/04/16  8:40 PM  Result Value Ref Range Status   Specimen Description LEFT ANTECUBITAL  Final   Special Requests BOTTLES DRAWN AEROBIC AND ANAEROBIC 6CC  Final   Culture NO GROWTH 2 DAYS  Final   Report Status PENDING  Incomplete  Blood Culture (routine x 2)     Status: None (Preliminary result)   Collection Time: 10/04/16  8:44 PM  Result Value Ref Range Status   Specimen Description BLOOD LEFT HAND  Final   Special Requests BOTTLES DRAWN AEROBIC AND ANAEROBIC 6CC  Final   Culture NO GROWTH 2 DAYS  Final   Report Status PENDING  Incomplete  MRSA PCR Screening     Status: None   Collection Time: 10/04/16 11:20 PM  Result Value Ref Range Status   MRSA by PCR NEGATIVE NEGATIVE Final    Comment:         The GeneXpert MRSA  Assay (FDA approved for NASAL specimens only), is one component of a comprehensive MRSA colonization surveillance program. It is not intended to diagnose MRSA infection nor to guide or monitor treatment for MRSA infections.   Culture, sputum-assessment     Status: None   Collection Time: 10/05/16  9:00 PM  Result Value Ref Range Status   Specimen Description SPUTUM  Final   Special Requests Normal  Final   Sputum evaluation   Final    THIS SPECIMEN IS ACCEPTABLE. RESPIRATORY CULTURE REPORT TO FOLLOW. PERFORMED AT [facility]    Report Status 10/05/2016 FINAL  Final  Culture, respiratory (NON-Expectorated)     Status: None (Preliminary result)   Collection Time: 10/05/16  9:00 PM  Result Value Ref Range Status   Specimen Description SPUTUM  Final   Special Requests NONE  Final   Gram Stain   Final    NO WBC SEEN FEW YEAST RARE GRAM POSITIVE RODS Performed at [facility]    Culture PENDING  Incomplete   Report Status PENDING  Incomplete   Studies/Results: No results found.  Medications:  Prior to Admission:  Prescriptions Prior to Admission  Medication Sig Dispense Refill Last Dose  . aspirin EC 81 MG tablet Take 81 mg by mouth daily.   Past Week at Unknown time  . clonazePAM (KLONOPIN) 0.5 MG tablet Take 0.125 mg by mouth daily as needed (FOR BP LEVELS).   10/03/2016 at Unknown time  . furosemide (LASIX) 40 MG tablet Take 0.5 tablets (20 mg total) by mouth daily. (Patient taking differently: Take 20 mg by mouth 3 (three) times a week. ) 30 tablet  Past Week at Unknown time  . HYDROcodone-acetaminophen (NORCO/VICODIN) 5-325 MG tablet Take 0.5-1 tablets by mouth daily as needed for moderate pain.   Past Week at Unknown time  . ipratropium-albuterol (DUONEB) 0.5-2.5 (3) MG/3ML SOLN Take 3 mLs by nebulization every 4 (four) hours as needed. 360 mL  10/04/2016 at Unknown time  . polyethylene glycol (MIRALAX / GLYCOLAX) packet Take 17 g by mouth 2 (two)  times daily. (Patient taking differently: Take 17 g by mouth daily as needed for mild constipation. ) 14 each 0 10/04/2016 at Unknown time  . potassium chloride SA (K-DUR,KLOR-CON) 20 MEQ tablet Take 20 mEq by mouth 3 (three) times a week. Takes only when Furosemide is taken   Past Week at Unknown time  . PROAIR HFA 108 (90 Base) MCG/ACT inhaler Inhale 1-2 puffs into the lungs every 6 (six) hours as needed for shortness of breath.    unknown   Scheduled: . aspirin EC  81 mg Oral Daily  . azithromycin  500 mg Intravenous Q24H  . cefTRIAXone (ROCEPHIN)  IV  1 g Intravenous Q24H  . enoxaparin (LOVENOX) injection  40 mg Subcutaneous Q24H  . feeding supplement (ENSURE ENLIVE)  237 mL Oral BID BM  . furosemide  10 mg Oral Daily  . guaiFENesin  1,200 mg Oral BID  . ipratropium-albuterol  3 mL Nebulization TID  . lisinopril  5 mg Oral Daily  . mouth rinse  15 mL Mouth Rinse BID  . methylPREDNISolone (SOLU-MEDROL) injection  40 mg Intravenous Q12H  . potassium chloride SA  20 mEq Oral Once per day on Mon Wed Fri   Continuous:  [id number], clonazePAM, HYDROcodone-acetaminophen, polyethylene glycol  Assesment: She was admitted with acute hypoxic respiratory failure. She has COPD at baseline. She has community-acquired pneumonia. She has malnutrition. She was septic on admission and that's better. She's having  more trouble breathing today. Her echocardiogram showed ejection fraction was 35-40% with diffuse hypokinesis. Her troponin level has been elevated. Principal Problem:   Acute respiratory failure with hypoxia (HCC) Active Problems:   CHF (congestive heart failure) (HCC)   COPD (chronic obstructive pulmonary disease) (HCC)   CAP (community acquired pneumonia)   Sepsis ([place])   Elevated troponin   Hypokalemia   Moderate malnutrition ([place])    Plan: Since she's worse I'm going to have her go ahead and get another chest x-ray today. Concerns would be if she has developed acute on chronic  combined heart failure worsening of pneumonia or worsening of COPD. Continue current treatments    LOS: 3 days   Hoover Grewe L 10/07/2016, 7:30 AM

## 2016-10-07 NOTE — Progress Notes (Signed)
Cardiology requested echo from [facility] that patient had during previous admission there. Fax sent to [facility] and echo results placed in chart.

## 2016-10-07 NOTE — Evaluation (Signed)
Physical Therapy Evaluation Patient Details Name: Hannah Mckee MRN: [id number] DOB: 09-Oct-1931 Today's Date: 10/07/2016   History of Present Illness  82 y.o. female with medical history significant of femur fracture; HTN; COPD (not on home O2); CHF (no echo available) presenting with PNA.  Patient got sick 2 days after taking a flu shot (about 11/10).  Does lots of cooking, baking, making candy, daughter thought she had worn herself out.  The last week, more flu-like, has never felt so bad in her whole life.  Coughing up bloody mucus.   Dx: CAP, sepsis, and Acute systolic heart failure  Clinical Impression  Pt received sitting up in the chair, and was agreeable to PT evaluation.  Pt states that "today is not a good day.  I felt better yesterday."  She states that she normally uses the furniture to stabilize her when she is walking in the house, and she uses a rollator when ambulating outside.  She is independent with ADL's, however, her dtr's assist her with grocery shopping and running errands.  During PT evaluation today she requested to have a breathing treatment prior to evaluation, therefore, RN was informed, and respiratory came to deliver her breathing treatment.  Pt required Min guard for sit<>stand, and was able to ambulate 27ft with RW and 2L of O2.  Further gait deferred due to pt expressing that she felt shaky, and HR had increased from 109bpm at rest to 127bpm during ambulation.  Pt will benefit from HHPT upon d/c to ensure that she continues to build her strength and endurance once she is discharged.      Follow Up Recommendations Home health PT;Supervision - Intermittent    Equipment Recommendations  None recommended by PT    Recommendations for Other Services       Precautions / Restrictions Precautions Precautions: Fall Precaution Comments: Due to recent illness, and immobility Restrictions Weight Bearing Restrictions: No      Mobility  Bed Mobility Overal bed  mobility:  (Not assessed - pt already sitting up in the chair. )                Transfers Overall transfer level: Needs assistance Equipment used: Rolling walker (2 wheeled) Transfers: Sit to/from Stand Sit to Stand: Min guard            Ambulation/Gait Ambulation/Gait assistance: Min assist Ambulation Distance (Feet): 20 Feet Assistive device: Rolling walker (2 wheeled) Gait Pattern/deviations: Step-through pattern;Trunk flexed   Gait velocity interpretation: <1.8 ft/sec, indicative of risk for recurrent falls General Gait Details: Pt required 1 standing rest break.  Pt expressed feeling shaky, however just had a breathing treatment.    Stairs            Wheelchair Mobility    Modified Rankin (Stroke Patients Only)       Balance Overall balance assessment: Needs assistance Sitting-balance support: Bilateral upper extremity supported;Feet supported Sitting balance-Leahy Scale: Good     Standing balance support: Bilateral upper extremity supported Standing balance-Leahy Scale: Poor                               Pertinent Vitals/Pain Pain Assessment: No/denies pain    Home Living   Living Arrangements: Alone Available Help at Discharge: Family (2 dtr's assist her all the time.  Call in to check on her on daily basis. ) Type of Home: [place]: One level  Home Equipment: Tub bench;Bedside commode;Walker - 4 wheels;Wheelchair - [employer] - single point Engineer, civil (consulting))      Prior Production designer, theatre/television/film / Transfers Assistance Needed: Pt uses rollator when outside the house.  Pt is a furniture walker at home.    ADL's / Homemaking Assistance Needed: Dtr does grocery shopping.  Pt normally makes her own meals.  independent with dressing and bathing.          Hand Dominance   Dominant Hand: Right    Extremity/Trunk Assessment   Upper Extremity Assessment: Generalized weakness           Lower Extremity Assessment:  Generalized weakness         Communication   Communication: HOH  Cognition Arousal/Alertness: Awake/alert Behavior During Therapy: WFL for tasks assessed/performed Overall Cognitive Status: Within Functional Limits for tasks assessed                      General Comments      Exercises     Assessment/Plan    PT Assessment Patient needs continued PT services  PT Problem List Decreased strength;Decreased activity tolerance;Decreased balance;Decreased mobility;Decreased knowledge of use of DME;Decreased safety awareness;Decreased knowledge of precautions;Cardiopulmonary status limiting activity          PT Treatment Interventions DME instruction;Gait training;Functional mobility training;Therapeutic activities;Therapeutic exercise;Balance training;Patient/family education    PT Goals (Current goals can be found in the Care Plan section)  Acute Rehab PT Goals Patient Stated Goal: Pt wants to go home.  PT Goal Formulation: With patient Time For Goal Achievement: 10/14/16 Potential to Achieve Goals: Fair    Frequency Min 3X/week   Barriers to discharge Decreased caregiver support Pt lives alone    Co-evaluation               End of Session Equipment Utilized During Treatment: Gait belt;Oxygen Activity Tolerance: Patient limited by fatigue Patient left: in chair;with call bell/phone within reach Nurse Communication: Mobility status Evelena Peat, and Mysti, RN's notified of pt's mobility status, as well as location.  Mobility sheet placed in pt's room. )    Functional Assessment Tool Used: [place] "6-clicks"  Functional Limitation: Mobility: Walking and moving around Mobility: Walking and Moving Around Current Status [phone]): At least 20 percent but less than 40 percent impaired, limited or restricted Mobility: Walking and Moving Around Goal Status [phone]): At least 1 percent but less than 20 percent impaired, limited or restricted    Time:  1411-1434 PT Time Calculation (min) (ACUTE ONLY): 23 min   Charges:   PT Evaluation $PT Eval Low Complexity: 1 Procedure PT Treatments $Gait Training: 8-22 mins   PT G Codes:   PT G-Codes **NOT FOR INPATIENT CLASS** Functional Assessment Tool Used: [employer] "6-clicks"  Functional Limitation: Mobility: Walking and moving around Mobility: Walking and Moving Around Current Status [phone]): At least 20 percent but less than 40 percent impaired, limited or restricted Mobility: Walking and Moving Around Goal Status [phone]): At least 1 percent but less than 20 percent impaired, limited or restricted    Beth Yoko Mcgahee, PT, DPT X: [phone]

## 2016-10-07 NOTE — Consult Note (Signed)
Primary cardiologist: N/A Consulting cardiologist: Dr Carlyle Dolly Requesting physician: Dr Erlinda Hong Indication: acute systolic heart failure  Clinical Summary Hannah Mckee is a 82 y.o.female history of HTN, COPD, admitted with SOB. Diagnosed with CAP and sepsis, managed by pulmonary and primary team. In setting of her SOB an echo was ordered that showed LVEF 35-40%, the chronicity of her systolic dysfunction is unclear at this time. Cardiology is consulted to assist with management. She reports she thinks she was told she had heart failure during a prior admission to [facility], but is unsure of any details.    K 4.3, Cr 0.61, BUN 19, WBC 15, Hgb 10.5, Plt 318. TSH pending Trop 0.07 peak, trending down 10/2016 echo: LVEF 35-40%, diffuse hypokinesis, severe hypokinesis basalinferior wall. Grade II diastolic dysfunction, mild to mod MR, severe LAE, PASP 50 EKG SR, LVH with strain CXR pneumonia, pulm edema   Allergies  Allergen Reactions  . Erythromycin Swelling  . Levaquin [Levofloxacin] Other (See Comments)    Tongue turns red and sore  . Penicillins Swelling    Has patient had a PCN reaction causing immediate rash, facial/tongue/throat swelling, SOB or lightheadedness with hypotension: Yes Has patient had a PCN reaction causing severe rash involving mucus membranes or skin necrosis: No Has patient had a PCN reaction that required hospitalization No Has patient had a PCN reaction occurring within the last 10 years: No If all of the above answers are "NO", then may proceed with Cephalosporin use.   . Sulfa Antibiotics Other (See Comments)    Tongue turns red and sore    Medications Scheduled Medications: . aspirin EC  81 mg Oral Daily  . azithromycin  500 mg Intravenous Q24H  . cefTRIAXone (ROCEPHIN)  IV  1 g Intravenous Q24H  . enoxaparin (LOVENOX) injection  40 mg Subcutaneous Q24H  . feeding supplement (ENSURE ENLIVE)  237 mL Oral BID BM  . furosemide  40 mg  Intravenous Daily  . guaiFENesin  1,200 mg Oral BID  . ipratropium-albuterol  3 mL Nebulization TID  . lisinopril  5 mg Oral Daily  . mouth rinse  15 mL Mouth Rinse BID  . methylPREDNISolone (SOLU-MEDROL) injection  40 mg Intravenous Q12H  . potassium chloride SA  20 mEq Oral Once per day on Mon Wed Fri     Infusions:   PRN Medications:  albuterol, clonazePAM, HYDROcodone-acetaminophen, polyethylene glycol   Past Medical History:  Diagnosis Date  . CHF (congestive heart failure) ([place])   . COPD (chronic obstructive pulmonary disease) (HCC)    no home O2  . Hypertension   . Kidney stones   . Osteoarthritis   . Sepsis [facility])     Past Surgical History:  Procedure Laterality Date  . bilateral cataract surg    . BUNIONECTOMY    . CHOLECYSTECTOMY    . COLONOSCOPY  11/16/2012   Procedure: COLONOSCOPY;  Surgeon: Rogene Houston, MD;  Location[facility];  Service: Endoscopy;  Laterality: N/A;  100  . LITHOTRIPSY    . ORIF PERIPROSTHETIC FRACTURE Right 10/29/2014   Procedure: OPEN REDUCTION INTERNAL FIXATION (ORIF) PERIPROSTHETIC FEMUR FRACTURE/FEMUR SHAFT;  Surgeon: Mcarthur Rossetti, MD;  Location: [place];  Service: Orthopedics;  Laterality: Right;  . TONSILLECTOMY    . TOTAL HIP ARTHROPLASTY     1989    History reviewed. No pertinent family history.  Social History [name] reports that she has never smoked. She has never used smokeless tobacco. Ms. Szymkowiak reports that she  does not drink alcohol.  Review of Systems CONSTITUTIONAL: No weight loss, fever, chills, weakness or fatigue.  HEENT: Eyes: No visual loss, blurred vision, double vision or yellow sclerae. No hearing loss, sneezing, congestion, runny nose or sore throat.  SKIN: No rash or itching.  CARDIOVASCULAR: No chest pain, chest pressure or chest discomfort. No palpitations or edema.  RESPIRATORY: +SOB GASTROINTESTINAL: No anorexia, nausea, vomiting or diarrhea. No abdominal pain or blood.    GENITOURINARY: no polyuria, no dysuria NEUROLOGICAL: +dizziness MUSCULOSKELETAL: No muscle, back pain, joint pain or stiffness.  HEMATOLOGIC: No anemia, bleeding or bruising.  LYMPHATICS: No enlarged nodes. No history of splenectomy.  PSYCHIATRIC: No history of depression or anxiety.      Physical Examination Blood pressure (!) 148/72, pulse (!) 102, temperature 98 F (36.7 C), temperature source Oral, resp. rate 17, height 4\' 7"  (1.397 m), weight 109 lb 2 oz (49.5 kg), SpO2 98 %.  Intake/Output Summary (Last 24 hours) at 10/07/16 1404 Last data filed at 10/07/16 0830  Gross per 24 hour  Intake             1230 ml  Output             1301 ml  Net              -71 ml    HEENT: sclera clear, throat clear  Cardiovascular: RRR, 2/6 systolic murmur at apex, elevated JVD  Respiratory: mild crackles bilateral bases  GI: abdomen soft, NT, ND  MSK: no LE edema  Neuro: no focal deficits  Psych: appropriate affect   Lab Results  Basic Metabolic Panel:  Recent Labs Lab 10/04/16 1725 10/05/16 0458 10/06/16 0417 10/07/16 0438  NA 135 135 139 139  K 3.3* 3.5 3.7 4.3  CL 99* 104 106 104  CO2 26 26 27 30   GLUCOSE 97 121* 174* 146*  BUN 11 9 13 19   CREATININE 0.69 0.49 0.50 0.61  CALCIUM 9.3 8.6* 9.0 9.0    Liver Function Tests:  Recent Labs Lab 10/04/16 1725 10/06/16 0417  AST 17 11*  ALT 11* 12*  ALKPHOS 126 105  BILITOT 0.8 0.4  PROT 7.6 6.0*  ALBUMIN 3.2* 2.3*    CBC:  Recent Labs Lab 10/04/16 1725 10/05/16 0458 10/06/16 0417  WBC 19.7* 21.7* 15.0*  NEUTROABS 17.1* 18.9*  --   HGB 12.2 10.7* 10.5*  HCT 38.0 34.0* 33.5*  MCV 88.2 87.9 87.9  PLT 368 326 318    Cardiac Enzymes:  Recent Labs Lab 10/04/16 1725 10/04/16 2040 10/04/16 2328 10/05/16 0458 10/05/16 1034  TROPONINI 0.04* 0.04* 0.05* 0.07* 0.06*    BNP: Invalid input(s): POCBNP     Impression/Recommendations 1. CAP/Sepsis - management per primary team  2. Acute  systolic heart failure - presumed acute systolic dysfunction, there is some mention about a possible prior history of CHF however details are unclear - CXR with some pulm congestion, BNP elevated - medical therapy with lisionpril 5mg  daily. We will start low dose Toprol XL 12.5mg  daily. Agree with IV lasix today - unclear etiology of systolic dysfunction at this time. Given her presentation with sepsis could be stress induced CM. Must always consider ICM as well. No urgent indication for ischemic testing at this time, can be considered at outpatient follow up pending response to initial medical therapy and after her sepsis/pneumonia have resolved.   3. Elevated troponin - peak 0.07 and trending down in setting of pneumonia and sepsis. Suspect demand ischemia - would not  pursue ischemic testing at this time.     Carlyle Dolly, M.D.

## 2016-10-08 DIAGNOSIS — E440 Moderate protein-calorie malnutrition: Secondary | ICD-10-CM

## 2016-10-08 DIAGNOSIS — M6281 Muscle weakness (generalized): Secondary | ICD-10-CM

## 2016-10-08 DIAGNOSIS — J181 Lobar pneumonia, unspecified organism: Secondary | ICD-10-CM

## 2016-10-08 DIAGNOSIS — R000 Tachycardia, unspecified: Secondary | ICD-10-CM

## 2016-10-08 DIAGNOSIS — I5021 Acute systolic (congestive) heart failure: Secondary | ICD-10-CM | POA: Diagnosis present

## 2016-10-08 LAB — CBC
HCT: 35.8 % — ABNORMAL LOW (ref 36.0–46.0)
Hemoglobin: 11.2 g/dL — ABNORMAL LOW (ref 12.0–15.0)
MCH: 28 pg (ref 26.0–34.0)
MCHC: 31.3 g/dL (ref 30.0–36.0)
MCV: 89.5 fL (ref 78.0–100.0)
PLATELETS: 391 10*3/uL (ref 150–400)
RBC: 4 MIL/uL (ref 3.87–5.11)
RDW: 14.7 % (ref 11.5–15.5)
WBC: 14.1 10*3/uL — AB (ref 4.0–10.5)

## 2016-10-08 LAB — BASIC METABOLIC PANEL
ANION GAP: 6 (ref 5–15)
BUN: 26 mg/dL — ABNORMAL HIGH (ref 6–20)
CALCIUM: 9.1 mg/dL (ref 8.9–10.3)
CO2: 34 mmol/L — ABNORMAL HIGH (ref 22–32)
Chloride: 98 mmol/L — ABNORMAL LOW (ref 101–111)
Creatinine, Ser: 0.69 mg/dL (ref 0.44–1.00)
GFR calc Af Amer: >60 mL/min (ref >60)
GLUCOSE: 169 mg/dL — AB (ref 65–99)
Potassium: 4 mmol/L (ref 3.5–5.1)
SODIUM: 138 mmol/L (ref 135–145)

## 2016-10-08 LAB — TSH: TSH: 0.047 u[IU]/mL — ABNORMAL LOW (ref 0.350–4.500)

## 2016-10-08 LAB — MAGNESIUM: MAGNESIUM: 2 mg/dL (ref 1.7–2.4)

## 2016-10-08 MED ORDER — LEVALBUTEROL HCL 0.63 MG/3ML IN NEBU
0.6300 mg | INHALATION_SOLUTION | Freq: Three times a day (TID) | RESPIRATORY_TRACT | Status: DC
  Administered 2016-10-08 (×2): 0.63 mg via RESPIRATORY_TRACT
  Filled 2016-10-08 (×2): qty 3

## 2016-10-08 MED ORDER — PREDNISONE 20 MG PO TABS
40.0000 mg | ORAL_TABLET | Freq: Every day | ORAL | Status: DC
  Administered 2016-10-08 – 2016-10-10 (×3): 40 mg via ORAL
  Filled 2016-10-08 (×3): qty 2

## 2016-10-08 MED ORDER — IPRATROPIUM BROMIDE 0.02 % IN SOLN
0.5000 mg | Freq: Three times a day (TID) | RESPIRATORY_TRACT | Status: DC
  Administered 2016-10-08 (×2): 0.5 mg via RESPIRATORY_TRACT
  Filled 2016-10-08 (×2): qty 2.5

## 2016-10-08 MED ORDER — LISINOPRIL 5 MG PO TABS
2.5000 mg | ORAL_TABLET | Freq: Every day | ORAL | Status: DC
  Administered 2016-10-08 – 2016-10-09 (×2): 2.5 mg via ORAL
  Administered 2016-10-10: 5 mg via ORAL
  Administered 2016-10-11: 2.5 mg via ORAL
  Filled 2016-10-08 (×4): qty 1

## 2016-10-08 MED ORDER — DEXTROSE 5 % IV SOLN
INTRAVENOUS | Status: AC
  Filled 2016-10-08: qty 10

## 2016-10-08 MED ORDER — IPRATROPIUM BROMIDE 0.02 % IN SOLN: 0.5000 mg | Freq: Three times a day (TID) | RESPIRATORY_TRACT | Status: DC

## 2016-10-08 MED ORDER — PREDNISONE 20 MG PO TABS: 40.0000 mg | ORAL_TABLET | Freq: Every day | ORAL | Status: DC

## 2016-10-08 MED ORDER — METOPROLOL SUCCINATE ER 25 MG PO TB24
25.0000 mg | ORAL_TABLET | Freq: Every day | ORAL | Status: DC
  Administered 2016-10-08 – 2016-10-10 (×3): 25 mg via ORAL
  Filled 2016-10-08 (×3): qty 1

## 2016-10-08 NOTE — Progress Notes (Signed)
Subjective: She says she feels better. No new complaints. Her breathing is better. She had some trouble last night but is much more comfortable this morning. She is coughing some. She's been able to bring up some sputum. No nausea vomiting diarrhea.  Objective: Vital signs in last 24 hours: Temp:  [97.8 F (36.6 C)-98.5 F (36.9 C)] 98.5 F (36.9 C) (12/08 0400) Pulse Rate:  [89-127] 93 (12/08 0400) Resp:  [15-25] 22 (12/08 0400) BP: (119-158)/(48-84) 119/48 (12/08 0400) SpO2:  [95 %-100 %] 95 % (12/08 0400) Weight:  [50.4 kg (111 lb 1.8 oz)] 50.4 kg (111 lb 1.8 oz) (12/08 0500) Weight change: 0.9 kg (1 lb 15.8 oz) Last BM Date: 10/06/16  Intake/Output from previous day: 12/07 0701 - 12/08 0700 In: 900 [P.O.:600; IV Piggyback:300] Out: 2475 [Urine:2475]  PHYSICAL EXAM General appearance: alert, cooperative and mild distress Resp: rhonchi bilaterally Cardio: regular rate and rhythm, S1, S2 normal, no murmur, click, rub or gallop GI: soft, non-tender; bowel sounds normal; no masses,  no organomegaly Extremities: extremities normal, atraumatic, no cyanosis or edema Mucous membranes are moist. Pupils react. Skin warm and dry  Lab Results:  Results for orders placed or performed during the hospital encounter of 10/04/16 (from the past 48 hour(s))  Basic metabolic panel     Status: Abnormal   Collection Time: 10/07/16  4:38 AM  Result Value Ref Range   Sodium 139 135 - 145 mmol/L   Potassium 4.3 3.5 - 5.1 mmol/L   Chloride 104 101 - 111 mmol/L   CO2 30 22 - 32 mmol/L   Glucose, Bld 146 (H) 65 - 99 mg/dL   BUN 19 6 - 20 mg/dL   Creatinine, Ser 0.61 0.44 - 1.00 mg/dL   Calcium 9.0 8.9 - 10.3 mg/dL   GFR calc non Af Amer >60 >60 mL/min   GFR calc Af Amer >60 >60 mL/min    Comment: (NOTE) The eGFR has been calculated using the CKD EPI equation. This calculation has not been validated in all clinical situations. eGFR's persistently <60 mL/min signify possible Chronic  Kidney Disease.    Anion gap 5 5 - 15  CBC     Status: Abnormal   Collection Time: 10/08/16  4:54 AM  Result Value Ref Range   WBC 14.1 (H) 4.0 - 10.5 K/uL   RBC 4.00 3.87 - 5.11 MIL/uL   Hemoglobin 11.2 (L) 12.0 - 15.0 g/dL   HCT 35.8 (L) 36.0 - 46.0 %   MCV 89.5 78.0 - 100.0 fL   MCH 28.0 26.0 - 34.0 pg   MCHC 31.3 30.0 - 36.0 g/dL   RDW 14.7 11.5 - 15.5 %   Platelets 391 150 - 400 K/uL  Basic metabolic panel     Status: Abnormal   Collection Time: 10/08/16  4:54 AM  Result Value Ref Range   Sodium 138 135 - 145 mmol/L   Potassium 4.0 3.5 - 5.1 mmol/L   Chloride 98 (L) 101 - 111 mmol/L   CO2 34 (H) 22 - 32 mmol/L   Glucose, Bld 169 (H) 65 - 99 mg/dL   BUN 26 (H) 6 - 20 mg/dL   Creatinine, Ser 0.69 0.44 - 1.00 mg/dL   Calcium 9.1 8.9 - 10.3 mg/dL   GFR calc non Af Amer >60 >60 mL/min   GFR calc Af Amer >60 >60 mL/min    Comment: (NOTE) The eGFR has been calculated using the CKD EPI equation. This calculation has not been validated in all clinical  situations. eGFR's persistently <60 mL/min signify possible Chronic Kidney Disease.    Anion gap 6 5 - 15  Magnesium     Status: None   Collection Time: 10/08/16  4:54 AM  Result Value Ref Range   Magnesium 2.0 1.7 - 2.4 mg/dL  TSH     Status: Abnormal   Collection Time: 10/08/16  4:54 AM  Result Value Ref Range   TSH 0.047 (L) 0.350 - 4.500 uIU/mL    Comment: Performed by a 3rd Generation assay with a functional sensitivity of <=0.01 uIU/mL.    ABGS No results for input(s): PHART, PO2ART, TCO2, HCO3 in the last 72 hours.  Invalid input(s): PCO2 CULTURES Recent Results (from the past 240 hour(s))  Urine culture     Status: Abnormal   Collection Time: 10/04/16  7:28 PM  Result Value Ref Range Status   Specimen Description URINE, CLEAN CATCH  Final   Special Requests NONE  Final   Culture MULTIPLE SPECIES PRESENT, SUGGEST RECOLLECTION (A)  Final   Report Status 10/06/2016 FINAL  Final  Blood Culture (routine x 2)      Status: None (Preliminary result)   Collection Time: 10/04/16  8:40 PM  Result Value Ref Range Status   Specimen Description LEFT ANTECUBITAL  Final   Special Requests BOTTLES DRAWN AEROBIC AND ANAEROBIC 6CC  Final   Culture NO GROWTH 2 DAYS  Final   Report Status PENDING  Incomplete  Blood Culture (routine x 2)     Status: None (Preliminary result)   Collection Time: 10/04/16  8:44 PM  Result Value Ref Range Status   Specimen Description BLOOD LEFT HAND  Final   Special Requests BOTTLES DRAWN AEROBIC AND ANAEROBIC 6CC  Final   Culture NO GROWTH 2 DAYS  Final   Report Status PENDING  Incomplete  MRSA PCR Screening     Status: None   Collection Time: 10/04/16 11:20 PM  Result Value Ref Range Status   MRSA by PCR NEGATIVE NEGATIVE Final    Comment:        The GeneXpert MRSA Assay (FDA approved for NASAL specimens only), is one component of a comprehensive MRSA colonization surveillance program. It is not intended to diagnose MRSA infection nor to guide or monitor treatment for MRSA infections.   Culture, sputum-assessment     Status: None   Collection Time: 10/05/16  9:00 PM  Result Value Ref Range Status   Specimen Description SPUTUM  Final   Special Requests Normal  Final   Sputum evaluation   Final    THIS SPECIMEN IS ACCEPTABLE. RESPIRATORY CULTURE REPORT TO FOLLOW. PERFORMED AT [facility]    Report Status 10/05/2016 FINAL  Final  Culture, respiratory (NON-Expectorated)     Status: None (Preliminary result)   Collection Time: 10/05/16  9:00 PM  Result Value Ref Range Status   Specimen Description SPUTUM  Final   Special Requests NONE  Final   Gram Stain NO WBC SEEN FEW YEAST RARE GRAM POSITIVE RODS   Final   Culture   Final    CULTURE REINCUBATED FOR BETTER GROWTH Performed at [facility]    Report Status PENDING  Incomplete   Studies/Results: Dg Chest Port 1 View  Result Date: 10/07/2016 CLINICAL DATA:  Pneumonia short of breath EXAM: PORTABLE CHEST 1  VIEW COMPARISON:  10/04/2016 FINDINGS: Progression of bibasilar airspace disease. Progression of pulmonary vascular congestion and bilateral effusions which may be due to heart failure. IMPRESSION: Progression of vascular congestion and bilateral airspace  disease most suggestive of pulmonary edema and bilateral effusions. Bibasilar airspace disease may be atelectasis or pneumonia. This has progressed in the interval. Electronically Signed   By: Franchot Gallo M.D.   On: 10/07/2016 09:04    Medications:  Prior to Admission:  Prescriptions Prior to Admission  Medication Sig Dispense Refill Last Dose  . aspirin EC 81 MG tablet Take 81 mg by mouth daily.   Past Week at Unknown time  . clonazePAM (KLONOPIN) 0.5 MG tablet Take 0.125 mg by mouth daily as needed (FOR BP LEVELS).   10/03/2016 at Unknown time  . furosemide (LASIX) 40 MG tablet Take 0.5 tablets (20 mg total) by mouth daily. (Patient taking differently: Take 20 mg by mouth 3 (three) times a week. ) 30 tablet  Past Week at Unknown time  . HYDROcodone-acetaminophen (NORCO/VICODIN) 5-325 MG tablet Take 0.5-1 tablets by mouth daily as needed for moderate pain.   Past Week at Unknown time  . ipratropium-albuterol (DUONEB) 0.5-2.5 (3) MG/3ML SOLN Take 3 mLs by nebulization every 4 (four) hours as needed. 360 mL  10/04/2016 at Unknown time  . polyethylene glycol (MIRALAX / GLYCOLAX) packet Take 17 g by mouth 2 (two) times daily. (Patient taking differently: Take 17 g by mouth daily as needed for mild constipation. ) 14 each 0 10/04/2016 at Unknown time  . potassium chloride SA (K-DUR,KLOR-CON) 20 MEQ tablet Take 20 mEq by mouth 3 (three) times a week. Takes only when Furosemide is taken   Past Week at Unknown time  . PROAIR HFA 108 (90 Base) MCG/ACT inhaler Inhale 1-2 puffs into the lungs every 6 (six) hours as needed for shortness of breath.    unknown   Scheduled: . aspirin EC  81 mg Oral Daily  . azithromycin  500 mg Intravenous Q24H  . cefTRIAXone  (ROCEPHIN)  IV  1 g Intravenous Q24H  . enoxaparin (LOVENOX) injection  40 mg Subcutaneous Q24H  . feeding supplement (ENSURE ENLIVE)  237 mL Oral BID BM  . furosemide  40 mg Intravenous Daily  . guaiFENesin  1,200 mg Oral BID  . ipratropium-albuterol  3 mL Nebulization TID  . lisinopril  5 mg Oral Daily  . mouth rinse  15 mL Mouth Rinse BID  . methylPREDNISolone (SOLU-MEDROL) injection  40 mg Intravenous Q12H  . metoprolol succinate  12.5 mg Oral Daily  . potassium chloride SA  20 mEq Oral Once per day on Mon Wed Fri   Continuous:  [id number], clonazePAM, HYDROcodone-acetaminophen, polyethylene glycol  Assesment: She was admitted with acute hypoxic respiratory failure with COPD exacerbation and community-acquired pneumonia and congestive heart failure. She was septic on admission but quickly resolved that. She has been somewhat slow to respond but looks much better this morning. She has malnutrition as well. Principal Problem:   Acute respiratory failure with hypoxia (HCC) Active Problems:   CHF (congestive heart failure) (HCC)   COPD (chronic obstructive pulmonary disease) (HCC)   CAP (community acquired pneumonia)   Sepsis ([place])   Elevated troponin   Hypokalemia   Moderate malnutrition (HCC)    Plan: Continue current medications and treatments.    LOS: 4 days   Irene Collings L 10/08/2016, 7:44 AM

## 2016-10-08 NOTE — Progress Notes (Signed)
Patient Name: Hannah Mckee Date of Encounter: 10/08/2016  Primary Cardiologist: Carlyle Dolly MD  Hospital Problem List     Principal Problem:   Acute respiratory failure with hypoxia [facility]) Active Problems:   CHF (congestive heart failure) (HCC)   COPD (chronic obstructive pulmonary disease) ([place])   CAP (community acquired pneumonia)   Sepsis ([place])   Elevated troponin   Hypokalemia   Moderate malnutrition (HCC)     Subjective  Feeling better, coughing up sputum. Breathing better.  Very talkative.   Inpatient Medications    Scheduled Meds: . aspirin EC  81 mg Oral Daily  . azithromycin  500 mg Intravenous Q24H  . cefTRIAXone (ROCEPHIN)  IV  1 g Intravenous Q24H  . enoxaparin (LOVENOX) injection  40 mg Subcutaneous Q24H  . feeding supplement (ENSURE ENLIVE)  237 mL Oral BID BM  . furosemide  40 mg Intravenous Daily  . guaiFENesin  1,200 mg Oral BID  . ipratropium-albuterol  3 mL Nebulization TID  . lisinopril  5 mg Oral Daily  . mouth rinse  15 mL Mouth Rinse BID  . methylPREDNISolone (SOLU-MEDROL) injection  40 mg Intravenous Q12H  . metoprolol succinate  12.5 mg Oral Daily  . potassium chloride SA  20 mEq Oral Once per day on Mon Wed Fri   Continuous Infusions:  PRN Meds: albuterol, clonazePAM, HYDROcodone-acetaminophen, polyethylene glycol   Vital Signs    Vitals:   10/08/16 0124 10/08/16 0400 10/08/16 0500 10/08/16 0744  BP:  (!) 119/48    Pulse:  93    Resp:  (!) 22    Temp:  98.5 F (36.9 C)    TempSrc:  Oral    SpO2: 99% 95%  100%  Weight:   111 lb 1.8 oz (50.4 kg)   Height:        Intake/Output Summary (Last 24 hours) at 10/08/16 0752 Last data filed at 10/08/16 0018  Gross per 24 hour  Intake              900 ml  Output             2475 ml  Net            -1575 ml   Filed Weights   10/06/16 0500 10/07/16 0500 10/08/16 0500  Weight: 108 lb 7.5 oz (49.2 kg) 109 lb 2 oz (49.5 kg) 111 lb 1.8 oz (50.4 kg)    Physical Exam    GEN:  Well nourished, thin, well developed, in no acute distress.  HEENT: Grossly normal.  Neck: Supple, no JVD, carotid bruits, or masses. Cardiac: RRR,tachycardic,  1/6 systolic murmur, murmurs, rubs, or gallops. No clubbing, cyanosis, edema.  Radials/DP/PT 2+ and equal bilaterally.  Respiratory:  Bilateral crackles in the bases, without wheezes.  GI: Soft, nontender, nondistended, BS + x 4. MS: no deformity or atrophy.Kyphosis in noted.  Skin: warm and dry, no rash. Neuro:  Strength and sensation are intact. Psych: AAOx3.  Normal affect.  Labs    CBC  Recent Labs  10/06/16 0417 10/08/16 0454  WBC 15.0* 14.1*  HGB 10.5* 11.2*  HCT 33.5* 35.8*  MCV 87.9 89.5  PLT 318 0000000   Basic Metabolic Panel  Recent Labs  10/07/16 0438 10/08/16 0454  NA 139 138  K 4.3 4.0  CL 104 98*  CO2 30 34*  GLUCOSE 146* 169*  BUN 19 26*  CREATININE 0.61 0.69  CALCIUM 9.0 9.1  MG  --  2.0   Liver Function  Tests  Recent Labs  10/06/16 0417  AST 11*  ALT 12*  ALKPHOS 105  BILITOT 0.4  PROT 6.0*  ALBUMIN 2.3*   Cardiac Enzymes  Recent Labs  10/05/16 1034  TROPONINI 0.06*   Thyroid Function Tests  Recent Labs  10/08/16 0454  TSH 0.047*    Telemetry    NSR in the 70's to 90's. One episode of SVT around 4 am.   ECG    Personally Reviewed  Radiology    Dg Chest Port 1 View  Result Date: 10/07/2016 CLINICAL DATA:  Pneumonia short of breath EXAM: PORTABLE CHEST 1 VIEW COMPARISON:  10/04/2016 FINDINGS: Progression of bibasilar airspace disease. Progression of pulmonary vascular congestion and bilateral effusions which may be due to heart failure. IMPRESSION: Progression of vascular congestion and bilateral airspace disease most suggestive of pulmonary edema and bilateral effusions. Bibasilar airspace disease may be atelectasis or pneumonia. This has progressed in the interval. Electronically Signed   By: Franchot Gallo M.D.   On: 10/07/2016 09:04    Cardiac Studies   Echocardiogram 10/06/2016   Left ventricle: The cavity size was normal. Wall thickness was   increased in a pattern of mild LVH. Systolic function was   moderately reduced. The estimated ejection fraction was in the   range of 35% to 40%. Diffuse hypokinesis. There is severe   hypokinesis of the basalinferior myocardium. Features are   consistent with a pseudonormal left ventricular filling pattern,   with concomitant abnormal relaxation and increased filling   pressure (grade 2 diastolic dysfunction). - Aortic valve: Moderately calcified annulus. Trileaflet. - Mitral valve: Calcified annulus. There was mild to moderate   regurgitation. - Left atrium: The atrium was severely dilated. - Right atrium: The atrium was mildly dilated. Central venous   pressure (est): 3 mm Hg. - Atrial septum: No defect or patent foramen ovale was identified. - Tricuspid valve: There was moderate regurgitation. - Pulmonary arteries: PA peak pressure: 50 mm Hg (S). - Pericardium, extracardiac: There was no pericardial effusion.   Patient Profile   82 y/o with hx of hypertension, COPD, admitted with CAP, sepsis, and found to have reduced EF of 35%-40%, Grade II diastolic dysfunction, mild to mod MR, severe LAE, with new diagnosis of  pulmonary edema and CHF wit demand ischemia.   Assessment & Plan    1.Acute on Chronic Systolic CHF: Has been on IV diuretics 40 mg IV daily. 1.5 liters urine output  overnight but is positive on I/O. No significant edema noted, although lungs remain congested. Likely combination of CAP and mild CHF at this point. Continue IV lasix today, and potentially transition to po over the weekend, 40 mg po daily. Creatinine 0.69, CO2 34.   2. Tachycardia: Episodes of PSVT early morning hours. Now in NSR with HR easily tachycardic with minimal exertion, I.e.repositioing in the bed or talking. Now on metoprolol 12.6 mg daily. BP is soft. Due to increased HR, will increased metoprolol to 25 mg  daily, and decrease lisinopril to 2.5 mg to avoid hypotension.   3. Systolic Dysfunction: EF of 35%-40% per echo this admission. Once stable from CAP and CHF, will consider ischemic testing. CVRF of Hypertension, Age, unknown FH. Demand ischemia is noted in the setting of hypoxia, sepsis, and pneumonia.     Signed, Jory Sims, NP  10/08/2016, 7:52 AM   Attending Note Patient seen and discussed with NP Purcell Nails, I agree with her documentation above. Records received from [facility]. Most recent echo 2013 showed normal  LVEF. Yesterday she thought she had an echo there that had prevously showed some systolic dysfunction. Once again the chronicity of her LV systolic dysfunction appears somewhat unclear. LVEF this admit 35-40%. She is negative 1.5 liters yesterday, + 949mL since admission. She is on lasix IV 40mg  daily, mild uptrend in renal function. Medical therapy for CHF with Toprol XL 51m and lisonirpil 2.5mg  daily. Still with some evidence of volume overload, contineu IV lasix today likely change to oral lasix tomorrow   Zandra Abts MD

## 2016-10-08 NOTE — Progress Notes (Signed)
PT TRANSFERRING TO TELEMETRY TO ROOM 327. ALERT AND ORIENTED. O2 AT 2L/MIN VIA [place]. NO RESPIRATORY DISTRESS. IV/NSL PATENT. REPORT CALLED TO CHRISTINA RN ON 300.Marland Kitchen

## 2016-10-08 NOTE — Care Management Important Message (Signed)
Important Message  Patient Details  Name: Hannah Mckee MRN: [id number] Date of Birth: [birth year]   Medicare Important Message Given:  Yes    Sherald Barge, RN 10/08/2016, 2:10 PM

## 2016-10-08 NOTE — Progress Notes (Signed)
PROGRESS NOTE                                                                                                                                                                                                             Patient Demographics:    Hannah Mckee, is a 82 y.o. female, DOB - Oct 24, 1931, [id number]  Admit date - 10/04/2016   Admitting Physician Karmen Bongo, MD  Outpatient Primary MD for the patient is Glenda Chroman, MD  LOS - 4  Outpatient Specialists: none  Chief Complaint  Patient presents with  . Shortness of Breath       Brief Narrative   82 year old female with hypertension, COPD not on home O2, history of CHF presented with flulike symptoms with productive cough and shortness of breath with associated orthopnea. Patient found to be in acute hypoxic respiratory failure secondary to lobar pneumonia and acute systolic CHF along with COPD exacerbation. Admitted to stepdown unit. Pulmonary and cardiology following.   Subjective:    reports breathing to be Much better. Episodes of heart rate in 140s early this morning. Remains afebrile.   Assessment  & Plan :    Principal Problem:   Acute respiratory failure with hypoxia (HCC) Combination of lobar pneumonia, acute systolic CHF and COPD exacerbation. Continue oxygen, nebs, antibiotics, diuretics and steroid as outlined below.  Active Problems: Lobar pneumonia ([place]) Symptoms improving. Continue empiric Rocephin and azithromycin. Supportive care with Tylenol and antitussives. Blood cultures negative. Flu PCR and strep urine pneumonia antigen have been negative.     Acute systolic CHF (congestive heart failure) (HCC) EF of 35-40%. Worsened symptoms yesterday with follow-up chest x-ray showing increased pulmonary vascular congestion. On IV Lasix daily, continue for today and transition to by mouth tomorrow. Strict I/O and daily weight. Added beta  blocker. Continue ACE inhibitor.    COPD (chronic obstructive pulmonary disease) ([place]) With acute exacerbation. On IV Solu-Medrol, transition to by mouth prednisone today. Continue scheduled DuoNeb (given sinus tachycardia will switch albuterol to Xopenex). Supportive care with antitussives. On empiric antibiotics.    Elevated troponin Possibly demand ischemia with acute symptoms. However given associated acute systolic dysfunction cardiology plan on stress testing to rule out ischemia once acute symptoms resolve.  Sinus tachycardia Noted for episodes of PSVT early this morning. Gets tachycardic on minimal exertion. Patient was started on  low-dose metoprolol which has been increased by cardiology today. Will switch Solu-Medrol to oral prednisone and also switch albuterol neb to Xopenex. Monitor on telemetry. Potassium and magnesium are normal.    Hypokalemia Replenish    Moderate protein calorie malnutrition (HCC) Added supplement.  Generalized weakness Seen by physical therapy and recommended home health with supervision. Patient lives in an apartment? Independent living in her daughter lives close by. Reports significant help from family. Daughter would like to take her home to stay with her for a few days. Need to arrange home health iron and PT upon discharge.     Code Status : Full code  Family Communication  : Daughter at bedside  Disposition Plan  : Home possibly in the next 48 hours if symptoms improving. Transfer to telemetry today.  Barriers For Discharge : Active symptoms  Consults  :   Cardiology Pulmonary  Procedures  :  2-D echo  DVT Prophylaxis  :  Lovenox -   Lab Results  Component Value Date   PLT 391 10/08/2016    Antibiotics  :    Anti-infectives    Start     Dose/Rate Route Frequency Ordered Stop   10/05/16 2345  vancomycin (VANCOCIN) IVPB 750 mg/150 ml premix  Status:  Discontinued     750 mg 150 mL/hr over 60 Minutes Intravenous Every 24 hours  10/04/16 2334 10/06/16 1012   10/05/16 2000  vancomycin (VANCOCIN) IVPB 750 mg/150 ml premix  Status:  Discontinued     750 mg 150 mL/hr over 60 Minutes Intravenous Every 24 hours 10/04/16 1943 10/04/16 2321   10/05/16 2000  cefTRIAXone (ROCEPHIN) 1 g in dextrose 5 % 50 mL IVPB     1 g 100 mL/hr over 30 Minutes Intravenous Every 24 hours 10/05/16 0933     10/05/16 0000  azithromycin (ZITHROMAX) 500 mg in dextrose 5 % 250 mL IVPB     500 mg 250 mL/hr over 60 Minutes Intravenous Every 24 hours 10/04/16 2321 10/11/16 2359   10/04/16 2345  vancomycin (VANCOCIN) IVPB 1000 mg/200 mL premix     1,000 mg 200 mL/hr over 60 Minutes Intravenous  Once 10/04/16 2334 10/05/16 0112   10/04/16 2200  ceFEPIme (MAXIPIME) 1 g in dextrose 5 % 50 mL IVPB  Status:  Discontinued     1 g 100 mL/hr over 30 Minutes Intravenous Every 12 hours 10/04/16 1928 10/04/16 1931   10/04/16 2000  doxycycline (VIBRAMYCIN) 100 mg in dextrose 5 % 250 mL IVPB  Status:  Discontinued     100 mg 125 mL/hr over 120 Minutes Intravenous Every 12 hours 10/04/16 1932 10/04/16 2321   10/04/16 2000  vancomycin (VANCOCIN) IVPB 1000 mg/200 mL premix  Status:  Discontinued     1,000 mg 200 mL/hr over 60 Minutes Intravenous  Once 10/04/16 1943 10/04/16 2321   10/04/16 1945  cefTRIAXone (ROCEPHIN) 1 g in dextrose 5 % 50 mL IVPB     1 g 100 mL/hr over 30 Minutes Intravenous  Once 10/04/16 1930 10/04/16 2040   10/04/16 1945  doxycycline (VIBRA-TABS) tablet 100 mg  Status:  Discontinued     100 mg Oral  Once 10/04/16 1930 10/04/16 1932   10/04/16 1930  vancomycin (VANCOCIN) IVPB 1000 mg/200 mL premix  Status:  Discontinued     1,000 mg 200 mL/hr over 60 Minutes Intravenous  Once 10/04/16 1928 10/04/16 1931        Objective:   Vitals:   10/08/16 0744 10/08/16 0800 10/08/16 1000  10/08/16 1133  BP:  (!) 145/70 (!) 157/70   Pulse:  97 (!) 106   Resp:  14 (!) 32   Temp:  97.9 F (36.6 C)    TempSrc:      SpO2: 100% 98% 96% 100%    Weight:      Height:        Wt Readings from Last 3 Encounters:  10/08/16 50.4 kg (111 lb 1.8 oz)  04/23/16 48.3 kg (106 lb 6.4 oz)  10/29/14 70.2 kg (154 lb 12.2 oz)     Intake/Output Summary (Last 24 hours) at 10/08/16 1223 Last data filed at 10/08/16 0900  Gross per 24 hour  Intake              900 ml  Output             2475 ml  Net            -1575 ml     Physical Exam  [id number] thin built female not in distress HEENT: moist mucosa, supple neck Chest: clear b/l, no added sounds CVS: S1 and S2 tachycardia, no murmurs rub or gallop GI: soft, NT, ND, BS+ Musculoskeletal: warm, trace edema CNS: Alert and oriented    Data Review:    CBC  Recent Labs Lab 10/04/16 1725 10/05/16 0458 10/06/16 0417 10/08/16 0454  WBC 19.7* 21.7* 15.0* 14.1*  HGB 12.2 10.7* 10.5* 11.2*  HCT 38.0 34.0* 33.5* 35.8*  PLT 368 326 318 391  MCV 88.2 87.9 87.9 89.5  MCH 28.3 27.6 27.6 28.0  MCHC 32.1 31.5 31.3 31.3  RDW 14.5 14.6 14.8 14.7  LYMPHSABS 0.9 0.7  --   --   MONOABS 1.5* 2.0*  --   --   EOSABS 0.1 0.0  --   --   BASOSABS 0.1 0.0  --   --     Chemistries   Recent Labs Lab 10/04/16 1725 10/05/16 0458 10/06/16 0417 10/07/16 0438 10/08/16 0454  NA 135 135 139 139 138  K 3.3* 3.5 3.7 4.3 4.0  CL 99* 104 106 104 98*  CO2 26 26 27 30  34*  GLUCOSE 97 121* 174* 146* 169*  BUN 11 9 13 19  26*  CREATININE 0.69 0.49 0.50 0.61 0.69  CALCIUM 9.3 8.6* 9.0 9.0 9.1  MG  --   --   --   --  2.0  AST 17  --  11*  --   --   ALT 11*  --  12*  --   --   ALKPHOS 126  --  105  --   --   BILITOT 0.8  --  0.4  --   --    ------------------------------------------------------------------------------------------------------------------ No results for input(s): CHOL, HDL, LDLCALC, TRIG, CHOLHDL, LDLDIRECT in the last 72 hours.  No results found for:  HGBA1C ------------------------------------------------------------------------------------------------------------------  Recent Labs  10/08/16 0454  TSH 0.047*   ------------------------------------------------------------------------------------------------------------------ No results for input(s): VITAMINB12, FOLATE, FERRITIN, TIBC, IRON, RETICCTPCT in the last 72 hours.  Coagulation profile No results for input(s): INR, PROTIME in the last 168 hours.  No results for input(s): DDIMER in the last 72 hours.  Cardiac Enzymes  Recent Labs Lab 10/04/16 2328 10/05/16 0458 10/05/16 1034  TROPONINI 0.05* 0.07* 0.06*   ------------------------------------------------------------------------------------------------------------------    Component Value Date/Time   BNP 459.0 (H) 10/04/2016 2044    Inpatient Medications  Scheduled Meds: . aspirin EC  81 mg Oral Daily  . azithromycin  500 mg Intravenous Q24H  .  cefTRIAXone (ROCEPHIN)  IV  1 g Intravenous Q24H  . enoxaparin (LOVENOX) injection  40 mg Subcutaneous Q24H  . feeding supplement (ENSURE ENLIVE)  237 mL Oral BID BM  . guaiFENesin  1,200 mg Oral BID  . ipratropium  0.5 mg Nebulization TID  . levalbuterol  0.63 mg Nebulization Q8H  . lisinopril  2.5 mg Oral Daily  . mouth rinse  15 mL Mouth Rinse BID  . metoprolol succinate  25 mg Oral Daily  . potassium chloride SA  20 mEq Oral Once per day on Mon Wed Fri  . predniSONE  40 mg Oral Q breakfast   Continuous Infusions: PRN Meds:.albuterol, clonazePAM, HYDROcodone-acetaminophen, polyethylene glycol  Micro Results Recent Results (from the past 240 hour(s))  Urine culture     Status: Abnormal   Collection Time: 10/04/16  7:28 PM  Result Value Ref Range Status   Specimen Description URINE, CLEAN CATCH  Final   Special Requests NONE  Final   Culture MULTIPLE SPECIES PRESENT, SUGGEST RECOLLECTION (A)  Final   Report Status 10/06/2016 FINAL  Final  Blood Culture  (routine x 2)     Status: None (Preliminary result)   Collection Time: 10/04/16  8:40 PM  Result Value Ref Range Status   Specimen Description LEFT ANTECUBITAL  Final   Special Requests BOTTLES DRAWN AEROBIC AND ANAEROBIC 6CC  Final   Culture NO GROWTH 4 DAYS  Final   Report Status PENDING  Incomplete  Blood Culture (routine x 2)     Status: None (Preliminary result)   Collection Time: 10/04/16  8:44 PM  Result Value Ref Range Status   Specimen Description BLOOD LEFT HAND  Final   Special Requests BOTTLES DRAWN AEROBIC AND ANAEROBIC 6CC  Final   Culture NO GROWTH 4 DAYS  Final   Report Status PENDING  Incomplete  MRSA PCR Screening     Status: None   Collection Time: 10/04/16 11:20 PM  Result Value Ref Range Status   MRSA by PCR NEGATIVE NEGATIVE Final    Comment:        The GeneXpert MRSA Assay (FDA approved for NASAL specimens only), is one component of a comprehensive MRSA colonization surveillance program. It is not intended to diagnose MRSA infection nor to guide or monitor treatment for MRSA infections.   Culture, sputum-assessment     Status: None   Collection Time: 10/05/16  9:00 PM  Result Value Ref Range Status   Specimen Description SPUTUM  Final   Special Requests Normal  Final   Sputum evaluation   Final    THIS SPECIMEN IS ACCEPTABLE. RESPIRATORY CULTURE REPORT TO FOLLOW. PERFORMED AT [facility]    Report Status 10/05/2016 FINAL  Final  Culture, respiratory (NON-Expectorated)     Status: None (Preliminary result)   Collection Time: 10/05/16  9:00 PM  Result Value Ref Range Status   Specimen Description SPUTUM  Final   Special Requests NONE  Final   Gram Stain NO WBC SEEN FEW YEAST RARE GRAM POSITIVE RODS   Final   Culture   Final    CULTURE REINCUBATED FOR BETTER GROWTH Performed at [facility]    Report Status PENDING  Incomplete    Radiology Reports Dg Chest 2 View  Result Date: 10/04/2016 CLINICAL DATA:  Shortness of breath . EXAM: CHEST  2  VIEW COMPARISON:  10/28/2014. FINDINGS: Mediastinum hilar structures normal. Cardiomegaly with mild bibasilar infiltrates small pleural effusions suggesting congestive heart failure. Low lung volumes. No pneumothorax. Thoracic spine kyphosis  and diffuse osteopenia. IMPRESSION: 1. Cardiomegaly. Mild mild basilar infiltrates and/or edema with small pleural effusions. Congestive heart failure cannot be excluded. 2. Low lung volumes. Electronically Signed   By: Marcello Moores  Register   On: 10/04/2016 16:00   Dg Chest Port 1 View  Result Date: 10/07/2016 CLINICAL DATA:  Pneumonia short of breath EXAM: PORTABLE CHEST 1 VIEW COMPARISON:  10/04/2016 FINDINGS: Progression of bibasilar airspace disease. Progression of pulmonary vascular congestion and bilateral effusions which may be due to heart failure. IMPRESSION: Progression of vascular congestion and bilateral airspace disease most suggestive of pulmonary edema and bilateral effusions. Bibasilar airspace disease may be atelectasis or pneumonia. This has progressed in the interval. Electronically Signed   By: Franchot Gallo M.D.   On: 10/07/2016 09:04    Time Spent in minutes  35   Louellen Molder M.D on 10/08/2016 at 12:23 PM  Between 7am to 7pm - Pager - [phone]  After 7pm go to [email or web address] - password [facility]  [facility] -  Office  [phone]

## 2016-10-08 NOTE — Care Management Note (Signed)
Case Management Note  Patient Details  Name: Hannah Mckee MRN: [id number] Date of Birth: 1931-08-06  Expected Discharge Date:      10/08/2016            Expected Discharge Plan:  [place]  In-House Referral:  NA  Discharge planning Services  CM Consult  Post Acute Care Choice:  Home Health Choice offered to:  Patient  HH Arranged:  RN, PT [facility] Agency:  [place]  Status of Service:  In process, will continue to follow  Additional Comments: PT has recommended HH PT. Pt is agreeable and would like to use [facility] as she has used them in the past. Pt's daughter at the bedside. Pt is aware that Hh has 48hrs to make first visit. Blake Divine, of [facility], is aware of referral and will obtain pt info from chart. If pt is DC'd home over weekend, RN will notify [place].   Sherald Barge, RN 10/08/2016, 2:04 PM

## 2016-10-09 LAB — CULTURE, RESPIRATORY: GRAM STAIN: NONE SEEN

## 2016-10-09 LAB — CULTURE, BLOOD (ROUTINE X 2)
Culture: NO GROWTH
Culture: NO GROWTH

## 2016-10-09 LAB — CULTURE, RESPIRATORY W GRAM STAIN

## 2016-10-09 MED ORDER — IPRATROPIUM BROMIDE 0.02 % IN SOLN
0.5000 mg | Freq: Three times a day (TID) | RESPIRATORY_TRACT | Status: DC
  Administered 2016-10-09 – 2016-10-11 (×7): 0.5 mg via RESPIRATORY_TRACT
  Filled 2016-10-09 (×7): qty 2.5

## 2016-10-09 MED ORDER — LEVALBUTEROL HCL 0.63 MG/3ML IN NEBU
0.6300 mg | INHALATION_SOLUTION | Freq: Three times a day (TID) | RESPIRATORY_TRACT | Status: DC
  Administered 2016-10-09 – 2016-10-11 (×7): 0.63 mg via RESPIRATORY_TRACT
  Filled 2016-10-09 (×7): qty 3

## 2016-10-09 MED ORDER — FUROSEMIDE 40 MG PO TABS
40.0000 mg | ORAL_TABLET | Freq: Every day | ORAL | Status: DC
  Administered 2016-10-09 – 2016-10-10 (×2): 40 mg via ORAL
  Filled 2016-10-09 (×2): qty 1

## 2016-10-09 NOTE — Progress Notes (Signed)
Subjective: She says she still feels weak. She's concerned that she cannot walk unassisted. Her breathing is better. She has some appetite now. She denies any chest pain. She's coughing up a little bit of sputum. No abdominal problems specifically no nausea vomiting diarrhea  Objective: Vital signs in last 24 hours: Temp:  [97.6 F (36.4 C)-98.8 F (37.1 C)] 98.8 F (37.1 C) (12/09 0400) Pulse Rate:  [88-110] 88 (12/09 0400) Resp:  [16] 16 (12/09 0400) BP: (118-155)/(58-66) 118/58 (12/09 0400) SpO2:  [94 %-100 %] 98 % (12/09 0734) Weight change:  Last BM Date: 10/08/16  Intake/Output from previous day: 12/08 0701 - 12/09 0700 In: 240 [P.O.:240] Out: -   PHYSICAL EXAM General appearance: alert, cooperative and mild distress Resp: Prolonged expiratory phase. She is moving air much better Cardio: regular rate and rhythm, S1, S2 normal, no murmur, click, rub or gallop GI: soft, non-tender; bowel sounds normal; no masses,  no organomegaly Extremities: extremities normal, atraumatic, no cyanosis or edema Skin warm and dry. Mucous membranes are moist.  Lab Results:  Results for orders placed or performed during the hospital encounter of 10/04/16 (from the past 48 hour(s))  CBC     Status: Abnormal   Collection Time: 10/08/16  4:54 AM  Result Value Ref Range   WBC 14.1 (H) 4.0 - 10.5 K/uL   RBC 4.00 3.87 - 5.11 MIL/uL   Hemoglobin 11.2 (L) 12.0 - 15.0 g/dL   HCT 35.8 (L) 36.0 - 46.0 %   MCV 89.5 78.0 - 100.0 fL   MCH 28.0 26.0 - 34.0 pg   MCHC 31.3 30.0 - 36.0 g/dL   RDW 14.7 11.5 - 15.5 %   Platelets 391 150 - 400 K/uL  Basic metabolic panel     Status: Abnormal   Collection Time: 10/08/16  4:54 AM  Result Value Ref Range   Sodium 138 135 - 145 mmol/L   Potassium 4.0 3.5 - 5.1 mmol/L   Chloride 98 (L) 101 - 111 mmol/L   CO2 34 (H) 22 - 32 mmol/L   Glucose, Bld 169 (H) 65 - 99 mg/dL   BUN 26 (H) 6 - 20 mg/dL   Creatinine, Ser 0.69 0.44 - 1.00 mg/dL   Calcium 9.1 8.9 -  10.3 mg/dL   GFR calc non Af Amer >60 >60 mL/min   GFR calc Af Amer >60 >60 mL/min    Comment: (NOTE) The eGFR has been calculated using the CKD EPI equation. This calculation has not been validated in all clinical situations. eGFR's persistently <60 mL/min signify possible Chronic Kidney Disease.    Anion gap 6 5 - 15  Magnesium     Status: None   Collection Time: 10/08/16  4:54 AM  Result Value Ref Range   Magnesium 2.0 1.7 - 2.4 mg/dL  TSH     Status: Abnormal   Collection Time: 10/08/16  4:54 AM  Result Value Ref Range   TSH 0.047 (L) 0.350 - 4.500 uIU/mL    Comment: Performed by a 3rd Generation assay with a functional sensitivity of <=0.01 uIU/mL.    ABGS No results for input(s): PHART, PO2ART, TCO2, HCO3 in the last 72 hours.  Invalid input(s): PCO2 CULTURES Recent Results (from the past 240 hour(s))  Urine culture     Status: Abnormal   Collection Time: 10/04/16  7:28 PM  Result Value Ref Range Status   Specimen Description URINE, CLEAN CATCH  Final   Special Requests NONE  Final   Culture MULTIPLE SPECIES  PRESENT, SUGGEST RECOLLECTION (A)  Final   Report Status 10/06/2016 FINAL  Final  Blood Culture (routine x 2)     Status: None   Collection Time: 10/04/16  8:40 PM  Result Value Ref Range Status   Specimen Description LEFT ANTECUBITAL  Final   Special Requests BOTTLES DRAWN AEROBIC AND ANAEROBIC 6CC  Final   Culture NO GROWTH 5 DAYS  Final   Report Status 10/09/2016 FINAL  Final  Blood Culture (routine x 2)     Status: None   Collection Time: 10/04/16  8:44 PM  Result Value Ref Range Status   Specimen Description BLOOD LEFT HAND  Final   Special Requests BOTTLES DRAWN AEROBIC AND ANAEROBIC 6CC  Final   Culture NO GROWTH 5 DAYS  Final   Report Status 10/09/2016 FINAL  Final  MRSA PCR Screening     Status: None   Collection Time: 10/04/16 11:20 PM  Result Value Ref Range Status   MRSA by PCR NEGATIVE NEGATIVE Final    Comment:        The GeneXpert MRSA  Assay (FDA approved for NASAL specimens only), is one component of a comprehensive MRSA colonization surveillance program. It is not intended to diagnose MRSA infection nor to guide or monitor treatment for MRSA infections.   Culture, sputum-assessment     Status: None   Collection Time: 10/05/16  9:00 PM  Result Value Ref Range Status   Specimen Description SPUTUM  Final   Special Requests Normal  Final   Sputum evaluation   Final    THIS SPECIMEN IS ACCEPTABLE. RESPIRATORY CULTURE REPORT TO FOLLOW. PERFORMED AT [facility]    Report Status 10/05/2016 FINAL  Final  Culture, respiratory (NON-Expectorated)     Status: None (Preliminary result)   Collection Time: 10/05/16  9:00 PM  Result Value Ref Range Status   Specimen Description SPUTUM  Final   Special Requests NONE  Final   Gram Stain   Final    NO WBC SEEN FEW YEAST RARE GRAM POSITIVE RODS Performed at [place]  Final   Report Status PENDING  Incomplete   Studies/Results: No results found.  Medications:  Prior to Admission:  Prescriptions Prior to Admission  Medication Sig Dispense Refill Last Dose  . aspirin EC 81 MG tablet Take 81 mg by mouth daily.   Past Week at Unknown time  . clonazePAM (KLONOPIN) 0.5 MG tablet Take 0.125 mg by mouth daily as needed (FOR BP LEVELS).   10/03/2016 at Unknown time  . furosemide (LASIX) 40 MG tablet Take 0.5 tablets (20 mg total) by mouth daily. (Patient taking differently: Take 20 mg by mouth 3 (three) times a week. ) 30 tablet  Past Week at Unknown time  . HYDROcodone-acetaminophen (NORCO/VICODIN) 5-325 MG tablet Take 0.5-1 tablets by mouth daily as needed for moderate pain.   Past Week at Unknown time  . ipratropium-albuterol (DUONEB) 0.5-2.5 (3) MG/3ML SOLN Take 3 mLs by nebulization every 4 (four) hours as needed. 360 mL  10/04/2016 at Unknown time  . polyethylene glycol (MIRALAX / GLYCOLAX) packet Take 17 g by mouth 2 (two) times daily.  (Patient taking differently: Take 17 g by mouth daily as needed for mild constipation. ) 14 each 0 10/04/2016 at Unknown time  . potassium chloride SA (K-DUR,KLOR-CON) 20 MEQ tablet Take 20 mEq by mouth 3 (three) times a week. Takes only when Furosemide is taken   Past Week at Unknown time  .  PROAIR HFA 108 (90 Base) MCG/ACT inhaler Inhale 1-2 puffs into the lungs every 6 (six) hours as needed for shortness of breath.    unknown   Scheduled: . aspirin EC  81 mg Oral Daily  . azithromycin  500 mg Intravenous Q24H  . cefTRIAXone (ROCEPHIN)  IV  1 g Intravenous Q24H  . enoxaparin (LOVENOX) injection  40 mg Subcutaneous Q24H  . feeding supplement (ENSURE ENLIVE)  237 mL Oral BID BM  . furosemide  40 mg Oral Daily  . guaiFENesin  1,200 mg Oral BID  . ipratropium  0.5 mg Nebulization TID  . levalbuterol  0.63 mg Nebulization TID  . lisinopril  2.5 mg Oral Daily  . mouth rinse  15 mL Mouth Rinse BID  . metoprolol succinate  25 mg Oral Daily  . potassium chloride SA  20 mEq Oral Once per day on Mon Wed Fri  . predniSONE  40 mg Oral Q breakfast   Continuous:  FMB:[id number], clonazePAM, HYDROcodone-acetaminophen, polyethylene glycol  Assesment: She was admitted with community-acquired pneumonia COPD exacerbation acute hypoxic respiratory failure and sepsis. She has improved. She is still with generalized weakness. Additionally she has acute on chronic systolic heart failure which is being treated elevated troponin which was felt not to represent acute coronary syndrome and she has malnutrition. She is clearly substantially improved but still weak Principal Problem:   Acute respiratory failure with hypoxia (HCC) Active Problems:   CHF (congestive heart failure) (HCC)   COPD (chronic obstructive pulmonary disease) (HCC)   CAP (community acquired pneumonia)   Sepsis ([place])   Elevated troponin   Hypokalemia   Moderate malnutrition (HCC)   Muscle weakness (generalized)   Sinus tachycardia   Acute  systolic CHF (congestive heart failure) (HCC)   Lobar pneumonia, unspecified organism ([place])    Plan: Continue treatments. I have requested that nursing get her up to see how she does with ambulation    LOS: 5 days   Tashara Suder L 10/09/2016, 10:09 AM

## 2016-10-09 NOTE — Progress Notes (Signed)
PROGRESS NOTE  Hannah Mckee [id number] DOB: 04-03-1931 DOA: 10/04/2016 PCP: Glenda Chroman, MD   LOS: 5 days   Brief Narrative: 82 year old female with hypertension, COPD not on home O2, history of CHF presented with flulike symptoms with productive cough and shortness of breath with associated orthopnea. Patient found to be in acute hypoxic respiratory failure secondary to lobar pneumonia and acute systolic CHF along with COPD exacerbation. Admitted to stepdown unit. Pulmonary and cardiology following.  Assessment & Plan: Principal Problem:   Acute respiratory failure with hypoxia (HCC) Active Problems:   CHF (congestive heart failure) (HCC)   COPD (chronic obstructive pulmonary disease) (HCC)   CAP (community acquired pneumonia)   Sepsis ([place])   Elevated troponin   Hypokalemia   Moderate malnutrition (HCC)   Muscle weakness (generalized)   Sinus tachycardia   Acute systolic CHF (congestive heart failure) (HCC)   Lobar pneumonia, unspecified organism ([place])   Acute respiratory failure with hypoxia (HCC) - Combination of lobar pneumonia, acute systolic CHF and COPD exacerbation. Patient initially require stepdown stay, and she was transferred to the floor on 12/8 - Continue oxygen, nebs, antibiotics, diuretics and steroid as outlined below.  Lobar pneumonia ([place]) - Symptoms improving. Continue empiric Rocephin and azithromycin. Supportive care with Tylenol and antitussives. - Blood cultures negative. Flu PCR and strep urine pneumonia antigen have been negative.  Acute systolic CHF (congestive heart failure) (HCC) - EF of 35-40% - Cardiology consulted, appreciate input. Patient has received IV Lasix for several days in a row, she started to have mild contraction alkalosis with a bicarbonate of 34, her BUN increased to 26 however her creatinine has remained stable. She is also hypochloremic. Despite that and clear evidence for adequate diuresis, her weight is up 107 >> 111.  I's and O's are not accurate and she has at least 5 documented episodes for unmeasured output - Continue lisinopril and metoprolol  ?hyperthyroidism - Concern for hyperthyroidism given decreased TSH, will check free T3 and T4  COPD (chronic obstructive pulmonary disease) (HCC) - With acute exacerbation. On IV Solu-Medrol, transitioned to by mouth prednisone 12/8 - Continue scheduled nebulizers. Supportive care with antitussives. On empiric antibiotics.  Elevated troponin - Possibly demand ischemia with acute symptoms. However given associated acute systolic dysfunction will likely warrant ischemic workup per cardiology, however it appears that this will be done once these acute episodes under control  Sinus tachycardia - improved with increasing beta blocker yesterday, switching patient to Xopenex  Hypokalemia - Replenish  Moderate protein calorie malnutrition ([place]) - Added supplement.  Generalized weakness - Seen by physical therapy and recommended home health with supervision.    DVT prophylaxis: Lovenox Code Status: Full Family Communication: no family bedside Disposition Plan: home when ready   Consultants:   Pulmonary  Cardiology   Procedures:   2D echo Impressions:  - Mild LVH with LVEF approximately 35-40% in the setting of   tachycardia. There is diffuse hypokinesis, most severe in the   basal inferior wall. Probable grade 2 diastolic dysfunction.   Severe left atrial enlargement. Calcified mitral annulus with   mild to moderate mitral regurgitation. Moderately calcified   aortic annulus. Moderate tricuspid regurgitation with PASP 15   mmHg.  Antimicrobials:  Ceftriaxone 12/4 >>  Azithromycin 12/5 >>  Doxycycline 1, 12/4  Subjective: - Continue to feel quite weak, and short of breath with ambulation. She was about to fall last night due to significant lower extremity weakness  Objective: Vitals:   10/08/16 1953 10/09/16  0400 10/09/16 0437  10/09/16 0734  BP:  (!) 118/58    Pulse: (!) 110 88    Resp: 16 16    Temp:  98.8 F (37.1 C)    TempSrc:  Oral    SpO2: 98% 96% 98% 98%  Weight:      Height:        Intake/Output Summary (Last 24 hours) at 10/09/16 1256 Last data filed at 10/08/16 1831  Gross per 24 hour  Intake              120 ml  Output                0 ml  Net              120 ml   Filed Weights   10/06/16 0500 10/07/16 0500 10/08/16 0500  Weight: 49.2 kg (108 lb 7.5 oz) 49.5 kg (109 lb 2 oz) 50.4 kg (111 lb 1.8 oz)    Examination: Constitutional: NAD, frail appearing female  Vitals:   10/08/16 1953 10/09/16 0400 10/09/16 0437 10/09/16 0734  BP:  (!) 118/58    Pulse: (!) 110 88    Resp: 16 16    Temp:  98.8 F (37.1 C)    TempSrc:  Oral    SpO2: 98% 96% 98% 98%  Weight:      Height:       Eyes: lids and conjunctivae normal ENMT: Mucous membranes are moist. No oropharyngeal exudates Neck: normal, supple, no masses, no thyromegaly Respiratory: clear to auscultation bilaterally, no wheezing, no crackles. Overall decreased breath sounds Cardiovascular: Regular rate and rhythm, no murmurs / rubs / gallops. No LE edema.  Abdomen: no tenderness. Bowel sounds positive.  Neurologic: non focal   Data Reviewed: I have personally reviewed following labs and imaging studies  CBC:  Recent Labs Lab 10/04/16 1725 10/05/16 0458 10/06/16 0417 10/08/16 0454  WBC 19.7* 21.7* 15.0* 14.1*  NEUTROABS 17.1* 18.9*  --   --   HGB 12.2 10.7* 10.5* 11.2*  HCT 38.0 34.0* 33.5* 35.8*  MCV 88.2 87.9 87.9 89.5  PLT 368 326 318 0000000   Basic Metabolic Panel:  Recent Labs Lab 10/04/16 1725 10/05/16 0458 10/06/16 0417 10/07/16 0438 10/08/16 0454  NA 135 135 139 139 138  K 3.3* 3.5 3.7 4.3 4.0  CL 99* 104 106 104 98*  CO2 26 26 27 30  34*  GLUCOSE 97 121* 174* 146* 169*  BUN 11 9 13 19  26*  CREATININE 0.69 0.49 0.50 0.61 0.69  CALCIUM 9.3 8.6* 9.0 9.0 9.1  MG  --   --   --   --  2.0   GFR: Estimated  Creatinine Clearance: 34.2 mL/min (by C-G formula based on SCr of 0.69 mg/dL). Liver Function Tests:  Recent Labs Lab 10/04/16 1725 10/06/16 0417  AST 17 11*  ALT 11* 12*  ALKPHOS 126 105  BILITOT 0.8 0.4  PROT 7.6 6.0*  ALBUMIN 3.2* 2.3*   Cardiac Enzymes:  Recent Labs Lab 10/04/16 1725 10/04/16 2040 10/04/16 2328 10/05/16 0458 10/05/16 1034  TROPONINI 0.04* 0.04* 0.05* 0.07* 0.06*   Thyroid Function Tests:  Recent Labs  10/08/16 0454  TSH 0.047*   Urine analysis:    Component Value Date/Time   COLORURINE YELLOW 10/04/2016 1928   APPEARANCEUR CLEAR 10/04/2016 1928   LABSPEC 1.020 10/04/2016 1928   PHURINE 6.0 10/04/2016 1928   GLUCOSEU NEGATIVE 10/04/2016 1928   HGBUR LARGE (A) 10/04/2016 1928   BILIRUBINUR SMALL (  A) 10/04/2016 1928   KETONESUR >80 (A) 10/04/2016 1928   PROTEINUR 100 (A) 10/04/2016 1928   NITRITE NEGATIVE 10/04/2016 1928   LEUKOCYTESUR TRACE (A) 10/04/2016 1928   Sepsis Labs: Invalid input(s): PROCALCITONIN, LACTICIDVEN  Recent Results (from the past 240 hour(s))  Urine culture     Status: Abnormal   Collection Time: 10/04/16  7:28 PM  Result Value Ref Range Status   Specimen Description URINE, CLEAN CATCH  Final   Special Requests NONE  Final   Culture MULTIPLE SPECIES PRESENT, SUGGEST RECOLLECTION (A)  Final   Report Status 10/06/2016 FINAL  Final  Blood Culture (routine x 2)     Status: None   Collection Time: 10/04/16  8:40 PM  Result Value Ref Range Status   Specimen Description LEFT ANTECUBITAL  Final   Special Requests BOTTLES DRAWN AEROBIC AND ANAEROBIC 6CC  Final   Culture NO GROWTH 5 DAYS  Final   Report Status 10/09/2016 FINAL  Final  Blood Culture (routine x 2)     Status: None   Collection Time: 10/04/16  8:44 PM  Result Value Ref Range Status   Specimen Description BLOOD LEFT HAND  Final   Special Requests BOTTLES DRAWN AEROBIC AND ANAEROBIC 6CC  Final   Culture NO GROWTH 5 DAYS  Final   Report Status 10/09/2016  FINAL  Final  MRSA PCR Screening     Status: None   Collection Time: 10/04/16 11:20 PM  Result Value Ref Range Status   MRSA by PCR NEGATIVE NEGATIVE Final    Comment:        The GeneXpert MRSA Assay (FDA approved for NASAL specimens only), is one component of a comprehensive MRSA colonization surveillance program. It is not intended to diagnose MRSA infection nor to guide or monitor treatment for MRSA infections.   Culture, sputum-assessment     Status: None   Collection Time: 10/05/16  9:00 PM  Result Value Ref Range Status   Specimen Description SPUTUM  Final   Special Requests Normal  Final   Sputum evaluation   Final    THIS SPECIMEN IS ACCEPTABLE. RESPIRATORY CULTURE REPORT TO FOLLOW. PERFORMED AT [facility]    Report Status 10/05/2016 FINAL  Final  Culture, respiratory (NON-Expectorated)     Status: None   Collection Time: 10/05/16  9:00 PM  Result Value Ref Range Status   Specimen Description SPUTUM  Final   Special Requests NONE  Final   Gram Stain   Final    NO WBC SEEN FEW YEAST RARE GRAM POSITIVE RODS Performed at [facility]    Culture   Final    [place] GLABRATA    Report Status 10/09/2016 FINAL  Final      Radiology Studies: No results found.   Scheduled Meds: . aspirin EC  81 mg Oral Daily  . azithromycin  500 mg Intravenous Q24H  . cefTRIAXone (ROCEPHIN)  IV  1 g Intravenous Q24H  . enoxaparin (LOVENOX) injection  40 mg Subcutaneous Q24H  . feeding supplement (ENSURE ENLIVE)  237 mL Oral BID BM  . furosemide  40 mg Oral Daily  . guaiFENesin  1,200 mg Oral BID  . ipratropium  0.5 mg Nebulization TID  . levalbuterol  0.63 mg Nebulization TID  . lisinopril  2.5 mg Oral Daily  . mouth rinse  15 mL Mouth Rinse BID  . metoprolol succinate  25 mg Oral Daily  . potassium chloride SA  20 mEq Oral Once per  day on Mon Wed Fri  . predniSONE  40 mg Oral Q breakfast   Continuous Infusions:   Marzetta Board, MD,  PhD [facility] Pager [phone]  If 7PM-7AM, please contact night-coverage [email or web address] Password [email or web address] 10/09/2016, 12:56 PM

## 2016-10-10 LAB — BASIC METABOLIC PANEL
Anion gap: 8 (ref 5–15)
BUN: 31 mg/dL — AB (ref 6–20)
CO2: 34 mmol/L — ABNORMAL HIGH (ref 22–32)
CREATININE: 0.65 mg/dL (ref 0.44–1.00)
Calcium: 9.2 mg/dL (ref 8.9–10.3)
Chloride: 95 mmol/L — ABNORMAL LOW (ref 101–111)
Glucose, Bld: 126 mg/dL — ABNORMAL HIGH (ref 65–99)
POTASSIUM: 3.9 mmol/L (ref 3.5–5.1)
SODIUM: 137 mmol/L (ref 135–145)

## 2016-10-10 LAB — T4, FREE: FREE T4: 1.4 ng/dL — AB (ref 0.61–1.12)

## 2016-10-10 MED ORDER — DEXTROSE 5 % IV SOLN
INTRAVENOUS | Status: AC
  Filled 2016-10-10: qty 10

## 2016-10-10 MED ORDER — PREDNISONE 20 MG PO TABS
20.0000 mg | ORAL_TABLET | Freq: Every day | ORAL | Status: DC
  Administered 2016-10-11: 20 mg via ORAL
  Filled 2016-10-10: qty 1

## 2016-10-10 NOTE — Progress Notes (Signed)
SATURATION QUALIFICATIONS: (This note is used to comply with regulatory documentation for home oxygen)  Patient Saturations on Room Air at Rest = 92%  Patient Saturations on Room Air while Ambulating = 82%  Patient Saturations on 0 Liters of oxygen while Ambulating = 82%  Please briefly explain why patient needs home oxygen:

## 2016-10-10 NOTE — Progress Notes (Signed)
PROGRESS NOTE  SUZANE CHALFIN [id number] DOB: [birth year] DOA: 10/04/2016 PCP: Glenda Chroman, MD   LOS: 6 days   Brief Narrative: 82 year old female with hypertension, COPD not on home O2, history of CHF presented with flulike symptoms with productive cough and shortness of breath with associated orthopnea. Patient found to be in acute hypoxic respiratory failure secondary to lobar pneumonia and acute systolic CHF along with COPD exacerbation. Admitted to stepdown unit. Pulmonary and cardiology following.  Assessment & Plan: Principal Problem:   Acute respiratory failure with hypoxia (HCC) Active Problems:   CHF (congestive heart failure) (HCC)   COPD (chronic obstructive pulmonary disease) (HCC)   CAP (community acquired pneumonia)   Sepsis ([place])   Elevated troponin   Hypokalemia   Moderate malnutrition (HCC)   Muscle weakness (generalized)   Sinus tachycardia   Acute systolic CHF (congestive heart failure) (HCC)   Lobar pneumonia, unspecified organism ([place])   Acute respiratory failure with hypoxia (HCC) - Combination of lobar pneumonia, acute systolic CHF and COPD exacerbation. Patient initially require stepdown stay, and she was transferred to the floor on 12/8 - Continue oxygen, nebs, antibiotics, diuretics and steroid today - continues to feel weak today and dyspneic with ambulation. Unable to move well independently. Will ask PT to re-evaluate  Lobar pneumonia ([place]) - Symptoms improving. Continue empiric Rocephin and azithromycin, today day 7 and will stop tomorrow. Supportive care with Tylenol and antitussives. - Blood cultures negative. Flu PCR and strep urine pneumonia antigen have been negative.  Acute systolic CHF (congestive heart failure) (HCC) - EF of 35-40% - Cardiology consulted, appreciate input. Patient has received IV Lasix for several days in a row, she started to have mild contraction alkalosis with a bicarbonate of 34, her BUN increased to 26 however  her creatinine has remained stable. She is also hypochloremic. Continue by mouth Lasix. Weight was going up findings coming down, she is 105 pounds today from 107 on admission. - Continue lisinopril and metoprolol  ?hyperthyroidism - Concern for hyperthyroidism given decreased TSH, check free T3 and T4  COPD (chronic obstructive pulmonary disease) ([place]) - With acute exacerbation. On IV Solu-Medrol, transitioned to by mouth prednisone 12/8 - Continue scheduled nebulizers. Supportive care with antitussives. On empiric antibiotics. - Today's last day of antibiotics, start tapering prednisone tomorrow  Elevated troponin - Possibly demand ischemia with acute symptoms. However given associated acute systolic dysfunction will likely warrant ischemic workup per cardiology, however it appears that this will be done once these acute episodes under control  Sinus tachycardia - improved with increasing beta blocker yesterday, switching patient to Xopenex  Hypokalemia - Replenish  Moderate protein calorie malnutrition ([place]) - Added supplement.  Generalized weakness - Seen by physical therapy and recommended home health with supervision.    DVT prophylaxis: Lovenox Code Status: Full Family Communication: no family bedside Disposition Plan: home 1 day  Consultants:   Pulmonary  Cardiology   Procedures:   2D echo Impressions: - Mild LVH with LVEF approximately 35-40% in the setting of tachycardia. There is diffuse hypokinesis, most severe in the basal inferior wall. Probable grade 2 diastolic dysfunction. Severe left atrial enlargement. Calcified mitral annulus with mild to moderate mitral regurgitation. Moderately calcified aortic annulus. Moderate tricuspid regurgitation with PASP 15 mmHg.  Antimicrobials:  Ceftriaxone 12/4 >>  Azithromycin 12/5 >>  Doxycycline 1, 12/4  Subjective: - Continue to feel quite weak, improving, complains of dyspnea with minimal ambulation.  Concerned about not being independent and home environment.   Objective:  Vitals:   10/09/16 2022 10/09/16 2140 10/10/16 0554 10/10/16 0741  BP:  (!) 142/47 129/78   Pulse: 92 98 87   Resp: 18 17 20    Temp:  97.6 F (36.4 C) 98.1 F (36.7 C)   TempSrc:  Oral Oral   SpO2: 94% 98% 100% 99%  Weight:   47.9 kg (105 lb 9.6 oz)   Height:        Intake/Output Summary (Last 24 hours) at 10/10/16 1041 Last data filed at 10/10/16 1029  Gross per 24 hour  Intake              520 ml  Output              600 ml  Net              -80 ml   Filed Weights   10/07/16 0500 10/08/16 0500 10/10/16 0554  Weight: 49.5 kg (109 lb 2 oz) 50.4 kg (111 lb 1.8 oz) 47.9 kg (105 lb 9.6 oz)    Examination: Constitutional: NAD, frail appearing female  Vitals:   10/09/16 2022 10/09/16 2140 10/10/16 0554 10/10/16 0741  BP:  (!) 142/47 129/78   Pulse: 92 98 87   Resp: 18 17 20    Temp:  97.6 F (36.4 C) 98.1 F (36.7 C)   TempSrc:  Oral Oral   SpO2: 94% 98% 100% 99%  Weight:   47.9 kg (105 lb 9.6 oz)   Height:       Eyes: lids and conjunctivae normal ENMT: Mucous membranes are moist. No oropharyngeal exudates Neck: normal, supple, no masses, no thyromegaly Respiratory: clear to auscultation bilaterally, no wheezing, no crackles. Overall decreased breath sounds Cardiovascular: Regular rate and rhythm, no murmurs / rubs / gallops. No LE edema.  Abdomen: no tenderness. Bowel sounds positive.  Neurologic: non focal   Data Reviewed: I have personally reviewed following labs and imaging studies  CBC:  Recent Labs Lab 10/04/16 1725 10/05/16 0458 10/06/16 0417 10/08/16 0454  WBC 19.7* 21.7* 15.0* 14.1*  NEUTROABS 17.1* 18.9*  --   --   HGB 12.2 10.7* 10.5* 11.2*  HCT 38.0 34.0* 33.5* 35.8*  MCV 88.2 87.9 87.9 89.5  PLT 368 326 318 0000000   Basic Metabolic Panel:  Recent Labs Lab 10/05/16 0458 10/06/16 0417 10/07/16 0438 10/08/16 0454 10/10/16 0630  NA 135 139 139 138 137  K 3.5 3.7 4.3  4.0 3.9  CL 104 106 104 98* 95*  CO2 26 27 30  34* 34*  GLUCOSE 121* 174* 146* 169* 126*  BUN 9 13 19  26* 31*  CREATININE 0.49 0.50 0.61 0.69 0.65  CALCIUM 8.6* 9.0 9.0 9.1 9.2  MG  --   --   --  2.0  --    GFR: Estimated Creatinine Clearance: 33.3 mL/min (by C-G formula based on SCr of 0.65 mg/dL). Liver Function Tests:  Recent Labs Lab 10/04/16 1725 10/06/16 0417  AST 17 11*  ALT 11* 12*  ALKPHOS 126 105  BILITOT 0.8 0.4  PROT 7.6 6.0*  ALBUMIN 3.2* 2.3*   Cardiac Enzymes:  Recent Labs Lab 10/04/16 1725 10/04/16 2040 10/04/16 2328 10/05/16 0458 10/05/16 1034  TROPONINI 0.04* 0.04* 0.05* 0.07* 0.06*   Thyroid Function Tests:  Recent Labs  10/08/16 0454  TSH 0.047*   Urine analysis:    Component Value Date/Time   COLORURINE YELLOW 10/04/2016 1928   APPEARANCEUR CLEAR 10/04/2016 1928   LABSPEC 1.020 10/04/2016 1928   PHURINE 6.0 10/04/2016 1928  GLUCOSEU NEGATIVE 10/04/2016 1928   HGBUR LARGE (A) 10/04/2016 1928   BILIRUBINUR SMALL (A) 10/04/2016 1928   KETONESUR >80 (A) 10/04/2016 1928   PROTEINUR 100 (A) 10/04/2016 1928   NITRITE NEGATIVE 10/04/2016 1928   LEUKOCYTESUR TRACE (A) 10/04/2016 1928   Sepsis Labs: Invalid input(s): PROCALCITONIN, LACTICIDVEN  Recent Results (from the past 240 hour(s))  Urine culture     Status: Abnormal   Collection Time: 10/04/16  7:28 PM  Result Value Ref Range Status   Specimen Description URINE, CLEAN CATCH  Final   Special Requests NONE  Final   Culture MULTIPLE SPECIES PRESENT, SUGGEST RECOLLECTION (A)  Final   Report Status 10/06/2016 FINAL  Final  Blood Culture (routine x 2)     Status: None   Collection Time: 10/04/16  8:40 PM  Result Value Ref Range Status   Specimen Description LEFT ANTECUBITAL  Final   Special Requests BOTTLES DRAWN AEROBIC AND ANAEROBIC 6CC  Final   Culture NO GROWTH 5 DAYS  Final   Report Status 10/09/2016 FINAL  Final  Blood Culture (routine x 2)     Status: None   Collection  Time: 10/04/16  8:44 PM  Result Value Ref Range Status   Specimen Description BLOOD LEFT HAND  Final   Special Requests BOTTLES DRAWN AEROBIC AND ANAEROBIC 6CC  Final   Culture NO GROWTH 5 DAYS  Final   Report Status 10/09/2016 FINAL  Final  MRSA PCR Screening     Status: None   Collection Time: 10/04/16 11:20 PM  Result Value Ref Range Status   MRSA by PCR NEGATIVE NEGATIVE Final    Comment:        The GeneXpert MRSA Assay (FDA approved for NASAL specimens only), is one component of a comprehensive MRSA colonization surveillance program. It is not intended to diagnose MRSA infection nor to guide or monitor treatment for MRSA infections.   Culture, sputum-assessment     Status: None   Collection Time: 10/05/16  9:00 PM  Result Value Ref Range Status   Specimen Description SPUTUM  Final   Special Requests Normal  Final   Sputum evaluation   Final    THIS SPECIMEN IS ACCEPTABLE. RESPIRATORY CULTURE REPORT TO FOLLOW. PERFORMED AT [facility]    Report Status 10/05/2016 FINAL  Final  Culture, respiratory (NON-Expectorated)     Status: None   Collection Time: 10/05/16  9:00 PM  Result Value Ref Range Status   Specimen Description SPUTUM  Final   Special Requests NONE  Final   Gram Stain   Final    NO WBC SEEN FEW YEAST RARE GRAM POSITIVE RODS Performed at [facility]    Culture   Final    [place] GLABRATA    Report Status 10/09/2016 FINAL  Final      Radiology Studies: No results found.   Scheduled Meds: . aspirin EC  81 mg Oral Daily  . azithromycin  500 mg Intravenous Q24H  . cefTRIAXone (ROCEPHIN)  IV  1 g Intravenous Q24H  . enoxaparin (LOVENOX) injection  40 mg Subcutaneous Q24H  . feeding supplement (ENSURE ENLIVE)  237 mL Oral BID BM  . furosemide  40 mg Oral Daily  . guaiFENesin  1,200 mg Oral BID  . ipratropium  0.5 mg Nebulization TID  . levalbuterol  0.63 mg Nebulization TID  . lisinopril  2.5 mg Oral Daily  .  mouth rinse  15 mL Mouth Rinse BID  . metoprolol succinate  25 mg Oral Daily  . potassium chloride SA  20 mEq Oral Once per day on Mon Wed Fri  . predniSONE  40 mg Oral Q breakfast   Continuous Infusions:   Marzetta Board, MD, PhD [facility] Pager [phone]  If 7PM-7AM, please contact night-coverage [email or web address] Password [email or web address] 10/10/2016, 10:41 AM

## 2016-10-10 NOTE — Progress Notes (Signed)
Subjective: She says she is still weak but getting better. She's coughing a little bit. Her breathing feels better. No other new complaints. No chest pain nausea vomiting no PND or orthopnea  Objective: Vital signs in last 24 hours: Temp:  [97.6 F (36.4 C)-98.1 F (36.7 C)] 98.1 F (36.7 C) (12/10 0554) Pulse Rate:  [87-98] 87 (12/10 0554) Resp:  [17-20] 20 (12/10 0554) BP: (129-142)/(47-78) 129/78 (12/10 0554) SpO2:  [94 %-100 %] 99 % (12/10 0741) Weight:  [47.9 kg (105 lb 9.6 oz)] 47.9 kg (105 lb 9.6 oz) (12/10 0554) Weight change:  Last BM Date: 10/09/16  Intake/Output from previous day: 12/09 0701 - 12/10 0700 In: 520 [P.O.:240; IV Piggyback:280] Out: 200 [Urine:200]  PHYSICAL EXAM General appearance: alert, cooperative and no distress Resp: Decreased breath sounds Cardio: regular rate and rhythm, S1, S2 normal, no murmur, click, rub or gallop GI: soft, non-tender; bowel sounds normal; no masses,  no organomegaly Extremities: extremities normal, atraumatic, no cyanosis or edema Mucous membranes are moist. Pupils react. Skin warm and dry  Lab Results:  Results for orders placed or performed during the hospital encounter of 10/04/16 (from the past 48 hour(s))  Basic metabolic panel     Status: Abnormal   Collection Time: 10/10/16  6:30 AM  Result Value Ref Range   Sodium 137 135 - 145 mmol/L   Potassium 3.9 3.5 - 5.1 mmol/L   Chloride 95 (L) 101 - 111 mmol/L   CO2 34 (H) 22 - 32 mmol/L   Glucose, Bld 126 (H) 65 - 99 mg/dL   BUN 31 (H) 6 - 20 mg/dL   Creatinine, Ser 0.65 0.44 - 1.00 mg/dL   Calcium 9.2 8.9 - 10.3 mg/dL   GFR calc non Af Amer >60 >60 mL/min   GFR calc Af Amer >60 >60 mL/min    Comment: (NOTE) The eGFR has been calculated using the CKD EPI equation. This calculation has not been validated in all clinical situations. eGFR's persistently <60 mL/min signify possible Chronic Kidney Disease.    Anion gap 8 5 - 15    ABGS No results for input(s):  PHART, PO2ART, TCO2, HCO3 in the last 72 hours.  Invalid input(s): PCO2 CULTURES Recent Results (from the past 240 hour(s))  Urine culture     Status: Abnormal   Collection Time: 10/04/16  7:28 PM  Result Value Ref Range Status   Specimen Description URINE, CLEAN CATCH  Final   Special Requests NONE  Final   Culture MULTIPLE SPECIES PRESENT, SUGGEST RECOLLECTION (A)  Final   Report Status 10/06/2016 FINAL  Final  Blood Culture (routine x 2)     Status: None   Collection Time: 10/04/16  8:40 PM  Result Value Ref Range Status   Specimen Description LEFT ANTECUBITAL  Final   Special Requests BOTTLES DRAWN AEROBIC AND ANAEROBIC 6CC  Final   Culture NO GROWTH 5 DAYS  Final   Report Status 10/09/2016 FINAL  Final  Blood Culture (routine x 2)     Status: None   Collection Time: 10/04/16  8:44 PM  Result Value Ref Range Status   Specimen Description BLOOD LEFT HAND  Final   Special Requests BOTTLES DRAWN AEROBIC AND ANAEROBIC 6CC  Final   Culture NO GROWTH 5 DAYS  Final   Report Status 10/09/2016 FINAL  Final  MRSA PCR Screening     Status: None   Collection Time: 10/04/16 11:20 PM  Result Value Ref Range Status   MRSA by PCR NEGATIVE  NEGATIVE Final    Comment:        The GeneXpert MRSA Assay (FDA approved for NASAL specimens only), is one component of a comprehensive MRSA colonization surveillance program. It is not intended to diagnose MRSA infection nor to guide or monitor treatment for MRSA infections.   Culture, sputum-assessment     Status: None   Collection Time: 10/05/16  9:00 PM  Result Value Ref Range Status   Specimen Description SPUTUM  Final   Special Requests Normal  Final   Sputum evaluation   Final    THIS SPECIMEN IS ACCEPTABLE. RESPIRATORY CULTURE REPORT TO FOLLOW. PERFORMED AT [facility]    Report Status 10/05/2016 FINAL  Final  Culture, respiratory (NON-Expectorated)     Status: None   Collection Time: 10/05/16  9:00 PM  Result Value Ref Range Status    Specimen Description SPUTUM  Final   Special Requests NONE  Final   Gram Stain   Final    NO WBC SEEN FEW YEAST RARE GRAM POSITIVE RODS Performed at [facility]    Culture   Final    [place] GLABRATA    Report Status 10/09/2016 FINAL  Final   Studies/Results: No results found.  Medications:  Prior to Admission:  Prescriptions Prior to Admission  Medication Sig Dispense Refill Last Dose  . aspirin EC 81 MG tablet Take 81 mg by mouth daily.   Past Week at Unknown time  . clonazePAM (KLONOPIN) 0.5 MG tablet Take 0.125 mg by mouth daily as needed (FOR BP LEVELS).   10/03/2016 at Unknown time  . furosemide (LASIX) 40 MG tablet Take 0.5 tablets (20 mg total) by mouth daily. (Patient taking differently: Take 20 mg by mouth 3 (three) times a week. ) 30 tablet  Past Week at Unknown time  . HYDROcodone-acetaminophen (NORCO/VICODIN) 5-325 MG tablet Take 0.5-1 tablets by mouth daily as needed for moderate pain.   Past Week at Unknown time  . ipratropium-albuterol (DUONEB) 0.5-2.5 (3) MG/3ML SOLN Take 3 mLs by nebulization every 4 (four) hours as needed. 360 mL  10/04/2016 at Unknown time  . polyethylene glycol (MIRALAX / GLYCOLAX) packet Take 17 g by mouth 2 (two) times daily. (Patient taking differently: Take 17 g by mouth daily as needed for mild constipation. ) 14 each 0 10/04/2016 at Unknown time  . potassium chloride SA (K-DUR,KLOR-CON) 20 MEQ tablet Take 20 mEq by mouth 3 (three) times a week. Takes only when Furosemide is taken   Past Week at Unknown time  . PROAIR HFA 108 (90 Base) MCG/ACT inhaler Inhale 1-2 puffs into the lungs every 6 (six) hours as needed for shortness of breath.    unknown   Scheduled: . aspirin EC  81 mg Oral Daily  . azithromycin  500 mg Intravenous Q24H  . cefTRIAXone (ROCEPHIN)  IV  1 g Intravenous Q24H  . enoxaparin (LOVENOX) injection  40 mg Subcutaneous Q24H  . feeding supplement (ENSURE ENLIVE)  237 mL Oral BID BM  .  furosemide  40 mg Oral Daily  . guaiFENesin  1,200 mg Oral BID  . ipratropium  0.5 mg Nebulization TID  . levalbuterol  0.63 mg Nebulization TID  . lisinopril  2.5 mg Oral Daily  . mouth rinse  15 mL Mouth Rinse BID  . metoprolol succinate  25 mg Oral Daily  . potassium chloride SA  20 mEq Oral Once per day on Mon Wed Fri  . predniSONE  40 mg Oral Q  breakfast   Continuous:  JDF:[id number], clonazePAM, HYDROcodone-acetaminophen, polyethylene glycol  Assesment: She was admitted with community-acquired pneumonia acute hypoxic respiratory failure and sepsis. She is doing much better. She also has acute on chronic heart failure which is also better. She is still very weak. She hopes to go home tomorrow. She is being assessed for oxygen needs at home today. Principal Problem:   Acute respiratory failure with hypoxia (HCC) Active Problems:   CHF (congestive heart failure) (HCC)   COPD (chronic obstructive pulmonary disease) (HCC)   CAP (community acquired pneumonia)   Sepsis ([place])   Elevated troponin   Hypokalemia   Moderate malnutrition (HCC)   Muscle weakness (generalized)   Sinus tachycardia   Acute systolic CHF (congestive heart failure) (HCC)   Lobar pneumonia, unspecified organism ([place])    Plan: I will plan to sign off. I will have my office call tomorrow to make her a follow-up appointment.    LOS: 6 days   Pavielle Biggar L 10/10/2016, 10:19 AM

## 2016-10-10 NOTE — Progress Notes (Signed)
Pharmacy Antibiotic Note  Hannah Mckee is a 82 y.o. female presented to ED on 10/04/2016 with CAP.  Pharmacy consulted for ceftriaxone dosing No renal adjustment needed. Plan: Cont antibiotics as ordered.  Consider dc azithromycin Pharmacy will sign off.  Please reconsult as needed  Height: 4\' 7"  (139.7 cm) Weight: 105 lb 9.6 oz (47.9 kg) IBW/kg (Calculated) : 34  Temp (24hrs), Avg:97.8 F (36.6 C), Min:97.6 F (36.4 C), Max:98.1 F (36.7 C)   Recent Labs Lab 10/04/16 1725 10/04/16 2104 10/05/16 0458 10/06/16 0417 10/07/16 0438 10/08/16 0454 10/10/16 0630  WBC 19.7*  --  21.7* 15.0*  --  14.1*  --   CREATININE 0.69  --  0.49 0.50 0.61 0.69 0.65  LATICACIDVEN  --  0.97  --   --   --   --   --     Estimated Creatinine Clearance: 33.3 mL/min (by C-G formula based on SCr of 0.65 mg/dL).    Allergies  Allergen Reactions  . Erythromycin Swelling  . Levaquin [Levofloxacin] Other (See Comments)    Tongue turns red and sore  . Penicillins Swelling    Has patient had a PCN reaction causing immediate rash, facial/tongue/throat swelling, SOB or lightheadedness with hypotension: Yes Has patient had a PCN reaction causing severe rash involving mucus membranes or skin necrosis: No Has patient had a PCN reaction that required hospitalization No Has patient had a PCN reaction occurring within the last 10 years: No If all of the above answers are "NO", then may proceed with Cephalosporin use.   . Sulfa Antibiotics Other (See Comments)    Tongue turns red and sore    Antimicrobials this admission: Vancomycin  12/4 >> 12/5 Doxy 12/4 >> 12/4 Ceftriaxone 12/4 >> Azithromycin 12/5 >>  Dose adjustments this admission: n/a   Microbiology results: 12/5 Influenzae: negative 12/4 Blood cx:  Pending 12/4 MRSA PCR; negative  Thank you for allowing pharmacy to be a part of this patient's care.  Excell Seltzer, PharmD Clinical Pharmacist 10/10/2016 8:36 AM

## 2016-10-10 NOTE — Progress Notes (Signed)
Oxygen not replace post nebulizer treatment so that room air saturation could be obtained.

## 2016-10-11 DIAGNOSIS — E876 Hypokalemia: Secondary | ICD-10-CM

## 2016-10-11 DIAGNOSIS — A419 Sepsis, unspecified organism: Principal | ICD-10-CM

## 2016-10-11 DIAGNOSIS — J441 Chronic obstructive pulmonary disease with (acute) exacerbation: Secondary | ICD-10-CM | POA: Diagnosis present

## 2016-10-11 LAB — BASIC METABOLIC PANEL
ANION GAP: 5 (ref 5–15)
BUN: 27 mg/dL — ABNORMAL HIGH (ref 6–20)
CALCIUM: 8.5 mg/dL — AB (ref 8.9–10.3)
CHLORIDE: 96 mmol/L — AB (ref 101–111)
CO2: 34 mmol/L — AB (ref 22–32)
Creatinine, Ser: 0.82 mg/dL (ref 0.44–1.00)
GFR calc Af Amer: >60 mL/min (ref >60)
GFR calc non Af Amer: >60 mL/min (ref >60)
GLUCOSE: 90 mg/dL (ref 65–99)
Potassium: 3.9 mmol/L (ref 3.5–5.1)
Sodium: 135 mmol/L (ref 135–145)

## 2016-10-11 LAB — T3, FREE: T3, Free: 1.9 pg/mL — ABNORMAL LOW (ref 2.0–4.4)

## 2016-10-11 MED ORDER — FUROSEMIDE 20 MG PO TABS: 20.0000 mg | ORAL_TABLET | Freq: Every day | ORAL | Status: DC

## 2016-10-11 MED ORDER — METOPROLOL SUCCINATE ER 25 MG PO TB24
37.5000 mg | ORAL_TABLET | Freq: Every day | ORAL | Status: DC
  Administered 2016-10-11: 37.5 mg via ORAL
  Filled 2016-10-11: qty 2

## 2016-10-11 MED ORDER — METOPROLOL SUCCINATE ER 25 MG PO TB24: 37.5000 mg | ORAL_TABLET | Freq: Every day | ORAL | 0 refills | Status: DC

## 2016-10-11 MED ORDER — FLUCONAZOLE 100 MG PO TABS: 100.0000 mg | ORAL_TABLET | Freq: Every day | ORAL | 0 refills | Status: AC

## 2016-10-11 MED ORDER — ENSURE ENLIVE PO LIQD
237.0000 mL | Freq: Two times a day (BID) | ORAL | 12 refills | Status: AC
Start: 2016-10-11 — End: ?

## 2016-10-11 MED ORDER — LISINOPRIL 2.5 MG PO TABS: 2.5000 mg | ORAL_TABLET | Freq: Every day | ORAL | 0 refills | Status: DC

## 2016-10-11 MED ORDER — GUAIFENESIN ER 600 MG PO TB12: 1200.0000 mg | ORAL_TABLET | Freq: Two times a day (BID) | ORAL | 0 refills | Status: DC

## 2016-10-11 MED ORDER — PREDNISONE 20 MG PO TABS: 20.0000 mg | ORAL_TABLET | Freq: Every day | ORAL | 0 refills | Status: AC

## 2016-10-11 NOTE — Progress Notes (Signed)
Subjective:    SOB improving.   Objective:   Temp:  [97.9 F (36.6 C)-98.2 F (36.8 C)] 98.1 F (36.7 C) (12/11 0612) Pulse Rate:  [75-94] 75 (12/11 0612) Resp:  [16-18] 16 (12/11 0612) BP: (119-142)/(51-74) 128/53 (12/11 0612) SpO2:  [95 %-99 %] 99 % (12/11 0741) Weight:  [105 lb 1.6 oz (47.7 kg)] 105 lb 1.6 oz (47.7 kg) (12/11 0612) Last BM Date: 10/09/16  Filed Weights   10/08/16 0500 10/10/16 0554 10/11/16 0612  Weight: 111 lb 1.8 oz (50.4 kg) 105 lb 9.6 oz (47.9 kg) 105 lb 1.6 oz (47.7 kg)    Intake/Output Summary (Last 24 hours) at 10/11/16 0935 Last data filed at 10/11/16 0300  Gross per 24 hour  Intake              480 ml  Output             1900 ml  Net            -1420 ml    Telemetry: SR. Short runs of PSVT and NSVT   Exam:  General: NAD  HEENT: sclera clear, throat clear  Resp:CTAB  Cardiac: RRR, no m/r/g, no jvd  GI: abdomen soft, NT, ND  MSK: no LE edema  Neuro: no focal deficits  Psych: appropriate affect  Lab Results:  Basic Metabolic Panel:  Recent Labs Lab 10/08/16 0454 10/10/16 0630 10/11/16 0459  NA 138 137 135  K 4.0 3.9 3.9  CL 98* 95* 96*  CO2 34* 34* 34*  GLUCOSE 169* 126* 90  BUN 26* 31* 27*  CREATININE 0.69 0.65 0.82  CALCIUM 9.1 9.2 8.5*  MG 2.0  --   --     Liver Function Tests:  Recent Labs Lab 10/04/16 1725 10/06/16 0417  AST 17 11*  ALT 11* 12*  ALKPHOS 126 105  BILITOT 0.8 0.4  PROT 7.6 6.0*  ALBUMIN 3.2* 2.3*    CBC:  Recent Labs Lab 10/05/16 0458 10/06/16 0417 10/08/16 0454  WBC 21.7* 15.0* 14.1*  HGB 10.7* 10.5* 11.2*  HCT 34.0* 33.5* 35.8*  MCV 87.9 87.9 89.5  PLT 326 318 391    Cardiac Enzymes:  Recent Labs Lab 10/04/16 2328 10/05/16 0458 10/05/16 1034  TROPONINI 0.05* 0.07* 0.06*    BNP: No results for input(s): PROBNP in the last 8760 hours.  Coagulation: No results for input(s): INR in the last 168 hours.  ECG:   Medications:   Scheduled Medications: .  aspirin EC  81 mg Oral Daily  . cefTRIAXone (ROCEPHIN)  IV  1 g Intravenous Q24H  . enoxaparin (LOVENOX) injection  40 mg Subcutaneous Q24H  . feeding supplement (ENSURE ENLIVE)  237 mL Oral BID BM  . furosemide  40 mg Oral Daily  . guaiFENesin  1,200 mg Oral BID  . ipratropium  0.5 mg Nebulization TID  . levalbuterol  0.63 mg Nebulization TID  . lisinopril  2.5 mg Oral Daily  . mouth rinse  15 mL Mouth Rinse BID  . metoprolol succinate  25 mg Oral Daily  . potassium chloride SA  20 mEq Oral Once per day on Mon Wed Fri  . predniSONE  20 mg Oral Q breakfast     Infusions:   PRN Medications:  albuterol, clonazePAM, HYDROcodone-acetaminophen, polyethylene glycol     Assessment/Plan    1. Acute  systolic HF - chronicity remains unclear. She reports being told about CHF in the past. Echo from [place] 2013 normal function.  She thinks perhaps her pcp had done an echo after that - echo this admit LVEF 35-40%. Negative 1.4 liters yesterday. Mild uptrend in Cr. She is on lasix PO 40mg  daily. I would lower her to 20mg  daily given her uptrending Cr on this dose. Let her know ok to take additional 20mg  at home for increased weight gain or edema - medical therapy with lisinopril 2.5mg  and Toprol 25mg  daily. We will increase Toprol XL to 37.5mg  daily, further titration as outaptient. Can discuss possible ischemic evaluation as outpatient. Mild trop of 0.07 in setting of CHF and pneumonia, do not see strong indication for inpatient ischemic testing at this time.    2. Pneumonia - per primary team  3. NSVT - short runs at times, also with some PSVT - increase Toprol XL to 37.5mg  daily.    No additional recs at this time. We will arrange outpatient f/u in 1-2 weeks in our clinic. We will sign off inpatient care.    Carlyle Dolly, M.D.

## 2016-10-11 NOTE — Care Management Important Message (Signed)
Important Message  Patient Details  Name: Hannah Mckee MRN: [id number] Date of Birth: May 12, 1931   Medicare Important Message Given:  Yes    Sherald Barge, RN 10/11/2016, 10:42 AM

## 2016-10-11 NOTE — Discharge Instructions (Signed)
Heart Failure °Heart failure is a condition in which the heart has trouble pumping blood because it has become weak or stiff. This means that the heart does not pump blood efficiently for the body to work well. For some people with heart failure, fluid may back up into the lungs and there may be swelling (edema) in the lower legs. Heart failure is usually a long-term (chronic) condition. It is important for you to take good care of yourself and follow the treatment plan from your health care provider. °What are the causes? °This condition is caused by some health problems, including: °· High blood pressure (hypertension). Hypertension causes the heart muscle to work harder than normal. High blood pressure eventually causes the heart to become stiff and weak. °· Coronary artery disease (CAD). CAD is the buildup of cholesterol and fat (plaques) in the arteries of the heart. °· Heart attack (myocardial infarction). Injured tissue, which is caused by the heart attack, does not contract as well and the heart's ability to pump blood is weakened. °· Abnormal heart valves. When the heart valves do not open and close properly, the heart muscle must pump harder to keep the blood flowing. °· Heart muscle disease (cardiomyopathy or myocarditis). Heart muscle disease is damage to the heart muscle from a variety of causes, such as drug or alcohol abuse, infections, or unknown causes. These can increase the risk of heart failure. °· Lung disease. When the lungs do not work properly, the heart must work harder. ° °What increases the risk? °Risk of heart failure increases as a person ages. This condition is also more likely to develop in people who: °· Are overweight. °· Are female. °· Smoke or chew tobacco. °· Abuse alcohol or illegal drugs. °· Have taken medicines that can damage the heart, such as chemotherapy drugs. °· Have diabetes. °? High blood sugar (glucose) is associated with high fat (lipid) levels in the blood. °? Diabetes  can also damage tiny blood vessels that carry nutrients to the heart muscle. °· Have abnormal heart rhythms. °· Have thyroid problems. °· Have low blood counts (anemia). ° °What are the signs or symptoms? °Symptoms of this condition include: °· Shortness of breath with activity, such as when climbing stairs. °· Persistent cough. °· Swelling of the feet, ankles, legs, or abdomen. °· Unexplained weight gain. °· Difficulty breathing when lying flat (orthopnea). °· Waking from sleep because of the need to sit up and get more air. °· Rapid heartbeat. °· Fatigue and loss of energy. °· Feeling light-headed, dizzy, or close to fainting. °· Loss of appetite. °· Nausea. °· Increased urination during the night (nocturia). °· Confusion. ° °How is this diagnosed? °This condition is diagnosed based on: °· Medical history, symptoms, and a physical exam. °· Diagnostic tests, which may include: °? Echocardiogram. °? Electrocardiogram (ECG). °? Chest X-ray. °? Blood tests. °? Exercise stress test. °? Radionuclide scans. °? Cardiac catheterization and angiogram. ° °How is this treated? °Treatment for this condition is aimed at managing the symptoms of heart failure. Medicines, behavioral changes, or other treatments may be necessary to treat heart failure. °Medicines °These may include: °· Angiotensin-converting enzyme (ACE) inhibitors. This type of medicine blocks the effects of a blood protein called angiotensin-converting enzyme. ACE inhibitors relax (dilate) the blood vessels and help to lower blood pressure. °· Angiotensin receptor blockers (ARBs). This type of medicine blocks the actions of a blood protein called angiotensin. ARBs dilate the blood vessels and help to lower blood pressure. °· Water   pills (diuretics). Diuretics cause the kidneys to remove salt and water from the blood. The extra fluid is removed through urination, leaving a lower volume of blood that the heart has to pump. °· Beta blockers. These improve heart  muscle strength and they prevent the heart from beating too quickly. °· Digoxin. This increases the force of the heartbeat. ° °Healthy behavior changes °These may include: °· Reaching and maintaining a healthy weight. °· Stopping smoking or chewing tobacco. °· Eating heart-healthy foods. °· Limiting or avoiding alcohol. °· Stopping use of street drugs (illegal drugs). °· Physical activity. ° °Other treatments °These may include: °· Surgery to open blocked coronary arteries or repair damaged heart valves. °· Placement of a biventricular pacemaker to improve heart muscle function (cardiac resynchronization therapy). This device paces both the right ventricle and left ventricle. °· Placement of a device to treat serious abnormal heart rhythms (implantable cardioverter defibrillator, or ICD). °· Placement of a device to improve the pumping ability of the heart (left ventricular assist device, or LVAD). °· Heart transplant. This can cure heart failure, and it is considered for certain patients who do not improve with other therapies. ° °Follow these instructions at home: °Medicines °· Take over-the-counter and prescription medicines only as told by your health care provider. Medicines are important in reducing the workload of your heart, slowing the progression of heart failure, and improving your symptoms. °? Do not stop taking your medicine unless your health care provider told you to do that. °? Do not skip any dose of medicine. °? Refill your prescriptions before you run out of medicine. You need your medicines every day. °Eating and drinking ° °· Eat heart-healthy foods. Talk with a dietitian to make an eating plan that is right for you. °? Choose foods that contain no trans fat and are low in saturated fat and cholesterol. Healthy choices include fresh or frozen fruits and vegetables, fish, lean meats, legumes, fat-free or low-fat dairy products, and whole-grain or high-fiber foods. °? Limit salt (sodium) if  directed by your health care provider. Sodium restriction may reduce symptoms of heart failure. Ask a dietitian to recommend heart-healthy seasonings. °? Use healthy cooking methods instead of frying. Healthy methods include roasting, grilling, broiling, baking, poaching, steaming, and stir-frying. °· Limit your fluid intake if directed by your health care provider. Fluid restriction may reduce symptoms of heart failure. °Lifestyle °· Stop smoking or using chewing tobacco. Nicotine and tobacco can damage your heart and your blood vessels. Do not use nicotine gum or patches before talking to your health care provider. °· Limit alcohol intake to no more than 1 drink per day for non-pregnant women and 2 drinks per day for men. One drink equals 12 oz of beer, 5 oz of wine, or 1½ oz of hard liquor. °? Drinking more than that is harmful to your heart. Tell your health care provider if you drink alcohol several times a week. °? Talk with your health care provider about whether any level of alcohol use is safe for you. °? If your heart has already been damaged by alcohol or you have severe heart failure, drinking alcohol should be stopped completely. °· Stop use of illegal drugs. °· Lose weight if directed by your health care provider. Weight loss may reduce symptoms of heart failure. °· Do moderate physical activity if directed by your health care provider. People who are elderly and people with severe heart failure should consult with a health care provider for physical activity recommendations. °  Monitor important information °· Weigh yourself every day. Keeping track of your weight daily helps you to notice excess fluid sooner. °? Weigh yourself every morning after you urinate and before you eat breakfast. °? Wear the same amount of clothing each time you weigh yourself. °? Record your daily weight. Provide your health care provider with your weight record. °· Monitor and record your blood pressure as told by your health  care provider. °· Check your pulse as told by your health care provider. °Dealing with extreme temperatures °· If the weather is extremely hot: °? Avoid vigorous physical activity. °? Use air conditioning or fans or seek a cooler location. °? Avoid caffeine and alcohol. °? Wear loose-fitting, lightweight, and light-colored clothing. °· If the weather is extremely cold: °? Avoid vigorous physical activity. °? Layer your clothes. °? Wear mittens or gloves, a hat, and a scarf when you go outside. °? Avoid alcohol. °General instructions °· Manage other health conditions such as hypertension, diabetes, thyroid disease, or abnormal heart rhythms as told by your health care provider. °· Learn to manage stress. If you need help to do this, ask your health care provider. °· Plan rest periods when fatigued. °· Get ongoing education and support as needed. °· Participate in or seek rehabilitation as needed to maintain or improve independence and quality of life. °· Stay up to date with immunizations. Keeping current on pneumococcal and influenza immunizations is especially important to prevent respiratory infections. °· Keep all follow-up visits as told by your health care provider. This is important. °Contact a health care provider if: °· You have a rapid weight gain. °· You have increasing shortness of breath that is unusual for you. °· You are unable to participate in your usual physical activities. °· You tire easily. °· You cough more than normal, especially with physical activity. °· You have any swelling or more swelling in areas such as your hands, feet, ankles, or abdomen. °· You are unable to sleep because it is hard to breathe. °· You feel like your heart is beating quickly (palpitations). °· You become dizzy or light-headed when you stand up. °Get help right away if: °· You have difficulty breathing. °· You notice or your family notices a change in your awareness, such as having trouble staying awake or having  difficulty with concentration. °· You have pain or discomfort in your chest. °· You have an episode of fainting (syncope). °This information is not intended to replace advice given to you by your health care provider. Make sure you discuss any questions you have with your health care provider. °Document Released: 10/18/2005 Document Revised: 06/22/2016 Document Reviewed: 05/12/2016 °[employer] Patient Education © 2017 [employer] ° °

## 2016-10-11 NOTE — Discharge Summary (Addendum)
Physician Discharge Summary  Hannah Mckee [id number] DOB: Dec 29, 1930 DOA: 10/04/2016  PCP: Glenda Chroman, MD  Admit date: 10/04/2016 Discharge date: 10/11/2016  Admitted From: Home Disposition:  Home with home health  Recommendations for Outpatient Follow-up:  1. Follow up with PCP in 1-2 weeks. Patient will complete 7 day course of oral Diflucan on 12/19. 2. Follow-up with cardiology on 12/21 at 3:30 PM. 3. Please monitor repeat TSH and free T3-T4 in about 4 weeks.  Home Health: RN and PT Equipment/Devices: Oxygen (2 L via nasal cannula with ambulation)  Discharge Condition: Fair CODE STATUS: Full code Diet recommendation: Heart Healthy    Discharge Diagnoses:  Principal Problem:   Acute respiratory failure with hypoxia ([place])   Active Problems:   CAP (community acquired pneumonia)   Sepsis ([place])   Elevated troponin   Acute systolic CHF (congestive heart failure) (HCC)   Hypokalemia   Moderate malnutrition (HCC)   Muscle weakness (generalized)   Sinus tachycardia   Lobar pneumonia, unspecified organism ([place])   COPD exacerbation ([place])   ? Hyperthyroidism  Brief narrative/history of present illness 82 year old female with hypertension, COPD not on home O2, history of CHF presented with flulike symptoms with productive cough and shortness of breath with associated orthopnea. Patient found to be in acute hypoxic respiratory failure secondary to lobar pneumonia and acute systolic CHF along with COPD exacerbation. Admitted to stepdown unit. Pulmonary and cardiology following in consultation.   Hospital course Acute respiratory failure with hypoxia (HCC) - Combination of lobar pneumonia, acute systolic CHF and COPD exacerbation. Patient initially require stepdown stay, and she was transferred to the floor on 12/8. - And was much improved with diuretic, steroid, oxygen and nebulizer. Patient desaturates to the 80s on ambulation and will need oxygen (2 L continuously with  ambulation upon discharge). -Seen by physical therapy and recommended home health.  Lobar pneumonia ([place]) - Symptoms much improved. Completed 7 day course of antibiotics. Supportive care with Tylenol and antitussives. - Blood cultures negative. Flu PCR and strep urine pneumonia antigen have been negative. -Sputum culture growing moderate Candida albicans and Candida [place]. Will discharge on 7 day course of Diflucan.  Acute systolic CHF (congestive heart failure) (HCC) - EF of 35-40% - Cardiology consult appreciated. Improved with IV Lasix, added beta blocker and low-dose ACE inhibitor. -Appears euvolemic. Will discharge on Toprol, ACE inhibitor, Lasix and aspirin. Has follow-up with cardiology in 10 days (planned for outpatient Lexiscan Myoview to rule out ischemia.)  ?hyperthyroidism - Decreased TSH and elevated free T4 and T3. Given her acute illness would recommend repeating thyroid function in 4 weeks. Heart rate has improved with adjustment of beta blocker.  COPD (chronic obstructive pulmonary disease) ([place]) - With acute exacerbation.  Improved with IV Solu-Medrol and nebs. Prednisone dose tapered and will discharge on 20 mg by mouth daily for next 5 days. Completed antibiotics. Resume home inhalers and nebulizer.  Elevated troponin - Possibly demand ischemia with acute symptoms. However given associated acute systolic dysfunction dose plan on outpatient nuclear stress test to rule out ischemia.   Sinus tachycardia - improved with increasing dose of beta blocker, switching patient to Xopenex. Her TSH is low with mildly elevated free T4. Given her acute illness should have her TSH and free T4 repeated in next 4 weeks.   Hypokalemia - Replenished  Moderate protein calorie malnutrition (HCC) - Added supplement.  Generalized weakness - Seen by physical therapy and recommended home health with supervision. Patient will be going home with  her son (who will be staying with  her for bout a week)    Family Communication: Spoke with daughter while in the hospital Disposition Plan:  home with home health  Consultants:   Pulmonary  Cardiology   Procedures:   2D echo Impressions: - Mild LVH with LVEF approximately 35-40% in the setting oftachycardia. There is diffuse hypokinesis, most severe in thebasal inferior wall. Probable grade 2 diastolic dysfunction.Severe left atrial enlargement. Calcified mitral annulus withmild to moderate mitral regurgitation. Moderately calcifiedaortic annulus. Moderate tricuspid regurgitation with PASP 73mmHg.  Antimicrobials:  Ceftriaxone 12/4 >>12/10  Azithromycin 12/5 >>12/10  Doxycycline 1, 12/4  Discharge Instructions     Medication List    TAKE these medications   aspirin EC 81 MG tablet Take 81 mg by mouth daily.   clonazePAM 0.5 MG tablet Commonly known as:  KLONOPIN Take 0.125 mg by mouth daily as needed (FOR BP LEVELS).   feeding supplement (ENSURE ENLIVE) Liqd Take 237 mLs by mouth 2 (two) times daily between meals.   furosemide 40 MG tablet Commonly known as:  LASIX Take 0.5 tablets (20 mg total) by mouth daily. What changed:  when to take this   guaiFENesin 600 MG 12 hr tablet Commonly known as:  MUCINEX Take 2 tablets (1,200 mg total) by mouth 2 (two) times daily.   HYDROcodone-acetaminophen 5-325 MG tablet Commonly known as:  NORCO/VICODIN Take 0.5-1 tablets by mouth daily as needed for moderate pain.   ipratropium-albuterol 0.5-2.5 (3) MG/3ML Soln Commonly known as:  DUONEB Take 3 mLs by nebulization every 4 (four) hours as needed.   lisinopril 2.5 MG tablet Commonly known as:  PRINIVIL,ZESTRIL Take 1 tablet (2.5 mg total) by mouth daily. Start taking on:  10/12/2016   metoprolol succinate 25 MG 24 hr tablet Commonly known as:  TOPROL-XL Take 1.5 tablets (37.5 mg total) by mouth daily. Start taking on:  10/12/2016   polyethylene glycol packet Commonly known as:   MIRALAX / GLYCOLAX Take 17 g by mouth 2 (two) times daily. What changed:  when to take this  reasons to take this   potassium chloride SA 20 MEQ tablet Commonly known as:  K-DUR,KLOR-CON Take 20 mEq by mouth 3 (three) times a week. Takes only when Furosemide is taken   predniSONE 20 MG tablet Commonly known as:  DELTASONE Take 1 tablet (20 mg total) by mouth daily with breakfast. Start taking on:  10/12/2016   PROAIR HFA 108 (90 Base) MCG/ACT inhaler Generic drug:  albuterol Inhale 1-2 puffs into the lungs every 6 (six) hours as needed for shortness of breath.            Durable Medical Equipment        Start     Ordered   10/11/16 1043  For home use only DME oxygen  Once    Question Answer Comment  Mode or (Route) Nasal cannula   Liters per Minute 2   Frequency Continuous (stationary and portable oxygen unit needed)   Oxygen conserving device Yes   Oxygen delivery system Gas      10/11/16 1042     Follow-up Information    [facility] Follow up.   Contact information: [place] [address] [phone]        Jory Sims, NP Follow up on 10/21/2016.   Specialties:  Nurse Practitioner, Radiology, Cardiology Why:  at 3:30 pm Contact information: [place] [address] [phone]  Glenda Chroman, MD. Schedule an appointment as soon as possible for a visit in 1 week(s).   Specialty:  Internal Medicine Contact information: [place] [address] [phone]          Allergies  Allergen Reactions  . Erythromycin Swelling  . Levaquin [Levofloxacin] Other (See Comments)    Tongue turns red and sore  . Penicillins Swelling    Has patient had a PCN reaction causing immediate rash, facial/tongue/throat swelling, SOB or lightheadedness with hypotension: Yes Has patient had a PCN reaction causing severe rash involving mucus membranes or skin necrosis: No Has patient had a PCN  reaction that required hospitalization No Has patient had a PCN reaction occurring within the last 10 years: No If all of the above answers are "NO", then may proceed with Cephalosporin use.   . Sulfa Antibiotics Other (See Comments)    Tongue turns red and sore      Procedures/Studies: Dg Chest 2 View  Result Date: 10/04/2016 CLINICAL DATA:  Shortness of breath . EXAM: CHEST  2 VIEW COMPARISON:  10/28/2014. FINDINGS: Mediastinum hilar structures normal. Cardiomegaly with mild bibasilar infiltrates small pleural effusions suggesting congestive heart failure. Low lung volumes. No pneumothorax. Thoracic spine kyphosis and diffuse osteopenia. IMPRESSION: 1. Cardiomegaly. Mild mild basilar infiltrates and/or edema with small pleural effusions. Congestive heart failure cannot be excluded. 2. Low lung volumes. Electronically Signed   By: Marcello Moores  Register   On: 10/04/2016 16:00   Dg Chest Port 1 View  Result Date: 10/07/2016 CLINICAL DATA:  Pneumonia short of breath EXAM: PORTABLE CHEST 1 VIEW COMPARISON:  10/04/2016 FINDINGS: Progression of bibasilar airspace disease. Progression of pulmonary vascular congestion and bilateral effusions which may be due to heart failure. IMPRESSION: Progression of vascular congestion and bilateral airspace disease most suggestive of pulmonary edema and bilateral effusions. Bibasilar airspace disease may be atelectasis or pneumonia. This has progressed in the interval. Electronically Signed   By: Franchot Gallo M.D.   On: 10/07/2016 09:04       Subjective: Feels much better. Shortness of breath improved  Discharge Exam: Vitals:   10/10/16 2300 10/11/16 0612  BP: (!) 119/51 (!) 128/53  Pulse: 89 75  Resp: 18 16  Temp: 98.2 F (36.8 C) 98.1 F (36.7 C)   Vitals:   10/10/16 1950 10/10/16 2300 10/11/16 0612 10/11/16 0741  BP:  (!) 119/51 (!) 128/53   Pulse: 83 89 75   Resp: 18 18 16    Temp:  98.2 F (36.8 C) 98.1 F (36.7 C)   TempSrc:  Oral Oral    SpO2: 97% 99% 98% 99%  Weight:   47.7 kg (105 lb 1.6 oz)   Height:        General:Elderly thin built female not in distress HEENT: Moist mucosa, supple neck Cardiovascular: RRR, S1/S2 +, no rubs, no gallops Respiratory: Clear breath sounds bilaterally Abdominal: Soft, NT, ND, bowel sounds + Extremities: Warm, no edema  CNS: Alert and oriented    The results of significant diagnostics from this hospitalization (including imaging, microbiology, ancillary and laboratory) are listed below for reference.     Microbiology: Recent Results (from the past 240 hour(s))  Urine culture     Status: Abnormal   Collection Time: 10/04/16  7:28 PM  Result Value Ref Range Status   Specimen Description URINE, CLEAN CATCH  Final   Special Requests NONE  Final   Culture MULTIPLE SPECIES PRESENT, SUGGEST RECOLLECTION (A)  Final   Report Status 10/06/2016  FINAL  Final  Blood Culture (routine x 2)     Status: None   Collection Time: 10/04/16  8:40 PM  Result Value Ref Range Status   Specimen Description LEFT ANTECUBITAL  Final   Special Requests BOTTLES DRAWN AEROBIC AND ANAEROBIC 6CC  Final   Culture NO GROWTH 5 DAYS  Final   Report Status 10/09/2016 FINAL  Final  Blood Culture (routine x 2)     Status: None   Collection Time: 10/04/16  8:44 PM  Result Value Ref Range Status   Specimen Description BLOOD LEFT HAND  Final   Special Requests BOTTLES DRAWN AEROBIC AND ANAEROBIC 6CC  Final   Culture NO GROWTH 5 DAYS  Final   Report Status 10/09/2016 FINAL  Final  MRSA PCR Screening     Status: None   Collection Time: 10/04/16 11:20 PM  Result Value Ref Range Status   MRSA by PCR NEGATIVE NEGATIVE Final    Comment:        The GeneXpert MRSA Assay (FDA approved for NASAL specimens only), is one component of a comprehensive MRSA colonization surveillance program. It is not intended to diagnose MRSA infection nor to guide or monitor treatment for MRSA infections.   Culture,  sputum-assessment     Status: None   Collection Time: 10/05/16  9:00 PM  Result Value Ref Range Status   Specimen Description SPUTUM  Final   Special Requests Normal  Final   Sputum evaluation   Final    THIS SPECIMEN IS ACCEPTABLE. RESPIRATORY CULTURE REPORT TO FOLLOW. PERFORMED AT [facility]    Report Status 10/05/2016 FINAL  Final  Culture, respiratory (NON-Expectorated)     Status: None   Collection Time: 10/05/16  9:00 PM  Result Value Ref Range Status   Specimen Description SPUTUM  Final   Special Requests NONE  Final   Gram Stain   Final    NO WBC SEEN FEW YEAST RARE GRAM POSITIVE RODS Performed at [facility]    Culture   Final    [place] GLABRATA    Report Status 10/09/2016 FINAL  Final     Labs: BNP (last 3 results)  Recent Labs  10/04/16 2044  BNP 0000000*   Basic Metabolic Panel:  Recent Labs Lab 10/06/16 0417 10/07/16 0438 10/08/16 0454 10/10/16 0630 10/11/16 0459  NA 139 139 138 137 135  K 3.7 4.3 4.0 3.9 3.9  CL 106 104 98* 95* 96*  CO2 27 30 34* 34* 34*  GLUCOSE 174* 146* 169* 126* 90  BUN 13 19 26* 31* 27*  CREATININE 0.50 0.61 0.69 0.65 0.82  CALCIUM 9.0 9.0 9.1 9.2 8.5*  MG  --   --  2.0  --   --    Liver Function Tests:  Recent Labs Lab 10/04/16 1725 10/06/16 0417  AST 17 11*  ALT 11* 12*  ALKPHOS 126 105  BILITOT 0.8 0.4  PROT 7.6 6.0*  ALBUMIN 3.2* 2.3*   No results for input(s): LIPASE, AMYLASE in the last 168 hours. No results for input(s): AMMONIA in the last 168 hours. CBC:  Recent Labs Lab 10/04/16 1725 10/05/16 0458 10/06/16 0417 10/08/16 0454  WBC 19.7* 21.7* 15.0* 14.1*  NEUTROABS 17.1* 18.9*  --   --   HGB 12.2 10.7* 10.5* 11.2*  HCT 38.0 34.0* 33.5* 35.8*  MCV 88.2 87.9 87.9 89.5  PLT 368 326 318 391   Cardiac Enzymes:  Recent Labs Lab 10/04/16 1725 10/04/16 2040 10/04/16  2328 10/05/16 0458 10/05/16 1034  TROPONINI 0.04* 0.04* 0.05* 0.07* 0.06*    BNP: Invalid input(s): POCBNP CBG: No results for input(s): GLUCAP in the last 168 hours. D-Dimer No results for input(s): DDIMER in the last 72 hours. Hgb A1c No results for input(s): HGBA1C in the last 72 hours. Lipid Profile No results for input(s): CHOL, HDL, LDLCALC, TRIG, CHOLHDL, LDLDIRECT in the last 72 hours. Thyroid function studies  Recent Labs  10/10/16 0630  T3FREE 1.9*   Anemia work up No results for input(s): VITAMINB12, FOLATE, FERRITIN, TIBC, IRON, RETICCTPCT in the last 72 hours. Urinalysis    Component Value Date/Time   COLORURINE YELLOW 10/04/2016 1928   APPEARANCEUR CLEAR 10/04/2016 1928   LABSPEC 1.020 10/04/2016 1928   PHURINE 6.0 10/04/2016 1928   GLUCOSEU NEGATIVE 10/04/2016 1928   HGBUR LARGE (A) 10/04/2016 1928   BILIRUBINUR SMALL (A) 10/04/2016 1928   KETONESUR >80 (A) 10/04/2016 1928   PROTEINUR 100 (A) 10/04/2016 1928   NITRITE NEGATIVE 10/04/2016 1928   LEUKOCYTESUR TRACE (A) 10/04/2016 1928   Sepsis Labs Invalid input(s): PROCALCITONIN,  WBC,  LACTICIDVEN Microbiology Recent Results (from the past 240 hour(s))  Urine culture     Status: Abnormal   Collection Time: 10/04/16  7:28 PM  Result Value Ref Range Status   Specimen Description URINE, CLEAN CATCH  Final   Special Requests NONE  Final   Culture MULTIPLE SPECIES PRESENT, SUGGEST RECOLLECTION (A)  Final   Report Status 10/06/2016 FINAL  Final  Blood Culture (routine x 2)     Status: None   Collection Time: 10/04/16  8:40 PM  Result Value Ref Range Status   Specimen Description LEFT ANTECUBITAL  Final   Special Requests BOTTLES DRAWN AEROBIC AND ANAEROBIC 6CC  Final   Culture NO GROWTH 5 DAYS  Final   Report Status 10/09/2016 FINAL  Final  Blood Culture (routine x 2)     Status: None   Collection Time: 10/04/16  8:44 PM  Result Value Ref Range Status   Specimen Description BLOOD LEFT HAND  Final   Special Requests BOTTLES DRAWN AEROBIC AND ANAEROBIC 6CC  Final   Culture  NO GROWTH 5 DAYS  Final   Report Status 10/09/2016 FINAL  Final  MRSA PCR Screening     Status: None   Collection Time: 10/04/16 11:20 PM  Result Value Ref Range Status   MRSA by PCR NEGATIVE NEGATIVE Final    Comment:        The GeneXpert MRSA Assay (FDA approved for NASAL specimens only), is one component of a comprehensive MRSA colonization surveillance program. It is not intended to diagnose MRSA infection nor to guide or monitor treatment for MRSA infections.   Culture, sputum-assessment     Status: None   Collection Time: 10/05/16  9:00 PM  Result Value Ref Range Status   Specimen Description SPUTUM  Final   Special Requests Normal  Final   Sputum evaluation   Final    THIS SPECIMEN IS ACCEPTABLE. RESPIRATORY CULTURE REPORT TO FOLLOW. PERFORMED AT [facility]    Report Status 10/05/2016 FINAL  Final  Culture, respiratory (NON-Expectorated)     Status: None   Collection Time: 10/05/16  9:00 PM  Result Value Ref Range Status   Specimen Description SPUTUM  Final   Special Requests NONE  Final   Gram Stain   Final    NO WBC SEEN FEW YEAST RARE GRAM POSITIVE RODS Performed at [facility]    Culture  Final    MODERATE CANDIDA ALBICANS MODERATE CANDIDA GLABRATA    Report Status 10/09/2016 FINAL  Final     Time coordinating discharge: Over 30 minutes  SIGNED:   Louellen Molder, MD  [facility] 10/11/2016, 12:13 PM Pager   If 7PM-7AM, please contact night-coverage [email or web address] Password [email or web address]

## 2016-10-11 NOTE — Progress Notes (Signed)
She hopes to go home today. She does need home oxygen. Per her request I will plan to have her get an appointment at my office for follow-up

## 2016-10-11 NOTE — Care Management Note (Signed)
Case Management Note  Patient Details  Name: Hannah Mckee MRN: [id number] Date of Birth: [birth year]  Expected Discharge Date:    10/11/2016              Expected Discharge Plan:  [place]  In-House Referral:  NA  Discharge planning Services  CM Consult  Post Acute Care Choice:  Home Health, Durable Medical Equipment Choice offered to:  Patient  DME Arranged:  Oxygen DME Agency:  [place] Arranged:  RN, PT [facility] Agency:  [facility]  Status of Service:  Completed, signed off   Additional Comments: Pt discharging home today with [facility] services through [facility]. Pt is aware HH has 48 hrs to make first visit. Pt meet requirements for supplemental oxygen. will Pt has chosen [facility] from list of DME providers. Romualdo Bolk, of [facility] aware of referral and DC today and will obtain pt info from chart and deliver port tanks to pt room prior to DC. DC plan discussed with pt's daughter over phone with pt's consent. All parties in agreement to DC plan.   Sherald Barge, RN 10/11/2016, 10:43 AM

## 2016-10-11 NOTE — Progress Notes (Signed)
SATURATION QUALIFICATIONS: (This note is used to comply with regulatory documentation for home oxygen)  Patient Saturations on Room Air at Rest = 96%  Patient Saturations on Room Air while Ambulating = 86%  Patient Saturations on 2 Liters of oxygen while Ambulating = 97%  Please briefly explain why patient needs home oxygen: Pt oxygen saturations drop while ambulating on room air.

## 2016-10-11 NOTE — Progress Notes (Signed)
[name] discharged Home per MD order.  Discharge instructions reviewed and discussed with the patient, all questions and concerns answered. Copy of instructions and scripts given to patient.    Medication List    TAKE these medications   aspirin EC 81 MG tablet Take 81 mg by mouth daily.   clonazePAM 0.5 MG tablet Commonly known as:  KLONOPIN Take 0.125 mg by mouth daily as needed (FOR BP LEVELS).   feeding supplement (ENSURE ENLIVE) Liqd Take 237 mLs by mouth 2 (two) times daily between meals.   fluconazole 100 MG tablet Commonly known as:  DIFLUCAN Take 1 tablet (100 mg total) by mouth daily.   furosemide 40 MG tablet Commonly known as:  LASIX Take 0.5 tablets (20 mg total) by mouth daily. What changed:  when to take this   guaiFENesin 600 MG 12 hr tablet Commonly known as:  MUCINEX Take 2 tablets (1,200 mg total) by mouth 2 (two) times daily.   HYDROcodone-acetaminophen 5-325 MG tablet Commonly known as:  NORCO/VICODIN Take 0.5-1 tablets by mouth daily as needed for moderate pain.   ipratropium-albuterol 0.5-2.5 (3) MG/3ML Soln Commonly known as:  DUONEB Take 3 mLs by nebulization every 4 (four) hours as needed.   lisinopril 2.5 MG tablet Commonly known as:  PRINIVIL,ZESTRIL Take 1 tablet (2.5 mg total) by mouth daily. Start taking on:  10/12/2016   metoprolol succinate 25 MG 24 hr tablet Commonly known as:  TOPROL-XL Take 1.5 tablets (37.5 mg total) by mouth daily. Start taking on:  10/12/2016   polyethylene glycol packet Commonly known as:  MIRALAX / GLYCOLAX Take 17 g by mouth 2 (two) times daily. What changed:  when to take this  reasons to take this   potassium chloride SA 20 MEQ tablet Commonly known as:  K-DUR,KLOR-CON Take 20 mEq by mouth 3 (three) times a week. Takes only when Furosemide is taken   predniSONE 20 MG tablet Commonly known as:  DELTASONE Take 1 tablet (20 mg total) by mouth daily with breakfast. Start taking on:   10/12/2016   PROAIR HFA 108 (90 Base) MCG/ACT inhaler Generic drug:  albuterol Inhale 1-2 puffs into the lungs every 6 (six) hours as needed for shortness of breath.            Durable Medical Equipment        Start     Ordered   10/11/16 1043  For home use only DME oxygen  Once    Question Answer Comment  Mode or (Route) Nasal cannula   Liters per Minute 2   Frequency Continuous (stationary and portable oxygen unit needed)   Oxygen conserving device Yes   Oxygen delivery system Gas      10/11/16 1042      IV site discontinued and catheter remains intact. Site without signs and symptoms of complications. Dressing and pressure applied.  Patient escorted to car by NT in a wheelchair,  no distress noted upon discharge.  Hannah Mckee 10/11/2016 3:10 PM

## 2016-10-21 ENCOUNTER — Ambulatory Visit (INDEPENDENT_AMBULATORY_CARE_PROVIDER_SITE_OTHER): Payer: Medicare Other | Admitting: Adult Health

## 2016-10-21 ENCOUNTER — Encounter: Payer: Self-pay | Admitting: Adult Health

## 2016-10-21 VITALS — BP 142/84 | HR 79 | Ht <= 58 in | Wt 103.0 lb

## 2016-10-21 DIAGNOSIS — I420 Dilated cardiomyopathy: Secondary | ICD-10-CM

## 2016-10-21 DIAGNOSIS — I10 Essential (primary) hypertension: Secondary | ICD-10-CM | POA: Diagnosis not present

## 2016-10-21 DIAGNOSIS — R072 Precordial pain: Secondary | ICD-10-CM

## 2016-10-21 MED ORDER — POTASSIUM CHLORIDE CRYS ER 20 MEQ PO TBCR: 20.0000 meq | EXTENDED_RELEASE_TABLET | ORAL | 3 refills | Status: DC

## 2016-10-21 MED ORDER — METOPROLOL SUCCINATE ER 25 MG PO TB24: 37.5000 mg | ORAL_TABLET | Freq: Every day | ORAL | 3 refills | Status: DC

## 2016-10-21 MED ORDER — LISINOPRIL 2.5 MG PO TABS: 2.5000 mg | ORAL_TABLET | Freq: Every day | ORAL | 3 refills | Status: DC

## 2016-10-21 MED ORDER — FUROSEMIDE 40 MG PO TABS: 20.0000 mg | ORAL_TABLET | Freq: Every day | ORAL | 3 refills | Status: DC

## 2016-10-21 NOTE — Progress Notes (Signed)
Cardiology Office Note   Date:  10/21/2016   ID:  Hannah Mckee, DOB Nov 01, 1931, MRN [id number]  PCP:  Glenda Chroman, MD  Cardiologist: Cloria Spring, NP   Chief Complaint  Patient presents with  . Congestive Heart Failure  . Cardiomyopathy      History of Present Illness: Hannah Mckee is a 82 y.o. female who presents for ongoing assessment and management of chronic systolic heart failure, post hospitalization we saw the patient initially on consultation. The patient was diuresed IV Lasix, was found to have mildly elevated troponin in the setting of sepsis pneumonia and demand ischemia.  Left ventricle: The cavity size was normal. Wall thickness was   increased in a pattern of mild LVH. Systolic function was   moderately reduced. The estimated ejection fraction was in the   range of 35% to 40%. Diffuse hypokinesis. There is severe   hypokinesis of the basalinferior myocardium. Features are   consistent with a pseudonormal left ventricular filling pattern,   with concomitant abnormal relaxation and increased filling   pressure (grade 2 diastolic dysfunction). - Aortic valve: Moderately calcified annulus. Trileaflet. - Mitral valve: Calcified annulus. There was mild to moderate   regurgitation. ECG here on last [place] is why we didn't bring. Please come by just asked me when you get here- Left atrium: The atrium was severely dilated. - Right atrium: The atrium was mildly dilated. Central venous   pressure (est): 3 mm Hg. - Atrial septum: No defect or patent foramen ovale was identified. - Tricuspid valve: There was moderate regurgitation. - Pulmonary arteries: PA peak pressure: 50 mm Hg (S). - Pericardium, extracardiac: There was no pericardial effusion.  The patient was started on ACE inhibitor, beta blocker, Lasix 20 mg daily. She is to have a follow-up Lexiscan Myoview.  Left ventricle: The cavity size was normal. Wall thickness was   increased in a pattern of  mild LVH. Systolic function was   moderately reduced. The estimated ejection fraction was in the   range of 35% to 40%. Diffuse hypokinesis. There is severe   hypokinesis of the basalinferior myocardium. Features are   consistent with a pseudonormal left ventricular filling pattern,   with concomitant abnormal relaxation and increased filling   pressure (grade 2 diastolic dysfunction). - Aortic valve: Moderately calcified annulus. Trileaflet. - Mitral valve: Calcified annulus. There was mild to moderate   regurgitation. - Left atrium: The atrium was severely dilated. - Right atrium: The atrium was mildly dilated. Central venous   pressure (est): 3 mm Hg. - Atrial septum: No defect or patent foramen ovale was identified. - Tricuspid valve: There was moderate regurgitation. - Pulmonary arteries: PA peak pressure: 50 mm Hg (S). - Pericardium, extracardiac: There was no pericardial effusion.  She comes today with her daughter with multiple questions about her medications, diet, exercise and weight. Her daughter is very anxious about her mother's health and also asks multiple questions. She continues on home O2 and is followed by Dr. Luan Pulling for pulmonary. They are usually followed in the [facility] office for cardiology. She has had some gas pains in her upper abdomen which is concerned are cardiac related.   Past Medical History:  Diagnosis Date  . CHF (congestive heart failure) ([place])   . COPD (chronic obstructive pulmonary disease) (HCC)    no home O2  . Hypertension   . Kidney stones   . Osteoarthritis   . Sepsis [facility])     Past Surgical History:  Procedure Laterality Date  . bilateral cataract surg    . BUNIONECTOMY    . CHOLECYSTECTOMY    . COLONOSCOPY  11/16/2012   Procedure: COLONOSCOPY;  Surgeon: Rogene Houston, MD;  Location: [facility];  Service: Endoscopy;  Laterality: N/A;  100  . LITHOTRIPSY    . ORIF PERIPROSTHETIC FRACTURE Right 10/29/2014   Procedure: OPEN REDUCTION  INTERNAL FIXATION (ORIF) PERIPROSTHETIC FEMUR FRACTURE/FEMUR SHAFT;  Surgeon: Mcarthur Rossetti, MD;  Location: [place];  Service: Orthopedics;  Laterality: Right;  . TONSILLECTOMY    . TOTAL HIP ARTHROPLASTY     1989     Current Outpatient Prescriptions  Medication Sig Dispense Refill  . aspirin EC 81 MG tablet Take 81 mg by mouth daily.    . clonazePAM (KLONOPIN) 0.5 MG tablet Take 0.125 mg by mouth daily as needed (FOR BP LEVELS).    . feeding supplement, ENSURE ENLIVE, (ENSURE ENLIVE) LIQD Take 237 mLs by mouth 2 (two) times daily between meals. 237 mL 12  . furosemide (LASIX) 40 MG tablet Take 0.5 tablets (20 mg total) by mouth daily. 30 tablet 3  . guaiFENesin (MUCINEX) 600 MG 12 hr tablet Take 2 tablets (1,200 mg total) by mouth 2 (two) times daily. 10 tablet 0  . HYDROcodone-acetaminophen (NORCO/VICODIN) 5-325 MG tablet Take 0.5-1 tablets by mouth daily as needed for moderate pain.    Marland Kitchen ipratropium-albuterol (DUONEB) 0.5-2.5 (3) MG/3ML SOLN Take 3 mLs by nebulization every 4 (four) hours as needed. 360 mL   . lisinopril (PRINIVIL,ZESTRIL) 2.5 MG tablet Take 1 tablet (2.5 mg total) by mouth daily. 30 tablet 3  . metoprolol succinate (TOPROL-XL) 25 MG 24 hr tablet Take 1.5 tablets (37.5 mg total) by mouth daily. 30 tablet 3  . polyethylene glycol (MIRALAX / GLYCOLAX) packet Take 17 g by mouth 2 (two) times daily. (Patient taking differently: Take 17 g by mouth daily as needed for mild constipation. ) 14 each 0  . [START ON 10/22/2016] potassium chloride SA (K-DUR,KLOR-CON) 20 MEQ tablet Take 1 tablet (20 mEq total) by mouth 3 (three) times a week. Takes only when Furosemide is taken 30 tablet 3  . PROAIR HFA 108 (90 Base) MCG/ACT inhaler Inhale 1-2 puffs into the lungs every 6 (six) hours as needed for shortness of breath.      No current facility-administered medications for this visit.     Allergies:   Erythromycin; Levaquin [levofloxacin]; Penicillins; and Sulfa antibiotics     Social History:  The patient  reports that she has never smoked. She has never used smokeless tobacco. She reports that she does not drink alcohol or use drugs.   Family History:  The patient's family history is not on file.    ROS: All other systems are reviewed and negative. Unless otherwise mentioned in H&P    PHYSICAL EXAM: VS:  BP (!) 142/84   Pulse 79   Ht 4\' 7"  (1.397 m)   Wt 103 lb (46.7 kg)   SpO2 99%   BMI 23.94 kg/m  , BMI Body mass index is 23.94 kg/m. GEN: Well nourished, well developed, in no acute distress  HEENT: normal  Neck: no JVD, carotid bruits, or masses Cardiac: RRR; tachycardic, no murmurs, rubs, or gallops,no edema  Respiratory:  clear to auscultation bilaterally, normal work of breathing GI: soft, nontender, nondistended, + BS MS: no deformity or atrophy Significant kyphosis. Uses a walker for ambulation.  Skin: warm and dry, no rash Neuro:  Strength and sensation are intact  Psych: euthymic mood, full affect   EKG: Sinus tachycardia unchanged from prior EKG during hospitalization. Occasional PAC with baseline wondering.   Recent Labs: 10/04/2016: B Natriuretic Peptide 459.0 10/06/2016: ALT 12 10/08/2016: Hemoglobin 11.2; Magnesium 2.0; Platelets 391; TSH 0.047 10/11/2016: BUN 27; Creatinine, Ser 0.82; Potassium 3.9; Sodium 135    Lipid Panel No results found for: CHOL, TRIG, HDL, CHOLHDL, VLDL, LDLCALC, LDLDIRECT    Wt Readings from Last 3 Encounters:  10/21/16 103 lb (46.7 kg)  10/11/16 105 lb 1.6 oz (47.7 kg)  04/23/16 106 lb 6.4 oz (48.3 kg)    ASSESSMENT AND PLAN:  1. Chest Pain: As patient was leaving our office, she complained of sharp pain under her left breast. Her daughter became concerned and did not want to leave the room with her mother. We did an EKG which did not reveal any acute ST-T wave abnormalities. She was pain free by the time the EKG was completed. She was advised that if chest pain returned and became severe she was  to report to ER for further labs and evaluation.  2.Chronic Systolic CHF: Uncertain if reduced EF is ischemic related. I have offered stress test for further diagnostic and prognostic purposes, which may lead to cardiac catheterization of found to be abnormal. She and her daughter have elected to continue medical management for the next few months. I will recheck her echo in February. She will continue metoprolol 37.5 mg BID and lisinopril.She is not taking lasix daily but only 3 times a week. I have spent over 35 minutes with this patient and her daughter this visit.    3. Hypertension: BP is low at home per her monitor. We have checked is with small cuff in the office and it was found to be elevated, I rechecked it again after seeing the patient and is was found to be 128/68.   4. O2 Dependent COPD: Followed by Dr. Luan Pulling. She wishes to have him become PCP. Defer to Dr. Luan Pulling.   Current medicines are reviewed at length with the patient today.    Labs/ tests ordered today include:   Orders Placed This Encounter  Procedures  . Basic Metabolic Panel (BMET)  . EKG 12-Lead     Disposition:   FU with Dr. Harl Bowie in [place] office in one month. Go to ER if persistent chest pain.  Signed, Jory Sims, NP  10/21/2016 5:58 PM    [facility] [address], [place], [place] [address] Phone: [phone]; Fax: [phone]

## 2016-10-21 NOTE — Progress Notes (Signed)
Name: Hannah Mckee    DOB: 1931-08-23  Age: 82 y.o.  MR#: [id number]       PCP:  Glenda Chroman, MD      Insurance: Payor: [employer] MEDICARE / Plan: [facility] MEDICARE / Product Type: *No Product type* /   CC:   No chief complaint on file.   VS Vitals:   10/21/16 1529  Weight: 103 lb (46.7 kg)  Height: 4\' 7"  (1.397 m)    Weights Current Weight  10/21/16 103 lb (46.7 kg)  10/11/16 105 lb 1.6 oz (47.7 kg)  04/23/16 106 lb 6.4 oz (48.3 kg)    Blood Pressure  BP Readings from Last 3 Encounters:  10/11/16 (!) 128/53  04/23/16 154/66  10/31/14 (!) 107/38     Admit date:  (Not on file) Last encounter with RMR:  Visit date not found   Allergy Erythromycin; Levaquin [levofloxacin]; Penicillins; and Sulfa antibiotics  Current Outpatient Prescriptions  Medication Sig Dispense Refill  . aspirin EC 81 MG tablet Take 81 mg by mouth daily.    . clonazePAM (KLONOPIN) 0.5 MG tablet Take 0.125 mg by mouth daily as needed (FOR BP LEVELS).    . feeding supplement, ENSURE ENLIVE, (ENSURE ENLIVE) LIQD Take 237 mLs by mouth 2 (two) times daily between meals. 237 mL 12  . furosemide (LASIX) 40 MG tablet Take 0.5 tablets (20 mg total) by mouth daily. (Patient taking differently: Take 20 mg by mouth 3 (three) times a week. ) 30 tablet   . guaiFENesin (MUCINEX) 600 MG 12 hr tablet Take 2 tablets (1,200 mg total) by mouth 2 (two) times daily. 10 tablet 0  . HYDROcodone-acetaminophen (NORCO/VICODIN) 5-325 MG tablet Take 0.5-1 tablets by mouth daily as needed for moderate pain.    Marland Kitchen ipratropium-albuterol (DUONEB) 0.5-2.5 (3) MG/3ML SOLN Take 3 mLs by nebulization every 4 (four) hours as needed. 360 mL   . lisinopril (PRINIVIL,ZESTRIL) 2.5 MG tablet Take 1 tablet (2.5 mg total) by mouth daily. 30 tablet 0  . metoprolol succinate (TOPROL-XL) 25 MG 24 hr tablet Take 1.5 tablets (37.5 mg total) by mouth daily. 30 tablet 0  . polyethylene glycol (MIRALAX / GLYCOLAX) packet Take 17 g by mouth 2 (two) times  daily. (Patient taking differently: Take 17 g by mouth daily as needed for mild constipation. ) 14 each 0  . potassium chloride SA (K-DUR,KLOR-CON) 20 MEQ tablet Take 20 mEq by mouth 3 (three) times a week. Takes only when Furosemide is taken    . PROAIR HFA 108 (90 Base) MCG/ACT inhaler Inhale 1-2 puffs into the lungs every 6 (six) hours as needed for shortness of breath.      No current facility-administered medications for this visit.     Discontinued Meds:   There are no discontinued medications.  Patient Active Problem List   Diagnosis Date Noted  . COPD exacerbation ([place]) 10/11/2016  . Muscle weakness (generalized)   . Sinus tachycardia   . Acute systolic CHF (congestive heart failure) ([place])   . Lobar pneumonia, unspecified organism ([place])   . CAP (community acquired pneumonia) 10/04/2016  . Sepsis ([place]) 10/04/2016  . Acute respiratory failure with hypoxia ([place]) 10/04/2016  . Elevated troponin 10/04/2016  . Hypokalemia 10/04/2016  . Moderate malnutrition ([place]) 10/04/2016  . Right femoral fracture ([place]) 10/28/2014  . COPD (chronic obstructive pulmonary disease) ([place])   . Fall   . Unspecified constipation 04/16/2013  . Diverticulitis of colon without hemorrhage 04/16/2013  . Rectal bleed 11/06/2012  .  Hypertension 11/06/2012    LABS    Component Value Date/Time   NA 135 10/11/2016 0459   NA 137 10/10/2016 0630   NA 138 10/08/2016 0454   K 3.9 10/11/2016 0459   K 3.9 10/10/2016 0630   K 4.0 10/08/2016 0454   CL 96 (L) 10/11/2016 0459   CL 95 (L) 10/10/2016 0630   CL 98 (L) 10/08/2016 0454   CO2 34 (H) 10/11/2016 0459   CO2 34 (H) 10/10/2016 0630   CO2 34 (H) 10/08/2016 0454   GLUCOSE 90 10/11/2016 0459   GLUCOSE 126 (H) 10/10/2016 0630   GLUCOSE 169 (H) 10/08/2016 0454   BUN 27 (H) 10/11/2016 0459   BUN 31 (H) 10/10/2016 0630   BUN 26 (H) 10/08/2016 0454   CREATININE 0.82 10/11/2016 0459   CREATININE 0.65 10/10/2016 0630   CREATININE 0.69 10/08/2016 0454    CREATININE 0.86 01/02/2013 1233   CALCIUM 8.5 (L) 10/11/2016 0459   CALCIUM 9.2 10/10/2016 0630   CALCIUM 9.1 10/08/2016 0454   GFRNONAA >60 10/11/2016 0459   GFRNONAA >60 10/10/2016 0630   GFRNONAA >60 10/08/2016 0454   GFRAA >60 10/11/2016 0459   GFRAA >60 10/10/2016 0630   GFRAA >60 10/08/2016 0454   CMP     Component Value Date/Time   NA 135 10/11/2016 0459   K 3.9 10/11/2016 0459   CL 96 (L) 10/11/2016 0459   CO2 34 (H) 10/11/2016 0459   GLUCOSE 90 10/11/2016 0459   BUN 27 (H) 10/11/2016 0459   CREATININE 0.82 10/11/2016 0459   CREATININE 0.86 01/02/2013 1233   CALCIUM 8.5 (L) 10/11/2016 0459   PROT 6.0 (L) 10/06/2016 0417   ALBUMIN 2.3 (L) 10/06/2016 0417   AST 11 (L) 10/06/2016 0417   ALT 12 (L) 10/06/2016 0417   ALKPHOS 105 10/06/2016 0417   BILITOT 0.4 10/06/2016 0417   GFRNONAA >60 10/11/2016 0459   GFRAA >60 10/11/2016 0459       Component Value Date/Time   WBC 14.1 (H) 10/08/2016 0454   WBC 15.0 (H) 10/06/2016 0417   WBC 21.7 (H) 10/05/2016 0458   HGB 11.2 (L) 10/08/2016 0454   HGB 10.5 (L) 10/06/2016 0417   HGB 10.7 (L) 10/05/2016 0458   HCT 35.8 (L) 10/08/2016 0454   HCT 33.5 (L) 10/06/2016 0417   HCT 34.0 (L) 10/05/2016 0458   MCV 89.5 10/08/2016 0454   MCV 87.9 10/06/2016 0417   MCV 87.9 10/05/2016 0458    Lipid Panel  No results found for: CHOL, TRIG, HDL, CHOLHDL, VLDL, LDLCALC, LDLDIRECT  ABG    Component Value Date/Time   PHART 7.374 10/04/2016 2322   PCO2ART 39.6 10/04/2016 2322   PO2ART 77.9 (L) 10/04/2016 2322   HCO3 22.8 10/04/2016 2322   ACIDBASEDEF 1.9 10/04/2016 2322   O2SAT 94.9 10/04/2016 2322     Lab Results  Component Value Date   TSH 0.047 (L) 10/08/2016   BNP (last 3 results)  Recent Labs  10/04/16 2044  BNP 459.0*    ProBNP (last 3 results) No results for input(s): PROBNP in the last 8760 hours.  Cardiac Panel (last 3 results) No results for input(s): CKTOTAL, CKMB, TROPONINI, RELINDX in the last 72 hours.   Iron/TIBC/Ferritin/ %Sat No results found for: IRON, TIBC, FERRITIN, IRONPCTSAT   EKG Orders placed or performed during the hospital encounter of 10/04/16  . EKG 12-Lead  . EKG 12-Lead  . ED EKG  . ED EKG  . 12 lead EKG  . 12 lead  EKG  . EKG     Prior Assessment and Plan Problem List as of 10/21/2016 Reviewed: 10/04/2016 11:04 PM by Karmen Bongo, MD     Cardiovascular and Mediastinum   Hypertension   Acute systolic CHF (congestive heart failure) ([place])     Respiratory   COPD (chronic obstructive pulmonary disease) ([place])   CAP (community acquired pneumonia)   Acute respiratory failure with hypoxia ([place])   Lobar pneumonia, unspecified organism ([place])   COPD exacerbation ([place])     Digestive   Rectal bleed   Unspecified constipation   Diverticulitis of colon without hemorrhage     Musculoskeletal and Integument   Right femoral fracture (HCC)     Other   Moderate malnutrition (HCC)   Fall   Sepsis ([place])   Elevated troponin   Hypokalemia   Muscle weakness (generalized)   Sinus tachycardia       Imaging: Dg Chest 2 View  Result Date: 10/04/2016 CLINICAL DATA:  Shortness of breath . EXAM: CHEST  2 VIEW COMPARISON:  10/28/2014. FINDINGS: Mediastinum hilar structures normal. Cardiomegaly with mild bibasilar infiltrates small pleural effusions suggesting congestive heart failure. Low lung volumes. No pneumothorax. Thoracic spine kyphosis and diffuse osteopenia. IMPRESSION: 1. Cardiomegaly. Mild mild basilar infiltrates and/or edema with small pleural effusions. Congestive heart failure cannot be excluded. 2. Low lung volumes. Electronically Signed   By: Marcello Moores  Register   On: 10/04/2016 16:00   Dg Chest Port 1 View  Result Date: 10/07/2016 CLINICAL DATA:  Pneumonia short of breath EXAM: PORTABLE CHEST 1 VIEW COMPARISON:  10/04/2016 FINDINGS: Progression of bibasilar airspace disease. Progression of pulmonary vascular congestion and bilateral effusions which may be due to  heart failure. IMPRESSION: Progression of vascular congestion and bilateral airspace disease most suggestive of pulmonary edema and bilateral effusions. Bibasilar airspace disease may be atelectasis or pneumonia. This has progressed in the interval. Electronically Signed   By: Franchot Gallo M.D.   On: 10/07/2016 09:04

## 2016-10-21 NOTE — Patient Instructions (Addendum)
Your physician recommends that you schedule a follow-up appointment in: 1 Month with Dr. Harl Bowie   Your physician recommends that you continue on your current medications as directed. Please refer to the Current Medication list given to you today.  Your physician recommends that you return for lab work in: 1 Month just before your next visit  If you need a refill on your cardiac medications before your next appointment, please call your pharmacy.  Thank you for choosing [facility]!     Protein Content in Foods Generally, most healthy people need around 50 grams of protein each day. Depending on your overall health, you may need more or less protein in your diet. Talk to your health care provider or dietitian about how much protein you need. See the following list for the protein content of some common foods. High-protein foods High-protein foods contain 4 grams (4 g) or more of protein per serving. They include:  Beef, ground sirloin (cooked) - 3 oz have 24 g of protein.  Cheese (hard) - 1 oz has 7 g of protein.  Chicken breast, boneless and skinless (cooked) - 3 oz have 13.4 g of protein.  Cottage cheese - 1/2 cup has 13.4 g of protein.  Egg - 1 egg has 6 g of protein.  Fish, filet (cooked) - 1 oz has 6-7 g of protein.  Garbanzo beans (canned or cooked) - 1/2 cup has 6-7 g of protein.  Kidney beans (canned or cooked) - 1/2 cup has 6-7 g of protein.  Lamb (cooked) - 3 oz has 24 g of protein.  Milk - 1 cup (8 oz) has 8 g of protein.  Nuts (peanuts, pistachios, almonds) - 1 oz has 6 g of protein.  Peanut butter - 1 oz has 7-8 g of protein.  Pork tenderloin (cooked) - 3 oz has 18.4 g of protein.  Pumpkin seeds - 1 oz has 8.5 g of protein.  Soybeans (roasted) - 1 oz has 8 g of protein.  Soybeans (cooked) - 1/2 cup has 11 g of protein.  Soy milk - 1 cup (8 oz) has 5-10 g of protein.  Soy or vegetable patty - 1 patty has 11 g of protein.  Sunflower seeds - 1  oz has 5.5 g of protein.  Tofu (firm) - 1/2 cup has 20 g of protein.  Tuna (canned in water) - 3 oz has 20 g of protein.  Yogurt - 6 oz has 8 g of protein. Low-protein foods Low-protein foods contain 3 grams (3 g) or less of protein per serving. They include:  Beets (raw or cooked) - 1/2 cup has 1.5 g of protein.  Bran cereal - 1/2 cup has 2-3 g of protein.  Bread - 1 slice has 2.5 g of protein.  Broccoli (raw or cooked) - 1/2 cup has 2 g of protein.  Collard greens (raw or cooked) - 1/2 cup has 2 g of protein.  Corn (fresh or cooked) - 1/2 cup has 2 g of protein.  Cream cheese - 1 oz has 2 g of protein.  Creamer (half-and-half) - 1 oz has 1 g of protein.  Flour tortilla - 1 tortilla has 2.5 g of protein  Frozen yogurt - 1/2 cup has 3 g of protein.  Fruit or vegetable juice - 1/2 cup has 1 g of protein.  Green beans (raw or cooked) - 1/2 cup has 1 g of protein.  Green peas (canned) - 1/2 cup has 3.5 g of protein.  Muffins - 1 small muffin (2 oz) has 3 g of protein.  Oatmeal (cooked) - 1/2 cup has 3 g of protein.  Potato (baked with skin) - 1 medium potato has 3 g of protein.  Rice (cooked) - 1/2 cup has 2.5-3.5 g of protein.  Sour cream - 1/2 cup has 2.5 g of protein.  Spinach (cooked) - 1/2 cup has 3 g of protein.  Squash (cooked) - 1/2 cup has 1.5 g of protein. Actual amounts of protein may be different depending on processing. Talk with your health care provider or dietitian about what foods are recommended for you. This information is not intended to replace advice given to you by your health care provider. Make sure you discuss any questions you have with your health care provider. Document Released: 01/17/2016 Document Revised: 06/28/2016 Document Reviewed: 06/28/2016 Elsevier Interactive Patient Education  2017 [employer].

## 2016-12-01 ENCOUNTER — Encounter: Payer: Self-pay | Admitting: Cardiology

## 2016-12-01 ENCOUNTER — Ambulatory Visit (INDEPENDENT_AMBULATORY_CARE_PROVIDER_SITE_OTHER): Payer: Medicare Other | Admitting: Cardiology

## 2016-12-01 VITALS — BP 145/73 | HR 98 | Ht <= 58 in | Wt 108.8 lb

## 2016-12-01 DIAGNOSIS — I10 Essential (primary) hypertension: Secondary | ICD-10-CM

## 2016-12-01 DIAGNOSIS — I5022 Chronic systolic (congestive) heart failure: Secondary | ICD-10-CM

## 2016-12-01 MED ORDER — METOPROLOL SUCCINATE ER 50 MG PO TB24: 50.0000 mg | ORAL_TABLET | Freq: Every day | ORAL | 6 refills | Status: DC

## 2016-12-01 NOTE — Patient Instructions (Signed)
Medication Instructions:   Increase Toprol XL to 50mg  daily.  Continue all other medications.    Labwork: none  Testing/Procedures: none  Follow-Up: 6 weeks   Any Other Special Instructions Will Be Listed Below (If Applicable).  If you need a refill on your cardiac medications before your next appointment, please call your pharmacy.

## 2016-12-01 NOTE — Progress Notes (Signed)
Clinical Summary Ms. Lok is a 83 y.o.female seen today for follow up of the following medical problems.    1. Chronic systolic HF - echo [id number] LVEF 35-40% - family has asked to defer ischemic testing.   - no recent SOB or DOE. No recent edema - compliant with meds. Limiting sodium intake. No NSAIDs.   2. COPD - followed by pcp  3. HTN - compliant with meds Past Medical History:  Diagnosis Date  . CHF (congestive heart failure) ([place])   . COPD (chronic obstructive pulmonary disease) ([facility])    no home O2  . Hypertension   . Kidney stones   . Osteoarthritis   . Sepsis ([place])      Allergies  Allergen Reactions  . Erythromycin Swelling  . Levaquin [Levofloxacin] Other (See Comments)    Tongue turns red and sore  . Penicillins Swelling    Has patient had a PCN reaction causing immediate rash, facial/tongue/throat swelling, SOB or lightheadedness with hypotension: Yes Has patient had a PCN reaction causing severe rash involving mucus membranes or skin necrosis: No Has patient had a PCN reaction that required hospitalization No Has patient had a PCN reaction occurring within the last 10 years: No If all of the above answers are "NO", then may proceed with Cephalosporin use.   . Sulfa Antibiotics Other (See Comments)    Tongue turns red and sore     Current Outpatient Prescriptions  Medication Sig Dispense Refill  . aspirin EC 81 MG tablet Take 81 mg by mouth daily.    . clonazePAM (KLONOPIN) 0.5 MG tablet Take 0.125 mg by mouth daily as needed (FOR BP LEVELS).    . feeding supplement, ENSURE ENLIVE, (ENSURE ENLIVE) LIQD Take 237 mLs by mouth 2 (two) times daily between meals. 237 mL 12  . furosemide (LASIX) 40 MG tablet Take 0.5 tablets (20 mg total) by mouth daily. 30 tablet 3  . guaiFENesin (MUCINEX) 600 MG 12 hr tablet Take 2 tablets (1,200 mg total) by mouth 2 (two) times daily. 10 tablet 0  . HYDROcodone-acetaminophen (NORCO/VICODIN) 5-325 MG tablet Take  0.5-1 tablets by mouth daily as needed for moderate pain.    Marland Kitchen ipratropium-albuterol (DUONEB) 0.5-2.5 (3) MG/3ML SOLN Take 3 mLs by nebulization every 4 (four) hours as needed. 360 mL   . lisinopril (PRINIVIL,ZESTRIL) 2.5 MG tablet Take 1 tablet (2.5 mg total) by mouth daily. 30 tablet 3  . metoprolol succinate (TOPROL-XL) 25 MG 24 hr tablet Take 1.5 tablets (37.5 mg total) by mouth daily. 30 tablet 3  . polyethylene glycol (MIRALAX / GLYCOLAX) packet Take 17 g by mouth 2 (two) times daily. (Patient taking differently: Take 17 g by mouth daily as needed for mild constipation. ) 14 each 0  . potassium chloride SA (K-DUR,KLOR-CON) 20 MEQ tablet Take 1 tablet (20 mEq total) by mouth 3 (three) times a week. Takes only when Furosemide is taken 30 tablet 3  . PROAIR HFA 108 (90 Base) MCG/ACT inhaler Inhale 1-2 puffs into the lungs every 6 (six) hours as needed for shortness of breath.      No current facility-administered medications for this visit.      Past Surgical History:  Procedure Laterality Date  . bilateral cataract surg    . BUNIONECTOMY    . CHOLECYSTECTOMY    . COLONOSCOPY  11/16/2012   Procedure: COLONOSCOPY;  Surgeon: Rogene Houston, MD;  [facility];  Service: Endoscopy;  Laterality: N/A;  100  .  LITHOTRIPSY    . ORIF PERIPROSTHETIC FRACTURE Right 10/29/2014   Procedure: OPEN REDUCTION INTERNAL FIXATION (ORIF) PERIPROSTHETIC FEMUR FRACTURE/FEMUR SHAFT;  Surgeon: Mcarthur Rossetti, MD;  Location: [place];  Service: Orthopedics;  Laterality: Right;  . TONSILLECTOMY    . TOTAL HIP ARTHROPLASTY     1989     Allergies  Allergen Reactions  . Erythromycin Swelling  . Levaquin [Levofloxacin] Other (See Comments)    Tongue turns red and sore  . Penicillins Swelling    Has patient had a PCN reaction causing immediate rash, facial/tongue/throat swelling, SOB or lightheadedness with hypotension: Yes Has patient had a PCN reaction causing severe rash involving mucus  membranes or skin necrosis: No Has patient had a PCN reaction that required hospitalization No Has patient had a PCN reaction occurring within the last 10 years: No If all of the above answers are "NO", then may proceed with Cephalosporin use.   . Sulfa Antibiotics Other (See Comments)    Tongue turns red and sore      No family history on file.   Social History Ms. Ribas reports that she has never smoked. She has never used smokeless tobacco. Ms. Kolar reports that she does not drink alcohol.   Review of Systems CONSTITUTIONAL: No weight loss, fever, chills, weakness or fatigue.  HEENT: Eyes: No visual loss, blurred vision, double vision or yellow sclerae.No hearing loss, sneezing, congestion, runny nose or sore throat.  SKIN: No rash or itching.  CARDIOVASCULAR: per HPI RESPIRATORY: No shortness of breath, cough or sputum.  GASTROINTESTINAL: No anorexia, nausea, vomiting or diarrhea. No abdominal pain or blood.  GENITOURINARY: No burning on urination, no polyuria NEUROLOGICAL: No headache, dizziness, syncope, paralysis, ataxia, numbness or tingling in the extremities. No change in bowel or bladder control.  MUSCULOSKELETAL: No muscle, back pain, joint pain or stiffness.  LYMPHATICS: No enlarged nodes. No history of splenectomy.  PSYCHIATRIC: No history of depression or anxiety.  ENDOCRINOLOGIC: No reports of sweating, cold or heat intolerance. No polyuria or polydipsia.  Marland Kitchen   Physical Examination Vitals:   12/01/16 0956  BP: (!) 145/73  Pulse: 98   Vitals:   12/01/16 0956  Weight: 108 lb 12.8 oz (49.4 kg)  Height: 4\' 7"  (1.397 m)    Gen: resting comfortably, no acute distress HEENT: no scleral icterus, pupils equal round and reactive, no palptable cervical adenopathy,  CV: RRR, no m/r/g, no jvd Resp: Clear to auscultation bilaterally GI: abdomen is soft, non-tender, non-distended, normal bowel sounds, no hepatosplenomegaly MSK: extremities are warm, no edema.    Skin: warm, no rash Neuro:  no focal deficits Psych: appropriate affect   Diagnostic Studies 10/2016 echo Study Conclusions  - Left ventricle: The cavity size was normal. Wall thickness was   increased in a pattern of mild LVH. Systolic function was   moderately reduced. The estimated ejection fraction was in the   range of 35% to 40%. Diffuse hypokinesis. There is severe   hypokinesis of the basalinferior myocardium. Features are   consistent with a pseudonormal left ventricular filling pattern,   with concomitant abnormal relaxation and increased filling   pressure (grade 2 diastolic dysfunction). - Aortic valve: Moderately calcified annulus. Trileaflet. - Mitral valve: Calcified annulus. There was mild to moderate   regurgitation. - Left atrium: The atrium was severely dilated. - Right atrium: The atrium was mildly dilated. Central venous   pressure (est): 3 mm Hg. - Atrial septum: No defect or patent foramen ovale was identified. - Tricuspid  valve: There was moderate regurgitation. - Pulmonary arteries: PA peak pressure: 50 mm Hg (S). - Pericardium, extracardiac: There was no pericardial effusion.  Impressions:  - Mild LVH with LVEF approximately 35-40% in the setting of   tachycardia. There is diffuse hypokinesis, most severe in the   basal inferior wall. Probable grade 2 diastolic dysfunction.   Severe left atrial enlargement. Calcified mitral annulus with   mild to moderate mitral regurgitation. Moderately calcified   aortic annulus. Moderate tricuspid regurgitation with PASP 15   mmHg.    Assessment and Plan  1. Chronic systolic HF - no current symptoms - we will increase Toprol XL to 50mg  daily. Careful titraiton in setting of advanced age and fragility - patient and family have asked not to undergo ischemic testing. I think given her advanced age and overall fragility this is a reasonable decision  2. HTN - at goal, follow with Toprol increase as  described above.    F/u 6 weeks   Arnoldo Lenis, M.D.

## 2016-12-10 ENCOUNTER — Encounter (HOSPITAL_COMMUNITY): Payer: Medicare Other

## 2017-01-12 ENCOUNTER — Ambulatory Visit: Payer: Medicare Other | Admitting: Physician Assistant

## 2017-01-13 ENCOUNTER — Encounter: Payer: Self-pay | Admitting: Cardiology

## 2017-01-13 ENCOUNTER — Ambulatory Visit (INDEPENDENT_AMBULATORY_CARE_PROVIDER_SITE_OTHER): Payer: Medicare Other | Admitting: Cardiology

## 2017-01-13 VITALS — BP 146/69 | HR 88 | Ht <= 58 in | Wt 110.4 lb

## 2017-01-13 DIAGNOSIS — I5022 Chronic systolic (congestive) heart failure: Secondary | ICD-10-CM

## 2017-01-13 DIAGNOSIS — I10 Essential (primary) hypertension: Secondary | ICD-10-CM

## 2017-01-13 MED ORDER — METOPROLOL SUCCINATE ER 50 MG PO TB24: ORAL_TABLET | ORAL | 0 refills | Status: DC

## 2017-01-13 NOTE — Patient Instructions (Signed)
Your physician recommends that you schedule a follow-up appointment in: 2 MONTHS WITH DR. BRANCH  Your physician has recommended you make the following change in your medication:   INCREASE TOPROL XL 75 MG DAILY (1 AND 1/2 TABLETS DAILY)   Your physician recommends that you return for lab work in: BMP/MG  Thank you for choosing [facility]!!

## 2017-01-13 NOTE — Progress Notes (Signed)
Clinical Summary Hannah Mckee is a 83 y.o.female seen today for follow up of the following medical problems.    1. Chronic systolic HF - echo 20/2542 LVEF 35-40% - family has asked to defer ischemic testing.    - last visit we increased Toprol to 50mg  daily. Toleratd without side effects - can have some heart palpitations. No SOB/DOE/LE edema  2. COPD - followed by pcp  3. HTN - compliant with meds - does not check bp at home.   Past Medical History:  Diagnosis Date  . CHF (congestive heart failure) ([place])   . COPD (chronic obstructive pulmonary disease) ([facility])    no home O2  . Hypertension   . Kidney stones   . Osteoarthritis   . Sepsis ([place])      Allergies  Allergen Reactions  . Erythromycin Swelling  . Levaquin [Levofloxacin] Other (See Comments)    Tongue turns red and sore  . Penicillins Swelling    Has patient had a PCN reaction causing immediate rash, facial/tongue/throat swelling, SOB or lightheadedness with hypotension: Yes Has patient had a PCN reaction causing severe rash involving mucus membranes or skin necrosis: No Has patient had a PCN reaction that required hospitalization No Has patient had a PCN reaction occurring within the last 10 years: No If all of the above answers are "NO", then may proceed with Cephalosporin use.   . Sulfa Antibiotics Other (See Comments)    Tongue turns red and sore     Current Outpatient Prescriptions  Medication Sig Dispense Refill  . aspirin EC 81 MG tablet Take 81 mg by mouth daily.    . cholecalciferol (VITAMIN D) 1000 units tablet Take 1,000 Units by mouth daily.    . clonazePAM (KLONOPIN) 0.5 MG tablet Take 0.125 mg by mouth daily as needed (FOR BP LEVELS).    . feeding supplement, ENSURE ENLIVE, (ENSURE ENLIVE) LIQD Take 237 mLs by mouth 2 (two) times daily between meals. 237 mL 12  . furosemide (LASIX) 40 MG tablet Take 0.5 tablets (20 mg total) by mouth daily. 30 tablet 3  . HYDROcodone-acetaminophen  (NORCO/VICODIN) 5-325 MG tablet Take 0.5-1 tablets by mouth daily as needed for moderate pain.    Marland Kitchen ipratropium-albuterol (DUONEB) 0.5-2.5 (3) MG/3ML SOLN Take 3 mLs by nebulization every 4 (four) hours as needed. 360 mL   . ketoconazole (NIZORAL) 2 % cream Apply 1 application topically daily as needed.    Marland Kitchen lisinopril (PRINIVIL,ZESTRIL) 2.5 MG tablet Take 1 tablet (2.5 mg total) by mouth daily. 30 tablet 3  . metoprolol succinate (TOPROL-XL) 50 MG 24 hr tablet Take 1 tablet (50 mg total) by mouth daily. 30 tablet 6  . potassium chloride SA (K-DUR,KLOR-CON) 20 MEQ tablet Take 1 tablet (20 mEq total) by mouth 3 (three) times a week. Takes only when Furosemide is taken 30 tablet 3  . SYMBICORT 160-4.5 MCG/ACT inhaler Inhale 1 Inhaler into the lungs daily as needed.     No current facility-administered medications for this visit.      Past Surgical History:  Procedure Laterality Date  . bilateral cataract surg    . BUNIONECTOMY    . CHOLECYSTECTOMY    . COLONOSCOPY  11/16/2012   Procedure: COLONOSCOPY;  Surgeon: Rogene Houston, MD;  [facility];  Service: Endoscopy;  Laterality: N/A;  100  . LITHOTRIPSY    . ORIF PERIPROSTHETIC FRACTURE Right 10/29/2014   Procedure: OPEN REDUCTION INTERNAL FIXATION (ORIF) PERIPROSTHETIC FEMUR FRACTURE/FEMUR SHAFT;  Surgeon:  Mcarthur Rossetti, MD;  Location: [place];  Service: Orthopedics;  Laterality: Right;  . TONSILLECTOMY    . TOTAL HIP ARTHROPLASTY     1989     Allergies  Allergen Reactions  . Erythromycin Swelling  . Levaquin [Levofloxacin] Other (See Comments)    Tongue turns red and sore  . Penicillins Swelling    Has patient had a PCN reaction causing immediate rash, facial/tongue/throat swelling, SOB or lightheadedness with hypotension: Yes Has patient had a PCN reaction causing severe rash involving mucus membranes or skin necrosis: No Has patient had a PCN reaction that required hospitalization No Has patient had a PCN  reaction occurring within the last 10 years: No If all of the above answers are "NO", then may proceed with Cephalosporin use.   . Sulfa Antibiotics Other (See Comments)    Tongue turns red and sore      No family history on file.   Social History Hannah Mckee reports that she quit smoking about 19 years ago. Her smoking use included Cigarettes. She has never used smokeless tobacco. Hannah Mckee reports that she does not drink alcohol.   Review of Systems CONSTITUTIONAL: No weight loss, fever, chills, weakness or fatigue.  HEENT: Eyes: No visual loss, blurred vision, double vision or yellow sclerae.No hearing loss, sneezing, congestion, runny nose or sore throat.  SKIN: No rash or itching.  CARDIOVASCULAR: per hpi RESPIRATORY: No shortness of breath, cough or sputum.  GASTROINTESTINAL: No anorexia, nausea, vomiting or diarrhea. No abdominal pain or blood.  GENITOURINARY: No burning on urination, no polyuria NEUROLOGICAL: No headache, dizziness, syncope, paralysis, ataxia, numbness or tingling in the extremities. No change in bowel or bladder control.  MUSCULOSKELETAL: No muscle, back pain, joint pain or stiffness.  LYMPHATICS: No enlarged nodes. No history of splenectomy.  PSYCHIATRIC: No history of depression or anxiety.  ENDOCRINOLOGIC: No reports of sweating, cold or heat intolerance. No polyuria or polydipsia.  Marland Kitchen   Physical Examination Vitals:   01/13/17 1113  BP: (!) 146/69  Pulse: 88   Vitals:   01/13/17 1113  Weight: 110 lb 6.4 oz (50.1 kg)  Height: 4\' 7"  (1.397 m)    Gen: resting comfortably, no acute distress HEENT: no scleral icterus, pupils equal round and reactive, no palptable cervical adenopathy,  CV: RRR, no m/r/g, no jvd Resp: Clear to auscultation bilaterally GI: abdomen is soft, non-tender, non-distended, normal bowel sounds, no hepatosplenomegaly MSK: extremities are warm, no edema.  Skin: warm, no rash Neuro:  no focal deficits Psych: appropriate  affect   Diagnostic Studies 10/2016 echo Study Conclusions  - Left ventricle: The cavity size was normal. Wall thickness was increased in a pattern of mild LVH. Systolic function was moderately reduced. The estimated ejection fraction was in the range of 35% to 40%. Diffuse hypokinesis. There is severe hypokinesis of the basalinferior myocardium. Features are consistent with a pseudonormal left ventricular filling pattern, with concomitant abnormal relaxation and increased filling pressure (grade 2 diastolic dysfunction). - Aortic valve: Moderately calcified annulus. Trileaflet. - Mitral valve: Calcified annulus. There was mild to moderate regurgitation. - Left atrium: The atrium was severely dilated. - Right atrium: The atrium was mildly dilated. Central venous pressure (est): 3 mm Hg. - Atrial septum: No defect or patent foramen ovale was identified. - Tricuspid valve: There was moderate regurgitation. - Pulmonary arteries: PA peak pressure: 50 mm Hg (S). - Pericardium, extracardiac: There was no pericardial effusion.  Impressions:  - Mild LVH with LVEF approximately 35-40% in the  setting of tachycardia. There is diffuse hypokinesis, most severe in the basal inferior wall. Probable grade 2 diastolic dysfunction. Severe left atrial enlargement. Calcified mitral annulus with mild to moderate mitral regurgitation. Moderately calcified aortic annulus. Moderate tricuspid regurgitation with PASP 15 mmHg.      Assessment and Plan  1. Chronic systolic HF - we will increase Toprol XL to 75mg  daily. Careful titraiton in setting of advanced age and fragility - patient and family have asked not to undergo ischemic testing.  - no current symptoms - repeat echo next visit  2. HTN - at goal, follow as we titrate her CHF medications.    F/u 2 months. Check BMET/Mg on diuretic.       Arnoldo Lenis, M.D.

## 2017-01-24 ENCOUNTER — Other Ambulatory Visit: Payer: Self-pay | Admitting: Adult Health

## 2017-02-09 ENCOUNTER — Telehealth: Payer: Self-pay | Admitting: *Deleted

## 2017-02-09 NOTE — Telephone Encounter (Signed)
Pt aware - routed to pcp  

## 2017-02-09 NOTE — Telephone Encounter (Signed)
-----   Message from Arnoldo Lenis, MD sent at 02/08/2017 11:22 AM EDT ----- Labs look good  Zandra Abts MD

## 2017-03-05 ENCOUNTER — Encounter (HOSPITAL_COMMUNITY): Payer: Self-pay | Admitting: Emergency Medicine

## 2017-03-05 ENCOUNTER — Emergency Department (HOSPITAL_COMMUNITY)
Admission: EM | Admit: 2017-03-05 | Discharge: 2017-03-05 | Disposition: A | Discharge status: Home / Self Care | Payer: Medicare Other | Attending: Emergency Medicine | Admitting: Emergency Medicine

## 2017-03-05 ENCOUNTER — Emergency Department (HOSPITAL_COMMUNITY): Payer: Medicare Other

## 2017-03-05 DIAGNOSIS — Z87891 Personal history of nicotine dependence: Secondary | ICD-10-CM | POA: Insufficient documentation

## 2017-03-05 DIAGNOSIS — Z79899 Other long term (current) drug therapy: Secondary | ICD-10-CM | POA: Diagnosis not present

## 2017-03-05 DIAGNOSIS — I5021 Acute systolic (congestive) heart failure: Secondary | ICD-10-CM | POA: Diagnosis not present

## 2017-03-05 DIAGNOSIS — Z96649 Presence of unspecified artificial hip joint: Secondary | ICD-10-CM | POA: Insufficient documentation

## 2017-03-05 DIAGNOSIS — I110 Hypertensive heart disease with heart failure: Secondary | ICD-10-CM | POA: Insufficient documentation

## 2017-03-05 DIAGNOSIS — Z7982 Long term (current) use of aspirin: Secondary | ICD-10-CM | POA: Insufficient documentation

## 2017-03-05 DIAGNOSIS — J449 Chronic obstructive pulmonary disease, unspecified: Secondary | ICD-10-CM | POA: Insufficient documentation

## 2017-03-05 DIAGNOSIS — K1120 Sialoadenitis, unspecified: Secondary | ICD-10-CM

## 2017-03-05 DIAGNOSIS — K1121 Acute sialoadenitis: Secondary | ICD-10-CM | POA: Insufficient documentation

## 2017-03-05 DIAGNOSIS — R221 Localized swelling, mass and lump, neck: Secondary | ICD-10-CM | POA: Diagnosis present

## 2017-03-05 LAB — COMPREHENSIVE METABOLIC PANEL
ALBUMIN: 3.6 g/dL (ref 3.5–5.0)
ALT: 13 U/L — ABNORMAL LOW (ref 14–54)
ANION GAP: 8 (ref 5–15)
AST: 18 U/L (ref 15–41)
Alkaline Phosphatase: 75 U/L (ref 38–126)
BILIRUBIN TOTAL: 0.4 mg/dL (ref 0.3–1.2)
BUN: 18 mg/dL (ref 6–20)
CO2: 31 mmol/L (ref 22–32)
Calcium: 9.9 mg/dL (ref 8.9–10.3)
Chloride: 101 mmol/L (ref 101–111)
Creatinine, Ser: 0.84 mg/dL (ref 0.44–1.00)
GLUCOSE: 101 mg/dL — AB (ref 65–99)
POTASSIUM: 4 mmol/L (ref 3.5–5.1)
Sodium: 140 mmol/L (ref 135–145)
TOTAL PROTEIN: 7 g/dL (ref 6.5–8.1)

## 2017-03-05 LAB — CBC WITH DIFFERENTIAL/PLATELET
BASOS ABS: 0.1 10*3/uL (ref 0.0–0.1)
BASOS PCT: 0 %
EOS ABS: 0.4 10*3/uL (ref 0.0–0.7)
Eosinophils Relative: 3 %
HEMATOCRIT: 39.3 % (ref 36.0–46.0)
HEMOGLOBIN: 12.7 g/dL (ref 12.0–15.0)
Lymphocytes Relative: 14 %
Lymphs Abs: 1.9 10*3/uL (ref 0.7–4.0)
MCH: 28.9 pg (ref 26.0–34.0)
MCHC: 32.3 g/dL (ref 30.0–36.0)
MCV: 89.5 fL (ref 78.0–100.0)
MONOS PCT: 7 %
Monocytes Absolute: 1 10*3/uL (ref 0.1–1.0)
NEUTROS ABS: 10.5 10*3/uL — AB (ref 1.7–7.7)
NEUTROS PCT: 76 %
Platelets: 260 10*3/uL (ref 150–400)
RBC: 4.39 MIL/uL (ref 3.87–5.11)
RDW: 13.9 % (ref 11.5–15.5)
WBC: 13.8 10*3/uL — AB (ref 4.0–10.5)

## 2017-03-05 MED ORDER — IOPAMIDOL (ISOVUE-300) INJECTION 61%
75.0000 mL | Freq: Once | INTRAVENOUS | Status: AC | PRN
  Administered 2017-03-05: 75 mL via INTRAVENOUS

## 2017-03-05 MED ORDER — CLINDAMYCIN HCL 150 MG PO CAPS
300.0000 mg | ORAL_CAPSULE | Freq: Once | ORAL | Status: AC
  Administered 2017-03-05: 300 mg via ORAL
  Filled 2017-03-05: qty 2

## 2017-03-05 MED ORDER — TRAMADOL HCL 50 MG PO TABS: 50.0000 mg | ORAL_TABLET | Freq: Four times a day (QID) | ORAL | 0 refills | Status: DC | PRN

## 2017-03-05 MED ORDER — SODIUM CHLORIDE 0.9 % IV SOLN
INTRAVENOUS | Status: DC
  Administered 2017-03-05: 16:00:00 via INTRAVENOUS

## 2017-03-05 MED ORDER — CLINDAMYCIN HCL 300 MG PO CAPS: 300.0000 mg | ORAL_CAPSULE | Freq: Three times a day (TID) | ORAL | 0 refills | Status: DC

## 2017-03-05 NOTE — ED Notes (Signed)
Pt states understanding of d/c instructions, follow up care, and prescription use. Denies rash, itching, SOB, or throat swelling. No further questions or concerns at this time.  Ambulatory to d/c.

## 2017-03-05 NOTE — ED Provider Notes (Signed)
[facility] DEPT Provider Note   CSN: [id number] Arrival date & time: 03/05/17  1423     History   Chief Complaint Chief Complaint  Patient presents with  . Oral Swelling    HPI Hannah Mckee is a 83 y.o. female.  Patient presents with acute onset of swelling to the neck that started yesterday. More swollen today. And a painful area on the left side of the jaw. No fevers. No trouble swallowing. Mild sore throat. Patient states she doesn't have any teeth on the lower part of her jaw all other than in the front. No history of similar problem.      Past Medical History:  Diagnosis Date  . CHF (congestive heart failure) ([place])   . COPD (chronic obstructive pulmonary disease) ([facility])    no home O2  . Hypertension   . Kidney stones   . Osteoarthritis   . Sepsis [name])     Patient Active Problem List   Diagnosis Date Noted  . COPD exacerbation ([place]) 10/11/2016  . Muscle weakness (generalized)   . Sinus tachycardia   . Acute systolic CHF (congestive heart failure) ([place])   . Lobar pneumonia, unspecified organism ([place])   . CAP (community acquired pneumonia) 10/04/2016  . Sepsis ([place]) 10/04/2016  . Acute respiratory failure with hypoxia ([place]) 10/04/2016  . Elevated troponin 10/04/2016  . Hypokalemia 10/04/2016  . Moderate malnutrition ([place]) 10/04/2016  . Right femoral fracture ([place]) 10/28/2014  . COPD (chronic obstructive pulmonary disease) ([place])   . Fall   . Unspecified constipation 04/16/2013  . Diverticulitis of colon without hemorrhage 04/16/2013  . Rectal bleed 11/06/2012  . Hypertension 11/06/2012    Past Surgical History:  Procedure Laterality Date  . bilateral cataract surg    . BUNIONECTOMY    . CHOLECYSTECTOMY    . COLONOSCOPY  11/16/2012   Procedure: COLONOSCOPY;  Surgeon: Rogene Houston, MD;  [facility];  Service: Endoscopy;  Laterality: N/A;  100  . LITHOTRIPSY    . ORIF PERIPROSTHETIC FRACTURE Right 10/29/2014   Procedure: OPEN  REDUCTION INTERNAL FIXATION (ORIF) PERIPROSTHETIC FEMUR FRACTURE/FEMUR SHAFT;  Surgeon: Mcarthur Rossetti, MD;  Location: [facility];  Service: Orthopedics;  Laterality: Right;  . TONSILLECTOMY    . TOTAL HIP ARTHROPLASTY     1989    OB History    Gravida Para Term Preterm AB Living             4   SAB TAB Ectopic Multiple Live Births                   Home Medications    Prior to Admission medications   Medication Sig Start Date End Date Taking? Authorizing Provider  ANORO ELLIPTA 62.5-25 MCG/INH AEPB Inhale 1 puff into the lungs daily. 02/17/17  Yes [provider]  aspirin EC 81 MG tablet Take 81 mg by mouth daily.   Yes [provider]  cholecalciferol (VITAMIN D) 1000 units tablet Take 1,000 Units by mouth daily.   Yes [provider]  clonazePAM (KLONOPIN) 0.5 MG tablet Take 0.125 mg by mouth daily as needed for anxiety.    Yes [provider]  feeding supplement, ENSURE ENLIVE, (ENSURE ENLIVE) LIQD Take 237 mLs by mouth 2 (two) times daily between meals. Patient taking differently: Take 237 mLs by mouth daily.  10/11/16  Yes Dhungel, Nishant, MD  furosemide (LASIX) 40 MG tablet Take 0.5 tablets (20 mg total) by mouth daily. 10/21/16  Yes  Lendon Colonel, NP  ketoconazole (NIZORAL) 2 % cream Apply 1 application topically daily as needed for irritation.  11/22/16  Yes [provider]  lisinopril (PRINIVIL,ZESTRIL) 2.5 MG tablet TAKE 1 TABLET BY MOUTH EVERY DAY 01/24/17  Yes Branch, Alphonse Guild, MD  metoprolol succinate (TOPROL-XL) 50 MG 24 hr tablet TAKE 1 AND 1/2 TABLETS DAILY 01/13/17  Yes Branch, Alphonse Guild, MD  potassium chloride SA (K-DUR,KLOR-CON) 20 MEQ tablet Take 1 tablet (20 mEq total) by mouth 3 (three) times a week. Takes only when Furosemide is taken Patient taking differently: Take 10 mEq by mouth daily. Takes only when Furosemide is taken 10/22/16  Yes Lendon Colonel, NP  clindamycin (CLEOCIN) 300 MG capsule Take 1  capsule (300 mg total) by mouth 3 (three) times daily. 03/05/17   Fredia Sorrow, MD  traMADol (ULTRAM) 50 MG tablet Take 1 tablet (50 mg total) by mouth every 6 (six) hours as needed. 03/05/17   Fredia Sorrow, MD    Family History History reviewed. No pertinent family history.  Social History Social History  Substance Use Topics  . Smoking status: Former Smoker    Types: Cigarettes    Quit date: 01/1997  . Smokeless tobacco: Never Used  . Alcohol use No     Allergies   Erythromycin; Levaquin [levofloxacin]; Penicillins; and Sulfa antibiotics   Review of Systems Review of Systems  Constitutional: Negative for fever.  HENT: Positive for dental problem, facial swelling and sore throat. Negative for trouble swallowing.   Eyes: Negative for visual disturbance.  Respiratory: Negative for shortness of breath.   Cardiovascular: Negative for chest pain.  Gastrointestinal: Negative for abdominal pain, nausea and vomiting.  Genitourinary: Negative for dysuria.  Musculoskeletal: Positive for neck pain. Negative for back pain.  Skin: Positive for rash.  Neurological: Negative for speech difficulty and headaches.  Hematological: Does not bruise/bleed easily.  Psychiatric/Behavioral: Negative for confusion.     Physical Exam Updated Vital Signs BP (!) 153/57   Pulse 71   Temp 97.6 F (36.4 C) (Oral)   Resp 16   Ht 4\' 7"  (1.397 m)   Wt 50.8 kg   SpO2 97%   BMI 26.03 kg/m   Physical Exam  Constitutional: She is oriented to person, place, and time. She appears well-developed and well-nourished. No distress.  HENT:  Head: Normocephalic and atraumatic.  Only lower teeth are in the incisor area. No significant gum swelling. No palpable mass on the left oral side of the cheek. Externally the angle of the jaw and the left side there is a 3 x 3 hard mass. In the parotid area. Soft tissue swelling midline part of the neck with a little bit of erythema but no tenderness. No induration  no firmness. Some mild erythema to the base of the neck bilaterally.  Eyes: EOM are normal. Pupils are equal, round, and reactive to light.  Neck: Normal range of motion. Neck supple.  Cardiovascular: Normal rate and regular rhythm.   Pulmonary/Chest: Effort normal and breath sounds normal. No respiratory distress.  Abdominal: Soft. Bowel sounds are normal. There is no tenderness.  Musculoskeletal: Normal range of motion. She exhibits no edema.  Neurological: She is alert and oriented to person, place, and time. No cranial nerve deficit or sensory deficit. She exhibits normal muscle tone. Coordination normal.  Skin: Skin is warm. There is erythema.  Nursing note and vitals reviewed.    ED Treatments / Results  Labs (all labs ordered are listed, but only abnormal  results are displayed) Labs Reviewed  CBC WITH DIFFERENTIAL/PLATELET - Abnormal; Notable for the following:       Result Value   WBC 13.8 (*)    Neutro Abs 10.5 (*)    All other components within normal limits  COMPREHENSIVE METABOLIC PANEL - Abnormal; Notable for the following:    Glucose, Bld 101 (*)    ALT 13 (*)    All other components within normal limits    EKG  EKG Interpretation None       Radiology Ct Soft Tissue Neck W Contrast  Result Date: 03/05/2017 CLINICAL DATA:  Left jaw mass. Swelling and redness since yesterday. Leukocytosis. EXAM: CT NECK WITH CONTRAST TECHNIQUE: Multidetector CT imaging of the neck was performed using the standard protocol following the bolus administration of intravenous contrast. CONTRAST:  51mL ISOVUE-300 IOPAMIDOL (ISOVUE-300) INJECTION 61% COMPARISON:  None. FINDINGS: Pharynx and larynx: Negative for mass or swelling Salivary glands: Asymmetric enlargement and increased density of the left parotid with mild fat stranding and upper left platysma thickening. No discrete mass is seen. Punctate stones seen at the left parotid duct as it crosses the masseter. Thyroid: Multinodular,  with dominant nodule on the right inferiorly measuring at least 31 mm. No local invasive features. Lymph nodes: No concerning enlargement or abnormal density. Vascular: Diffuse atherosclerotic calcification. Arterial tortuosity. Limited intracranial: Calcified dural-based mass in the the low right CP angle cistern measuring 13 mm. This over rides the hypoglossal canal. Visualized orbits: Bilateral cataract resection. No pathologic finding. Mastoids and visualized paranasal sinuses: Fluid levels in the sphenoid sinuses but no mucosal thickening. Mild mucosal thickening in the ethmoids. Sequela of chronic right maxillary sinusitis. Skeleton: Exaggerated kyphosis in the thoracic spine with syndesmophytes causing upper thoracic ankylosis. No acute or aggressive finding noted Upper chest: No acute finding IMPRESSION: 1. 1 mm stone in the left parotid duct with sialoadenitis. 2. 13 mm calcified meningioma in the low right posterior fossa, over riding the hypoglossal canal. 3. Multinodular thyroid. 4. 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 Electronically Signed   By: Monte Fantasia M.D.   On: 03/05/2017 17:30    Procedures Procedures (including critical care time)  Medications Ordered in ED Medications  0.9 %  sodium chloride infusion ( Intravenous New Bag/Given 03/05/17 1530)  iopamidol (ISOVUE-300) 61 % injection 75 mL (75 mLs Intravenous Contrast Given 03/05/17 1700)  clindamycin (CLEOCIN) capsule 300 mg (300 mg Oral Given 03/05/17 1919)     Initial Impression / Assessment and Plan / ED Course  I have reviewed the triage vital signs and the nursing notes.  Pertinent labs & imaging results that were available during my care of the patient were reviewed by me and considered in my medical decision making (see chart for details).     CT scan confirms a parotid stone. Patient with a broad list of allergies. However she did have an admission to [facility] for  community-acquired pneumonia and sepsis and did receive ceftriaxone and azithromycin for several days IV without any allergic reaction. Patient's list claims that she is allergic to erythromycin.  Clindamycin would be an ideal choice for the parotid gland infection and stone as well as follow-up with ENT of course. Discussed with the pharmacist on call and they recommended the oral trial clindamycin she did fine then discharge her home on oral clindamycin. Fact that she tolerated the azithromycin IV in the hospital she'll probably do fine" and it.  This advice was followed and patient did fine on oral  clindamycin discharged home on clindamycin with follow-up with ear nose and throat.   Patient sent also home with tramadol. Patient nontoxic no acute distress. Patient with significant improvement by time of discharge. Final Clinical Impressions(s) / ED Diagnoses   Final diagnoses:  Parotitis  Acute sialoadenitis    New Prescriptions New Prescriptions   CLINDAMYCIN (CLEOCIN) 300 MG CAPSULE    Take 1 capsule (300 mg total) by mouth 3 (three) times daily.   TRAMADOL (ULTRAM) 50 MG TABLET    Take 1 tablet (50 mg total) by mouth every 6 (six) hours as needed.     Fredia Sorrow, MD 03/05/17 2032

## 2017-03-05 NOTE — Discharge Instructions (Signed)
Make appointment to  follow up with ear nose and throat.  Take the antibiotic as directed. Take  the tramadol as needed for pain.

## 2017-03-05 NOTE — ED Triage Notes (Signed)
Pt reports neck swelling starting yesterday.  States it began under her neck and now is on the left side.  Hard swollen area under jaw noted.

## 2017-03-15 ENCOUNTER — Encounter: Payer: Self-pay | Admitting: Cardiology

## 2017-03-15 ENCOUNTER — Ambulatory Visit (INDEPENDENT_AMBULATORY_CARE_PROVIDER_SITE_OTHER): Payer: Medicare Other | Admitting: Cardiology

## 2017-03-15 ENCOUNTER — Telehealth: Payer: Self-pay | Admitting: Cardiology

## 2017-03-15 VITALS — BP 163/71 | HR 66 | Ht <= 58 in | Wt 113.4 lb

## 2017-03-15 DIAGNOSIS — I5022 Chronic systolic (congestive) heart failure: Secondary | ICD-10-CM

## 2017-03-15 DIAGNOSIS — I10 Essential (primary) hypertension: Secondary | ICD-10-CM | POA: Diagnosis not present

## 2017-03-15 NOTE — Telephone Encounter (Signed)
Pre-cert Verification for the following procedure   Echo scheduled for 04/07/17 at [facility]

## 2017-03-15 NOTE — Patient Instructions (Signed)
Your physician recommends that you schedule a follow-up appointment TO BE DETERMINED AFTER TESTING WITH [facility]  Your physician recommends that you continue on your current medications as directed. Please refer to the Current Medication list given to you today.  Your physician has requested that you have an echocardiogram. Echocardiography is a painless test that uses sound waves to create images of your heart. It provides your doctor with information about the size and shape of your heart and how well your heart's chambers and valves are working. This procedure takes approximately one hour. There are no restrictions for this procedure.  Thank you for choosing [facility]!!

## 2017-03-15 NOTE — Progress Notes (Signed)
Clinical Summary Hannah Mckee is a 83 y.o.female seen today for follow up of the following medical problems.    1. Chronic systolic HF - echo 17/7939 LVEF 35-40% - family has asked to defer ischemic testing.     - last visit we increased Toprol XL to 75mg  daily. - no SOB or DOE. Occasional LE edema. Home weights stable around 112-114 - isolated episode of fatigue and dizziness today. Has not had any thing to eat or drink yet this AM    Past Medical History:  Diagnosis Date  . CHF (congestive heart failure) ([place])   . COPD (chronic obstructive pulmonary disease) ([facility])    no home O2  . Hypertension   . Kidney stones   . Osteoarthritis   . Sepsis ([place])      Allergies  Allergen Reactions  . Erythromycin Swelling  . Levaquin [Levofloxacin] Other (See Comments)    Tongue turns red and sore  . Penicillins Swelling    Has patient had a PCN reaction causing immediate rash, facial/tongue/throat swelling, SOB or lightheadedness with hypotension: Yes Has patient had a PCN reaction causing severe rash involving mucus membranes or skin necrosis: No Has patient had a PCN reaction that required hospitalization No Has patient had a PCN reaction occurring within the last 10 years: No If all of the above answers are "NO", then may proceed with Cephalosporin use.   . Sulfa Antibiotics Other (See Comments)    Tongue turns red and sore     Current Outpatient Prescriptions  Medication Sig Dispense Refill  . ANORO ELLIPTA 62.5-25 MCG/INH AEPB Inhale 1 puff into the lungs daily.    Marland Kitchen aspirin EC 81 MG tablet Take 81 mg by mouth daily.    . cholecalciferol (VITAMIN D) 1000 units tablet Take 1,000 Units by mouth daily.    . clindamycin (CLEOCIN) 300 MG capsule Take 1 capsule (300 mg total) by mouth 3 (three) times daily. 21 capsule 0  . clonazePAM (KLONOPIN) 0.5 MG tablet Take 0.125 mg by mouth daily as needed for anxiety.     . feeding supplement, ENSURE ENLIVE, (ENSURE ENLIVE)  LIQD Take 237 mLs by mouth 2 (two) times daily between meals. (Patient taking differently: Take 237 mLs by mouth daily. ) 237 mL 12  . furosemide (LASIX) 40 MG tablet Take 0.5 tablets (20 mg total) by mouth daily. 30 tablet 3  . ketoconazole (NIZORAL) 2 % cream Apply 1 application topically daily as needed for irritation.     Marland Kitchen lisinopril (PRINIVIL,ZESTRIL) 2.5 MG tablet TAKE 1 TABLET BY MOUTH EVERY DAY 90 tablet 3  . metoprolol succinate (TOPROL-XL) 50 MG 24 hr tablet TAKE 1 AND 1/2 TABLETS DAILY 135 tablet 0  . potassium chloride SA (K-DUR,KLOR-CON) 20 MEQ tablet Take 1 tablet (20 mEq total) by mouth 3 (three) times a week. Takes only when Furosemide is taken (Patient taking differently: Take 10 mEq by mouth daily. Takes only when Furosemide is taken) 30 tablet 3  . traMADol (ULTRAM) 50 MG tablet Take 1 tablet (50 mg total) by mouth every 6 (six) hours as needed. 15 tablet 0   No current facility-administered medications for this visit.      Past Surgical History:  Procedure Laterality Date  . bilateral cataract surg    . BUNIONECTOMY    . CHOLECYSTECTOMY    . COLONOSCOPY  11/16/2012   Procedure: COLONOSCOPY;  Surgeon: Rogene Houston, MD;  [facility];  Service: Endoscopy;  Laterality: N/A;  100  . LITHOTRIPSY    . ORIF PERIPROSTHETIC FRACTURE Right 10/29/2014   Procedure: OPEN REDUCTION INTERNAL FIXATION (ORIF) PERIPROSTHETIC FEMUR FRACTURE/FEMUR SHAFT;  Surgeon: Mcarthur Rossetti, MD;  Location: [facility];  Service: Orthopedics;  Laterality: Right;  . TONSILLECTOMY    . TOTAL HIP ARTHROPLASTY     1989     Allergies  Allergen Reactions  . Erythromycin Swelling  . Levaquin [Levofloxacin] Other (See Comments)    Tongue turns red and sore  . Penicillins Swelling    Has patient had a PCN reaction causing immediate rash, facial/tongue/throat swelling, SOB or lightheadedness with hypotension: Yes Has patient had a PCN reaction causing severe rash involving mucus membranes  or skin necrosis: No Has patient had a PCN reaction that required hospitalization No Has patient had a PCN reaction occurring within the last 10 years: No If all of the above answers are "NO", then may proceed with Cephalosporin use.   . Sulfa Antibiotics Other (See Comments)    Tongue turns red and sore      No family history on file.   Social History Hannah Mckee reports that she quit smoking about 20 years ago. Her smoking use included Cigarettes. She has never used smokeless tobacco. Hannah Mckee reports that she does not drink alcohol.   Review of Systems CONSTITUTIONAL: No weight loss, fever, chills, weakness or fatigue.  HEENT: Eyes: No visual loss, blurred vision, double vision or yellow sclerae.No hearing loss, sneezing, congestion, runny nose or sore throat.  SKIN: No rash or itching.  CARDIOVASCULAR: per HPI RESPIRATORY: No shortness of breath, cough or sputum.  GASTROINTESTINAL: No anorexia, nausea, vomiting or diarrhea. No abdominal pain or blood.  GENITOURINARY: No burning on urination, no polyuria NEUROLOGICAL: per HPI MUSCULOSKELETAL: No muscle, back pain, joint pain or stiffness.  LYMPHATICS: No enlarged nodes. No history of splenectomy.  PSYCHIATRIC: No history of depression or anxiety.  ENDOCRINOLOGIC: No reports of sweating, cold or heat intolerance. No polyuria or polydipsia.  Marland Kitchen   Physical Examination Vitals:   03/15/17 1037  BP: (!) 163/71  Pulse: 66   Vitals:   03/15/17 1037  Weight: 113 lb 6.4 oz (51.4 kg)  Height: 4\' 7"  (1.397 m)    Gen: resting comfortably, no acute distress HEENT: no scleral icterus, pupils equal round and reactive, no palptable cervical adenopathy,  CV: RRR, no m/r/g, no jvd Resp: Clear to auscultation bilaterally GI: abdomen is soft, non-tender, non-distended, normal bowel sounds, no hepatosplenomegaly MSK: extremities are warm, no edema.  Skin: warm, no rash Neuro:  no focal deficits Psych: appropriate  affect   Diagnostic Studies  10/2016 echo Study Conclusions  - Left ventricle: The cavity size was normal. Wall thickness was increased in a pattern of mild LVH. Systolic function was moderately reduced. The estimated ejection fraction was in the range of 35% to 40%. Diffuse hypokinesis. There is severe hypokinesis of the basalinferior myocardium. Features are consistent with a pseudonormal left ventricular filling pattern, with concomitant abnormal relaxation and increased filling pressure (grade 2 diastolic dysfunction). - Aortic valve: Moderately calcified annulus. Trileaflet. - Mitral valve: Calcified annulus. There was mild to moderate regurgitation. - Left atrium: The atrium was severely dilated. - Right atrium: The atrium was mildly dilated. Central venous pressure (est): 3 mm Hg. - Atrial septum: No defect or patent foramen ovale was identified. - Tricuspid valve: There was moderate regurgitation. - Pulmonary arteries: PA peak pressure: 50 mm Hg (S). - Pericardium, extracardiac: There was no pericardial effusion.  Impressions:  -  Mild LVH with LVEF approximately 35-40% in the setting of tachycardia. There is diffuse hypokinesis, most severe in the basal inferior wall. Probable grade 2 diastolic dysfunction. Severe left atrial enlargement. Calcified mitral annulus with mild to moderate mitral regurgitation. Moderately calcified aortic annulus. Moderate tricuspid regurgitation with PASP 15 mmHg.      Assessment and Plan  1. Chronic systolic HF - we will repeat echo, pending results consider further medication titration. She may reconsider ischemic testing if her LVEF remains decreased, we will need to discuss further at f/u.  - continue curernt meds  2. HTN - elevated in clinic. Some orthostatic symptoms at times, at this time will not titrate meds and follow bp's.         Arnoldo Lenis, M.D.

## 2017-04-07 ENCOUNTER — Other Ambulatory Visit: Payer: Self-pay

## 2017-04-07 ENCOUNTER — Ambulatory Visit (INDEPENDENT_AMBULATORY_CARE_PROVIDER_SITE_OTHER): Payer: Medicare Other

## 2017-04-07 DIAGNOSIS — I5022 Chronic systolic (congestive) heart failure: Secondary | ICD-10-CM | POA: Diagnosis not present

## 2017-04-07 LAB — ECHOCARDIOGRAM COMPLETE
AVPHT: 452 ms
CHL CUP DOP CALC LVOT VTI: 20.7 cm
CHL CUP MV DEC (S): 180
CHL CUP TV REG PEAK VELOCITY: 294 cm/s
E decel time: 180 msec
E/e' ratio: 14.56
FS: 38 % (ref 28–44)
IVS/LV PW RATIO, ED: 0.91
LA diam end sys: 36 mm
LA vol: 55.6 mL
LADIAMINDEX: 2.52 cm/m2
LASIZE: 36 mm
LAVOLA4C: 55 mL
LAVOLIN: 38.9 mL/m2
LDCA: 3.14 cm2
LV E/e' medial: 14.56
LV E/e'average: 14.56
LV PW d: 10.5 mm — AB (ref 0.6–1.1)
LV TDI E'MEDIAL: 4.88
LV dias vol index: 38 mL/m2
LV e' LATERAL: 5.9 cm/s
LV sys vol index: 16 mL/m2
LV sys vol: 23 mL (ref 14–42)
LVDIAVOL: 54 mL (ref 46–106)
LVOT diameter: 20 mm
LVOT peak vel: 97.7 cm/s
LVOTSV: 65 mL
Lateral S' vel: 15.9 cm/s
MV Peak grad: 3 mmHg
MVPKAVEL: 100 m/s
MVPKEVEL: 85.9 m/s
RV sys press: 38 mmHg
Simpson's disk: 58
Stroke v: 31 ml
TAPSE: 22.9 mm
TDI e' lateral: 5.9
TRMAXVEL: 294 cm/s

## 2017-04-12 ENCOUNTER — Telehealth: Payer: Self-pay | Admitting: Cardiology

## 2017-04-12 NOTE — Telephone Encounter (Signed)
Normal echo results given to both patient and daughter, copied pcp, placed in recall list for 4 months in [place] office with Dr Harl Bowie

## 2017-04-12 NOTE — Telephone Encounter (Signed)
Results of ultrasound / tg  °

## 2017-04-12 NOTE — Telephone Encounter (Signed)
Returned pt's call,got vm,[id number]

## 2017-05-03 ENCOUNTER — Other Ambulatory Visit: Payer: Self-pay | Admitting: Cardiology

## 2017-05-03 MED ORDER — METOPROLOL SUCCINATE ER 50 MG PO TB24: ORAL_TABLET | ORAL | 1 refills | Status: DC

## 2017-05-03 NOTE — Telephone Encounter (Signed)
Medication sent to pharmacy  

## 2017-05-03 NOTE — Telephone Encounter (Signed)
°*  STAT* If patient is at the pharmacy, call can be transferred to refill team.   1. Which medications need to be refilled? (please list name of each medication and dose if known)   metoprolol succinate (TOPROL-XL) 50 MG 24 hr tablet     2. Which pharmacy/location (including street and city if local pharmacy) is medication to be sent to?  [facility],  [place], [place]    3. Do they need a 30 day or 90 day supply?

## 2017-05-23 ENCOUNTER — Emergency Department (HOSPITAL_COMMUNITY): Payer: Medicare Other

## 2017-05-23 ENCOUNTER — Telehealth: Payer: Self-pay | Admitting: Cardiology

## 2017-05-23 ENCOUNTER — Telehealth: Payer: Self-pay

## 2017-05-23 ENCOUNTER — Inpatient Hospital Stay (HOSPITAL_COMMUNITY)
Admission: EM | Admit: 2017-05-23 | Discharge: 2017-05-25 | DRG: 190 | Disposition: A | Discharge status: Home / Self Care | Payer: Medicare Other | Attending: Pulmonary Disease | Admitting: Pulmonary Disease

## 2017-05-23 ENCOUNTER — Encounter (HOSPITAL_COMMUNITY): Payer: Self-pay

## 2017-05-23 ENCOUNTER — Ambulatory Visit: Payer: Medicare Other | Admitting: Adult Health

## 2017-05-23 DIAGNOSIS — Z9111 Patient's noncompliance with dietary regimen: Secondary | ICD-10-CM | POA: Diagnosis not present

## 2017-05-23 DIAGNOSIS — Z96649 Presence of unspecified artificial hip joint: Secondary | ICD-10-CM | POA: Diagnosis present

## 2017-05-23 DIAGNOSIS — Z7982 Long term (current) use of aspirin: Secondary | ICD-10-CM | POA: Diagnosis not present

## 2017-05-23 DIAGNOSIS — I509 Heart failure, unspecified: Secondary | ICD-10-CM

## 2017-05-23 DIAGNOSIS — N390 Urinary tract infection, site not specified: Secondary | ICD-10-CM | POA: Diagnosis present

## 2017-05-23 DIAGNOSIS — F419 Anxiety disorder, unspecified: Secondary | ICD-10-CM | POA: Diagnosis present

## 2017-05-23 DIAGNOSIS — Z79899 Other long term (current) drug therapy: Secondary | ICD-10-CM | POA: Diagnosis not present

## 2017-05-23 DIAGNOSIS — R0902 Hypoxemia: Secondary | ICD-10-CM

## 2017-05-23 DIAGNOSIS — Z9841 Cataract extraction status, right eye: Secondary | ICD-10-CM | POA: Diagnosis not present

## 2017-05-23 DIAGNOSIS — J441 Chronic obstructive pulmonary disease with (acute) exacerbation: Secondary | ICD-10-CM | POA: Diagnosis present

## 2017-05-23 DIAGNOSIS — I10 Essential (primary) hypertension: Secondary | ICD-10-CM | POA: Diagnosis present

## 2017-05-23 DIAGNOSIS — Z87891 Personal history of nicotine dependence: Secondary | ICD-10-CM | POA: Diagnosis not present

## 2017-05-23 DIAGNOSIS — I471 Supraventricular tachycardia: Secondary | ICD-10-CM | POA: Diagnosis not present

## 2017-05-23 DIAGNOSIS — Z9981 Dependence on supplemental oxygen: Secondary | ICD-10-CM

## 2017-05-23 DIAGNOSIS — I110 Hypertensive heart disease with heart failure: Secondary | ICD-10-CM | POA: Diagnosis present

## 2017-05-23 DIAGNOSIS — J9621 Acute and chronic respiratory failure with hypoxia: Secondary | ICD-10-CM | POA: Diagnosis present

## 2017-05-23 DIAGNOSIS — M1990 Unspecified osteoarthritis, unspecified site: Secondary | ICD-10-CM | POA: Diagnosis present

## 2017-05-23 DIAGNOSIS — Z9842 Cataract extraction status, left eye: Secondary | ICD-10-CM | POA: Diagnosis not present

## 2017-05-23 DIAGNOSIS — I5043 Acute on chronic combined systolic (congestive) and diastolic (congestive) heart failure: Secondary | ICD-10-CM | POA: Diagnosis present

## 2017-05-23 DIAGNOSIS — I5033 Acute on chronic diastolic (congestive) heart failure: Secondary | ICD-10-CM | POA: Diagnosis not present

## 2017-05-23 DIAGNOSIS — Z888 Allergy status to other drugs, medicaments and biological substances status: Secondary | ICD-10-CM | POA: Diagnosis not present

## 2017-05-23 DIAGNOSIS — R0602 Shortness of breath: Secondary | ICD-10-CM | POA: Diagnosis not present

## 2017-05-23 DIAGNOSIS — Z880 Allergy status to penicillin: Secondary | ICD-10-CM | POA: Diagnosis not present

## 2017-05-23 DIAGNOSIS — I5032 Chronic diastolic (congestive) heart failure: Secondary | ICD-10-CM | POA: Diagnosis present

## 2017-05-23 HISTORY — DX: Sialolithiasis: K11.5

## 2017-05-23 LAB — URINALYSIS, ROUTINE W REFLEX MICROSCOPIC
BACTERIA UA: NONE SEEN
Bilirubin Urine: NEGATIVE
Glucose, UA: NEGATIVE mg/dL
KETONES UR: NEGATIVE mg/dL
Leukocytes, UA: NEGATIVE
Nitrite: NEGATIVE
PH: 7 (ref 5.0–8.0)
Protein, ur: NEGATIVE mg/dL
SPECIFIC GRAVITY, URINE: 1.005 (ref 1.005–1.030)

## 2017-05-23 LAB — COMPREHENSIVE METABOLIC PANEL
ALK PHOS: 108 U/L (ref 38–126)
ALT: 33 U/L (ref 14–54)
AST: 40 U/L (ref 15–41)
Albumin: 3.5 g/dL (ref 3.5–5.0)
Anion gap: 10 (ref 5–15)
BILIRUBIN TOTAL: 0.5 mg/dL (ref 0.3–1.2)
BUN: 17 mg/dL (ref 6–20)
CALCIUM: 9.4 mg/dL (ref 8.9–10.3)
CO2: 29 mmol/L (ref 22–32)
CREATININE: 0.74 mg/dL (ref 0.44–1.00)
Chloride: 100 mmol/L — ABNORMAL LOW (ref 101–111)
Glucose, Bld: 105 mg/dL — ABNORMAL HIGH (ref 65–99)
Potassium: 3.7 mmol/L (ref 3.5–5.1)
Sodium: 139 mmol/L (ref 135–145)
Total Protein: 7.1 g/dL (ref 6.5–8.1)

## 2017-05-23 LAB — CBC WITH DIFFERENTIAL/PLATELET
Basophils Absolute: 0 10*3/uL (ref 0.0–0.1)
Basophils Relative: 0 %
EOS PCT: 3 %
Eosinophils Absolute: 0.3 10*3/uL (ref 0.0–0.7)
HEMATOCRIT: 40.6 % (ref 36.0–46.0)
HEMOGLOBIN: 13 g/dL (ref 12.0–15.0)
LYMPHS ABS: 1.1 10*3/uL (ref 0.7–4.0)
LYMPHS PCT: 10 %
MCH: 28.8 pg (ref 26.0–34.0)
MCHC: 32 g/dL (ref 30.0–36.0)
MCV: 90 fL (ref 78.0–100.0)
Monocytes Absolute: 1.1 10*3/uL — ABNORMAL HIGH (ref 0.1–1.0)
Monocytes Relative: 10 %
NEUTROS ABS: 8.6 10*3/uL — AB (ref 1.7–7.7)
NEUTROS PCT: 77 %
Platelets: 252 10*3/uL (ref 150–400)
RBC: 4.51 MIL/uL (ref 3.87–5.11)
RDW: 13.8 % (ref 11.5–15.5)
WBC: 11.2 10*3/uL — AB (ref 4.0–10.5)

## 2017-05-23 LAB — BRAIN NATRIURETIC PEPTIDE: B Natriuretic Peptide: 200 pg/mL — ABNORMAL HIGH (ref 0.0–100.0)

## 2017-05-23 LAB — D-DIMER, QUANTITATIVE (NOT AT ARMC): D DIMER QUANT: 1.12 ug{FEU}/mL — AB (ref 0.00–0.50)

## 2017-05-23 LAB — TROPONIN I

## 2017-05-23 MED ORDER — SODIUM CHLORIDE 0.9% FLUSH
3.0000 mL | Freq: Two times a day (BID) | INTRAVENOUS | Status: DC
  Administered 2017-05-23 – 2017-05-25 (×3): 3 mL via INTRAVENOUS

## 2017-05-23 MED ORDER — ACETAMINOPHEN 325 MG PO TABS
650.0000 mg | ORAL_TABLET | ORAL | Status: DC | PRN
Start: 2017-05-23 — End: 2017-05-25
  Administered 2017-05-24: 650 mg via ORAL
  Filled 2017-05-23: qty 2

## 2017-05-23 MED ORDER — IPRATROPIUM-ALBUTEROL 0.5-2.5 (3) MG/3ML IN SOLN
3.0000 mL | Freq: Four times a day (QID) | RESPIRATORY_TRACT | Status: DC
  Administered 2017-05-23 – 2017-05-24 (×3): 3 mL via RESPIRATORY_TRACT
  Filled 2017-05-23 (×3): qty 3

## 2017-05-23 MED ORDER — ALBUTEROL SULFATE (2.5 MG/3ML) 0.083% IN NEBU
2.5000 mg | INHALATION_SOLUTION | Freq: Once | RESPIRATORY_TRACT | Status: AC
  Administered 2017-05-23: 2.5 mg via RESPIRATORY_TRACT
  Filled 2017-05-23: qty 3

## 2017-05-23 MED ORDER — ENOXAPARIN SODIUM 40 MG/0.4ML ~~LOC~~ SOLN
40.0000 mg | SUBCUTANEOUS | Status: DC
  Administered 2017-05-23 – 2017-05-24 (×2): 40 mg via SUBCUTANEOUS
  Filled 2017-05-23 (×2): qty 0.4

## 2017-05-23 MED ORDER — IPRATROPIUM-ALBUTEROL 0.5-2.5 (3) MG/3ML IN SOLN
3.0000 mL | Freq: Once | RESPIRATORY_TRACT | Status: AC
  Administered 2017-05-23: 3 mL via RESPIRATORY_TRACT
  Filled 2017-05-23: qty 3

## 2017-05-23 MED ORDER — METHYLPREDNISOLONE SODIUM SUCC 125 MG IJ SOLR
125.0000 mg | Freq: Once | INTRAMUSCULAR | Status: AC
  Administered 2017-05-23: 125 mg via INTRAVENOUS
  Filled 2017-05-23: qty 2

## 2017-05-23 MED ORDER — CLONAZEPAM 0.5 MG PO TABS: 0.2500 mg | ORAL_TABLET | Freq: Every day | ORAL | Status: DC | PRN

## 2017-05-23 MED ORDER — POTASSIUM CHLORIDE CRYS ER 20 MEQ PO TBCR
20.0000 meq | EXTENDED_RELEASE_TABLET | Freq: Every day | ORAL | Status: DC
  Administered 2017-05-24 – 2017-05-25 (×2): 20 meq via ORAL
  Filled 2017-05-23 (×2): qty 1

## 2017-05-23 MED ORDER — SODIUM CHLORIDE 0.9 % IV SOLN: 250.0000 mL | INTRAVENOUS | Status: DC | PRN

## 2017-05-23 MED ORDER — LISINOPRIL 5 MG PO TABS
2.5000 mg | ORAL_TABLET | Freq: Every day | ORAL | Status: DC
  Administered 2017-05-24 – 2017-05-25 (×2): 2.5 mg via ORAL
  Filled 2017-05-23 (×2): qty 1

## 2017-05-23 MED ORDER — ENSURE ENLIVE PO LIQD
237.0000 mL | Freq: Two times a day (BID) | ORAL | Status: DC
  Administered 2017-05-24: 237 mL via ORAL

## 2017-05-23 MED ORDER — IOPAMIDOL (ISOVUE-370) INJECTION 76%
100.0000 mL | Freq: Once | INTRAVENOUS | Status: AC | PRN
  Administered 2017-05-23: 100 mL via INTRAVENOUS

## 2017-05-23 MED ORDER — FUROSEMIDE 10 MG/ML IJ SOLN
40.0000 mg | Freq: Once | INTRAMUSCULAR | Status: AC
  Administered 2017-05-23: 40 mg via INTRAVENOUS
  Filled 2017-05-23: qty 4

## 2017-05-23 MED ORDER — METHYLPREDNISOLONE SODIUM SUCC 125 MG IJ SOLR
60.0000 mg | Freq: Two times a day (BID) | INTRAMUSCULAR | Status: DC
  Administered 2017-05-24 (×2): 60 mg via INTRAVENOUS
  Filled 2017-05-23 (×2): qty 2

## 2017-05-23 MED ORDER — ALBUTEROL SULFATE (2.5 MG/3ML) 0.083% IN NEBU: 2.5000 mg | INHALATION_SOLUTION | RESPIRATORY_TRACT | Status: DC | PRN

## 2017-05-23 MED ORDER — METOPROLOL SUCCINATE ER 50 MG PO TB24
75.0000 mg | ORAL_TABLET | Freq: Every day | ORAL | Status: DC
  Administered 2017-05-24 – 2017-05-25 (×2): 75 mg via ORAL
  Filled 2017-05-23 (×2): qty 1

## 2017-05-23 MED ORDER — ONDANSETRON HCL 4 MG/2ML IJ SOLN: 4.0000 mg | Freq: Four times a day (QID) | INTRAMUSCULAR | Status: DC | PRN

## 2017-05-23 MED ORDER — ASPIRIN EC 81 MG PO TBEC
81.0000 mg | DELAYED_RELEASE_TABLET | Freq: Every day | ORAL | Status: DC
  Administered 2017-05-24 – 2017-05-25 (×2): 81 mg via ORAL
  Filled 2017-05-23 (×2): qty 1

## 2017-05-23 MED ORDER — ALBUTEROL SULFATE (2.5 MG/3ML) 0.083% IN NEBU
5.0000 mg | INHALATION_SOLUTION | Freq: Once | RESPIRATORY_TRACT | Status: AC
  Administered 2017-05-23: 5 mg via RESPIRATORY_TRACT
  Filled 2017-05-23: qty 6

## 2017-05-23 MED ORDER — FUROSEMIDE 10 MG/ML IJ SOLN
20.0000 mg | Freq: Once | INTRAMUSCULAR | Status: AC
  Administered 2017-05-24: 20 mg via INTRAVENOUS
  Filled 2017-05-23: qty 2

## 2017-05-23 MED ORDER — SODIUM CHLORIDE 0.9% FLUSH: 3.0000 mL | INTRAVENOUS | Status: DC | PRN

## 2017-05-23 NOTE — ED Notes (Signed)
Pt ambulated to restroom with standby assist. Steady gait.

## 2017-05-23 NOTE — ED Notes (Signed)
Pt O2 dropped to 85% on RA while ambulating, pt "winded" and dizzy.

## 2017-05-23 NOTE — H&P (Signed)
History and Physical    Hannah Mckee VPC:[id number] DOB: Nov 07, 1930 DOA: 05/23/2017  PCP: Sinda Du, MD Consultants:  None Patient coming from: home - lives alone; NOK: daughter, [phone]   Chief Complaint: SOB  HPI: Hannah Mckee is a 83 y.o. female with medical history significant of femur fracture; HTN; COPD (not on home O2); CHF; and PNA in 12/17 (admitted by me) presenting with SOB.  She does lots of cooking, baking, candy-making and she made candy all day Saturday. At bedtime, she had a funny feeling in her head and felt nauseated.  She checked her O2 sat and it was 80%, so she put on O2.  She couldn't stop shaking that night.  She has had "violent rigors" in the past and thought she was septic.  She went to Urgent Care in [place] "Sunday evening - diagnosed with kidney infection due to blood in urine (given Cipro 250 mg) but the doctor was reportedly more worried about her heart and lungs.  This AM, she was so out of breath she couldn't talk.  No fever.  Occasional nonproductive cough while using her nebulizer but otherwise none.  She is spitting up a bit of phlegm despite not coughing.  RLE edema, chronic in that leg she has broken when she eats too much salt - she went off her diet a few weeks ago.  Has been out to eat 4 meals in a week.   ED Course: SOB due to COPD and CHF.  Given nebs, IV Solumedrol, IV Lasix.  Sats 85% with ambulation and pt SOB and dizzy.  Review of Systems: As per HPI; otherwise review of systems reviewed and negative.   Ambulatory Status: Ambulates with a walker  Past Medical History:  Diagnosis Date  . CHF (congestive heart failure) (HCC)   . COPD (chronic obstructive pulmonary disease) (HCC)    no home O2  . Hypertension   . Kidney stones   . Osteoarthritis   . Salivary gland stone   . Sepsis (HCC)     Past Surgical History:  Procedure Laterality Date  . bilateral cataract surg    . BUNIONECTOMY    . CHOLECYSTECTOMY    .  COLONOSCOPY  11/16/2012   Procedure: COLONOSCOPY;  Surgeon: Najeeb U Rehman, MD;  Location: [facility];  Service: Endoscopy;  Laterality: N/A;  100  . LITHOTRIPSY    . ORIF PERIPROSTHETIC FRACTURE Right 10/29/2014   Procedure: OPEN REDUCTION INTERNAL FIXATION (ORIF) PERIPROSTHETIC FEMUR FRACTURE/FEMUR SHAFT;  Surgeon: Christopher Y Blackman, MD;  Location: [facility] OR;  Service: Orthopedics;  Laterality: Right;  . TONSILLECTOMY    . TOTAL HIP ARTHROPLASTY     19" 89    Social History   Social History  . Marital status: Divorced    Spouse name: N/A  . Number of children: N/A  . Years of education: N/A   Occupational History  . Not on file.   Social History Main Topics  . Smoking status: Former Smoker    Packs/day: 0.50    Years: 35.00    Types: Cigarettes    Quit date: 01/1997  . Smokeless tobacco: Never Used  . Alcohol use No  . Drug use: No  . Sexual activity: Not on file   Other Topics Concern  . Not on file   Social History Narrative  . No narrative on file    Allergies  Allergen Reactions  . Erythromycin Swelling  . Levaquin [Levofloxacin] Other (See Comments)    Tongue turns  red and sore  . Penicillins Swelling    Has patient had a PCN reaction causing immediate rash, facial/tongue/throat swelling, SOB or lightheadedness with hypotension: Yes Has patient had a PCN reaction causing severe rash involving mucus membranes or skin necrosis: No Has patient had a PCN reaction that required hospitalization No Has patient had a PCN reaction occurring within the last 10 years: No If all of the above answers are "NO", then may proceed with Cephalosporin use.   . Sulfa Antibiotics Other (See Comments)    Tongue turns red and sore    History reviewed. No pertinent family history.  Prior to Admission medications   Medication Sig Start Date End Date Taking? Authorizing Provider  ANORO ELLIPTA 62.5-25 MCG/INH AEPB Inhale 1 puff into the lungs daily. 02/17/17  Yes [provider]  aspirin EC 81 MG tablet Take 81 mg by mouth daily.   Yes [provider]  ciprofloxacin (CIPRO) 250 MG tablet Take 250 mg by mouth 2 (two) times daily.   Yes [provider]  clonazePAM (KLONOPIN) 0.5 MG tablet Take 0.125 mg by mouth daily as needed for anxiety.    Yes [provider]  feeding supplement, ENSURE ENLIVE, (ENSURE ENLIVE) LIQD Take 237 mLs by mouth 2 (two) times daily between meals. Patient taking differently: Take 237 mLs by mouth daily.  10/11/16  Yes Dhungel, Nishant, MD  furosemide (LASIX) 40 MG tablet Take 0.5 tablets (20 mg total) by mouth daily. 10/21/16  Yes Lendon Colonel, NP  lisinopril (PRINIVIL,ZESTRIL) 2.5 MG tablet TAKE 1 TABLET BY MOUTH EVERY DAY 01/24/17  Yes Branch, Alphonse Guild, MD  metoprolol succinate (TOPROL-XL) 50 MG 24 hr tablet TAKE 1 AND 1/2 TABLETS DAILY 05/03/17  Yes Branch, Alphonse Guild, MD  potassium chloride SA (K-DUR,KLOR-CON) 20 MEQ tablet Take 1 tablet (20 mEq total) by mouth 3 (three) times a week. Takes only when Furosemide is taken Patient taking differently: Take 20 mEq by mouth daily. Takes only when Furosemide is taken 10/22/16  Yes Lendon Colonel, NP    Physical Exam: Vitals:   05/23/17 1700 05/23/17 1810 05/23/17 1830 05/23/17 1940  BP: (!) 142/54  (!) 142/64 (!) 143/65  Pulse: 88  86 90  Resp: 16  (!) 24 20  Temp:      TempSrc:      SpO2: 96% 96% 97% 95%  Weight:      Height:         General: Appears calm and comfortable and is NAD Eyes:  PERRL, EOMI, normal lids, iris ENT:  grossly normal hearing, lips & tongue, mmm Neck:  no LAD, masses or thyromegaly Cardiovascular:  RRR, no m/r/g. No LE edema.  Respiratory:  Bibasilar crackles without significant wheezing. Normal respiratory effort. Abdomen:  soft, ntnd, NABS Skin:  no rash or induration seen on limited exam Musculoskeletal:  grossly normal tone BUE/BLE, good ROM, no bony abnormality Psychiatric:  grossly normal mood and  affect, speech fluent and appropriate, AOx3 Neurologic:  CN 2-12 grossly intact, moves all extremities in coordinated fashion, sensation intact  Labs on Admission: I have personally reviewed following labs and imaging studies  CBC:  Recent Labs Lab 05/23/17 1151  WBC 11.2*  NEUTROABS 8.6*  HGB 13.0  HCT 40.6  MCV 90.0  PLT 315   Basic Metabolic Panel:  Recent Labs Lab 05/23/17 1151  NA 139  K 3.7  CL 100*  CO2 29  GLUCOSE 105*  BUN 17  CREATININE 0.74  CALCIUM 9.4  GFR: Estimated Creatinine Clearance: 35.7 mL/min (by C-G formula based on SCr of 0.74 mg/dL). Liver Function Tests:  Recent Labs Lab 05/23/17 1151  AST 40  ALT 33  ALKPHOS 108  BILITOT 0.5  PROT 7.1  ALBUMIN 3.5   No results for input(s): LIPASE, AMYLASE in the last 168 hours. No results for input(s): AMMONIA in the last 168 hours. Coagulation Profile: No results for input(s): INR, PROTIME in the last 168 hours. Cardiac Enzymes:  Recent Labs Lab 05/23/17 1151  TROPONINI <0.03   BNP (last 3 results) No results for input(s): PROBNP in the last 8760 hours. HbA1C: No results for input(s): HGBA1C in the last 72 hours. CBG: No results for input(s): GLUCAP in the last 168 hours. Lipid Profile: No results for input(s): CHOL, HDL, LDLCALC, TRIG, CHOLHDL, LDLDIRECT in the last 72 hours. Thyroid Function Tests: No results for input(s): TSH, T4TOTAL, FREET4, T3FREE, THYROIDAB in the last 72 hours. Anemia Panel: No results for input(s): VITAMINB12, FOLATE, FERRITIN, TIBC, IRON, RETICCTPCT in the last 72 hours. Urine analysis:    Component Value Date/Time   COLORURINE STRAW (A) 05/23/2017 1151   APPEARANCEUR CLEAR 05/23/2017 1151   LABSPEC 1.005 05/23/2017 1151   PHURINE 7.0 05/23/2017 1151   GLUCOSEU NEGATIVE 05/23/2017 1151   HGBUR SMALL (A) 05/23/2017 1151   BILIRUBINUR NEGATIVE 05/23/2017 1151   KETONESUR NEGATIVE 05/23/2017 1151   PROTEINUR NEGATIVE 05/23/2017 1151   NITRITE NEGATIVE  05/23/2017 1151   LEUKOCYTESUR NEGATIVE 05/23/2017 1151    Creatinine Clearance: Estimated Creatinine Clearance: 35.7 mL/min (by C-G formula based on SCr of 0.74 mg/dL).  Sepsis Labs: @LABRCNTIP (procalcitonin:4,lacticidven:4) )No results found for this or any previous visit (from the past 240 hour(s)).   Radiological Exams on Admission: Dg Chest 2 View  Result Date: 05/23/2017 CLINICAL DATA:  Former smoker with current history of COPD and CHF presenting with three-day history of cough and shortness of breath. EXAM: CHEST  2 VIEW COMPARISON:  10/07/2016, 10/04/2016 and earlier. FINDINGS: The patient's chin overlies the medial lung apices. Cardiac silhouette upper normal in size to slightly enlarged, unchanged. Thoracic aorta tortuous and markedly atherosclerotic, unchanged. Hilar and mediastinal contours otherwise unremarkable. Emphysematous changes throughout both lungs, unchanged. Blunting of both costophrenic angles, best seen on lateral image. Linear scarring in the lower lobes, unchanged. Lungs otherwise clear. Pulmonary vascularity normal. Exaggeration of the usual thoracic kyphosis with generalized osseous demineralization as noted previously. IMPRESSION: 1. COPD/emphysema. Borderline to mild cardiomegaly. No acute cardiopulmonary disease. 2. Chronic pleuroparenchymal scarring at the bases versus small chronic bilateral pleural effusions. Scarring is favored. 3. Thoracic aortic atherosclerosis. Aortic Atherosclerosis (ICD10-170.0) Emphysema. ([id number]) Electronically Signed   By: Evangeline Dakin M.D.   On: 05/23/2017 12:14   Ct Angio Chest Pe W And/or Wo Contrast  Result Date: 05/23/2017 CLINICAL DATA:  Cough and shortness of breath for several days EXAM: CT ANGIOGRAPHY CHEST WITH CONTRAST TECHNIQUE: Multidetector CT imaging of the chest was performed using the standard protocol during bolus administration of intravenous contrast. Multiplanar CT image reconstructions and MIPs were  obtained to evaluate the vascular anatomy. CONTRAST:  100 mL Isovue 370. COMPARISON:  Plain film from earlier in the same day. FINDINGS: Cardiovascular: Diffuse atherosclerotic calcifications of the thoracic aorta are noted. The origins of the great vessels are patent but also shows some mild calcifications. No aneurysmal dilatation or dissection is seen. No significant cardiac enlargement is noted. Diffuse coronary calcifications are noted. The pulmonary artery shows a normal branching pattern without evidence of definitive pulmonary embolism.  Mediastinum/Nodes: Thoracic inlet demonstrates some nodular changes within the right lobe of the thyroid. Scattered calcifications are noted as well. No significant hilar or mediastinal adenopathy is identified. Scattered calcified hilar lymph nodes are noted particularly on the right consistent with prior granulomatous disease. The esophagus is within normal limits. Lungs/Pleura: Lungs are well aerated bilaterally. Some minimal scarring is noted in the right lower lobe. No focal infiltrate or sizable effusion is seen. Upper Abdomen: Right renal cyst is noted. Changes of prior cholecystectomy are seen. Musculoskeletal: Severe kyphosis of the thoracic spine is noted. No definitive compression deformity is seen. Mild osteophytic changes are noted. Review of the MIP images confirms the above findings. IMPRESSION: No evidence of pulmonary emboli. Chronic changes as described above. Aortic Atherosclerosis (ICD10-I70.0). Electronically Signed   By: Inez Catalina M.D.   On: 05/23/2017 16:04   US Venous Img Lower Unilateral Right  Result Date: 05/23/2017 CLINICAL DATA:  Initial evaluation for acute right lower extremity edema with swelling for 1 week. EXAM: Right LOWER EXTREMITY VENOUS DOPPLER ULTRASOUND TECHNIQUE: Gray-scale sonography with graded compression, as well as color Doppler and duplex ultrasound were performed to evaluate the lower extremity deep venous systems from  the level of the common femoral vein and including the common femoral, femoral, profunda femoral, popliteal and calf veins including the posterior tibial, peroneal and gastrocnemius veins when visible. The superficial great saphenous vein was also interrogated. Spectral Doppler was utilized to evaluate flow at rest and with distal augmentation maneuvers in the common femoral, femoral and popliteal veins. COMPARISON:  None. FINDINGS: Contralateral Common Femoral Vein: Respiratory phasicity is normal and symmetric with the symptomatic side. No evidence of thrombus. Normal compressibility. Common Femoral Vein: No evidence of thrombus. Normal compressibility, respiratory phasicity and response to augmentation. Saphenofemoral Junction: No evidence of thrombus. Normal compressibility and flow on color Doppler imaging. Profunda Femoral Vein: No evidence of thrombus. Normal compressibility and flow on color Doppler imaging. Femoral Vein: No evidence of thrombus. Normal compressibility, respiratory phasicity and response to augmentation. Popliteal Vein: No evidence of thrombus. Normal compressibility, respiratory phasicity and response to augmentation. Calf Veins: No evidence of thrombus. Normal compressibility and flow on color Doppler imaging. Superficial Great Saphenous Vein: No evidence of thrombus. Normal compressibility and flow on color Doppler imaging. Venous Reflux:  None. Other Findings:  None. IMPRESSION: No evidence of DVT within the right lower extremity. Electronically Signed   By: Jeannine Boga M.D.   On: 05/23/2017 17:56    EKG: Independently reviewed.  NSR with rate 90; no evidence of acute ischemia  Assessment/Plan Principal Problem:   Acute on chronic respiratory failure with hypoxia (HCC) Active Problems:   Hypertension   COPD exacerbation (HCC)   Chronic diastolic CHF (congestive heart failure) (HCC)   Acute on chronic respiratory failure with hypoxia -Patient who is on home O2  presenting with ambulatory O2 sats in 80s and c/o SOB -She did not have increased respiratory effort at the time of my evaluation, but she had not been exerting herself recently -Patient has known h/o grade 1 diastolic CHF (see below); suspect that this is contributing but may not be primarily responsible for current presentation -Patient also with known COPD on home O2; suspect that this is her primary issue currently (see below)  COPD -Patient's shortness of breath appears to be most likely caused by acute COPD exacerbation - she does not have fever and has only mild  Leukocytosis (11.2). Chest x-ray is not consistent with pneumonia. -will admit patient to  telemetry bed  -Nebulizers: scheduled Duoneb and prn albuterol -Solu-Medrol 60 mg IV BID -No antibiotics at this time as she has only 1 of the cardinal symptoms.   Chronic diastolic CHF  -Elevated BNP to 200, prior 459 in 12/17 -Echo 6/18 - EF 43-83%, grade 1 diastolic dysfunction -CXR does not clearly indicate pulmonary edema -Initial troponin negative -Will continue ASA -Will continue Lisinopril and Toprol -CHF order set utilized -Was given Lasix 20 mg x 1 in ER and will repeat with 20 mg x 1 tomorrow AM -Continue [place] O2 for now -Normal kidney function at this time, will follow -Repeat EKG in AM  HTN -Continue ACE and beta-blocker -Will follow as inpatient  DVT prophylaxis: Lovenox  Code Status:  Full - confirmed with patient/family Family Communication: Daughter present throughout  Disposition Plan:  Home once clinically improved Consults called: CM/SW/RT/Nutrition/PT/OT Admission status: Admit - It is my clinical opinion that admission to INPATIENT is reasonable and necessary because this patient will require at least 2 midnights in the hospital to treat this condition based on the medical complexity of the problems presented.  Given the aforementioned information, the predictability of an adverse outcome is felt to be  significant.    Karmen Bongo MD [facility]  If 7PM-7AM, please contact night-coverage [email or web address] Password [facility]  05/23/2017, 8:28 PM

## 2017-05-23 NOTE — ED Notes (Signed)
Pt reports she was seen at urgent care yesterday and was dx with a "kidney infection" has taken 2 doses of Cipro. Also reports RLE swelling increased in the past few days, productive cough with white sputum. Denies CP.

## 2017-05-23 NOTE — Telephone Encounter (Signed)
Patients daughter spoke with nurse in [place]. Patient going to the ER at [facility]

## 2017-05-23 NOTE — ED Provider Notes (Signed)
[place] DEPT Provider Note   CSN: [id number] Arrival date & time: 05/23/17  1118     History   Chief Complaint Chief Complaint  Patient presents with  . Shortness of Breath    HPI Hannah Mckee is a 83 y.o. female.  Patient complains of shortness of breath.   The history is provided by the patient. No language interpreter was used.  Shortness of Breath  This is a new problem. The problem occurs frequently.The current episode started yesterday. The problem has not changed since onset.Pertinent negatives include no fever, no headaches, no cough, no chest pain, no abdominal pain and no rash. The problem's precipitants include exercise. Risk factors include recent prolonged sitting. She has tried nothing for the symptoms. The treatment provided mild relief. She has had prior hospitalizations. She has had prior ED visits. She has had no prior ICU admissions. Associated medical issues include COPD.    Past Medical History:  Diagnosis Date  . CHF (congestive heart failure) ([place])   . COPD (chronic obstructive pulmonary disease) (H[facility])    no home O2  . Hypertension   . Kidney stones   . Osteoarthritis   . Sepsis [facility])     Patient Active Problem List   Diagnosis Date Noted  . COPD exacerbation ([place]) 10/11/2016  . Muscle weakness (generalized)   . Sinus tachycardia   . Acute systolic CHF (congestive heart failure) ([place])   . Lobar pneumonia, unspecified organism ([place])   . CAP (community acquired pneumonia) 10/04/2016  . Sepsis ([place]) 10/04/2016  . Acute respiratory failure with hypoxia ([place]) 10/04/2016  . Elevated troponin 10/04/2016  . Hypokalemia 10/04/2016  . Moderate malnutrition ([place]) 10/04/2016  . Right femoral fracture ([place]) 10/28/2014  . COPD (chronic obstructive pulmonary disease) ([place])   . Fall   . Unspecified constipation 04/16/2013  . Diverticulitis of colon without hemorrhage 04/16/2013  . Rectal bleed 11/06/2012  . Hypertension 11/06/2012    Past  Surgical History:  Procedure Laterality Date  . bilateral cataract surg    . BUNIONECTOMY    . CHOLECYSTECTOMY    . COLONOSCOPY  11/16/2012   Procedure: COLONOSCOPY;  Surgeon: Rogene Houston, MD;  Location[facility];  Service: Endoscopy;  Laterality: N/A;  100  . LITHOTRIPSY    . ORIF PERIPROSTHETIC FRACTURE Right 10/29/2014   Procedure: OPEN REDUCTION INTERNAL FIXATION (ORIF) PERIPROSTHETIC FEMUR FRACTURE/FEMUR SHAFT;  Surgeon: Mcarthur Rossetti, MD;  Location: [place];  Service: Orthopedics;  Laterality: Right;  . TONSILLECTOMY    . TOTAL HIP ARTHROPLASTY     1989    OB History    Gravida Para Term Preterm AB Living             4   SAB TAB Ectopic Multiple Live Births                   Home Medications    Prior to Admission medications   Medication Sig Start Date End Date Taking? Authorizing Provider  ANORO ELLIPTA 62.5-25 MCG/INH AEPB Inhale 1 puff into the lungs daily. 02/17/17  Yes [provider]  aspirin EC 81 MG tablet Take 81 mg by mouth daily.   Yes [provider]  ciprofloxacin (CIPRO) 250 MG tablet Take 250 mg by mouth 2 (two) times daily.   Yes [provider]  clonazePAM (KLONOPIN) 0.5 MG tablet Take 0.125 mg by mouth daily as needed for anxiety.    Yes [provider]  feeding supplement, ENSURE ENLIVE, (  ENSURE ENLIVE) LIQD Take 237 mLs by mouth 2 (two) times daily between meals. Patient taking differently: Take 237 mLs by mouth daily.  10/11/16  Yes Dhungel, Nishant, MD  furosemide (LASIX) 40 MG tablet Take 0.5 tablets (20 mg total) by mouth daily. 10/21/16  Yes Lendon Colonel, NP  lisinopril (PRINIVIL,ZESTRIL) 2.5 MG tablet TAKE 1 TABLET BY MOUTH EVERY DAY 01/24/17  Yes Branch, Alphonse Guild, MD  metoprolol succinate (TOPROL-XL) 50 MG 24 hr tablet TAKE 1 AND 1/2 TABLETS DAILY 05/03/17  Yes Branch, Alphonse Guild, MD  potassium chloride SA (K-DUR,KLOR-CON) 20 MEQ tablet Take 1 tablet (20 mEq total) by mouth 3 (three) times a  week. Takes only when Furosemide is taken Patient taking differently: Take 20 mEq by mouth daily. Takes only when Furosemide is taken 10/22/16  Yes Lendon Colonel, NP    Family History No family history on file.  Social History Social History  Substance Use Topics  . Smoking status: Former Smoker    Types: Cigarettes    Quit date: 01/1997  . Smokeless tobacco: Never Used  . Alcohol use No     Allergies   Erythromycin; Levaquin [levofloxacin]; Penicillins; and Sulfa antibiotics   Review of Systems Review of Systems  Constitutional: Negative for appetite change, fatigue and fever.  HENT: Negative for congestion, ear discharge and sinus pressure.   Eyes: Negative for discharge.  Respiratory: Positive for shortness of breath. Negative for cough.   Cardiovascular: Negative for chest pain.  Gastrointestinal: Negative for abdominal pain and diarrhea.  Genitourinary: Negative for frequency and hematuria.  Musculoskeletal: Negative for back pain.  Skin: Negative for rash.  Neurological: Negative for seizures and headaches.  Psychiatric/Behavioral: Negative for hallucinations.     Physical Exam Updated Vital Signs BP (!) 128/52   Pulse 84   Temp (!) 97.5 F (36.4 C) (Oral)   Resp 17   Ht 4\' 8"  (1.422 m)   Wt 53.5 kg (118 lb)   SpO2 97%   BMI 26.46 kg/m   Physical Exam  Constitutional: She is oriented to person, place, and time. She appears well-developed.  HENT:  Head: Normocephalic.  Eyes: Conjunctivae and EOM are normal. No scleral icterus.  Neck: Neck supple. No thyromegaly present.  Cardiovascular: Normal rate and regular rhythm.  Exam reveals no gallop and no friction rub.   No murmur heard. Pulmonary/Chest: No stridor. She has wheezes. She has no rales. She exhibits no tenderness.  Abdominal: She exhibits no distension. There is no tenderness. There is no rebound.  Musculoskeletal: Normal range of motion. She exhibits no edema.  Lymphadenopathy:    She  has no cervical adenopathy.  Neurological: She is oriented to person, place, and time. She exhibits normal muscle tone. Coordination normal.  Skin: No rash noted. No erythema.  Psychiatric: She has a normal mood and affect. Her behavior is normal.     ED Treatments / Results  Labs (all labs ordered are listed, but only abnormal results are displayed) Labs Reviewed  CBC WITH DIFFERENTIAL/PLATELET - Abnormal; Notable for the following:       Result Value   WBC 11.2 (*)    Neutro Abs 8.6 (*)    Monocytes Absolute 1.1 (*)    All other components within normal limits  COMPREHENSIVE METABOLIC PANEL - Abnormal; Notable for the following:    Chloride 100 (*)    Glucose, Bld 105 (*)    All other components within normal limits  D-DIMER, QUANTITATIVE (NOT AT [facility]) - Abnormal;  Notable for the following:    D-Dimer, Quant 1.12 (*)    All other components within normal limits  BRAIN NATRIURETIC PEPTIDE - Abnormal; Notable for the following:    B Natriuretic Peptide 200.0 (*)    All other components within normal limits  URINALYSIS, ROUTINE W REFLEX MICROSCOPIC - Abnormal; Notable for the following:    Color, Urine STRAW (*)    Hgb urine dipstick SMALL (*)    Squamous Epithelial / LPF 0-5 (*)    All other components within normal limits  TROPONIN I    EKG  EKG Interpretation None       Radiology Dg Chest 2 View  Result Date: 05/23/2017 CLINICAL DATA:  Former smoker with current history of COPD and CHF presenting with three-day history of cough and shortness of breath. EXAM: CHEST  2 VIEW COMPARISON:  10/07/2016, 10/04/2016 and earlier. FINDINGS: The patient's chin overlies the medial lung apices. Cardiac silhouette upper normal in size to slightly enlarged, unchanged. Thoracic aorta tortuous and markedly atherosclerotic, unchanged. Hilar and mediastinal contours otherwise unremarkable. Emphysematous changes throughout both lungs, unchanged. Blunting of both costophrenic angles, best  seen on lateral image. Linear scarring in the lower lobes, unchanged. Lungs otherwise clear. Pulmonary vascularity normal. Exaggeration of the usual thoracic kyphosis with generalized osseous demineralization as noted previously. IMPRESSION: 1. COPD/emphysema. Borderline to mild cardiomegaly. No acute cardiopulmonary disease. 2. Chronic pleuroparenchymal scarring at the bases versus small chronic bilateral pleural effusions. Scarring is favored. 3. Thoracic aortic atherosclerosis. Aortic Atherosclerosis (ICD10-170.0) Emphysema. ([id number]-V03.9) Electronically Signed   By: Evangeline Dakin M.D.   On: 05/23/2017 12:14    Procedures Procedures (including critical care time)  Medications Ordered in ED Medications  albuterol (PROVENTIL) (2.5 MG/3ML) 0.083% nebulizer solution 5 mg (5 mg Nebulization Given 05/23/17 1209)  iopamidol (ISOVUE-370) 76 % injection 100 mL (100 mLs Intravenous Contrast Given 05/23/17 1541)     Initial Impression / Assessment and Plan / ED Course  I have reviewed the triage vital signs and the nursing notes.  Pertinent labs & imaging results that were available during my care of the patient were reviewed by me and considered in my medical decision making (see chart for details).     Patient with shortness of breath COPD and congestive heart failure. CT chest pending  Final Clinical Impressions(s) / ED Diagnoses   Final diagnoses:  None    New Prescriptions New Prescriptions   No medications on file     Milton Ferguson, MD 05/23/17 1554

## 2017-05-23 NOTE — ED Notes (Signed)
US at bedside

## 2017-05-23 NOTE — ED Notes (Signed)
Report given to Hui, RN. 

## 2017-05-23 NOTE — Telephone Encounter (Signed)
Daughter called stating that she took her mother to an Urgent Care on Sunday. Was told that she had fluid around her heart and lungs.  Appointment was given to see K. Lawrence today at [facility].  Daughter is concerned if she should go ahead and take her mother to the ER.

## 2017-05-23 NOTE — ED Provider Notes (Signed)
Pt received at sign out with labs, CT pending. Pt c/o SOB for the past few days, RLE swelling x1 week. Hx CHF and COPD. Workup below. CT-A negative for PE; Vasc US negative for DVT RLE. Short neb initially given and pt ambulated with Sats dropping to 85% R/A and pt c/o increasing SOB and "dizziness."  Another neb, IV solumedrol and IV lasix given. Dx and testing d/w pt and family.  Questions answered.  Verb understanding, agreeable to admit. T/C to [facility]. Lorin Mercy, case discussed, including:  HPI, pertinent PM/SHx, VS/PE, dx testing, ED course and treatment:  Agreeable to admit.   Results for orders placed or performed during the hospital encounter of 05/23/17  CBC with Differential/Platelet  Result Value Ref Range   WBC 11.2 (H) 4.0 - 10.5 K/uL   RBC 4.51 3.87 - 5.11 MIL/uL   Hemoglobin 13.0 12.0 - 15.0 g/dL   HCT 40.6 36.0 - 46.0 %   MCV 90.0 78.0 - 100.0 fL   MCH 28.8 26.0 - 34.0 pg   MCHC 32.0 30.0 - 36.0 g/dL   RDW 13.8 11.5 - 15.5 %   Platelets 252 150 - 400 K/uL   Neutrophils Relative % 77 %   Neutro Abs 8.6 (H) 1.7 - 7.7 K/uL   Lymphocytes Relative 10 %   Lymphs Abs 1.1 0.7 - 4.0 K/uL   Monocytes Relative 10 %   Monocytes Absolute 1.1 (H) 0.1 - 1.0 K/uL   Eosinophils Relative 3 %   Eosinophils Absolute 0.3 0.0 - 0.7 K/uL   Basophils Relative 0 %   Basophils Absolute 0.0 0.0 - 0.1 K/uL  Comprehensive metabolic panel  Result Value Ref Range   Sodium 139 135 - 145 mmol/L   Potassium 3.7 3.5 - 5.1 mmol/L   Chloride 100 (L) 101 - 111 mmol/L   CO2 29 22 - 32 mmol/L   Glucose, Bld 105 (H) 65 - 99 mg/dL   BUN 17 6 - 20 mg/dL   Creatinine, Ser 0.74 0.44 - 1.00 mg/dL   Calcium 9.4 8.9 - 10.3 mg/dL   Total Protein 7.1 6.5 - 8.1 g/dL   Albumin 3.5 3.5 - 5.0 g/dL   AST 40 15 - 41 U/L   ALT 33 14 - 54 U/L   Alkaline Phosphatase 108 38 - 126 U/L   Total Bilirubin 0.5 0.3 - 1.2 mg/dL   GFR calc non Af Amer >60 >60 mL/min   GFR calc Af Amer >60 >60 mL/min   Anion gap 10 5 - 15   D-dimer, quantitative (not at [facility])  Result Value Ref Range   D-Dimer, Quant 1.12 (H) 0.00 - 0.50 ug/mL-FEU  Troponin I  Result Value Ref Range   Troponin I <0.03 <0.03 ng/mL  Brain natriuretic peptide  Result Value Ref Range   B Natriuretic Peptide 200.0 (H) 0.0 - 100.0 pg/mL  Urinalysis, Routine w reflex microscopic  Result Value Ref Range   Color, Urine STRAW (A) YELLOW   APPearance CLEAR CLEAR   Specific Gravity, Urine 1.005 1.005 - 1.030   pH 7.0 5.0 - 8.0   Glucose, UA NEGATIVE NEGATIVE mg/dL   Hgb urine dipstick SMALL (A) NEGATIVE   Bilirubin Urine NEGATIVE NEGATIVE   Ketones, ur NEGATIVE NEGATIVE mg/dL   Protein, ur NEGATIVE NEGATIVE mg/dL   Nitrite NEGATIVE NEGATIVE   Leukocytes, UA NEGATIVE NEGATIVE   RBC / HPF 0-5 0 - 5 RBC/hpf   WBC, UA 0-5 0 - 5 WBC/hpf   Bacteria, UA NONE  SEEN NONE SEEN   Squamous Epithelial / LPF 0-5 (A) NONE SEEN   Dg Chest 2 View Result Date: 05/23/2017 CLINICAL DATA:  Former smoker with current history of COPD and CHF presenting with three-day history of cough and shortness of breath. EXAM: CHEST  2 VIEW COMPARISON:  10/07/2016, 10/04/2016 and earlier. FINDINGS: The patient's chin overlies the medial lung apices. Cardiac silhouette upper normal in size to slightly enlarged, unchanged. Thoracic aorta tortuous and markedly atherosclerotic, unchanged. Hilar and mediastinal contours otherwise unremarkable. Emphysematous changes throughout both lungs, unchanged. Blunting of both costophrenic angles, best seen on lateral image. Linear scarring in the lower lobes, unchanged. Lungs otherwise clear. Pulmonary vascularity normal. Exaggeration of the usual thoracic kyphosis with generalized osseous demineralization as noted previously. IMPRESSION: 1. COPD/emphysema. Borderline to mild cardiomegaly. No acute cardiopulmonary disease. 2. Chronic pleuroparenchymal scarring at the bases versus small chronic bilateral pleural effusions. Scarring is favored. 3. Thoracic  aortic atherosclerosis. Aortic Atherosclerosis (ICD10-170.0) Emphysema. ([id number]-P38.9) Electronically Signed   By: Evangeline Dakin M.D.   On: 05/23/2017 12:14   Ct Angio Chest Pe W And/or Wo Contrast Result Date: 05/23/2017 CLINICAL DATA:  Cough and shortness of breath for several days EXAM: CT ANGIOGRAPHY CHEST WITH CONTRAST TECHNIQUE: Multidetector CT imaging of the chest was performed using the standard protocol during bolus administration of intravenous contrast. Multiplanar CT image reconstructions and MIPs were obtained to evaluate the vascular anatomy. CONTRAST:  100 mL Isovue 370. COMPARISON:  Plain film from earlier in the same day. FINDINGS: Cardiovascular: Diffuse atherosclerotic calcifications of the thoracic aorta are noted. The origins of the great vessels are patent but also shows some mild calcifications. No aneurysmal dilatation or dissection is seen. No significant cardiac enlargement is noted. Diffuse coronary calcifications are noted. The pulmonary artery shows a normal branching pattern without evidence of definitive pulmonary embolism. Mediastinum/Nodes: Thoracic inlet demonstrates some nodular changes within the right lobe of the thyroid. Scattered calcifications are noted as well. No significant hilar or mediastinal adenopathy is identified. Scattered calcified hilar lymph nodes are noted particularly on the right consistent with prior granulomatous disease. The esophagus is within normal limits. Lungs/Pleura: Lungs are well aerated bilaterally. Some minimal scarring is noted in the right lower lobe. No focal infiltrate or sizable effusion is seen. Upper Abdomen: Right renal cyst is noted. Changes of prior cholecystectomy are seen. Musculoskeletal: Severe kyphosis of the thoracic spine is noted. No definitive compression deformity is seen. Mild osteophytic changes are noted. Review of the MIP images confirms the above findings. IMPRESSION: No evidence of pulmonary emboli. Chronic changes  as described above. Aortic Atherosclerosis (ICD10-I70.0). Electronically Signed   By: Inez Catalina M.D.   On: 05/23/2017 16:04   US Venous Img Lower Unilateral Right Result Date: 05/23/2017 CLINICAL DATA:  Initial evaluation for acute right lower extremity edema with swelling for 1 week. EXAM: Right LOWER EXTREMITY VENOUS DOPPLER ULTRASOUND TECHNIQUE: Gray-scale sonography with graded compression, as well as color Doppler and duplex ultrasound were performed to evaluate the lower extremity deep venous systems from the level of the common femoral vein and including the common femoral, femoral, profunda femoral, popliteal and calf veins including the posterior tibial, peroneal and gastrocnemius veins when visible. The superficial great saphenous vein was also interrogated. Spectral Doppler was utilized to evaluate flow at rest and with distal augmentation maneuvers in the common femoral, femoral and popliteal veins. COMPARISON:  None. FINDINGS: Contralateral Common Femoral Vein: Respiratory phasicity is normal and symmetric with the symptomatic side. No evidence of thrombus.  Normal compressibility. Common Femoral Vein: No evidence of thrombus. Normal compressibility, respiratory phasicity and response to augmentation. Saphenofemoral Junction: No evidence of thrombus. Normal compressibility and flow on color Doppler imaging. Profunda Femoral Vein: No evidence of thrombus. Normal compressibility and flow on color Doppler imaging. Femoral Vein: No evidence of thrombus. Normal compressibility, respiratory phasicity and response to augmentation. Popliteal Vein: No evidence of thrombus. Normal compressibility, respiratory phasicity and response to augmentation. Calf Veins: No evidence of thrombus. Normal compressibility and flow on color Doppler imaging. Superficial Great Saphenous Vein: No evidence of thrombus. Normal compressibility and flow on color Doppler imaging. Venous Reflux:  None. Other Findings:  None.  IMPRESSION: No evidence of DVT within the right lower extremity. Electronically Signed   By: Jeannine Boga M.D.   On: 05/23/2017 17:56      Francine Graven, DO 05/23/17 1844

## 2017-05-23 NOTE — Telephone Encounter (Signed)
Pt's daughter called to inform us that her mother was seen yesterday at urgent care because she was feeling very weak. Pt was informed that she was retaining fluid around her heart and lungs. She was told that she needed to be seen by her cardiologist ASAP to address those issues. I advised pt's daughter that if it was an immediate concern she should go be evaluated in the ED. She agreed that she would take her mother today as she has gotten worse overnight. I advised Dr. Harl Bowie of the issue.

## 2017-05-23 NOTE — ED Triage Notes (Signed)
Reports of cough and shortness of breath started Saturday. Is currently taking Lasix (increased to 40mg ) yesterday with no relief. Denies pain.

## 2017-05-24 LAB — CBC WITH DIFFERENTIAL/PLATELET
BASOS ABS: 0 10*3/uL (ref 0.0–0.1)
BASOS PCT: 0 %
Eosinophils Absolute: 0 10*3/uL (ref 0.0–0.7)
Eosinophils Relative: 0 %
HCT: 39.9 % (ref 36.0–46.0)
HEMOGLOBIN: 13 g/dL (ref 12.0–15.0)
Lymphocytes Relative: 10 %
Lymphs Abs: 0.7 10*3/uL (ref 0.7–4.0)
MCH: 28.8 pg (ref 26.0–34.0)
MCHC: 32.6 g/dL (ref 30.0–36.0)
MCV: 88.3 fL (ref 78.0–100.0)
Monocytes Absolute: 0.1 10*3/uL (ref 0.1–1.0)
Monocytes Relative: 1 %
NEUTROS ABS: 6.6 10*3/uL (ref 1.7–7.7)
NEUTROS PCT: 89 %
Platelets: 257 10*3/uL (ref 150–400)
RBC: 4.52 MIL/uL (ref 3.87–5.11)
RDW: 13.7 % (ref 11.5–15.5)
WBC: 7.4 10*3/uL (ref 4.0–10.5)

## 2017-05-24 LAB — BASIC METABOLIC PANEL
ANION GAP: 12 (ref 5–15)
BUN: 19 mg/dL (ref 6–20)
CALCIUM: 9.4 mg/dL (ref 8.9–10.3)
CO2: 29 mmol/L (ref 22–32)
Chloride: 97 mmol/L — ABNORMAL LOW (ref 101–111)
Creatinine, Ser: 0.79 mg/dL (ref 0.44–1.00)
GFR calc non Af Amer: >60 mL/min (ref >60)
Glucose, Bld: 169 mg/dL — ABNORMAL HIGH (ref 65–99)
Potassium: 3.6 mmol/L (ref 3.5–5.1)
Sodium: 138 mmol/L (ref 135–145)

## 2017-05-24 MED ORDER — LEVALBUTEROL HCL 0.63 MG/3ML IN NEBU: 0.6300 mg | INHALATION_SOLUTION | Freq: Four times a day (QID) | RESPIRATORY_TRACT | Status: DC | PRN

## 2017-05-24 MED ORDER — CIPROFLOXACIN HCL 250 MG PO TABS
250.0000 mg | ORAL_TABLET | Freq: Two times a day (BID) | ORAL | Status: DC
  Administered 2017-05-24 – 2017-05-25 (×3): 250 mg via ORAL
  Filled 2017-05-24 (×3): qty 1

## 2017-05-24 MED ORDER — METOPROLOL TARTRATE 5 MG/5ML IV SOLN: 2.5000 mg | Freq: Four times a day (QID) | INTRAVENOUS | Status: DC | PRN

## 2017-05-24 NOTE — Clinical Social Work Note (Signed)
Patient does not meet COPD Gold protocol. Patient has not had three admission in the required six months.     LCSW signing off.

## 2017-05-24 NOTE — Evaluation (Signed)
Physical Therapy Evaluation Patient Details Name: Hannah Mckee MRN: [id number] DOB: 1930-11-30 Today's Date: 05/24/2017   History of Present Illness  Hannah Mckee is a 83 y.o. female with medical history significant of femur fracture; HTN; COPD (not on home O2); CHF; and PNA in 12/17 (admitted by me) presenting with SOB.  She does lots of cooking, baking, candy-making and she made candy all day Saturday. At bedtime, she had a funny feeling in her head and felt nauseated.  She checked her O2 sat and it was 80%, so she put on O2.  She couldn't stop shaking that night.  She has had "violent rigors" in the past and thought she was septic.  She went to Urgent Care in [facility] Sunday evening - diagnosed with kidney infection due to blood in urine (given Cipro 250 mg) but the doctor was reportedly more worried about her heart and lungs.  This AM, she was so out of breath she couldn't talk.  No fever.  Occasional nonproductive cough while using her nebulizer but otherwise none.  She is spitting up a bit of phlegm despite not coughing.  RLE edema, chronic in that leg she has broken when she eats too much salt - she went off her diet a few weeks ago.  Has been out to eat 4 meals in a week  Clinical Impression  Pt received in chair and was agreeable to PT evaluation. Pt from home alone, she uses a rollator for community distances and typically no AD when in the house, however, she states that she will use it intermittently if she feels her leg getting weak. Pt was mod I to independent with transfers this date and mod I for ambualation 362ft. Pt's gait WFL and no significant unsteadiness was noted, but she did verbalize that she just felt a little weak. At this time, pt does not need any acute or f/u PT services as she is functioning at her baseline. PT encouraged pt to use her rollator in the house once cleared for d/c until her strength fully returns and she verbalized understanding. PT signing off for now.     Follow Up Recommendations No PT follow up    Equipment Recommendations  None recommended by PT    Recommendations for Other Services       Precautions / Restrictions Precautions Precautions: None Restrictions Weight Bearing Restrictions: No      Mobility  Bed Mobility                  Transfers Overall transfer level: Modified independent Equipment used: None                Ambulation/Gait Ambulation/Gait assistance: Modified independent (Device/Increase time) Ambulation Distance (Feet): 350 Feet Assistive device: Rolling walker (2 wheeled) Gait Pattern/deviations: Step-through pattern;Decreased stride length;Trunk flexed;Wide base of support Gait velocity: WFL   General Gait Details: looks at ground the whole time; when asked to look up and watch where she is going, pt stated that she can't because of her neck/kyphosis. pt steady throughout gait with RW  Stairs            Wheelchair Mobility    Modified Rankin (Stroke Patients Only)       Balance Overall balance assessment: Needs assistance Sitting-balance support: Feet supported Sitting balance-Leahy Scale: Good     Standing balance support: Bilateral upper extremity supported;No upper extremity supported Standing balance-Leahy Scale: Fair Standing balance comment: used RW for ambulation, HHA to no A for amb  to toilet and toilet transfer                             Pertinent Vitals/Pain Pain Assessment: No/denies pain    Home Living Family/patient expects to be discharged to:: Private residence Living Arrangements: Alone Available Help at Discharge: Family (2 daughters check in daily) Type of Home: Apartment Home Access: Elevator     Home Layout: Two level;Able to live on main level with bedroom/bathroom Home Equipment: Tub bench;Bedside commode;Walker - 4 wheels;Wheelchair - manual;Cane - single point;Walker - 2 wheels (ski pole)      Prior Function Level of  Independence: Independent         Comments: Uses walker anywhere when she is not in the house; will use in the house if she feels like her leg is starting to get weak     Hand Dominance   Dominant Hand: Right    Extremity/Trunk Assessment   Upper Extremity Assessment Upper Extremity Assessment: Overall WFL for tasks assessed    Lower Extremity Assessment Lower Extremity Assessment: Overall WFL for tasks assessed    Cervical / Trunk Assessment Cervical / Trunk Assessment: Kyphotic  Communication   Communication: No difficulties  Cognition Arousal/Alertness: Awake/alert Behavior During Therapy: WFL for tasks assessed/performed Overall Cognitive Status: Within Functional Limits for tasks assessed                                        General Comments      Exercises     Assessment/Plan    PT Assessment Patent does not need any further PT services  PT Problem List         PT Treatment Interventions      PT Goals (Current goals can be found in the Care Plan section)  Acute Rehab PT Goals Patient Stated Goal: to go home PT Goal Formulation: With patient Time For Goal Achievement: 05/27/17 Potential to Achieve Goals: Good    Frequency     Barriers to discharge        Co-evaluation               AM-PAC PT "6 Clicks" Daily Activity  Outcome Measure Difficulty turning over in bed (including adjusting bedclothes, sheets and blankets)?: None Difficulty moving from lying on back to sitting on the side of the bed? : None Difficulty sitting down on and standing up from a chair with arms (e.g., wheelchair, bedside commode, etc,.)?: None Help needed moving to and from a bed to chair (including a wheelchair)?: None Help needed walking in hospital room?: A Little Help needed climbing 3-5 steps with a railing? : A Little 6 Click Score: 22    End of Session Equipment Utilized During Treatment: Gait belt Activity Tolerance: Patient tolerated  treatment well;No increased pain Patient left: in chair;with call bell/phone within reach;with nursing/sitter in room Nurse Communication: Mobility status (mobility sheet left in room) PT Visit Diagnosis: Muscle weakness (generalized) (M62.81)    Time: 3474-2595 PT Time Calculation (min) (ACUTE ONLY): 18 min   Charges:   PT Evaluation $PT Eval Low Complexity: 1 Procedure     PT G Codes:   PT G-Codes **NOT FOR INPATIENT CLASS** Functional Assessment Tool Used: AM-PAC 6 Clicks Basic Mobility;Clinical judgement Functional Limitation: Mobility: Walking and moving around Mobility: Walking and Moving Around Current Status (G3875): At least 20 percent  but less than 40 percent impaired, limited or restricted Mobility: Walking and Moving Around Goal Status [phone]): At least 20 percent but less than 40 percent impaired, limited or restricted Mobility: Walking and Moving Around Discharge Status [phone]): At least 20 percent but less than 40 percent impaired, limited or restricted     Geraldine Solar PT, DPT

## 2017-05-24 NOTE — Progress Notes (Signed)
Nutrition Brief Note  RD consulted as part of COPD Gold Procotol order set.   Wt Readings from Last 15 Encounters:  05/23/17 115 lb 1.3 oz (52.2 kg)  03/15/17 113 lb 6.4 oz (51.4 kg)  03/05/17 112 lb (50.8 kg)  01/13/17 110 lb 6.4 oz (50.1 kg)  12/01/16 108 lb 12.8 oz (49.4 kg)  10/21/16 103 lb (46.7 kg)  10/11/16 105 lb 1.6 oz (47.7 kg)  04/23/16 106 lb 6.4 oz (48.3 kg)  10/29/14 154 lb 12.2 oz (70.2 kg)  10/16/13 117 lb 3.2 oz (53.2 kg)  04/16/13 123 lb (55.8 kg)  11/06/12 119 lb 6.4 oz (54.2 kg)    Body mass index is 25.8 kg/m. Patient meets criteria for overweight based on current BMI.   Patient seen up in chair having just finished her lunch. 50-75% consumed.   She reports that PTA she was eating well, but admits to eating some high sodium items recently, which is what she believes triggered her symptoms. She says typically she follows the 2g sodium diet religiously. Throughout our conversation she recites the specific sodium content of specific portion sizes of many different foods. She seems to be extremely well versed in the diet. SHe says she had stopped the diet because on her last cardiology appointment "the doctor told me my heart was all better".   RD asked about fluids. She says she drinks 2x 16 oz bottles of water and 2 ensures/day, ~1.5 liters of fluid.   RD commented on her apparent weight gain from 105 this past winter to 116 now. She says she feels that this is a true gain, not fluid related. SHe said she had lost the weight due to symptoms of heart failure that culminated in her December hospital admission. Since that time, she "is liking food again" and has increase her PO intake. Unsure of true ubw as she states at 3 different times her UBW is 105, 120 and then 130 lbs.   She says the lunch she had just eaten was the most she has had to eat in a long time. She remarks the portion sizes are way to big for her.   RD will order Ensure BID to mirror home habits. RD  offered to take food requests/preferences, which she had a few. Overall, patient appears to be doing well nutritionally. No further nutrition interventions warranted at this time. If nutrition issues arise, please consult RD.   Burtis Junes RD, LDN, CNSC Clinical Nutrition Pager: [phone] 05/24/2017 1:02 PM

## 2017-05-24 NOTE — Evaluation (Signed)
Occupational Therapy Evaluation Patient Details Name: Hannah Mckee MRN: [id number] DOB: 1931/07/29 Today's Date: 05/24/2017    History of Present Illness Hannah Mckee is a 83 y.o. female with medical history significant of femur fracture; HTN; COPD (not on home O2); CHF; and PNA in 12/17 (admitted by me) presenting with SOB.  She does lots of cooking, baking, candy-making and she made candy all day Saturday. At bedtime, she had a funny feeling in her head and felt nauseated.  She checked her O2 sat and it was 80%, so she put on O2.  She couldn't stop shaking that night.  She has had "violent rigors" in the past and thought she was septic.  She went to Urgent Care in [facility] Sunday evening - diagnosed with kidney infection due to blood in urine (given Cipro 250 mg) but the doctor was reportedly more worried about her heart and lungs.  This AM, she was so out of breath she couldn't talk.  No fever.  Occasional nonproductive cough while using her nebulizer but otherwise none.  She is spitting up a bit of phlegm despite not coughing.  RLE edema, chronic in that leg she has broken when she eats too much salt - she went off her diet a few weeks ago.  Has been out to eat 4 meals in a week.   Clinical Impression   Pt received supine in bed, agreeable to OT evaluation. Pt demonstrates independence in B/ADL completion this am, supervision with functional mobility initially. Pt reports she uses furniture and walls for balance at home sometimes. Pt SpO2 remaining at 97-99% throughout session with both sitting and standing tasks. Pt educated on energy conservation strategies to implement at home.  Pt is at baseline with B/ADL completion, daughters are available to assist as needed.     Follow Up Recommendations  No OT follow up;Supervision - Intermittent    Equipment Recommendations  None recommended by OT       Precautions / Restrictions Precautions Precautions: None Restrictions Weight Bearing  Restrictions: No      Mobility Bed Mobility Overal bed mobility: Modified Independent                Transfers Overall transfer level: Modified independent                        ADL either performed or assessed with clinical judgement   ADL Overall ADL's : Needs assistance/impaired Eating/Feeding: Modified independent;Sitting   Grooming: Wash/dry hands;Supervision/safety;Standing               Lower Body Dressing: Moderate assistance;Sitting/lateral leans Lower Body Dressing Details (indicate cue type and reason): Pt requiring assistance for donning socks due to level of bed. Reports she is able to complete at home in her chair.  Toilet Transfer: Supervision/safety;Ambulation;Regular Toilet   Toileting- Clothing Manipulation and Hygiene: Modified independent;Sit to/from stand       Functional mobility during ADLs: Supervision/safety                    Pertinent Vitals/Pain Pain Assessment: No/denies pain     Hand Dominance Right   Extremity/Trunk Assessment Upper Extremity Assessment Upper Extremity Assessment: Overall WFL for tasks assessed   Lower Extremity Assessment Lower Extremity Assessment: Defer to PT evaluation   Cervical / Trunk Assessment Cervical / Trunk Assessment: Kyphotic   Communication Communication Communication: No difficulties   Cognition Arousal/Alertness: Awake/alert Behavior During Therapy: WFL for tasks assessed/performed Overall Cognitive  Status: Within Functional Limits for tasks assessed                                                Home Living Family/patient expects to be discharged to:: Private residence Living Arrangements: Alone Available Help at Discharge: Family (2 daughters check in daily) Type of Home: Apartment Home Access: Elevator     Home Layout: One level     Bathroom Shower/Tub: Teacher, early years/pre: Handicapped height     Home Equipment: Tub  bench;Bedside commode;Walker - 4 wheels;Wheelchair - [employer] - single point (ski pole)          Prior Functioning/Environment Level of Independence: Independent        Comments: Uses walker in the community        OT Problem List: Decreased activity tolerance    AM-PAC PT "6 Clicks" Daily Activity     Outcome Measure Help from another person eating meals?: None Help from another person taking care of personal grooming?: None Help from another person toileting, which includes using toliet, bedpan, or urinal?: None Help from another person bathing (including washing, rinsing, drying)?: A Little Help from another person to put on and taking off regular upper body clothing?: None Help from another person to put on and taking off regular lower body clothing?: A Little 6 Click Score: 22   End of Session Equipment Utilized During Treatment: Gait belt  Activity Tolerance: Patient tolerated treatment well Patient left: in chair;with call bell/phone within reach  OT Visit Diagnosis: Muscle weakness (generalized) (M62.81)                Time: 0830-0900 OT Time Calculation (min): 30 min Charges:  OT General Charges $OT Visit: 1 Procedure OT Evaluation $OT Eval Low Complexity: 1 Procedure    Guadelupe Sabin, OTR/L  [phone] 05/24/2017, 10:42 AM

## 2017-05-24 NOTE — Progress Notes (Deleted)
[name] discharged to [facility] per MD order.  Discharge instructions reviewed and discussed with the patient, all questions and concerns answered. Copy of instructions and scripts sent with patient. Report called to [place], Therapist, sports at [facility]. Pt transported via [facility].  [name] Majestic Brister 05/24/2017 5:30 PM

## 2017-05-24 NOTE — Progress Notes (Signed)
Subjective: She was admitted with acute on chronic hypoxic respiratory failure with combination of COPD exacerbation and congestive heart failure exacerbation. She says she feels better. She's coughing some. Some wheezing  Objective: Vital signs in last 24 hours: Temp:  [97.5 F (36.4 C)-98.6 F (37 C)] 97.7 F (36.5 C) (07/24 0500) Pulse Rate:  [81-93] 81 (07/24 0500) Resp:  [16-27] 18 (07/24 0500) BP: (118-144)/(42-65) 121/48 (07/24 0500) SpO2:  [95 %-97 %] 96 % (07/24 0746) Weight:  [52.2 kg (115 lb 1.3 oz)-53.5 kg (118 lb)] 52.2 kg (115 lb 1.3 oz) (07/23 2034) Weight change:  Last BM Date: 05/22/17  Intake/Output from previous day: 07/23 0701 - 07/24 0700 In: -  Out: 800 [Urine:800]  PHYSICAL EXAM General appearance: alert, cooperative and mild distress Resp: wheezes bilaterally Cardio: regular rate and rhythm, S1, S2 normal, no murmur, click, rub or gallop GI: soft, non-tender; bowel sounds normal; no masses,  no organomegaly Extremities: Still some edema right lower leg Skin warm and dry  Lab Results:  Results for orders placed or performed during the hospital encounter of 05/23/17 (from the past 48 hour(s))  Brain natriuretic peptide     Status: Abnormal   Collection Time: 05/23/17 11:38 AM  Result Value Ref Range   B Natriuretic Peptide 200.0 (H) 0.0 - 100.0 pg/mL  CBC with Differential/Platelet     Status: Abnormal   Collection Time: 05/23/17 11:51 AM  Result Value Ref Range   WBC 11.2 (H) 4.0 - 10.5 K/uL   RBC 4.51 3.87 - 5.11 MIL/uL   Hemoglobin 13.0 12.0 - 15.0 g/dL   HCT 40.6 36.0 - 46.0 %   MCV 90.0 78.0 - 100.0 fL   MCH 28.8 26.0 - 34.0 pg   MCHC 32.0 30.0 - 36.0 g/dL   RDW 13.8 11.5 - 15.5 %   Platelets 252 150 - 400 K/uL   Neutrophils Relative % 77 %   Neutro Abs 8.6 (H) 1.7 - 7.7 K/uL   Lymphocytes Relative 10 %   Lymphs Abs 1.1 0.7 - 4.0 K/uL   Monocytes Relative 10 %   Monocytes Absolute 1.1 (H) 0.1 - 1.0 K/uL   Eosinophils Relative 3 %    Eosinophils Absolute 0.3 0.0 - 0.7 K/uL   Basophils Relative 0 %   Basophils Absolute 0.0 0.0 - 0.1 K/uL  Comprehensive metabolic panel     Status: Abnormal   Collection Time: 05/23/17 11:51 AM  Result Value Ref Range   Sodium 139 135 - 145 mmol/L   Potassium 3.7 3.5 - 5.1 mmol/L   Chloride 100 (L) 101 - 111 mmol/L   CO2 29 22 - 32 mmol/L   Glucose, Bld 105 (H) 65 - 99 mg/dL   BUN 17 6 - 20 mg/dL   Creatinine, Ser 0.74 0.44 - 1.00 mg/dL   Calcium 9.4 8.9 - 10.3 mg/dL   Total Protein 7.1 6.5 - 8.1 g/dL   Albumin 3.5 3.5 - 5.0 g/dL   AST 40 15 - 41 U/L   ALT 33 14 - 54 U/L   Alkaline Phosphatase 108 38 - 126 U/L   Total Bilirubin 0.5 0.3 - 1.2 mg/dL   GFR calc non Af Amer >60 >60 mL/min   GFR calc Af Amer >60 >60 mL/min    Comment: (NOTE) The eGFR has been calculated using the CKD EPI equation. This calculation has not been validated in all clinical situations. eGFR's persistently <60 mL/min signify possible Chronic Kidney Disease.    Anion gap 10  5 - 15  D-dimer, quantitative (not at [facility])     Status: Abnormal   Collection Time: 05/23/17 11:51 AM  Result Value Ref Range   D-Dimer, Quant 1.12 (H) 0.00 - 0.50 ug/mL-FEU    Comment: (NOTE) At the manufacturer cut-off of 0.50 ug/mL FEU, this assay has been documented to exclude PE with a sensitivity and negative predictive value of 97 to 99%.  At this time, this assay has not been approved by the FDA to exclude DVT/VTE. Results should be correlated with clinical presentation.   Troponin I     Status: None   Collection Time: 05/23/17 11:51 AM  Result Value Ref Range   Troponin I <0.03 <0.03 ng/mL  Urinalysis, Routine w reflex microscopic     Status: Abnormal   Collection Time: 05/23/17 11:51 AM  Result Value Ref Range   Color, Urine STRAW (A) YELLOW   APPearance CLEAR CLEAR   Specific Gravity, Urine 1.005 1.005 - 1.030   pH 7.0 5.0 - 8.0   Glucose, UA NEGATIVE NEGATIVE mg/dL   Hgb urine dipstick SMALL (A) NEGATIVE    Bilirubin Urine NEGATIVE NEGATIVE   Ketones, ur NEGATIVE NEGATIVE mg/dL   Protein, ur NEGATIVE NEGATIVE mg/dL   Nitrite NEGATIVE NEGATIVE   Leukocytes, UA NEGATIVE NEGATIVE   RBC / HPF 0-5 0 - 5 RBC/hpf   WBC, UA 0-5 0 - 5 WBC/hpf   Bacteria, UA NONE SEEN NONE SEEN   Squamous Epithelial / LPF 0-5 (A) NONE SEEN  Basic metabolic panel     Status: Abnormal   Collection Time: 05/24/17  6:54 AM  Result Value Ref Range   Sodium 138 135 - 145 mmol/L   Potassium 3.6 3.5 - 5.1 mmol/L   Chloride 97 (L) 101 - 111 mmol/L   CO2 29 22 - 32 mmol/L   Glucose, Bld 169 (H) 65 - 99 mg/dL   BUN 19 6 - 20 mg/dL   Creatinine, Ser 0.79 0.44 - 1.00 mg/dL   Calcium 9.4 8.9 - 10.3 mg/dL   GFR calc non Af Amer >60 >60 mL/min   GFR calc Af Amer >60 >60 mL/min    Comment: (NOTE) The eGFR has been calculated using the CKD EPI equation. This calculation has not been validated in all clinical situations. eGFR's persistently <60 mL/min signify possible Chronic Kidney Disease.    Anion gap 12 5 - 15  CBC with Differential/Platelet     Status: None   Collection Time: 05/24/17  6:54 AM  Result Value Ref Range   WBC 7.4 4.0 - 10.5 K/uL   RBC 4.52 3.87 - 5.11 MIL/uL   Hemoglobin 13.0 12.0 - 15.0 g/dL   HCT 39.9 36.0 - 46.0 %   MCV 88.3 78.0 - 100.0 fL   MCH 28.8 26.0 - 34.0 pg   MCHC 32.6 30.0 - 36.0 g/dL   RDW 13.7 11.5 - 15.5 %   Platelets 257 150 - 400 K/uL   Neutrophils Relative % 89 %   Neutro Abs 6.6 1.7 - 7.7 K/uL   Lymphocytes Relative 10 %   Lymphs Abs 0.7 0.7 - 4.0 K/uL   Monocytes Relative 1 %   Monocytes Absolute 0.1 0.1 - 1.0 K/uL   Eosinophils Relative 0 %   Eosinophils Absolute 0.0 0.0 - 0.7 K/uL   Basophils Relative 0 %   Basophils Absolute 0.0 0.0 - 0.1 K/uL    ABGS No results for input(s): PHART, PO2ART, TCO2, HCO3 in the last 72 hours.  Invalid  input(s): PCO2 CULTURES No results found for this or any previous visit (from the past 240 hour(s)). Studies/Results: Dg Chest 2  View  Result Date: 05/23/2017 CLINICAL DATA:  Former smoker with current history of COPD and CHF presenting with three-day history of cough and shortness of breath. EXAM: CHEST  2 VIEW COMPARISON:  10/07/2016, 10/04/2016 and earlier. FINDINGS: The patient's chin overlies the medial lung apices. Cardiac silhouette upper normal in size to slightly enlarged, unchanged. Thoracic aorta tortuous and markedly atherosclerotic, unchanged. Hilar and mediastinal contours otherwise unremarkable. Emphysematous changes throughout both lungs, unchanged. Blunting of both costophrenic angles, best seen on lateral image. Linear scarring in the lower lobes, unchanged. Lungs otherwise clear. Pulmonary vascularity normal. Exaggeration of the usual thoracic kyphosis with generalized osseous demineralization as noted previously. IMPRESSION: 1. COPD/emphysema. Borderline to mild cardiomegaly. No acute cardiopulmonary disease. 2. Chronic pleuroparenchymal scarring at the bases versus small chronic bilateral pleural effusions. Scarring is favored. 3. Thoracic aortic atherosclerosis. Aortic Atherosclerosis (ICD10-170.0) Emphysema. ([id number]-Y70.9) Electronically Signed   By: Evangeline Dakin M.D.   On: 05/23/2017 12:14   Ct Angio Chest Pe W And/or Wo Contrast  Result Date: 05/23/2017 CLINICAL DATA:  Cough and shortness of breath for several days EXAM: CT ANGIOGRAPHY CHEST WITH CONTRAST TECHNIQUE: Multidetector CT imaging of the chest was performed using the standard protocol during bolus administration of intravenous contrast. Multiplanar CT image reconstructions and MIPs were obtained to evaluate the vascular anatomy. CONTRAST:  100 mL Isovue 370. COMPARISON:  Plain film from earlier in the same day. FINDINGS: Cardiovascular: Diffuse atherosclerotic calcifications of the thoracic aorta are noted. The origins of the great vessels are patent but also shows some mild calcifications. No aneurysmal dilatation or dissection is seen. No  significant cardiac enlargement is noted. Diffuse coronary calcifications are noted. The pulmonary artery shows a normal branching pattern without evidence of definitive pulmonary embolism. Mediastinum/Nodes: Thoracic inlet demonstrates some nodular changes within the right lobe of the thyroid. Scattered calcifications are noted as well. No significant hilar or mediastinal adenopathy is identified. Scattered calcified hilar lymph nodes are noted particularly on the right consistent with prior granulomatous disease. The esophagus is within normal limits. Lungs/Pleura: Lungs are well aerated bilaterally. Some minimal scarring is noted in the right lower lobe. No focal infiltrate or sizable effusion is seen. Upper Abdomen: Right renal cyst is noted. Changes of prior cholecystectomy are seen. Musculoskeletal: Severe kyphosis of the thoracic spine is noted. No definitive compression deformity is seen. Mild osteophytic changes are noted. Review of the MIP images confirms the above findings. IMPRESSION: No evidence of pulmonary emboli. Chronic changes as described above. Aortic Atherosclerosis (ICD10-I70.0). Electronically Signed   By: Inez Catalina M.D.   On: 05/23/2017 16:04   US Venous Img Lower Unilateral Right  Result Date: 05/23/2017 CLINICAL DATA:  Initial evaluation for acute right lower extremity edema with swelling for 1 week. EXAM: Right LOWER EXTREMITY VENOUS DOPPLER ULTRASOUND TECHNIQUE: Gray-scale sonography with graded compression, as well as color Doppler and duplex ultrasound were performed to evaluate the lower extremity deep venous systems from the level of the common femoral vein and including the common femoral, femoral, profunda femoral, popliteal and calf veins including the posterior tibial, peroneal and gastrocnemius veins when visible. The superficial great saphenous vein was also interrogated. Spectral Doppler was utilized to evaluate flow at rest and with distal augmentation maneuvers in the  common femoral, femoral and popliteal veins. COMPARISON:  None. FINDINGS: Contralateral Common Femoral Vein: Respiratory phasicity is normal and symmetric with  the symptomatic side. No evidence of thrombus. Normal compressibility. Common Femoral Vein: No evidence of thrombus. Normal compressibility, respiratory phasicity and response to augmentation. Saphenofemoral Junction: No evidence of thrombus. Normal compressibility and flow on color Doppler imaging. Profunda Femoral Vein: No evidence of thrombus. Normal compressibility and flow on color Doppler imaging. Femoral Vein: No evidence of thrombus. Normal compressibility, respiratory phasicity and response to augmentation. Popliteal Vein: No evidence of thrombus. Normal compressibility, respiratory phasicity and response to augmentation. Calf Veins: No evidence of thrombus. Normal compressibility and flow on color Doppler imaging. Superficial Great Saphenous Vein: No evidence of thrombus. Normal compressibility and flow on color Doppler imaging. Venous Reflux:  None. Other Findings:  None. IMPRESSION: No evidence of DVT within the right lower extremity. Electronically Signed   By: Jeannine Boga M.D.   On: 05/23/2017 17:56    Medications:  Prior to Admission:  Prescriptions Prior to Admission  Medication Sig Dispense Refill Last Dose  . ANORO ELLIPTA 62.5-25 MCG/INH AEPB Inhale 1 puff into the lungs daily.   05/22/2017 at Unknown time  . aspirin EC 81 MG tablet Take 81 mg by mouth daily.   05/23/2017 at Unknown time  . ciprofloxacin (CIPRO) 250 MG tablet Take 250 mg by mouth 2 (two) times daily.   05/23/2017 at Unknown time  . clonazePAM (KLONOPIN) 0.5 MG tablet Take 0.125 mg by mouth daily as needed for anxiety.    unknown  . feeding supplement, ENSURE ENLIVE, (ENSURE ENLIVE) LIQD Take 237 mLs by mouth 2 (two) times daily between meals. (Patient taking differently: Take 237 mLs by mouth daily. ) 237 mL 12 03/04/2017 at Unknown time  . furosemide  (LASIX) 40 MG tablet Take 0.5 tablets (20 mg total) by mouth daily. 30 tablet 3 05/23/2017 at Unknown time  . lisinopril (PRINIVIL,ZESTRIL) 2.5 MG tablet TAKE 1 TABLET BY MOUTH EVERY DAY 90 tablet 3 05/23/2017 at Unknown time  . metoprolol succinate (TOPROL-XL) 50 MG 24 hr tablet TAKE 1 AND 1/2 TABLETS DAILY 135 tablet 1 05/23/2017 at 0830  . potassium chloride SA (K-DUR,KLOR-CON) 20 MEQ tablet Take 1 tablet (20 mEq total) by mouth 3 (three) times a week. Takes only when Furosemide is taken (Patient taking differently: Take 20 mEq by mouth daily. Takes only when Furosemide is taken) 30 tablet 3 05/23/2017 at Unknown time   Scheduled: . aspirin EC  81 mg Oral Daily  . ciprofloxacin  250 mg Oral BID  . enoxaparin (LOVENOX) injection  40 mg Subcutaneous Q24H  . feeding supplement (ENSURE ENLIVE)  237 mL Oral BID BM  . ipratropium-albuterol  3 mL Nebulization Q6H  . lisinopril  2.5 mg Oral Daily  . methylPREDNISolone (SOLU-MEDROL) injection  60 mg Intravenous Q12H  . metoprolol succinate  75 mg Oral Daily  . potassium chloride SA  20 mEq Oral Daily  . sodium chloride flush  3 mL Intravenous Q12H   Continuous: . sodium chloride     HQP:RFFMBW chloride, acetaminophen, albuterol, clonazePAM, ondansetron (ZOFRAN) IV, sodium chloride flush  Assesment: She was admitted with acute on chronic hypoxic respiratory failure. She is better. She has COPD exacerbation. She has congestive heart failure exacerbation. Principal Problem:   Acute on chronic respiratory failure with hypoxia (HCC) Active Problems:   Hypertension   COPD exacerbation (H[facility])   Chronic diastolic CHF (congestive heart failure) ([facility])    Plan: Continue current treatments. I think she may be able to be discharged tomorrow.    LOS: 1 day   Orell Hurtado L 05/24/2017,  9:03 AM

## 2017-05-25 ENCOUNTER — Encounter (HOSPITAL_COMMUNITY): Payer: Self-pay | Admitting: Student

## 2017-05-25 DIAGNOSIS — I5033 Acute on chronic diastolic (congestive) heart failure: Secondary | ICD-10-CM

## 2017-05-25 DIAGNOSIS — I471 Supraventricular tachycardia: Secondary | ICD-10-CM

## 2017-05-25 LAB — BASIC METABOLIC PANEL
ANION GAP: 10 (ref 5–15)
BUN: 32 mg/dL — ABNORMAL HIGH (ref 6–20)
CHLORIDE: 99 mmol/L — AB (ref 101–111)
CO2: 28 mmol/L (ref 22–32)
Calcium: 9.4 mg/dL (ref 8.9–10.3)
Creatinine, Ser: 0.77 mg/dL (ref 0.44–1.00)
GFR calc non Af Amer: >60 mL/min (ref >60)
Glucose, Bld: 151 mg/dL — ABNORMAL HIGH (ref 65–99)
POTASSIUM: 4.2 mmol/L (ref 3.5–5.1)
SODIUM: 137 mmol/L (ref 135–145)

## 2017-05-25 LAB — MAGNESIUM: MAGNESIUM: 2.2 mg/dL (ref 1.7–2.4)

## 2017-05-25 LAB — TSH: TSH: 0.112 u[IU]/mL — AB (ref 0.350–4.500)

## 2017-05-25 MED ORDER — FUROSEMIDE 10 MG/ML IJ SOLN
20.0000 mg | Freq: Once | INTRAMUSCULAR | Status: AC
  Administered 2017-05-25: 20 mg via INTRAVENOUS
  Filled 2017-05-25: qty 2

## 2017-05-25 MED ORDER — CLONAZEPAM 0.5 MG PO TABS
0.2500 mg | ORAL_TABLET | Freq: Three times a day (TID) | ORAL | Status: DC | PRN
  Administered 2017-05-25: 0.25 mg via ORAL
  Filled 2017-05-25: qty 1

## 2017-05-25 MED ORDER — FUROSEMIDE 10 MG/ML IJ SOLN: 20.0000 mg | Freq: Two times a day (BID) | INTRAMUSCULAR | Status: DC

## 2017-05-25 NOTE — Consult Note (Signed)
Cardiology Consult    Patient ID: Hannah Mckee MRN: [id number], DOB/AGE: [birth year]   Admit date: 05/23/2017 Date of Consult: 05/25/2017  Primary Physician: Sinda Du, MD Reason for Consult: Arrhythmia Primary Cardiologist: Dr. Harl Bowie Requesting Provider: Dr. Luan Pulling  History of Present Illness    Hannah Mckee is a 83 y.o. female with past medical history of chronic systolic CHF (EF 69-48% by echo in 10/2016, improved to 55-60% by echo in 04/2017), COPD (on nocturnal O2), and HTN who is being seen today for the evaluation of an arrhythmia at the request of Dr. Luan Pulling.   The patient was last evaluated by Dr. Harl Bowie in 03/2017 and denied any recent chest pain or dyspnea on exertion. Weights were stable between 112-114 lbs on her home scales. The family had previously deferred ischemic testing in the setting of her reduced EF, therefore a repeat echocardiogram was recommended to reassess EF. This showed her EF had improved to 55-60% with Grade 1 DD,  Mild AI, and mild MR.   She presented to [facility] ED on 05/23/2017 for worsening dyspnea after recently being diagnosed with a UTI and starting on antibiotics. The patient felt nauseated and dizzy the day of her presentation to the ED, checking her O2 saturations at home and reporting they were in the 80's.   Initial labs showed WBC of 11.2, Hgb 13.0, platelets 252. Na+ 139, K+ 3.7, creatinine 0.74. D-dimer 1.12. BNP 200. Initial troponin negative. CXR with COPD and borderline cardiomegaly with no acute cardiopulmonary abnormalities. CTA with no evidence of a PE. Initial EKG showed sinus rhythm, HR 83, with PAC's and PVC's.    She was admitted and started on treatment for an acute COPD exacerbation including IV Solu-medrol and being continued on her PTA Cipro for recently diagnosed UTI. She has received gentle diuresis with IV Lasix (40mg  on admission then 20mg  for one dose on 7/24). I&O's have not been recorded but weight has  trended down two pounds (118 --> 116 lbs).   She reports overall doing well until last Saturday. Breathing acutely worsened at rest and with exertion. Denies any recent chest pain or palpitations. Does report noting her HR skips when checking it on her home monitor. Weights had been stable on her home scales between 115 - 116 lbs. Weighs daily and submits her numbers electronically through her insurance company. No recent orthopnea or PND. She had consumed sausage biscuits for breakfast the few days leading up to her presentation but says she usually limits her sodium intake. 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     Past Medical History   Past Medical History:  Diagnosis Date  . CHF (congestive heart failure) ([place])    a. EF 35-40% by echo in 10/2016 b. improved to 55-60% by echo in 04/2017  . COPD (chronic obstructive pulmonary disease) ([facility])    no home O2  . Hypertension   . Kidney stones   . Osteoarthritis   . Salivary gland stone   . Sepsis [facility])     Past Surgical History:  Procedure Laterality Date  . bilateral cataract surg    . BUNIONECTOMY    . CHOLECYSTECTOMY    . COLONOSCOPY  11/16/2012   Procedure: COLONOSCOPY;  Surgeon: Rogene Houston, MD;  Location: [facility];  Service: Endoscopy;  Laterality: N/A;  100  . LITHOTRIPSY    . ORIF PERIPROSTHETIC FRACTURE Right 10/29/2014   Procedure: OPEN  REDUCTION INTERNAL FIXATION (ORIF) PERIPROSTHETIC FEMUR FRACTURE/FEMUR SHAFT;  Surgeon: Mcarthur Rossetti, MD;  Location: [place];  Service: Orthopedics;  Laterality: Right;  . TONSILLECTOMY    . TOTAL HIP ARTHROPLASTY     1989     Allergies  Allergies  Allergen Reactions  . Erythromycin Swelling  . Levaquin [Levofloxacin] Other (See Comments)    Tongue turns red and sore  . Penicillins Swelling    Has patient had a PCN reaction causing immediate rash, facial/tongue/throat swelling, SOB or lightheadedness with hypotension:  Yes Has patient had a PCN reaction causing severe rash involving mucus membranes or skin necrosis: No Has patient had a PCN reaction that required hospitalization No Has patient had a PCN reaction occurring within the last 10 years: No If all of the above answers are "NO", then may proceed with Cephalosporin use.   . Sulfa Antibiotics Other (See Comments)    Tongue turns red and sore    Inpatient Medications    . aspirin EC  81 mg Oral Daily  . ciprofloxacin  250 mg Oral BID  . enoxaparin (LOVENOX) injection  40 mg Subcutaneous Q24H  . feeding supplement (ENSURE ENLIVE)  237 mL Oral BID BM  . furosemide  20 mg Intravenous Once  . lisinopril  2.5 mg Oral Daily  . methylPREDNISolone (SOLU-MEDROL) injection  60 mg Intravenous Q12H  . metoprolol succinate  75 mg Oral Daily  . potassium chloride SA  20 mEq Oral Daily  . sodium chloride flush  3 mL Intravenous Q12H    Family History    Family History  Problem Relation Age of Onset  . Hypertension Sister     Social History    Social History   Social History  . Marital status: Divorced    Spouse name: N/A  . Number of children: N/A  . Years of education: N/A   Occupational History  . Not on file.   Social History Main Topics  . Smoking status: Former Smoker    Packs/day: 0.50    Years: 35.00    Types: Cigarettes    Quit date: 01/1997  . Smokeless tobacco: Never Used  . Alcohol use No  . Drug use: No  . Sexual activity: Not on file   Other Topics Concern  . Not on file   Social History Narrative  . No narrative on file     Review of Systems    General:  No chills, fever, night sweats or weight changes.  Cardiovascular:  No chest pain, edema, orthopnea, palpitations, paroxysmal nocturnal dyspnea. Positive for dyspnea on exertion.  Dermatological: No rash, lesions/masses Respiratory: No cough, Positive for dyspnea Urologic: No hematuria, dysuria Abdominal:   No nausea, vomiting, diarrhea, bright red blood per  rectum, melena, or hematemesis Neurologic:  No visual changes, wkns, changes in mental status.  All other systems reviewed and are otherwise negative except as noted above.  Physical Exam    Blood pressure 97/83, pulse (!) 56, temperature 98.1 F (36.7 C), temperature source Oral, resp. rate 18, height 4\' 8"  (1.422 m), weight 116 lb 6.5 oz (52.8 kg), SpO2 98 %.  General: Pleasant elderly Caucasian female appearing in NAD Psych: Normal affect. Neuro: Alert and oriented X 3. Moves all extremities spontaneously. HEENT: Normal  Neck: Supple without bruits or JVD. Lungs:  Resp regular and unlabored, CTA without wheezing or rales. Heart: RRR with occasional ectopic beats, no s3, s4, or murmurs. Abdomen: Soft, non-tender, non-distended, BS + x 4.  Extremities: No  clubbing, cyanosis or lower extremity edema. DP/PT/Radials 2+ and equal bilaterally.  Labs    Troponin (Point of Care Test) No results for input(s): TROPIPOC in the last 72 hours.  Recent Labs  05/23/17 1151  TROPONINI <0.03   Lab Results  Component Value Date   WBC 7.4 05/24/2017   HGB 13.0 05/24/2017   HCT 39.9 05/24/2017   MCV 88.3 05/24/2017   PLT 257 05/24/2017    Recent Labs Lab 05/23/17 1151  05/25/17 0633  NA 139  < > 137  K 3.7  < > 4.2  CL 100*  < > 99*  CO2 29  < > 28  BUN 17  < > 32*  CREATININE 0.74  < > 0.77  CALCIUM 9.4  < > 9.4  PROT 7.1  --   --   BILITOT 0.5  --   --   ALKPHOS 108  --   --   ALT 33  --   --   AST 40  --   --   GLUCOSE 105*  < > 151*  < > = values in this interval not displayed. No results found for: CHOL, HDL, LDLCALC, TRIG Lab Results  Component Value Date   DDIMER 1.12 (H) 05/23/2017     Radiology Studies    Dg Chest 2 View  Result Date: 05/23/2017 CLINICAL DATA:  Former smoker with current history of COPD and CHF presenting with three-day history of cough and shortness of breath. EXAM: CHEST  2 VIEW COMPARISON:  10/07/2016, 10/04/2016 and earlier. FINDINGS: The  patient's chin overlies the medial lung apices. Cardiac silhouette upper normal in size to slightly enlarged, unchanged. Thoracic aorta tortuous and markedly atherosclerotic, unchanged. Hilar and mediastinal contours otherwise unremarkable. Emphysematous changes throughout both lungs, unchanged. Blunting of both costophrenic angles, best seen on lateral image. Linear scarring in the lower lobes, unchanged. Lungs otherwise clear. Pulmonary vascularity normal. Exaggeration of the usual thoracic kyphosis with generalized osseous demineralization as noted previously. IMPRESSION: 1. COPD/emphysema. Borderline to mild cardiomegaly. No acute cardiopulmonary disease. 2. Chronic pleuroparenchymal scarring at the bases versus small chronic bilateral pleural effusions. Scarring is favored. 3. Thoracic aortic atherosclerosis. Aortic Atherosclerosis (ICD10-170.0) Emphysema. (XHB71-I96.9) Electronically Signed   By: Evangeline Dakin M.D.   On: 05/23/2017 12:14   Ct Angio Chest Pe W And/or Wo Contrast  Result Date: 05/23/2017 CLINICAL DATA:  Cough and shortness of breath for several days EXAM: CT ANGIOGRAPHY CHEST WITH CONTRAST TECHNIQUE: Multidetector CT imaging of the chest was performed using the standard protocol during bolus administration of intravenous contrast. Multiplanar CT image reconstructions and MIPs were obtained to evaluate the vascular anatomy. CONTRAST:  100 mL Isovue 370. COMPARISON:  Plain film from earlier in the same day. FINDINGS: Cardiovascular: Diffuse atherosclerotic calcifications of the thoracic aorta are noted. The origins of the great vessels are patent but also shows some mild calcifications. No aneurysmal dilatation or dissection is seen. No significant cardiac enlargement is noted. Diffuse coronary calcifications are noted. The pulmonary artery shows a normal branching pattern without evidence of definitive pulmonary embolism. Mediastinum/Nodes: Thoracic inlet demonstrates some nodular changes  within the right lobe of the thyroid. Scattered calcifications are noted as well. No significant hilar or mediastinal adenopathy is identified. Scattered calcified hilar lymph nodes are noted particularly on the right consistent with prior granulomatous disease. The esophagus is within normal limits. Lungs/Pleura: Lungs are well aerated bilaterally. Some minimal scarring is noted in the right lower lobe. No focal infiltrate or sizable effusion is  seen. Upper Abdomen: Right renal cyst is noted. Changes of prior cholecystectomy are seen. Musculoskeletal: Severe kyphosis of the thoracic spine is noted. No definitive compression deformity is seen. Mild osteophytic changes are noted. Review of the MIP images confirms the above findings. IMPRESSION: No evidence of pulmonary emboli. Chronic changes as described above. Aortic Atherosclerosis (ICD10-I70.0). Electronically Signed   By: Inez Catalina M.D.   On: 05/23/2017 16:04   US Venous Img Lower Unilateral Right  Result Date: 05/23/2017 CLINICAL DATA:  Initial evaluation for acute right lower extremity edema with swelling for 1 week. EXAM: Right LOWER EXTREMITY VENOUS DOPPLER ULTRASOUND TECHNIQUE: Gray-scale sonography with graded compression, as well as color Doppler and duplex ultrasound were performed to evaluate the lower extremity deep venous systems from the level of the common femoral vein and including the common femoral, femoral, profunda femoral, popliteal and calf veins including the posterior tibial, peroneal and gastrocnemius veins when visible. The superficial great saphenous vein was also interrogated. Spectral Doppler was utilized to evaluate flow at rest and with distal augmentation maneuvers in the common femoral, femoral and popliteal veins. COMPARISON:  None. FINDINGS: Contralateral Common Femoral Vein: Respiratory phasicity is normal and symmetric with the symptomatic side. No evidence of thrombus. Normal compressibility. Common Femoral Vein: No  evidence of thrombus. Normal compressibility, respiratory phasicity and response to augmentation. Saphenofemoral Junction: No evidence of thrombus. Normal compressibility and flow on color Doppler imaging. Profunda Femoral Vein: No evidence of thrombus. Normal compressibility and flow on color Doppler imaging. Femoral Vein: No evidence of thrombus. Normal compressibility, respiratory phasicity and response to augmentation. Popliteal Vein: No evidence of thrombus. Normal compressibility, respiratory phasicity and response to augmentation. Calf Veins: No evidence of thrombus. Normal compressibility and flow on color Doppler imaging. Superficial Great Saphenous Vein: No evidence of thrombus. Normal compressibility and flow on color Doppler imaging. Venous Reflux:  None. Other Findings:  None. IMPRESSION: No evidence of DVT within the right lower extremity. Electronically Signed   By: Jeannine Boga M.D.   On: 05/23/2017 17:56    EKG & Cardiac Imaging    EKG:  Sinus rhythm, HR 83, with PAC's and PVC's.  - Personally Reviewed  Echocardiogram: 04/07/2017 Study Conclusions  - Left ventricle: The cavity size was normal. Wall thickness was   normal. Systolic function was normal. The estimated ejection   fraction was in the range of 55% to 60%. Doppler parameters are   consistent with abnormal left ventricular relaxation (grade 1   diastolic dysfunction). Doppler parameters are consistent with   high ventricular filling pressure. - Aortic valve: Mildly calcified annulus. Trileaflet; normal   thickness leaflets. There was mild regurgitation. Valve area   (VTI): 1.98 cm^2. Valve area (Vmax): 2.1 cm^2. Valve area   (Vmean): 2.03 cm^2. - Mitral valve: Mildly calcified annulus. Normal thickness leaflets   . There was mild regurgitation. - Left atrium: The atrium was moderately dilated. - Right atrium: The atrium was mildly dilated. - Pulmonary arteries: Systolic pressure was mildly increased. PA    peak pressure: 38 mm Hg (S). - Technically adequate study.  Assessment & Plan    1. Cardiac Arrhythmia - currently admitted for acute hypoxic respiratory failure secondary to COPD exacerbation and diastolic dysfunction.  - she was noted to have episodes of tachycardia on telemetry throughout yesterday afternoon and overnight. Asymptomatic with this. Overall, her rhythm seems most consistent with sinus rhythm with frequent PAC's and PVC's as p-waves are clearly noted. There are occasional runs of atrial tachycardia. Overall,  she is asymptomatic with this. No clear atrial fibrillation/flutter is identified. EKG at that time also showed NSR with frequent ectopy.  - she is currently on Toprol-XL 75mg  daily. Could consider increasing to 100mg  daily but she would need to follow BP closely as it has been variable this admission and baseline HR is in the 50's at times.  - discussed with the admitting team. She has been switched from Albuterol to Xopenex to avoid rebound tachycardia. In review of labs, her TSH was low in 10/2016. Recommend this be followed by her PCP for repeat lab work as this could be playing a factor in her frequent ectopy as well.   2. Chronic Diastolic CHF - EF previously 35-40% by echo in 10/2016, improved to 55-60% by echo in 04/2017. - BNP slightly elevated to 200 on admission but CXR showed no evidence of this.  - has received intermittent doses of IV Lasix. Overall, volume status appears stable on examination - would recommend discharging on her PTA Lasix dosing of 20mg  daily.  3. HTN - BP variable at 97/46 - 141/100 in the past 24 hours. Reports SBP is usually in the 130's - 140's when checked at home.  - continue PTA Lisinopril 2.5mg  daily with potential adjustment of BB therapy as above.   4. COPD Exacerbation - recommend switching from Albuterol to Xopenex to avoid rebound tachycardia. - per admitting team.    Signed, Erma Heritage, PA-C 05/25/2017, 8:16  AM Pager: [phone]  Attending note Patient seen and discussed with PA Ahmed Prima, I agree with her documentation above. 83 yo female history of chronic systolic HF LVEF previously 35-40% but most recently has normalized at 55-60%, COPD on home O2, HTN admitted with SOB and hypoxia. She has been treated for both a COPD and and CHF exacerbation. Has had some episodes of tachycardia noted during admission, potentially related to heavy albuterol use and high dose steroids. Telemetry review shows SR with PACs, episodes of atach and MAT with elevated rates. No afib or aflutter.  Intermittent tachycardia in form of atach and MAT. There is no indication for anticoagulation. Arrhythmia likely exacerbated by her COPD exacerbation with increased tachcyardia drive combined with steroid and nebulizer use. This should improve as her COPD exacerbation improves and medication is weaned. Would not increase beta blocker at this time due to intermittent soft bp's. If persistent tachycardia however may have to consider dose increase. Would continue to monitor at this time. F/u TSH. Would restart he home oral lasix.   No further cardiology recs at this time, we will arrange f/u in 2 weeks. [place] for discharge from cardiac standpoint.    Carlyle Dolly MD

## 2017-05-25 NOTE — Care Management Note (Signed)
Case Management Note  Patient Details  Name: Hannah Mckee MRN: [id number] Date of Birth: Mar 20, 1931  Subjective/Objective:                  Pt admitted with CHF. She is from home, lives alone and has daughter who is very supportive. She is very active, she cooks her own meals and was very compliant with diet requirements until a couple weeks ago when she was told her heart was okay and she could eat what she wants. She realizes now that is not the case. She has home O2 she wars prn HS. She has neb machine, walker, BSC and pulse ox. She has scale and weighs herself daily. She has no HH pta. She has PCP, transportation to appointments and insurance with drug coverage. She plans to return home with self care. She communicates no needs.   Action/Plan: Pt discharging home today with self care.   Expected Discharge Date:  05/25/17               Expected Discharge Plan:  Home/Self Care  In-House Referral:  NA  Discharge planning Services  CM Consult  Post Acute Care Choice:  NA Choice offered to:  NA  Status of Service:  Completed, signed off  Sherald Barge, RN 05/25/2017, 1:49 PM

## 2017-05-25 NOTE — Progress Notes (Signed)
Hannah Mckee discharged Home per MD order.  Discharge instructions reviewed and discussed with the patient, all questions and concerns answered. Copy of instructions given to patient.  Allergies as of 05/25/2017      Reactions   Erythromycin Swelling   Levaquin [levofloxacin] Other (See Comments)   Tongue turns red and sore   Penicillins Swelling   Has patient had a PCN reaction causing immediate rash, facial/tongue/throat swelling, SOB or lightheadedness with hypotension: Yes Has patient had a PCN reaction causing severe rash involving mucus membranes or skin necrosis: No Has patient had a PCN reaction that required hospitalization No Has patient had a PCN reaction occurring within the last 10 years: No If all of the above answers are "NO", then may proceed with Cephalosporin use.   Sulfa Antibiotics Other (See Comments)   Tongue turns red and sore      Medication List    TAKE these medications   ANORO ELLIPTA 62.5-25 MCG/INH Aepb Generic drug:  umeclidinium-vilanterol Inhale 1 puff into the lungs daily.   aspirin EC 81 MG tablet Take 81 mg by mouth daily.   ciprofloxacin 250 MG tablet Commonly known as:  CIPRO Take 250 mg by mouth 2 (two) times daily.   clonazePAM 0.5 MG tablet Commonly known as:  KLONOPIN Take 0.125 mg by mouth daily as needed for anxiety.   feeding supplement (ENSURE ENLIVE) Liqd Take 237 mLs by mouth 2 (two) times daily between meals. What changed:  when to take this   furosemide 40 MG tablet Commonly known as:  LASIX Take 0.5 tablets (20 mg total) by mouth daily.   lisinopril 2.5 MG tablet Commonly known as:  PRINIVIL,ZESTRIL TAKE 1 TABLET BY MOUTH EVERY DAY   metoprolol succinate 50 MG 24 hr tablet Commonly known as:  TOPROL-XL TAKE 1 AND 1/2 TABLETS DAILY   potassium chloride SA 20 MEQ tablet Commonly known as:  K-DUR,KLOR-CON Take 1 tablet (20 mEq total) by mouth 3 (three) times a week. Takes only when Furosemide is taken What  changed:  when to take this  additional instructions       Patients skin is clean, dry and intact, no evidence of skin break down. IV site discontinued and catheter remains intact. Site without signs and symptoms of complications. Dressing and pressure applied.  Patient escorted to car by NT in a wheelchair,  no distress noted upon discharge.  Hannah Mckee Hannah Mckee 05/25/2017 2:38 PM

## 2017-05-25 NOTE — Care Management Important Message (Signed)
Important Message  Patient Details  Name: Hannah Mckee MRN: [id number] Date of Birth: 1931-05-27   Medicare Important Message Given:  Yes    Sherald Barge, RN 05/25/2017, 1:48 PM

## 2017-05-25 NOTE — Progress Notes (Signed)
Subjective: She says she feels much better and wants to go home. She has no new complaints. She had episodes of tachycardia yesterday that looked like PACs and atrial tach. There was concern that she had atrial fib but I don't see definite atrial fib. These were asymptomatic. I was concerned it might be related to nebulizer treatments so I have changed her from albuterol to Xopenex. She does not use her nebulizer at home and has not in the last 2 months. She's only used her rescue inhaler once in the last 2 months so I don't know that I need to change her home medications and she is using it so little. She says her breathing is good. She feels back to baseline.  Objective: Vital signs in last 24 hours: Temp:  [97.8 F (36.6 C)-98.1 F (36.7 C)] 98.1 F (36.7 C) (07/25 0500) Pulse Rate:  [56-89] 56 (07/25 0500) Resp:  [18-20] 18 (07/25 0500) BP: (97-141)/(46-100) 97/83 (07/25 0500) SpO2:  [95 %-99 %] 98 % (07/25 0500) Weight:  [52.8 kg (116 lb 6.5 oz)] 52.8 kg (116 lb 6.5 oz) (07/25 0500) Weight change: -0.724 kg (-1 lb 9.6 oz) Last BM Date: 05/24/17  Intake/Output from previous day: 07/24 0701 - 07/25 0700 In: 720 [P.O.:720] Out: 1 [Urine:1]  PHYSICAL EXAM General appearance: alert, cooperative and no distress Resp: clear to auscultation bilaterally Cardio: regular rate and rhythm, S1, S2 normal, no murmur, click, rub or gallop GI: soft, non-tender; bowel sounds normal; no masses,  no organomegaly Extremities: She still has a little bit of edema of her right leg Skin warm and dry  Lab Results:  Results for orders placed or performed during the hospital encounter of 05/23/17 (from the past 48 hour(s))  Brain natriuretic peptide     Status: Abnormal   Collection Time: 05/23/17 11:38 AM  Result Value Ref Range   B Natriuretic Peptide 200.0 (H) 0.0 - 100.0 pg/mL  CBC with Differential/Platelet     Status: Abnormal   Collection Time: 05/23/17 11:51 AM  Result Value Ref Range   WBC  11.2 (H) 4.0 - 10.5 K/uL   RBC 4.51 3.87 - 5.11 MIL/uL   Hemoglobin 13.0 12.0 - 15.0 g/dL   HCT 40.6 36.0 - 46.0 %   MCV 90.0 78.0 - 100.0 fL   MCH 28.8 26.0 - 34.0 pg   MCHC 32.0 30.0 - 36.0 g/dL   RDW 13.8 11.5 - 15.5 %   Platelets 252 150 - 400 K/uL   Neutrophils Relative % 77 %   Neutro Abs 8.6 (H) 1.7 - 7.7 K/uL   Lymphocytes Relative 10 %   Lymphs Abs 1.1 0.7 - 4.0 K/uL   Monocytes Relative 10 %   Monocytes Absolute 1.1 (H) 0.1 - 1.0 K/uL   Eosinophils Relative 3 %   Eosinophils Absolute 0.3 0.0 - 0.7 K/uL   Basophils Relative 0 %   Basophils Absolute 0.0 0.0 - 0.1 K/uL  Comprehensive metabolic panel     Status: Abnormal   Collection Time: 05/23/17 11:51 AM  Result Value Ref Range   Sodium 139 135 - 145 mmol/L   Potassium 3.7 3.5 - 5.1 mmol/L   Chloride 100 (L) 101 - 111 mmol/L   CO2 29 22 - 32 mmol/L   Glucose, Bld 105 (H) 65 - 99 mg/dL   BUN 17 6 - 20 mg/dL   Creatinine, Ser 0.74 0.44 - 1.00 mg/dL   Calcium 9.4 8.9 - 10.3 mg/dL   Total Protein 7.1 6.5 -  8.1 g/dL   Albumin 3.5 3.5 - 5.0 g/dL   AST 40 15 - 41 U/L   ALT 33 14 - 54 U/L   Alkaline Phosphatase 108 38 - 126 U/L   Total Bilirubin 0.5 0.3 - 1.2 mg/dL   GFR calc non Af Amer >60 >60 mL/min   GFR calc Af Amer >60 >60 mL/min    Comment: (NOTE) The eGFR has been calculated using the CKD EPI equation. This calculation has not been validated in all clinical situations. eGFR's persistently <60 mL/min signify possible Chronic Kidney Disease.    Anion gap 10 5 - 15  D-dimer, quantitative (not at [facility])     Status: Abnormal   Collection Time: 05/23/17 11:51 AM  Result Value Ref Range   D-Dimer, Quant 1.12 (H) 0.00 - 0.50 ug/mL-FEU    Comment: (NOTE) At the manufacturer cut-off of 0.50 ug/mL FEU, this assay has been documented to exclude PE with a sensitivity and negative predictive value of 97 to 99%.  At this time, this assay has not been approved by the FDA to exclude DVT/VTE. Results should be correlated  with clinical presentation.   Troponin I     Status: None   Collection Time: 05/23/17 11:51 AM  Result Value Ref Range   Troponin I <0.03 <0.03 ng/mL  Urinalysis, Routine w reflex microscopic     Status: Abnormal   Collection Time: 05/23/17 11:51 AM  Result Value Ref Range   Color, Urine STRAW (A) YELLOW   APPearance CLEAR CLEAR   Specific Gravity, Urine 1.005 1.005 - 1.030   pH 7.0 5.0 - 8.0   Glucose, UA NEGATIVE NEGATIVE mg/dL   Hgb urine dipstick SMALL (A) NEGATIVE   Bilirubin Urine NEGATIVE NEGATIVE   Ketones, ur NEGATIVE NEGATIVE mg/dL   Protein, ur NEGATIVE NEGATIVE mg/dL   Nitrite NEGATIVE NEGATIVE   Leukocytes, UA NEGATIVE NEGATIVE   RBC / HPF 0-5 0 - 5 RBC/hpf   WBC, UA 0-5 0 - 5 WBC/hpf   Bacteria, UA NONE SEEN NONE SEEN   Squamous Epithelial / LPF 0-5 (A) NONE SEEN  Basic metabolic panel     Status: Abnormal   Collection Time: 05/24/17  6:54 AM  Result Value Ref Range   Sodium 138 135 - 145 mmol/L   Potassium 3.6 3.5 - 5.1 mmol/L   Chloride 97 (L) 101 - 111 mmol/L   CO2 29 22 - 32 mmol/L   Glucose, Bld 169 (H) 65 - 99 mg/dL   BUN 19 6 - 20 mg/dL   Creatinine, Ser 6.64 0.44 - 1.00 mg/dL   Calcium 9.4 8.9 - 70.4 mg/dL   GFR calc non Af Amer >60 >60 mL/min   GFR calc Af Amer >60 >60 mL/min    Comment: (NOTE) The eGFR has been calculated using the CKD EPI equation. This calculation has not been validated in all clinical situations. eGFR's persistently <60 mL/min signify possible Chronic Kidney Disease.    Anion gap 12 5 - 15  CBC with Differential/Platelet     Status: None   Collection Time: 05/24/17  6:54 AM  Result Value Ref Range   WBC 7.4 4.0 - 10.5 K/uL   RBC 4.52 3.87 - 5.11 MIL/uL   Hemoglobin 13.0 12.0 - 15.0 g/dL   HCT 35.5 78.0 - 06.3 %   MCV 88.3 78.0 - 100.0 fL   MCH 28.8 26.0 - 34.0 pg   MCHC 32.6 30.0 - 36.0 g/dL   RDW 21.5 31.9 - 81.3 %  Platelets 257 150 - 400 K/uL   Neutrophils Relative % 89 %   Neutro Abs 6.6 1.7 - 7.7 K/uL    Lymphocytes Relative 10 %   Lymphs Abs 0.7 0.7 - 4.0 K/uL   Monocytes Relative 1 %   Monocytes Absolute 0.1 0.1 - 1.0 K/uL   Eosinophils Relative 0 %   Eosinophils Absolute 0.0 0.0 - 0.7 K/uL   Basophils Relative 0 %   Basophils Absolute 0.0 0.0 - 0.1 K/uL  Basic metabolic panel     Status: Abnormal   Collection Time: 05/25/17  6:33 AM  Result Value Ref Range   Sodium 137 135 - 145 mmol/L   Potassium 4.2 3.5 - 5.1 mmol/L   Chloride 99 (L) 101 - 111 mmol/L   CO2 28 22 - 32 mmol/L   Glucose, Bld 151 (H) 65 - 99 mg/dL   BUN 32 (H) 6 - 20 mg/dL   Creatinine, Ser 6.19 0.44 - 1.00 mg/dL   Calcium 9.4 8.9 - 71.8 mg/dL   GFR calc non Af Amer >60 >60 mL/min   GFR calc Af Amer >60 >60 mL/min    Comment: (NOTE) The eGFR has been calculated using the CKD EPI equation. This calculation has not been validated in all clinical situations. eGFR's persistently <60 mL/min signify possible Chronic Kidney Disease.    Anion gap 10 5 - 15  Magnesium     Status: None   Collection Time: 05/25/17  6:33 AM  Result Value Ref Range   Magnesium 2.2 1.7 - 2.4 mg/dL    ABGS No results for input(s): PHART, PO2ART, TCO2, HCO3 in the last 72 hours.  Invalid input(s): PCO2 CULTURES No results found for this or any previous visit (from the past 240 hour(s)). Studies/Results: Dg Chest 2 View  Result Date: 05/23/2017 CLINICAL DATA:  Former smoker with current history of COPD and CHF presenting with three-day history of cough and shortness of breath. EXAM: CHEST  2 VIEW COMPARISON:  10/07/2016, 10/04/2016 and earlier. FINDINGS: The patient's chin overlies the medial lung apices. Cardiac silhouette upper normal in size to slightly enlarged, unchanged. Thoracic aorta tortuous and markedly atherosclerotic, unchanged. Hilar and mediastinal contours otherwise unremarkable. Emphysematous changes throughout both lungs, unchanged. Blunting of both costophrenic angles, best seen on lateral image. Linear scarring in the  lower lobes, unchanged. Lungs otherwise clear. Pulmonary vascularity normal. Exaggeration of the usual thoracic kyphosis with generalized osseous demineralization as noted previously. IMPRESSION: 1. COPD/emphysema. Borderline to mild cardiomegaly. No acute cardiopulmonary disease. 2. Chronic pleuroparenchymal scarring at the bases versus small chronic bilateral pleural effusions. Scarring is favored. 3. Thoracic aortic atherosclerosis. Aortic Atherosclerosis (ICD10-170.0) Emphysema. (HUX26-T97.9) Electronically Signed   By: Hulan Saas M.D.   On: 05/23/2017 12:14   Ct Angio Chest Pe W And/or Wo Contrast  Result Date: 05/23/2017 CLINICAL DATA:  Cough and shortness of breath for several days EXAM: CT ANGIOGRAPHY CHEST WITH CONTRAST TECHNIQUE: Multidetector CT imaging of the chest was performed using the standard protocol during bolus administration of intravenous contrast. Multiplanar CT image reconstructions and MIPs were obtained to evaluate the vascular anatomy. CONTRAST:  100 mL Isovue 370. COMPARISON:  Plain film from earlier in the same day. FINDINGS: Cardiovascular: Diffuse atherosclerotic calcifications of the thoracic aorta are noted. The origins of the great vessels are patent but also shows some mild calcifications. No aneurysmal dilatation or dissection is seen. No significant cardiac enlargement is noted. Diffuse coronary calcifications are noted. The pulmonary artery shows a normal branching pattern without  evidence of definitive pulmonary embolism. Mediastinum/Nodes: Thoracic inlet demonstrates some nodular changes within the right lobe of the thyroid. Scattered calcifications are noted as well. No significant hilar or mediastinal adenopathy is identified. Scattered calcified hilar lymph nodes are noted particularly on the right consistent with prior granulomatous disease. The esophagus is within normal limits. Lungs/Pleura: Lungs are well aerated bilaterally. Some minimal scarring is noted in  the right lower lobe. No focal infiltrate or sizable effusion is seen. Upper Abdomen: Right renal cyst is noted. Changes of prior cholecystectomy are seen. Musculoskeletal: Severe kyphosis of the thoracic spine is noted. No definitive compression deformity is seen. Mild osteophytic changes are noted. Review of the MIP images confirms the above findings. IMPRESSION: No evidence of pulmonary emboli. Chronic changes as described above. Aortic Atherosclerosis (ICD10-I70.0). Electronically Signed   By: Inez Catalina M.D.   On: 05/23/2017 16:04   US Venous Img Lower Unilateral Right  Result Date: 05/23/2017 CLINICAL DATA:  Initial evaluation for acute right lower extremity edema with swelling for 1 week. EXAM: Right LOWER EXTREMITY VENOUS DOPPLER ULTRASOUND TECHNIQUE: Gray-scale sonography with graded compression, as well as color Doppler and duplex ultrasound were performed to evaluate the lower extremity deep venous systems from the level of the common femoral vein and including the common femoral, femoral, profunda femoral, popliteal and calf veins including the posterior tibial, peroneal and gastrocnemius veins when visible. The superficial great saphenous vein was also interrogated. Spectral Doppler was utilized to evaluate flow at rest and with distal augmentation maneuvers in the common femoral, femoral and popliteal veins. COMPARISON:  None. FINDINGS: Contralateral Common Femoral Vein: Respiratory phasicity is normal and symmetric with the symptomatic side. No evidence of thrombus. Normal compressibility. Common Femoral Vein: No evidence of thrombus. Normal compressibility, respiratory phasicity and response to augmentation. Saphenofemoral Junction: No evidence of thrombus. Normal compressibility and flow on color Doppler imaging. Profunda Femoral Vein: No evidence of thrombus. Normal compressibility and flow on color Doppler imaging. Femoral Vein: No evidence of thrombus. Normal compressibility, respiratory  phasicity and response to augmentation. Popliteal Vein: No evidence of thrombus. Normal compressibility, respiratory phasicity and response to augmentation. Calf Veins: No evidence of thrombus. Normal compressibility and flow on color Doppler imaging. Superficial Great Saphenous Vein: No evidence of thrombus. Normal compressibility and flow on color Doppler imaging. Venous Reflux:  None. Other Findings:  None. IMPRESSION: No evidence of DVT within the right lower extremity. Electronically Signed   By: Jeannine Boga M.D.   On: 05/23/2017 17:56    Medications:  Prior to Admission:  Prescriptions Prior to Admission  Medication Sig Dispense Refill Last Dose  . ANORO ELLIPTA 62.5-25 MCG/INH AEPB Inhale 1 puff into the lungs daily.   05/22/2017 at Unknown time  . aspirin EC 81 MG tablet Take 81 mg by mouth daily.   05/23/2017 at Unknown time  . ciprofloxacin (CIPRO) 250 MG tablet Take 250 mg by mouth 2 (two) times daily.   05/23/2017 at Unknown time  . clonazePAM (KLONOPIN) 0.5 MG tablet Take 0.125 mg by mouth daily as needed for anxiety.    unknown  . feeding supplement, ENSURE ENLIVE, (ENSURE ENLIVE) LIQD Take 237 mLs by mouth 2 (two) times daily between meals. (Patient taking differently: Take 237 mLs by mouth daily. ) 237 mL 12 03/04/2017 at Unknown time  . furosemide (LASIX) 40 MG tablet Take 0.5 tablets (20 mg total) by mouth daily. 30 tablet 3 05/23/2017 at Unknown time  . lisinopril (PRINIVIL,ZESTRIL) 2.5 MG tablet TAKE 1 TABLET  BY MOUTH EVERY DAY 90 tablet 3 05/23/2017 at Unknown time  . metoprolol succinate (TOPROL-XL) 50 MG 24 hr tablet TAKE 1 AND 1/2 TABLETS DAILY 135 tablet 1 05/23/2017 at 0830  . potassium chloride SA (K-DUR,KLOR-CON) 20 MEQ tablet Take 1 tablet (20 mEq total) by mouth 3 (three) times a week. Takes only when Furosemide is taken (Patient taking differently: Take 20 mEq by mouth daily. Takes only when Furosemide is taken) 30 tablet 3 05/23/2017 at Unknown time   Scheduled: .  aspirin EC  81 mg Oral Daily  . ciprofloxacin  250 mg Oral BID  . enoxaparin (LOVENOX) injection  40 mg Subcutaneous Q24H  . feeding supplement (ENSURE ENLIVE)  237 mL Oral BID BM  . furosemide  20 mg Intravenous Once  . lisinopril  2.5 mg Oral Daily  . methylPREDNISolone (SOLU-MEDROL) injection  60 mg Intravenous Q12H  . metoprolol succinate  75 mg Oral Daily  . potassium chloride SA  20 mEq Oral Daily  . sodium chloride flush  3 mL Intravenous Q12H   Continuous: . sodium chloride     PPI:RJJOAC chloride, acetaminophen, clonazePAM, levalbuterol, metoprolol tartrate, ondansetron (ZOFRAN) IV, sodium chloride flush  Assesment: She was admitted with acute on chronic hypoxic respiratory failure, COPD exacerbation she had a urinary tract infection and she had some element of acute on chronic diastolic heart failure. She's had some cardiac arrhythmias now. She is on metoprolol which she has tolerated well despite her COPD. Her cardiac arrhythmias are asymptomatic. I think it may have been related to the use of albuterol regularly here in the hospital. She does not use her albuterol much at all at home. Principal Problem:   Acute on chronic respiratory failure with hypoxia (HCC) Active Problems:   Hypertension   COPD exacerbation (HCC)   Chronic diastolic CHF (congestive heart failure) ([place])    Plan: Cardiology consultation has been requested to see if she needs change in her medications. If she is cleared I would plan to let her go home.    LOS: 2 days   Savvy Peeters L 05/25/2017, 8:18 AM

## 2017-05-26 NOTE — Discharge Summary (Signed)
Physician Discharge Summary  Patient ID: Hannah Mckee MRN: [id number] DOB/AGE: 81-Jan-1932 81 y.o. Primary Care Physician:Jedidiah Demartini, Percell Miller, MD Admit date: 05/23/2017 Discharge date: 05/26/2017    Discharge Diagnoses:   Principal Problem:   Acute on chronic respiratory failure with hypoxia [facility]) Active Problems:   Hypertension   COPD exacerbation (HCC)   Chronic diastolic CHF (congestive heart failure) (HCC) anxiety   Allergies as of 05/25/2017      Reactions   Erythromycin Swelling   Levaquin [levofloxacin] Other (See Comments)   Tongue turns red and sore   Penicillins Swelling   Has patient had a PCN reaction causing immediate rash, facial/tongue/throat swelling, SOB or lightheadedness with hypotension: Yes Has patient had a PCN reaction causing severe rash involving mucus membranes or skin necrosis: No Has patient had a PCN reaction that required hospitalization No Has patient had a PCN reaction occurring within the last 10 years: No If all of the above answers are "NO", then may proceed with Cephalosporin use.   Sulfa Antibiotics Other (See Comments)   Tongue turns red and sore      Medication List    TAKE these medications   ANORO ELLIPTA 62.5-25 MCG/INH Aepb Generic drug:  umeclidinium-vilanterol Inhale 1 puff into the lungs daily.   aspirin EC 81 MG tablet Take 81 mg by mouth daily.   ciprofloxacin 250 MG tablet Commonly known as:  CIPRO Take 250 mg by mouth 2 (two) times daily.   clonazePAM 0.5 MG tablet Commonly known as:  KLONOPIN Take 0.125 mg by mouth daily as needed for anxiety.   feeding supplement (ENSURE ENLIVE) Liqd Take 237 mLs by mouth 2 (two) times daily between meals. What changed:  when to take this   furosemide 40 MG tablet Commonly known as:  LASIX Take 0.5 tablets (20 mg total) by mouth daily.   lisinopril 2.5 MG tablet Commonly known as:  PRINIVIL,ZESTRIL TAKE 1 TABLET BY MOUTH EVERY DAY   metoprolol succinate 50 MG 24 hr  tablet Commonly known as:  TOPROL-XL TAKE 1 AND 1/2 TABLETS DAILY   potassium chloride SA 20 MEQ tablet Commonly known as:  K-DUR,KLOR-CON Take 1 tablet (20 mEq total) by mouth 3 (three) times a week. Takes only when Furosemide is taken What changed:  when to take this  additional instructions       Discharged Condition:improved    Consults:cardiology  Significant Diagnostic Studies: Dg Chest 2 View  Result Date: 05/23/2017 CLINICAL DATA:  Former smoker with current history of COPD and CHF presenting with three-day history of cough and shortness of breath. EXAM: CHEST  2 VIEW COMPARISON:  10/07/2016, 10/04/2016 and earlier. FINDINGS: The patient's chin overlies the medial lung apices. Cardiac silhouette upper normal in size to slightly enlarged, unchanged. Thoracic aorta tortuous and markedly atherosclerotic, unchanged. Hilar and mediastinal contours otherwise unremarkable. Emphysematous changes throughout both lungs, unchanged. Blunting of both costophrenic angles, best seen on lateral image. Linear scarring in the lower lobes, unchanged. Lungs otherwise clear. Pulmonary vascularity normal. Exaggeration of the usual thoracic kyphosis with generalized osseous demineralization as noted previously. IMPRESSION: 1. COPD/emphysema. Borderline to mild cardiomegaly. No acute cardiopulmonary disease. 2. Chronic pleuroparenchymal scarring at the bases versus small chronic bilateral pleural effusions. Scarring is favored. 3. Thoracic aortic atherosclerosis. Aortic Atherosclerosis (ICD10-170.0) Emphysema. ([id number]-X91.9) Electronically Signed   By: Evangeline Dakin M.D.   On: 05/23/2017 12:14   Ct Angio Chest Pe W And/or Wo Contrast  Result Date: 05/23/2017 CLINICAL DATA:  Cough and shortness of breath for  several days EXAM: CT ANGIOGRAPHY CHEST WITH CONTRAST TECHNIQUE: Multidetector CT imaging of the chest was performed using the standard protocol during bolus administration of intravenous contrast.  Multiplanar CT image reconstructions and MIPs were obtained to evaluate the vascular anatomy. CONTRAST:  100 mL Isovue 370. COMPARISON:  Plain film from earlier in the same day. FINDINGS: Cardiovascular: Diffuse atherosclerotic calcifications of the thoracic aorta are noted. The origins of the great vessels are patent but also shows some mild calcifications. No aneurysmal dilatation or dissection is seen. No significant cardiac enlargement is noted. Diffuse coronary calcifications are noted. The pulmonary artery shows a normal branching pattern without evidence of definitive pulmonary embolism. Mediastinum/Nodes: Thoracic inlet demonstrates some nodular changes within the right lobe of the thyroid. Scattered calcifications are noted as well. No significant hilar or mediastinal adenopathy is identified. Scattered calcified hilar lymph nodes are noted particularly on the right consistent with prior granulomatous disease. The esophagus is within normal limits. Lungs/Pleura: Lungs are well aerated bilaterally. Some minimal scarring is noted in the right lower lobe. No focal infiltrate or sizable effusion is seen. Upper Abdomen: Right renal cyst is noted. Changes of prior cholecystectomy are seen. Musculoskeletal: Severe kyphosis of the thoracic spine is noted. No definitive compression deformity is seen. Mild osteophytic changes are noted. Review of the MIP images confirms the above findings. IMPRESSION: No evidence of pulmonary emboli. Chronic changes as described above. Aortic Atherosclerosis (ICD10-I70.0). Electronically Signed   By: Inez Catalina M.D.   On: 05/23/2017 16:04   US Venous Img Lower Unilateral Right  Result Date: 05/23/2017 CLINICAL DATA:  Initial evaluation for acute right lower extremity edema with swelling for 1 week. EXAM: Right LOWER EXTREMITY VENOUS DOPPLER ULTRASOUND TECHNIQUE: Gray-scale sonography with graded compression, as well as color Doppler and duplex ultrasound were performed to  evaluate the lower extremity deep venous systems from the level of the common femoral vein and including the common femoral, femoral, profunda femoral, popliteal and calf veins including the posterior tibial, peroneal and gastrocnemius veins when visible. The superficial great saphenous vein was also interrogated. Spectral Doppler was utilized to evaluate flow at rest and with distal augmentation maneuvers in the common femoral, femoral and popliteal veins. COMPARISON:  None. FINDINGS: Contralateral Common Femoral Vein: Respiratory phasicity is normal and symmetric with the symptomatic side. No evidence of thrombus. Normal compressibility. Common Femoral Vein: No evidence of thrombus. Normal compressibility, respiratory phasicity and response to augmentation. Saphenofemoral Junction: No evidence of thrombus. Normal compressibility and flow on color Doppler imaging. Profunda Femoral Vein: No evidence of thrombus. Normal compressibility and flow on color Doppler imaging. Femoral Vein: No evidence of thrombus. Normal compressibility, respiratory phasicity and response to augmentation. Popliteal Vein: No evidence of thrombus. Normal compressibility, respiratory phasicity and response to augmentation. Calf Veins: No evidence of thrombus. Normal compressibility and flow on color Doppler imaging. Superficial Great Saphenous Vein: No evidence of thrombus. Normal compressibility and flow on color Doppler imaging. Venous Reflux:  None. Other Findings:  None. IMPRESSION: No evidence of DVT within the right lower extremity. Electronically Signed   By: Jeannine Boga M.D.   On: 05/23/2017 17:56    Lab Results: Basic Metabolic Panel:  Recent Labs  05/24/17 0654 05/25/17 0633  NA 138 137  K 3.6 4.2  CL 97* 99*  CO2 29 28  GLUCOSE 169* 151*  BUN 19 32*  CREATININE 0.79 0.77  CALCIUM 9.4 9.4  MG  --  2.2   Liver Function Tests:  Recent Labs  05/23/17  1151  AST 40  ALT 33  ALKPHOS 108  BILITOT 0.5   PROT 7.1  ALBUMIN 3.5     CBC:  Recent Labs  05/23/17 1151 05/24/17 0654  WBC 11.2* 7.4  NEUTROABS 8.6* 6.6  HGB 13.0 13.0  HCT 40.6 39.9  MCV 90.0 88.3  PLT 252 257    No results found for this or any previous visit (from the past 240 hour(s)).   Hospital Course: She was admitted with shortness of breath and hypoxia. She was treated with IV antibiotics IV steroids inhaled bronchodilators and improved. She also received IV diuretics. She admitted that she had not been following her diet. She had some tachycardia during her hospitalization which prompted cardiology consultation. They did not feel that she needed to have any change in her medications. By the time of discharge she said she felt very well and was ready to go home.  Discharge Exam: Blood pressure 97/83, pulse (!) 56, temperature 98.1 F (36.7 C), temperature source Oral, resp. rate 18, height 4\' 8"  (1.422 m), weight 52.8 kg (116 lb 6.5 oz), SpO2 98 %. She is awake and alert. Chest is clear. Heart is regular. She has trace edema of her right leg which is chronic  Disposition: Home.      Signed: Adhira Jamil L   05/26/2017, 8:07 AM

## 2017-06-09 ENCOUNTER — Encounter: Payer: Self-pay | Admitting: Adult Health

## 2017-06-09 ENCOUNTER — Ambulatory Visit (INDEPENDENT_AMBULATORY_CARE_PROVIDER_SITE_OTHER): Payer: Medicare Other | Admitting: Adult Health

## 2017-06-09 VITALS — BP 126/72 | HR 88 | Ht <= 58 in | Wt 116.0 lb

## 2017-06-09 DIAGNOSIS — J410 Simple chronic bronchitis: Secondary | ICD-10-CM

## 2017-06-09 DIAGNOSIS — I10 Essential (primary) hypertension: Secondary | ICD-10-CM | POA: Diagnosis not present

## 2017-06-09 DIAGNOSIS — I5022 Chronic systolic (congestive) heart failure: Secondary | ICD-10-CM | POA: Diagnosis not present

## 2017-06-09 NOTE — Patient Instructions (Addendum)
Medication Instructions:   Your physician recommends that you continue on your current medications as directed. Please refer to the Current Medication list given to you today.  Labwork:  NONE  Testing/Procedures:  NONE  Follow-Up:  Your physician recommends that you schedule a follow-up appointment in: 6 months with Dr Harl Bowie in [place]. You will receive a reminder letter in the mail in about 4 months reminding you to call and schedule your appointment. If you don't receive this letter, please contact our office.  Any Other Special Instructions Will Be Listed Below (If Applicable).  If you need a refill on your cardiac medications before your next appointment, please call your pharmacy.

## 2017-06-09 NOTE — Progress Notes (Signed)
Cardiology Office Note   Date:  06/09/2017   ID:  Hannah Mckee, DOB 06/05/1931, MRN [id number]  PCP:  Sinda Du, MD  Cardiologist:  [facility]   Chief Complaint  Patient presents with  . Congestive Heart Failure  . Hypertension  . Shortness of Breath      History of Present Illness: Hannah Mckee is a 83 y.o. female who presents for ongoing assessment and management of chronic systolic CHF, EF of 56%-31%, post hospitalization with improved EF of 50-60% with Grade I diastolic dysfunction.Other history includes COPD with recent exacerbation during hospitalization, She was discharged on 05/25/2017 after diureses and treatment with antibiotics and steroids. Discharge weight 116 lbs. She continued to have trace edema in her right leg which is chronic.   She is doing well since discharge. She occasionally had unilateral LEE on the right. She takes an extra 1/2 tablet of lasix if this occurs She is supposed to be on O2 at HS. She has stopped it because she had a nose bleed. Nose got too dry.  She weighs herself daily., and is watching salt intake.   Past Medical History:  Diagnosis Date  . CHF (congestive heart failure) ([place])    a. EF 35-40% by echo in 10/2016 b. improved to 55-60% by echo in 04/2017  . COPD (chronic obstructive pulmonary disease) ([facility])    no home O2  . Hypertension   . Kidney stones   . Osteoarthritis   . Salivary gland stone   . Sepsis [facility])     Past Surgical History:  Procedure Laterality Date  . bilateral cataract surg    . BUNIONECTOMY    . CHOLECYSTECTOMY    . COLONOSCOPY  11/16/2012   Procedure: COLONOSCOPY;  Surgeon: Rogene Houston, MD;  [facility];  Service: Endoscopy;  Laterality: N/A;  100  . LITHOTRIPSY    . ORIF PERIPROSTHETIC FRACTURE Right 10/29/2014   Procedure: OPEN REDUCTION INTERNAL FIXATION (ORIF) PERIPROSTHETIC FEMUR FRACTURE/FEMUR SHAFT;  Surgeon: Mcarthur Rossetti, MD;  Location: [place];  Service: Orthopedics;   Laterality: Right;  . TONSILLECTOMY    . TOTAL HIP ARTHROPLASTY     1989     Current Outpatient Prescriptions  Medication Sig Dispense Refill  . ANORO ELLIPTA 62.5-25 MCG/INH AEPB Inhale 1 puff into the lungs daily.    Marland Kitchen aspirin EC 81 MG tablet Take 81 mg by mouth daily.    . ciprofloxacin (CIPRO) 250 MG tablet Take 250 mg by mouth 2 (two) times daily.    . clonazePAM (KLONOPIN) 0.5 MG tablet Take 0.125 mg by mouth daily as needed for anxiety.     . feeding supplement, ENSURE ENLIVE, (ENSURE ENLIVE) LIQD Take 237 mLs by mouth 2 (two) times daily between meals. (Patient taking differently: Take 237 mLs by mouth daily. ) 237 mL 12  . furosemide (LASIX) 40 MG tablet Take 0.5 tablets (20 mg total) by mouth daily. 30 tablet 3  . lisinopril (PRINIVIL,ZESTRIL) 2.5 MG tablet TAKE 1 TABLET BY MOUTH EVERY DAY 90 tablet 3  . metoprolol succinate (TOPROL-XL) 50 MG 24 hr tablet TAKE 1 AND 1/2 TABLETS DAILY 135 tablet 1  . potassium chloride SA (K-DUR,KLOR-CON) 20 MEQ tablet Take 1 tablet (20 mEq total) by mouth 3 (three) times a week. Takes only when Furosemide is taken (Patient taking differently: Take 20 mEq by mouth daily. Takes only when Furosemide is taken) 30 tablet 3   No current facility-administered medications for this visit.  Allergies:   Erythromycin; Levaquin [levofloxacin]; Penicillins; and Sulfa antibiotics    Social History:  The patient  reports that she quit smoking about 20 years ago. Her smoking use included Cigarettes. She has a 17.50 pack-year smoking history. She has never used smokeless tobacco. She reports that she does not drink alcohol or use drugs.   Family History:  The patient's family history includes Hypertension in her sister.    ROS: All other systems are reviewed and negative. Unless otherwise mentioned in H&P    PHYSICAL EXAM: VS:  BP 126/72 (BP Location: Left Arm)   Pulse 88   Ht 4\' 8"  (1.422 m)   Wt 116 lb (52.6 kg)   SpO2 94%   BMI 26.01 kg/m  ,  BMI Body mass index is 26.01 kg/m. GEN: Well nourished, well developed, in no acute distress  HEENT: normal  Neck: no JVD, carotid bruits, or masses Cardiac: RRR; no murmurs, rubs, or gallops,no edema  Respiratory:  Bibasilar crackles, no wheezes.  GI: soft, nontender, nondistended, + BS MS: no deformity or atrophy  Skin: warm and dry, no rash Neuro:  Strength and sensation are intact Psych: euthymic mood, full affect   Recent Labs: 05/23/2017: ALT 33; B Natriuretic Peptide 200.0 05/24/2017: Hemoglobin 13.0; Platelets 257 05/25/2017: BUN 32; Creatinine, Ser 0.77; Magnesium 2.2; Potassium 4.2; Sodium 137; TSH 0.112    Lipid Panel No results found for: CHOL, TRIG, HDL, CHOLHDL, VLDL, LDLCALC, LDLDIRECT    Wt Readings from Last 3 Encounters:  06/09/17 116 lb (52.6 kg)  05/25/17 116 lb 6.5 oz (52.8 kg)  03/15/17 113 lb 6.4 oz (51.4 kg)      Other studies Reviewed: Echocardiogram 30-Apr-2017 Left ventricle: The cavity size was normal. Wall thickness was   normal. Systolic function was normal. The estimated ejection   fraction was in the range of 55% to 60%. Doppler parameters are   consistent with abnormal left ventricular relaxation (grade 1   diastolic dysfunction). Doppler parameters are consistent with   high ventricular filling pressure. - Aortic valve: Mildly calcified annulus. Trileaflet; normal   thickness leaflets. There was mild regurgitation. Valve area   (VTI): 1.98 cm^2. Valve area (Vmax): 2.1 cm^2. Valve area   (Vmean): 2.03 cm^2. - Mitral valve: Mildly calcified annulus. Normal thickness leaflets   . There was mild regurgitation. - Left atrium: The atrium was moderately dilated. - Right atrium: The atrium was mildly dilated. - Pulmonary arteries: Systolic pressure was mildly increased. PA   peak pressure: 38 mm Hg (S). - Technically adequate study.  ASSESSMENT AND PLAN:  1.Chronic Systolic CHF: She is maintaining her weight and avoiding salt. She sometimes has  more edema in the right LE and she takes an extra dose of lasix. Will not make any changes at this time. Continue medication regimen.   2. O2 Dependent COPD: She is not wearing O2 as directed which is causing fatigue. I have suggested that she use NaSol spray in her nostrils for moistening to avoid dryness. She is willing to do this, and begin wearing the O2 as directed.   3. Hypertension: BP is well controlled. Will keep her on BB and ACE for now.    Current medicines are reviewed at length with the patient today.    Labs/ tests ordered today include:  Phill Myron. [place] Pugh, ANP, AACC   06/09/2017 5:41 PM    [facility] [address], [place], [place] [address] Phone: [phone]; Fax: [phone]

## 2017-06-24 ENCOUNTER — Emergency Department (HOSPITAL_COMMUNITY): Payer: Medicare Other

## 2017-06-24 ENCOUNTER — Observation Stay (HOSPITAL_COMMUNITY)
Admission: EM | Admit: 2017-06-24 | Discharge: 2017-06-26 | Disposition: A | Discharge status: Home / Self Care | Payer: Medicare Other | Attending: Pulmonary Disease | Admitting: Pulmonary Disease

## 2017-06-24 ENCOUNTER — Encounter (HOSPITAL_COMMUNITY): Payer: Self-pay

## 2017-06-24 DIAGNOSIS — I5032 Chronic diastolic (congestive) heart failure: Secondary | ICD-10-CM | POA: Diagnosis not present

## 2017-06-24 DIAGNOSIS — I110 Hypertensive heart disease with heart failure: Secondary | ICD-10-CM | POA: Insufficient documentation

## 2017-06-24 DIAGNOSIS — Z87891 Personal history of nicotine dependence: Secondary | ICD-10-CM | POA: Insufficient documentation

## 2017-06-24 DIAGNOSIS — I10 Essential (primary) hypertension: Secondary | ICD-10-CM | POA: Diagnosis present

## 2017-06-24 DIAGNOSIS — Z79899 Other long term (current) drug therapy: Secondary | ICD-10-CM | POA: Insufficient documentation

## 2017-06-24 DIAGNOSIS — R079 Chest pain, unspecified: Principal | ICD-10-CM | POA: Insufficient documentation

## 2017-06-24 DIAGNOSIS — Z7982 Long term (current) use of aspirin: Secondary | ICD-10-CM | POA: Insufficient documentation

## 2017-06-24 DIAGNOSIS — J449 Chronic obstructive pulmonary disease, unspecified: Secondary | ICD-10-CM | POA: Diagnosis present

## 2017-06-24 DIAGNOSIS — Z96643 Presence of artificial hip joint, bilateral: Secondary | ICD-10-CM | POA: Insufficient documentation

## 2017-06-24 LAB — BASIC METABOLIC PANEL
Anion gap: 9 (ref 5–15)
BUN: 24 mg/dL — AB (ref 6–20)
CHLORIDE: 99 mmol/L — AB (ref 101–111)
CO2: 31 mmol/L (ref 22–32)
Calcium: 9.8 mg/dL (ref 8.9–10.3)
Creatinine, Ser: 0.78 mg/dL (ref 0.44–1.00)
GFR calc Af Amer: >60 mL/min (ref >60)
GFR calc non Af Amer: >60 mL/min (ref >60)
GLUCOSE: 112 mg/dL — AB (ref 65–99)
POTASSIUM: 3.8 mmol/L (ref 3.5–5.1)
Sodium: 139 mmol/L (ref 135–145)

## 2017-06-24 LAB — CBC
HEMATOCRIT: 38.6 % (ref 36.0–46.0)
HEMOGLOBIN: 12.6 g/dL (ref 12.0–15.0)
MCH: 29.3 pg (ref 26.0–34.0)
MCHC: 32.6 g/dL (ref 30.0–36.0)
MCV: 89.8 fL (ref 78.0–100.0)
Platelets: 262 10*3/uL (ref 150–400)
RBC: 4.3 MIL/uL (ref 3.87–5.11)
RDW: 13.9 % (ref 11.5–15.5)
WBC: 11.8 10*3/uL — AB (ref 4.0–10.5)

## 2017-06-24 LAB — I-STAT TROPONIN, ED: Troponin i, poc: 0 ng/mL (ref 0.00–0.08)

## 2017-06-24 NOTE — ED Triage Notes (Signed)
Pt arrives via [facility] after 1 hour of chest pain, states she took 4 baby aspirin at home and by the time ems arrived the pain was gone.  Pt denies pain at this time.

## 2017-06-24 NOTE — ED Provider Notes (Signed)
[facility] DEPT Provider Note   CSN: [id number] Arrival date & time: 06/24/17  2235     History   Chief Complaint Chief Complaint  Patient presents with  . Chest Pain    HPI Hannah Mckee is a 83 y.o. female.  The history is provided by the patient and a relative.  Chest Pain   This is a new problem. Episode onset: just prior to arrival. The problem occurs constantly. The problem has been resolved. The pain is associated with rest. The pain is present in the substernal region. The pain is moderate. The quality of the pain is described as pressure-like. The pain does not radiate. Duration of episode(s) is 30 minutes. Pertinent negatives include no diaphoresis, no fever, no shortness of breath and no vomiting. Treatments tried: ASA. The treatment provided significant relief. Risk factors include being elderly.  pt with known h/o CHF/COPD presents with chest pain This occurred at rest earlier tonight It is now resolved She does not recall having this pain recently  Past Medical History:  Diagnosis Date  . CHF (congestive heart failure) ([place])    a. EF 35-40% by echo in 10/2016 b. improved to 55-60% by echo in 04/2017  . COPD (chronic obstructive pulmonary disease) ([facility])    no home O2  . Hypertension   . Kidney stones   . Osteoarthritis   . Salivary gland stone   . Sepsis [facility])     Patient Active Problem List   Diagnosis Date Noted  . Chronic diastolic CHF (congestive heart failure) ([place]) 05/23/2017  . COPD exacerbation ([place]) 10/11/2016  . Muscle weakness (generalized)   . Sinus tachycardia   . Lobar pneumonia, unspecified organism ([place])   . CAP (community acquired pneumonia) 10/04/2016  . Sepsis ([place]) 10/04/2016  . Acute on chronic respiratory failure with hypoxia ([place]) 10/04/2016  . Elevated troponin 10/04/2016  . Hypokalemia 10/04/2016  . Moderate malnutrition ([place]) 10/04/2016  . Right femoral fracture ([place]) 10/28/2014  . COPD (chronic obstructive pulmonary  disease) ([place])   . Fall   . Unspecified constipation 04/16/2013  . Diverticulitis of colon without hemorrhage 04/16/2013  . Rectal bleed 11/06/2012  . Hypertension 11/06/2012    Past Surgical History:  Procedure Laterality Date  . bilateral cataract surg    . BUNIONECTOMY    . CHOLECYSTECTOMY    . COLONOSCOPY  11/16/2012   Procedure: COLONOSCOPY;  Surgeon: Rogene Houston, MD;  [facility];  Service: Endoscopy;  Laterality: N/A;  100  . LITHOTRIPSY    . ORIF PERIPROSTHETIC FRACTURE Right 10/29/2014   Procedure: OPEN REDUCTION INTERNAL FIXATION (ORIF) PERIPROSTHETIC FEMUR FRACTURE/FEMUR SHAFT;  Surgeon: Mcarthur Rossetti, MD;  Location: [place];  Service: Orthopedics;  Laterality: Right;  . TONSILLECTOMY    . TOTAL HIP ARTHROPLASTY     1989    OB History    Gravida Para Term Preterm AB Living             4   SAB TAB Ectopic Multiple Live Births                   Home Medications    Prior to Admission medications   Medication Sig Start Date End Date Taking? Authorizing Provider  ANORO ELLIPTA 62.5-25 MCG/INH AEPB Inhale 1 puff into the lungs daily. 02/17/17   [provider]  aspirin EC 81 MG tablet Take 81 mg by mouth daily.    [provider]  ciprofloxacin (CIPRO) 250 MG tablet Take  250 mg by mouth 2 (two) times daily.    [provider]  clonazePAM (KLONOPIN) 0.5 MG tablet Take 0.125 mg by mouth daily as needed for anxiety.     [provider]  feeding supplement, ENSURE ENLIVE, (ENSURE ENLIVE) LIQD Take 237 mLs by mouth 2 (two) times daily between meals. Patient taking differently: Take 237 mLs by mouth daily.  10/11/16   Dhungel, Flonnie Overman, MD  furosemide (LASIX) 40 MG tablet Take 0.5 tablets (20 mg total) by mouth daily. 10/21/16   Lendon Colonel, NP  lisinopril (PRINIVIL,ZESTRIL) 2.5 MG tablet TAKE 1 TABLET BY MOUTH EVERY DAY 01/24/17   Arnoldo Lenis, MD  metoprolol succinate (TOPROL-XL) 50 MG 24 hr tablet TAKE  1 AND 1/2 TABLETS DAILY 05/03/17   Arnoldo Lenis, MD  potassium chloride SA (K-DUR,KLOR-CON) 20 MEQ tablet Take 1 tablet (20 mEq total) by mouth 3 (three) times a week. Takes only when Furosemide is taken Patient taking differently: Take 20 mEq by mouth daily. Takes only when Furosemide is taken 10/22/16   Lendon Colonel, NP    Family History Family History  Problem Relation Age of Onset  . Hypertension Sister     Social History Social History  Substance Use Topics  . Smoking status: Former Smoker    Packs/day: 0.50    Years: 35.00    Types: Cigarettes    Quit date: 01/1997  . Smokeless tobacco: Never Used  . Alcohol use No     Allergies   Erythromycin; Levaquin [levofloxacin]; Penicillins; and Sulfa antibiotics   Review of Systems Review of Systems  Constitutional: Negative for diaphoresis and fever.  Respiratory: Negative for shortness of breath.   Cardiovascular: Positive for chest pain.  Gastrointestinal: Negative for vomiting.  All other systems reviewed and are negative.    Physical Exam Updated Vital Signs BP (!) 164/69 (BP Location: Right Arm)   Pulse 78   Temp 97.7 F (36.5 C) (Oral)   Resp 15   Ht 1.422 m (4\' 8" )   Wt 52.6 kg (116 lb)   SpO2 96%   BMI 26.01 kg/m   Physical Exam  CONSTITUTIONAL: elderly/frail, no acute distress HEAD: Normocephalic/atraumatic EYES: EOMI ENMT: Mucous membranes moist NECK: supple no meningeal signs SPINE/BACK:kyphotic spine CV: S1/S2 noted, no loud harsh murmurs LUNGS: Lungs are clear to auscultation bilaterally, no apparent distress ABDOMEN: soft, nontender  GU:no cva tenderness NEURO: Pt is awake/alert/appropriate, moves all extremitiesx4. EXTREMITIES: pulses normal/equal, full ROM.  Minimal pitting edema to bilateral LE SKIN: warm, color normal PSYCH: no abnormalities of mood noted, alert and oriented to situation  ED Treatments / Results  Labs (all labs ordered are listed, but only abnormal  results are displayed) Labs Reviewed  BASIC METABOLIC PANEL - Abnormal; Notable for the following:       Result Value   Chloride 99 (*)    Glucose, Bld 112 (*)    BUN 24 (*)    All other components within normal limits  CBC - Abnormal; Notable for the following:    WBC 11.8 (*)    All other components within normal limits  I-STAT TROPONIN, ED    EKG  EKG Interpretation  Date/Time:  Friday June 24 2017 22:47:42 EDT Ventricular Rate:  70 PR Interval:    QRS Duration: 110 QT Interval:  445 QTC Calculation: 481 R Axis:   8 Text Interpretation:  Sinus rhythm Multiple premature complexes, vent & supraven Borderline repol abnrm, anterolateral leads Interpretation limited secondary to artifact  Confirmed by Ripley Fraise [phone]) on 06/24/2017 11:09:33 PM       Radiology Dg Chest 2 View  Result Date: 06/25/2017 CLINICAL DATA:  Chest pain resolved. EXAM: CHEST  2 VIEW COMPARISON:  Radiographs and CT 05/23/2017 FINDINGS: The cardiomediastinal contours are unchanged with atherosclerosis of the thoracic aorta. Lower lung volumes from prior exam leading to bronchovascular crowding. Chronic hyperinflation. Pulmonary vasculature is normal. No consolidation, pleural effusion, or pneumothorax. No acute osseous abnormalities are seen. Exaggerated thoracic kyphosis. IMPRESSION: 1. No acute abnormality.  Chronic hyperinflation. 2.  Aortic Atherosclerosis (ICD10-I70.0). Electronically Signed   By: Jeb Levering M.D.   On: 06/25/2017 00:07    Procedures Procedures (including critical care time)  Medications Ordered in ED Medications  diphenhydrAMINE (BENADRYL) capsule 25 mg (25 mg Oral Given 06/25/17 0101)     Initial Impression / Assessment and Plan / ED Course  I have reviewed the triage vital signs and the nursing notes.  Pertinent labs & imaging results that were available during my care of the patient were reviewed by me and considered in my medical decision making (see chart for  details).     12:55 AM Pt stable No new CP She is having a rash/urticaria from unknown source.  Will give benadryl.  No angioedema, no difficulty breathing   No meds given here, last meds she had was ASA at home Due to age/history/risk factors, will admit for ACS rule out Pt agreeable She is without CP at this time 1:16 AM D/w Dr Darrick Meigs for admission  Final Clinical Impressions(s) / ED Diagnoses   Final diagnoses:  Chest pain, rule out acute myocardial infarction    New Prescriptions New Prescriptions   No medications on file     Ripley Fraise, MD 06/25/17 0117

## 2017-06-25 DIAGNOSIS — J42 Unspecified chronic bronchitis: Secondary | ICD-10-CM | POA: Diagnosis not present

## 2017-06-25 DIAGNOSIS — I10 Essential (primary) hypertension: Secondary | ICD-10-CM

## 2017-06-25 DIAGNOSIS — R079 Chest pain, unspecified: Secondary | ICD-10-CM | POA: Diagnosis not present

## 2017-06-25 LAB — CBC
HCT: 38.8 % (ref 36.0–46.0)
Hemoglobin: 12.2 g/dL (ref 12.0–15.0)
MCH: 28.4 pg (ref 26.0–34.0)
MCHC: 31.4 g/dL (ref 30.0–36.0)
MCV: 90.4 fL (ref 78.0–100.0)
PLATELETS: 272 10*3/uL (ref 150–400)
RBC: 4.29 MIL/uL (ref 3.87–5.11)
RDW: 13.8 % (ref 11.5–15.5)
WBC: 10.4 10*3/uL (ref 4.0–10.5)

## 2017-06-25 LAB — TROPONIN I: Troponin I: <0.03 ng/mL (ref <0.03)

## 2017-06-25 LAB — CREATININE, SERUM
Creatinine, Ser: 0.71 mg/dL (ref 0.44–1.00)
GFR calc Af Amer: >60 mL/min (ref >60)
GFR calc non Af Amer: >60 mL/min (ref >60)

## 2017-06-25 MED ORDER — LISINOPRIL 5 MG PO TABS
2.5000 mg | ORAL_TABLET | Freq: Every day | ORAL | Status: DC
  Administered 2017-06-25: 2.5 mg via ORAL
  Filled 2017-06-25: qty 1

## 2017-06-25 MED ORDER — CLONAZEPAM 0.5 MG PO TABS: 0.2500 mg | ORAL_TABLET | Freq: Every day | ORAL | Status: DC | PRN

## 2017-06-25 MED ORDER — ACETAMINOPHEN 325 MG PO TABS
650.0000 mg | ORAL_TABLET | Freq: Four times a day (QID) | ORAL | Status: DC | PRN
  Administered 2017-06-25 – 2017-06-26 (×2): 650 mg via ORAL
  Filled 2017-06-25 (×2): qty 2

## 2017-06-25 MED ORDER — POTASSIUM CHLORIDE CRYS ER 20 MEQ PO TBCR
20.0000 meq | EXTENDED_RELEASE_TABLET | Freq: Every day | ORAL | Status: DC
  Administered 2017-06-25: 20 meq via ORAL
  Filled 2017-06-25 (×2): qty 1

## 2017-06-25 MED ORDER — METOPROLOL SUCCINATE ER 50 MG PO TB24
75.0000 mg | ORAL_TABLET | Freq: Every day | ORAL | Status: DC
  Administered 2017-06-25 – 2017-06-26 (×2): 75 mg via ORAL
  Filled 2017-06-25 (×2): qty 1

## 2017-06-25 MED ORDER — FUROSEMIDE 20 MG PO TABS
20.0000 mg | ORAL_TABLET | Freq: Every day | ORAL | Status: DC
  Administered 2017-06-25: 20 mg via ORAL
  Filled 2017-06-25 (×2): qty 1

## 2017-06-25 MED ORDER — ASPIRIN EC 81 MG PO TBEC
81.0000 mg | DELAYED_RELEASE_TABLET | Freq: Every day | ORAL | Status: DC
  Administered 2017-06-25 – 2017-06-26 (×2): 81 mg via ORAL
  Filled 2017-06-25 (×2): qty 1

## 2017-06-25 MED ORDER — ONDANSETRON HCL 4 MG/2ML IJ SOLN: 4.0000 mg | Freq: Four times a day (QID) | INTRAMUSCULAR | Status: DC | PRN

## 2017-06-25 MED ORDER — ENOXAPARIN SODIUM 40 MG/0.4ML ~~LOC~~ SOLN
40.0000 mg | SUBCUTANEOUS | Status: DC
  Administered 2017-06-25: 40 mg via SUBCUTANEOUS
  Filled 2017-06-25 (×2): qty 0.4

## 2017-06-25 MED ORDER — UMECLIDINIUM-VILANTEROL 62.5-25 MCG/INH IN AEPB
1.0000 | INHALATION_SPRAY | Freq: Every day | RESPIRATORY_TRACT | Status: DC
  Administered 2017-06-25 – 2017-06-26 (×2): 1 via RESPIRATORY_TRACT
  Filled 2017-06-25: qty 14

## 2017-06-25 MED ORDER — MORPHINE SULFATE (PF) 2 MG/ML IV SOLN: 1.0000 mg | INTRAVENOUS | Status: DC | PRN

## 2017-06-25 MED ORDER — ENSURE ENLIVE PO LIQD
237.0000 mL | Freq: Every day | ORAL | Status: DC
  Administered 2017-06-25: 237 mL via ORAL

## 2017-06-25 MED ORDER — DIPHENHYDRAMINE HCL 25 MG PO CAPS
25.0000 mg | ORAL_CAPSULE | Freq: Once | ORAL | Status: AC
  Administered 2017-06-25: 25 mg via ORAL
  Filled 2017-06-25: qty 1

## 2017-06-25 NOTE — Progress Notes (Signed)
Subjective: She was brought in for observation with chest discomfort. This is a second episode of this. She is known to have heart failure which is actually somewhat better but is not known to have coronary disease. She's not had any pain actually since before she arrived at the emergency department she did take aspirin at home before she came no nitroglycerin.  Objective: Vital signs in last 24 hours: Temp:  [97.6 F (36.4 C)-97.8 F (36.6 C)] 97.8 F (36.6 C) (08/25 0650) Pulse Rate:  [65-78] 76 (08/25 0650) Resp:  [14-22] 20 (08/25 0650) BP: (115-164)/(48-69) 130/51 (08/25 0650) SpO2:  [96 %-99 %] 99 % (08/25 0650) Weight:  [52.6 kg (116 lb)-54.3 kg (119 lb 9.6 oz)] 54.3 kg (119 lb 9.6 oz) (08/25 0300) Weight change:  Last BM Date: 06/24/17  Intake/Output from previous day: No intake/output data recorded.  PHYSICAL EXAM General appearance: alert, cooperative and mild distress Resp: rhonchi bilaterally Cardio: regular rate and rhythm, S1, S2 normal, no murmur, click, rub or gallop GI: soft, non-tender; bowel sounds normal; no masses,  no organomegaly Extremities: extremities normal, atraumatic, no cyanosis or edema Skin warm and dry  Lab Results:  Results for orders placed or performed during the hospital encounter of 06/24/17 (from the past 48 hour(s))  Basic metabolic panel     Status: Abnormal   Collection Time: 06/24/17 11:06 PM  Result Value Ref Range   Sodium 139 135 - 145 mmol/Mckee   Potassium 3.8 3.5 - 5.1 mmol/Mckee   Chloride 99 (Mckee) 101 - 111 mmol/Mckee   CO2 31 22 - 32 mmol/Mckee   Glucose, Bld 112 (H) 65 - 99 mg/dL   BUN 24 (H) 6 - 20 mg/dL   Creatinine, Ser 0.78 0.44 - 1.00 mg/dL   Calcium 9.8 8.9 - 10.3 mg/dL   GFR calc non Af Amer >60 >60 mL/min   GFR calc Af Amer >60 >60 mL/min    Comment: (NOTE) The eGFR has been calculated using the CKD EPI equation. This calculation has not been validated in all clinical situations. eGFR's persistently <60 mL/min signify possible  Chronic Kidney Disease.    Anion gap 9 5 - 15  CBC     Status: Abnormal   Collection Time: 06/24/17 11:06 PM  Result Value Ref Range   WBC 11.8 (H) 4.0 - 10.5 K/uL   RBC 4.30 3.87 - 5.11 MIL/uL   Hemoglobin 12.6 12.0 - 15.0 g/dL   HCT 38.6 36.0 - 46.0 %   MCV 89.8 78.0 - 100.0 fL   MCH 29.3 26.0 - 34.0 pg   MCHC 32.6 30.0 - 36.0 g/dL   RDW 13.9 11.5 - 15.5 %   Platelets 262 150 - 400 K/uL  I-stat troponin, ED     Status: None   Collection Time: 06/24/17 11:25 PM  Result Value Ref Range   Troponin i, poc 0.00 0.00 - 0.08 ng/mL   Comment 3            Comment: Due to the release kinetics of cTnI, a negative result within the first hours of the onset of symptoms does not rule out myocardial infarction with certainty. If myocardial infarction is still suspected, repeat the test at appropriate intervals.   CBC     Status: None   Collection Time: 06/25/17  3:47 AM  Result Value Ref Range   WBC 10.4 4.0 - 10.5 K/uL   RBC 4.29 3.87 - 5.11 MIL/uL   Hemoglobin 12.2 12.0 - 15.0 g/dL     HCT 38.8 36.0 - 46.0 %   MCV 90.4 78.0 - 100.0 fL   MCH 28.4 26.0 - 34.0 pg   MCHC 31.4 30.0 - 36.0 g/dL   RDW 13.8 11.5 - 15.5 %   Platelets 272 150 - 400 K/uL  Creatinine, serum     Status: None   Collection Time: 06/25/17  3:47 AM  Result Value Ref Range   Creatinine, Ser 0.71 0.44 - 1.00 mg/dL   GFR calc non Af Amer >60 >60 mL/min   GFR calc Af Amer >60 >60 mL/min    Comment: (NOTE) The eGFR has been calculated using the CKD EPI equation. This calculation has not been validated in all clinical situations. eGFR's persistently <60 mL/min signify possible Chronic Kidney Disease.   Troponin I (q 6hr x 3)     Status: None   Collection Time: 06/25/17  3:47 AM  Result Value Ref Range   Troponin I <0.03 <0.03 ng/mL  Troponin I (q 6hr x 3)     Status: None   Collection Time: 06/25/17  8:58 AM  Result Value Ref Range   Troponin I <0.03 <0.03 ng/mL    ABGS No results for input(s): PHART,  PO2ART, TCO2, HCO3 in the last 72 hours.  Invalid input(s): PCO2 CULTURES No results found for this or any previous visit (from the past 240 hour(s)). Studies/Results: Dg Chest 2 View  Result Date: 06/25/2017 CLINICAL DATA:  Chest pain resolved. EXAM: CHEST  2 VIEW COMPARISON:  Radiographs and CT 05/23/2017 FINDINGS: The cardiomediastinal contours are unchanged with atherosclerosis of the thoracic aorta. Lower lung volumes from prior exam leading to bronchovascular crowding. Chronic hyperinflation. Pulmonary vasculature is normal. No consolidation, pleural effusion, or pneumothorax. No acute osseous abnormalities are seen. Exaggerated thoracic kyphosis. IMPRESSION: 1. No acute abnormality.  Chronic hyperinflation. 2.  Aortic Atherosclerosis (ICD10-I70.0). Electronically Signed   By: Jeb Levering M.D.   On: 06/25/2017 00:07    Medications:  Prior to Admission:  Prescriptions Prior to Admission  Medication Sig Dispense Refill Last Dose  . ANORO ELLIPTA 62.5-25 MCG/INH AEPB Inhale 1 puff into the lungs daily.   Past Week at Unknown time  . aspirin EC 81 MG tablet Take 81 mg by mouth daily.   06/24/2017 at Unknown time  . clonazePAM (KLONOPIN) 0.5 MG tablet Take 0.125 mg by mouth daily as needed for anxiety.    unknown at unknown  . feeding supplement, ENSURE ENLIVE, (ENSURE ENLIVE) LIQD Take 237 mLs by mouth 2 (two) times daily between meals. (Patient taking differently: Take 237 mLs by mouth daily. ) 237 mL 12 06/24/2017 at Unknown time  . furosemide (LASIX) 40 MG tablet Take 0.5 tablets (20 mg total) by mouth daily. (Patient taking differently: Take 20 mg by mouth daily. Take 40 mg tablet when there is weight gain.) 30 tablet 3 06/24/2017 at Unknown time  . lisinopril (PRINIVIL,ZESTRIL) 2.5 MG tablet TAKE 1 TABLET BY MOUTH EVERY DAY 90 tablet 3 06/24/2017 at Unknown time  . metoprolol succinate (TOPROL-XL) 50 MG 24 hr tablet TAKE 1 AND 1/2 TABLETS DAILY 135 tablet 1 06/24/2017 at 9:30 am  .  potassium chloride SA (K-DUR,KLOR-CON) 20 MEQ tablet Take 1 tablet (20 mEq total) by mouth 3 (three) times a week. Takes only when Furosemide is taken (Patient taking differently: Take 20 mEq by mouth daily. Takes only when Furosemide is taken) 30 tablet 3 06/24/2017 at Unknown time   Scheduled: . aspirin EC  81 mg Oral Daily  . enoxaparin (  LOVENOX) injection  40 mg Subcutaneous Q24H  . feeding supplement (ENSURE ENLIVE)  237 mL Oral Daily  . furosemide  20 mg Oral Daily  . lisinopril  2.5 mg Oral Daily  . metoprolol succinate  75 mg Oral Daily  . potassium chloride SA  20 mEq Oral Daily  . umeclidinium-vilanterol  1 puff Inhalation Daily   Continuous:  ONG:[id number], clonazePAM, morphine injection, ondansetron (ZOFRAN) IV  Assesment: She has chest pain. This has resolved. Initial troponins are negative. I'm going to have her get up and move around today make sure all the troponins are negative and assuming that she is able to ambulate without chest pain we'll plan to discharge in the morning Active Problems:   Hypertension   COPD (chronic obstructive pulmonary disease) ([place])   Chest pain    Plan: As above    LOS: 0 days   Hannah Mckee 06/25/2017, 10:39 AM

## 2017-06-25 NOTE — H&P (Addendum)
TRH H&P    Patient Demographics:    Hannah Mckee, is a 83 y.o. female  MRN: [id number]  DOB - 04-10-1931  Admit Date - 06/24/2017  Referring MD/NP/PA: Dr. Christy Gentles  Outpatient Primary MD for the patient is Sinda Du, MD  Patient coming from: Home  Chief Complaint  Patient presents with  . Chest Pain      HPI:    Hannah Mckee  is a 83 y.o. female, With history of COPD, chronic diastolic CHF, hypertension, osteoarthritis who came to hospital after patient developed chest pain around 9 PM. Patient says that pain started like pressure on left side and moved toward the right side and then slightly pressured and central chest which lasted for 30 minutes. She denies shortness of breath. No nausea vomiting. At this time chest pain has resolved.  She does not have history of CAD, she does have history of diastolic CHF. She denies dysuria, diarrhea. No abdominal pain. Patient gets recurrent UTIs since she had bladder tack surgery and takes Cipro on a when necessary basis. She denies history of stroke or seizures. No TIAs.  In the ED first set of Cardiac enzymes are negative.    Review of systems:     All other systems reviewed and are negative.   With Past History of the following :    Past Medical History:  Diagnosis Date  . CHF (congestive heart failure) ([place])    a. EF 35-40% by echo in 10/2016 b. improved to 55-60% by echo in 04/2017  . COPD (chronic obstructive pulmonary disease) (HCC)    no home O2  . Hypertension   . Kidney stones   . Osteoarthritis   . Salivary gland stone   . Sepsis [facility])       Past Surgical History:  Procedure Laterality Date  . bilateral cataract surg    . BUNIONECTOMY    . CHOLECYSTECTOMY    . COLONOSCOPY  11/16/2012   Procedure: COLONOSCOPY;  Surgeon: Rogene [place], MD;  Location[facility];  Service: Endoscopy;  Laterality: N/A;  100  . LITHOTRIPSY     . ORIF PERIPROSTHETIC FRACTURE Right 10/29/2014   Procedure: OPEN REDUCTION INTERNAL FIXATION (ORIF) PERIPROSTHETIC FEMUR FRACTURE/FEMUR SHAFT;  Surgeon: Mcarthur Rossetti, MD;  Location: [place];  Service: Orthopedics;  Laterality: Right;  . TONSILLECTOMY    . TOTAL HIP ARTHROPLASTY     1989      Social History:      Social History  Substance Use Topics  . Smoking status: Former Smoker    Packs/day: 0.50    Years: 35.00    Types: Cigarettes    Quit date: 01/1997  . Smokeless tobacco: Never Used  . Alcohol use No       Family History :     Family History  Problem Relation Age of Onset  . Hypertension Sister       Home Medications:   Prior to Admission medications   Medication Sig Start Date End Date Taking? Authorizing Provider  ANORO ELLIPTA 62.5-25 MCG/INH  AEPB Inhale 1 puff into the lungs daily. 02/17/17   [provider]  aspirin EC 81 MG tablet Take 81 mg by mouth daily.    [provider]  ciprofloxacin (CIPRO) 250 MG tablet Take 250 mg by mouth 2 (two) times daily.    [provider]  clonazePAM (KLONOPIN) 0.5 MG tablet Take 0.125 mg by mouth daily as needed for anxiety.     [provider]  feeding supplement, ENSURE ENLIVE, (ENSURE ENLIVE) LIQD Take 237 mLs by mouth 2 (two) times daily between meals. Patient taking differently: Take 237 mLs by mouth daily.  10/11/16   Dhungel, Flonnie Overman, MD  furosemide (LASIX) 40 MG tablet Take 0.5 tablets (20 mg total) by mouth daily. 10/21/16   Lendon Colonel, NP  lisinopril (PRINIVIL,ZESTRIL) 2.5 MG tablet TAKE 1 TABLET BY MOUTH EVERY DAY 01/24/17   Arnoldo Lenis, MD  metoprolol succinate (TOPROL-XL) 50 MG 24 hr tablet TAKE 1 AND 1/2 TABLETS DAILY 05/03/17   Arnoldo Lenis, MD  potassium chloride SA (K-DUR,KLOR-CON) 20 MEQ tablet Take 1 tablet (20 mEq total) by mouth 3 (three) times a week. Takes only when Furosemide is taken Patient taking differently: Take 20 mEq by mouth  daily. Takes only when Furosemide is taken 10/22/16   Lendon Colonel, NP     Allergies:     Allergies  Allergen Reactions  . Erythromycin Swelling  . Levaquin [Levofloxacin] Other (See Comments)    Tongue turns red and sore  . Penicillins Swelling    Has patient had a PCN reaction causing immediate rash, facial/tongue/throat swelling, SOB or lightheadedness with hypotension: Yes Has patient had a PCN reaction causing severe rash involving mucus membranes or skin necrosis: No Has patient had a PCN reaction that required hospitalization No Has patient had a PCN reaction occurring within the last 10 years: No If all of the above answers are "NO", then may proceed with Cephalosporin use.   . Sulfa Antibiotics Other (See Comments)    Tongue turns red and sore     Physical Exam:   Vitals  Blood pressure (!) 141/61, pulse 65, temperature 97.7 F (36.5 C), temperature source Oral, resp. rate 16, height 4\' 8"  (1.422 m), weight 52.6 kg (116 lb), SpO2 98 %.  1.  General: Appears in no acute distress  2. Psychiatric:  Intact judgement and  insight, awake alert, oriented x 3.  3. Neurologic: No focal neurological deficits, all cranial nerves intact.Strength 5/5 all 4 extremities, sensation intact all 4 extremities, plantars down going.  4. Eyes :  anicteric sclerae, moist conjunctivae with no lid lag. PERRLA.  5. ENMT:  Oropharynx clear with moist mucous membranes and good dentition  6. Neck:  supple, no cervical lymphadenopathy appriciated, No thyromegaly  7. Respiratory : Normal respiratory effort, good air movement bilaterally,clear to  auscultation bilaterally  8. Cardiovascular : RRR, no gallops, rubs or murmurs, no leg edema  9. Gastrointestinal:  Positive bowel sounds, abdomen soft, non-tender to palpation,no hepatosplenomegaly, no rigidity or guarding       10. Skin:  No cyanosis, normal texture and turgor, no rash, lesions or ulcers  11.Musculoskeletal:    Good muscle tone,  joints appear normal , no effusions,  normal range of motion    Data Review:    CBC  Recent Labs Lab 06/24/17 2306  WBC 11.8*  HGB 12.6  HCT 38.6  PLT 262  MCV 89.8  MCH 29.3  MCHC 32.6  RDW 13.9   ------------------------------------------------------------------------------------------------------------------  Chemistries   Recent Labs Lab 06/24/17 2306  NA 139  K 3.8  CL 99*  CO2 31  GLUCOSE 112*  BUN 24*  CREATININE 0.78  CALCIUM 9.8   ------------------------------------------------------------------------------------------------------------------  ------------------------------------------------------------------------------------------------------------------  --------------------------------------------------------------------------------------------------------------- Urine analysis:    Component Value Date/Time   COLORURINE STRAW (A) 05/23/2017 1151   APPEARANCEUR CLEAR 05/23/2017 1151   LABSPEC 1.005 05/23/2017 1151   PHURINE 7.0 05/23/2017 1151   GLUCOSEU NEGATIVE 05/23/2017 1151   HGBUR SMALL (A) 05/23/2017 1151   BILIRUBINUR NEGATIVE 05/23/2017 1151   KETONESUR NEGATIVE 05/23/2017 1151   PROTEINUR NEGATIVE 05/23/2017 1151   NITRITE NEGATIVE 05/23/2017 1151   LEUKOCYTESUR NEGATIVE 05/23/2017 1151      Imaging Results:    Dg Chest 2 View  Result Date: 06/25/2017 CLINICAL DATA:  Chest pain resolved. EXAM: CHEST  2 VIEW COMPARISON:  Radiographs and CT 05/23/2017 FINDINGS: The cardiomediastinal contours are unchanged with atherosclerosis of the thoracic aorta. Lower lung volumes from prior exam leading to bronchovascular crowding. Chronic hyperinflation. Pulmonary vasculature is normal. No consolidation, pleural effusion, or pneumothorax. No acute osseous abnormalities are seen. Exaggerated thoracic kyphosis. IMPRESSION: 1. No acute abnormality.  Chronic hyperinflation. 2.  Aortic Atherosclerosis (ICD10-I70.0). Electronically  Signed   By: Jeb Levering M.D.   On: 06/25/2017 00:07    My personal review of EKG: Rhythm NSR   Assessment & Plan:    Active Problems:   Hypertension   COPD (chronic obstructive pulmonary disease) (HCC)   Chest pain   1. Chest pain-place under observation, rule out ACS. We will cycle troponin every 6 hours 3. Chest pain has resolved at this time. Start morphine 1 mg every 4 hours when necessary for pain 2. COPD-Stable, continue Anoro Ellipta. 3. Chronic diastolic CHF- euvolemic, continue Lasix, metoprolol. 4. Hypertension-blood pressure stable, continue metoprolol.   DVT Prophylaxis-   Lovenox   AM Labs Ordered, also please review Full Orders  Family Communication: Admission, patients condition and plan of care including tests being ordered have been discussed with the patient * who indicate understanding and agree with the plan and Code Status.  Code Status:  Full code  Admission status: Observation    Time spent in minutes : 60 minutes   Daniella Dewberry S M.D on 06/25/2017 at 1:53 AM  Between 7am to 7pm - Pager - [phone]. After 7pm go to [email or web address] - password [facility]  [facility] - Office  [phone]

## 2017-06-26 MED ORDER — DIPHENHYDRAMINE HCL 25 MG PO CAPS
25.0000 mg | ORAL_CAPSULE | Freq: Four times a day (QID) | ORAL | Status: DC | PRN
  Administered 2017-06-26: 25 mg via ORAL
  Filled 2017-06-26: qty 1

## 2017-06-26 MED ORDER — PREDNISONE 10 MG (21) PO TBPK: ORAL_TABLET | ORAL | 0 refills | Status: DC

## 2017-06-26 NOTE — Progress Notes (Signed)
Pt responding well to the 25mg  Benadryl, hives on the back of left leg has mostly resolved. Pt no longer c/o pruritis in fact pt was asleep when entering the room. Will pass on to next shift nurse.

## 2017-06-26 NOTE — Progress Notes (Signed)
Pt called c/o of  itching on her legs and arm. Pt has large areas of redness under her lt thigh and back of lt arm.  + hives and itching. Dr Darrick Meigs Paged.

## 2017-06-26 NOTE — Progress Notes (Signed)
Subjective: She says she feels better. She had rash again yesterday. She says that in the past she had some sort of an allergy to lisinopril and I wonder if that's what's giving her the rash. Her blood pressure has been okay so I think it's safe to discontinue it for now.  Objective: Vital signs in last 24 hours: Temp:  [98 F (36.7 C)-98.2 F (36.8 C)] 98.2 F (36.8 C) (08/26 0601) Pulse Rate:  [54-86] 77 (08/26 0601) Resp:  [20] 20 (08/26 0601) BP: (117-144)/(40-54) 117/40 (08/26 0601) SpO2:  [93 %-97 %] 93 % (08/26 0601) Weight change:  Last BM Date: 06/24/17  Intake/Output from previous day: 08/25 0701 - 08/26 0700 In: 480 [P.O.:480] Out: 900 [Urine:900]  PHYSICAL EXAM General appearance: alert, cooperative and no distress Resp: clear to auscultation bilaterally Cardio: regular rate and rhythm, S1, S2 normal, no murmur, click, rub or gallop GI: soft, non-tender; bowel sounds normal; no masses,  no organomegaly Extremities: extremities normal, atraumatic, no cyanosis or edema Skin is clear now  Lab Results:  Results for orders placed or performed during the hospital encounter of 06/24/17 (from the past 48 hour(s))  Basic metabolic panel     Status: Abnormal   Collection Time: 06/24/17 11:06 PM  Result Value Ref Range   Sodium 139 135 - 145 mmol/L   Potassium 3.8 3.5 - 5.1 mmol/L   Chloride 99 (L) 101 - 111 mmol/L   CO2 31 22 - 32 mmol/L   Glucose, Bld 112 (H) 65 - 99 mg/dL   BUN 24 (H) 6 - 20 mg/dL   Creatinine, Ser 1.25 0.44 - 1.00 mg/dL   Calcium 9.8 8.9 - 89.0 mg/dL   GFR calc non Af Amer >60 >60 mL/min   GFR calc Af Amer >60 >60 mL/min    Comment: (NOTE) The eGFR has been calculated using the CKD EPI equation. This calculation has not been validated in all clinical situations. eGFR's persistently <60 mL/min signify possible Chronic Kidney Disease.    Anion gap 9 5 - 15  CBC     Status: Abnormal   Collection Time: 06/24/17 11:06 PM  Result Value Ref Range    WBC 11.8 (H) 4.0 - 10.5 K/uL   RBC 4.30 3.87 - 5.11 MIL/uL   Hemoglobin 12.6 12.0 - 15.0 g/dL   HCT 91.2 71.0 - 41.4 %   MCV 89.8 78.0 - 100.0 fL   MCH 29.3 26.0 - 34.0 pg   MCHC 32.6 30.0 - 36.0 g/dL   RDW 12.0 55.8 - 79.7 %   Platelets 262 150 - 400 K/uL  I-stat troponin, ED     Status: None   Collection Time: 06/24/17 11:25 PM  Result Value Ref Range   Troponin i, poc 0.00 0.00 - 0.08 ng/mL   Comment 3            Comment: Due to the release kinetics of cTnI, a negative result within the first hours of the onset of symptoms does not rule out myocardial infarction with certainty. If myocardial infarction is still suspected, repeat the test at appropriate intervals.   CBC     Status: None   Collection Time: 06/25/17  3:47 AM  Result Value Ref Range   WBC 10.4 4.0 - 10.5 K/uL   RBC 4.29 3.87 - 5.11 MIL/uL   Hemoglobin 12.2 12.0 - 15.0 g/dL   HCT 84.9 14.7 - 60.7 %   MCV 90.4 78.0 - 100.0 fL   MCH 28.4 26.0 -  34.0 pg   MCHC 31.4 30.0 - 36.0 g/dL   RDW 13.8 11.5 - 15.5 %   Platelets 272 150 - 400 K/uL  Creatinine, serum     Status: None   Collection Time: 06/25/17  3:47 AM  Result Value Ref Range   Creatinine, Ser 0.71 0.44 - 1.00 mg/dL   GFR calc non Af Amer >60 >60 mL/min   GFR calc Af Amer >60 >60 mL/min    Comment: (NOTE) The eGFR has been calculated using the CKD EPI equation. This calculation has not been validated in all clinical situations. eGFR's persistently <60 mL/min signify possible Chronic Kidney Disease.   Troponin I (q 6hr x 3)     Status: None   Collection Time: 06/25/17  3:47 AM  Result Value Ref Range   Troponin I <0.03 <0.03 ng/mL  Troponin I (q 6hr x 3)     Status: None   Collection Time: 06/25/17  8:58 AM  Result Value Ref Range   Troponin I <0.03 <0.03 ng/mL  Troponin I (q 6hr x 3)     Status: None   Collection Time: 06/25/17  2:28 PM  Result Value Ref Range   Troponin I <0.03 <0.03 ng/mL    ABGS No results for input(s): PHART,  PO2ART, TCO2, HCO3 in the last 72 hours.  Invalid input(s): PCO2 CULTURES No results found for this or any previous visit (from the past 240 hour(s)). Studies/Results: Dg Chest 2 View  Result Date: 06/25/2017 CLINICAL DATA:  Chest pain resolved. EXAM: CHEST  2 VIEW COMPARISON:  Radiographs and CT 05/23/2017 FINDINGS: The cardiomediastinal contours are unchanged with atherosclerosis of the thoracic aorta. Lower lung volumes from prior exam leading to bronchovascular crowding. Chronic hyperinflation. Pulmonary vasculature is normal. No consolidation, pleural effusion, or pneumothorax. No acute osseous abnormalities are seen. Exaggerated thoracic kyphosis. IMPRESSION: 1. No acute abnormality.  Chronic hyperinflation. 2.  Aortic Atherosclerosis (ICD10-I70.0). Electronically Signed   By: Jeb Levering M.D.   On: 06/25/2017 00:07    Medications:  Prior to Admission:  Prescriptions Prior to Admission  Medication Sig Dispense Refill Last Dose  . ANORO ELLIPTA 62.5-25 MCG/INH AEPB Inhale 1 puff into the lungs daily.   Past Week at Unknown time  . aspirin EC 81 MG tablet Take 81 mg by mouth daily.   06/24/2017 at Unknown time  . clonazePAM (KLONOPIN) 0.5 MG tablet Take 0.125 mg by mouth daily as needed for anxiety.    unknown at unknown  . feeding supplement, ENSURE ENLIVE, (ENSURE ENLIVE) LIQD Take 237 mLs by mouth 2 (two) times daily between meals. (Patient taking differently: Take 237 mLs by mouth daily. ) 237 mL 12 06/24/2017 at Unknown time  . furosemide (LASIX) 40 MG tablet Take 0.5 tablets (20 mg total) by mouth daily. (Patient taking differently: Take 20 mg by mouth daily. Take 40 mg tablet when there is weight gain.) 30 tablet 3 06/24/2017 at Unknown time  . lisinopril (PRINIVIL,ZESTRIL) 2.5 MG tablet TAKE 1 TABLET BY MOUTH EVERY DAY 90 tablet 3 06/24/2017 at Unknown time  . metoprolol succinate (TOPROL-XL) 50 MG 24 hr tablet TAKE 1 AND 1/2 TABLETS DAILY 135 tablet 1 06/24/2017 at 9:30 am  .  potassium chloride SA (K-DUR,KLOR-CON) 20 MEQ tablet Take 1 tablet (20 mEq total) by mouth 3 (three) times a week. Takes only when Furosemide is taken (Patient taking differently: Take 20 mEq by mouth daily. Takes only when Furosemide is taken) 30 tablet 3 06/24/2017 at Unknown time  Scheduled: . aspirin EC  81 mg Oral Daily  . enoxaparin (LOVENOX) injection  40 mg Subcutaneous Q24H  . feeding supplement (ENSURE ENLIVE)  237 mL Oral Daily  . furosemide  20 mg Oral Daily  . metoprolol succinate  75 mg Oral Daily  . potassium chloride SA  20 mEq Oral Daily  . umeclidinium-vilanterol  1 puff Inhalation Daily   Continuous:  NGI:[id number], clonazePAM, diphenhydrAMINE, morphine injection, ondansetron (ZOFRAN) IV  Assesment: She was admitted with chest pain which actually resolved by the time she got to the hospital. Additionally she is known to have COPD which is stable, hypertension and that's well-controlled with a potential reaction to lisinopril and she has heart failure which has dramatically improved. Active Problems:   Hypertension   COPD (chronic obstructive pulmonary disease) ([place])   Chest pain    Plan: Discharge home today    LOS: 0 days   Jayron Maqueda L 06/26/2017, 8:01 AM

## 2017-06-26 NOTE — Progress Notes (Signed)
Discharge instructions read to patient and her daughter.  Both verbalized understanding of all instructions.  Discharged to home with daughter. 

## 2017-07-01 NOTE — Discharge Summary (Signed)
Physician Discharge Summary  Patient ID: Hannah Mckee MRN: [id number] DOB/AGE: 1931/02/22 83 y.o. Primary Care Physician:Philip Kotlyar, Percell Miller, MD Admit date: 06/24/2017 Discharge date: 07/01/2017    Discharge Diagnoses:   Active Problems:   Hypertension   COPD (chronic obstructive pulmonary disease) (HCC)   Chest pain Chronic diastolic heart failure Anxiety Allergic reaction to lisinopril  Allergies as of 06/26/2017      Reactions   Erythromycin Swelling   Levaquin [levofloxacin] Other (See Comments)   Tongue turns red and sore   Penicillins Swelling   Has patient had a PCN reaction causing immediate rash, facial/tongue/throat swelling, SOB or lightheadedness with hypotension: Yes Has patient had a PCN reaction causing severe rash involving mucus membranes or skin necrosis: No Has patient had a PCN reaction that required hospitalization No Has patient had a PCN reaction occurring within the last 10 years: No If all of the above answers are "NO", then may proceed with Cephalosporin use.   Sulfa Antibiotics Other (See Comments)   Tongue turns red and sore      Medication List    STOP taking these medications   lisinopril 2.5 MG tablet Commonly known as:  PRINIVIL,ZESTRIL     TAKE these medications   ANORO ELLIPTA 62.5-25 MCG/INH Aepb Generic drug:  umeclidinium-vilanterol Inhale 1 puff into the lungs daily.   aspirin EC 81 MG tablet Take 81 mg by mouth daily.   clonazePAM 0.5 MG tablet Commonly known as:  KLONOPIN Take 0.125 mg by mouth daily as needed for anxiety.   feeding supplement (ENSURE ENLIVE) Liqd Take 237 mLs by mouth 2 (two) times daily between meals. What changed:  when to take this   furosemide 40 MG tablet Commonly known as:  LASIX Take 0.5 tablets (20 mg total) by mouth daily. What changed:  additional instructions   metoprolol succinate 50 MG 24 hr tablet Commonly known as:  TOPROL-XL TAKE 1 AND 1/2 TABLETS DAILY   potassium chloride SA 20  MEQ tablet Commonly known as:  K-DUR,KLOR-CON Take 1 tablet (20 mEq total) by mouth 3 (three) times a week. Takes only when Furosemide is taken What changed:  when to take this  additional instructions   predniSONE 10 MG (21) Tbpk tablet Commonly known as:  STERAPRED UNI-PAK 21 TAB Take by package instructions            Discharge Care Instructions        Start     Ordered   06/26/17 0000  predniSONE (STERAPRED UNI-PAK 21 TAB) 10 MG (21) TBPK tablet     06/26/17 0804   06/26/17 0000  Increase activity slowly     06/26/17 0804   06/26/17 0000  Diet - low sodium heart healthy     06/26/17 0804   06/26/17 0000  Call MD for:  temperature >100.4     06/26/17 0804   06/26/17 0000  Call MD for:  persistant nausea and vomiting     06/26/17 0804   06/26/17 0000  Call MD for:  severe uncontrolled pain     06/26/17 0804   06/26/17 0000  Call MD for:  difficulty breathing, headache or visual disturbances     06/26/17 0804      Discharged Condition:Improved    Consults: None  Significant Diagnostic Studies: Dg Chest 2 View  Result Date: 06/25/2017 CLINICAL DATA:  Chest pain resolved. EXAM: CHEST  2 VIEW COMPARISON:  Radiographs and CT 05/23/2017 FINDINGS: The cardiomediastinal contours are unchanged with atherosclerosis of the  thoracic aorta. Lower lung volumes from prior exam leading to bronchovascular crowding. Chronic hyperinflation. Pulmonary vasculature is normal. No consolidation, pleural effusion, or pneumothorax. No acute osseous abnormalities are seen. Exaggerated thoracic kyphosis. IMPRESSION: 1. No acute abnormality.  Chronic hyperinflation. 2.  Aortic Atherosclerosis (ICD10-I70.0). Electronically Signed   By: Jeb Levering M.D.   On: 06/25/2017 00:07    Lab Results: Basic Metabolic Panel: No results for input(s): NA, K, CL, CO2, GLUCOSE, BUN, CREATININE, CALCIUM, MG, PHOS in the last 72 hours. Liver Function Tests: No results for input(s): AST, ALT, ALKPHOS,  BILITOT, PROT, ALBUMIN in the last 72 hours.   CBC: No results for input(s): WBC, NEUTROABS, HGB, HCT, MCV, PLT in the last 72 hours.  No results found for this or any previous visit (from the past 240 hour(s)).   Hospital Course: She came to the hospital with chest pain. It had typical and atypical features. She is known to have cardiac disease with heart failure but it is not known if she has coronary disease. She has COPD at baseline. Her CHF is markedly improved. Her pain had resolved by the time she came to the emergency department she was admitted to rule out MI. She did rule out for acute coronary syndrome. She was able to get up and ambulate. She had an allergic reaction to lisinopril which has now been discontinued. She will have outpatient follow-up with cardiology to see if she needs ischemic testing.  Discharge Exam: Blood pressure (!) 117/40, pulse 77, temperature 98.2 F (36.8 C), temperature source Oral, resp. rate 20, height 4\' 8"  (1.422 m), weight 54.3 kg (119 lb 9.6 oz), SpO2 96 %. She is awake and alert and in no acute distress. Chest is clear. Heart is regular.  Disposition: Home she does not want home health services  Discharge Instructions    Call MD for:  difficulty breathing, headache or visual disturbances    Complete by:  As directed    Call MD for:  persistant nausea and vomiting    Complete by:  As directed    Call MD for:  severe uncontrolled pain    Complete by:  As directed    Call MD for:  temperature >100.4    Complete by:  As directed    Diet - low sodium heart healthy    Complete by:  As directed    Increase activity slowly    Complete by:  As directed         Signed: Tsuyako Jolley L   07/01/2017, 8:54 AM

## 2017-07-18 ENCOUNTER — Other Ambulatory Visit: Payer: Self-pay | Admitting: *Deleted

## 2017-07-18 MED ORDER — METOPROLOL SUCCINATE ER 50 MG PO TB24: ORAL_TABLET | ORAL | 3 refills | Status: DC

## 2017-08-01 ENCOUNTER — Ambulatory Visit: Payer: Medicare Other | Admitting: Cardiology

## 2017-08-11 DIAGNOSIS — M6281 Muscle weakness (generalized): Secondary | ICD-10-CM | POA: Diagnosis not present

## 2017-08-11 DIAGNOSIS — I5021 Acute systolic (congestive) heart failure: Secondary | ICD-10-CM | POA: Diagnosis not present

## 2017-08-29 ENCOUNTER — Telehealth: Payer: Self-pay | Admitting: *Deleted

## 2017-08-29 NOTE — Telephone Encounter (Signed)
Patient aware of appt change. 

## 2017-08-29 NOTE — Telephone Encounter (Signed)
Patient called requesting an appointment to see Dr. Harl Bowie for worsening SOB x's 3 weeks. [facility] RN informed her to call her cardiologist for the SOB. Gained 1 lbs in one month. Currently use oxgen 1 & 1/2 L via [place] as needed and reports when she uses it, the sob improves. Eating and drinking okay. Medications reconciled while on the phone. Offered sooner appointment with APP but declined stating if she has to pay $50, she wants to see a doctor. Patient given an appt to see Dr. Harl Bowie on 09/19/17 at the [facility] office and advised that if her symptoms get worse, that she needed to go to the ED for an evaluation. Patient verbalized understanding of plan.

## 2017-08-29 NOTE — Telephone Encounter (Signed)
I can see her this Wed at 120 in [place]. I have cc'd lisa as well on this, not sure if Alma Friendly is in this week  Zandra Abts MD

## 2017-08-31 ENCOUNTER — Encounter: Payer: Self-pay | Admitting: Cardiology

## 2017-08-31 ENCOUNTER — Ambulatory Visit (INDEPENDENT_AMBULATORY_CARE_PROVIDER_SITE_OTHER): Payer: Medicare Other | Admitting: Cardiology

## 2017-08-31 VITALS — BP 160/70 | HR 94 | Ht <= 58 in | Wt 122.8 lb

## 2017-08-31 DIAGNOSIS — Z79899 Other long term (current) drug therapy: Secondary | ICD-10-CM | POA: Diagnosis not present

## 2017-08-31 DIAGNOSIS — I10 Essential (primary) hypertension: Secondary | ICD-10-CM | POA: Diagnosis not present

## 2017-08-31 DIAGNOSIS — I5023 Acute on chronic systolic (congestive) heart failure: Secondary | ICD-10-CM | POA: Diagnosis not present

## 2017-08-31 NOTE — Patient Instructions (Signed)
Medication Instructions:  INCREASE LASIX TO 40 MG DAILY FOR NEXT 4 DAYS, THEN GO BACK TO 20 MG DAILY   Labwork Monday BMET MAGNESIUM   Testing/Procedures: NONE  Follow-Up: Your physician recommends that you schedule a follow-up appointment in: TO BE DETERMINED    Any Other Special Instructions Will Be Listed Below (If Applicable).  CALL WITH WEIGHTS AND SYMPTOMS ON Monday    If you need a refill on your cardiac medications before your next appointment, please call your pharmacy.

## 2017-08-31 NOTE — Progress Notes (Signed)
Clinical Summary Hannah Mckee is a 83 y.o.female seen today for follow up of the following medical problems.    1. Chronic systolic HF - echo 84/1660 LVEF 35-40% - 04/2017 echo LVEF 63-01%, grade I diastolic dysfunction. Mild AI.  - family has asked to defer ischemic testing.     - allergy to lisinopril causing rash   - recent SOB x 2 weeks. Variable in severity. No cough or wheezing. O2 levesl at home have been normal. Has had some intermittent LE edema. Home weights trending up to 122 lbs  - takes lasix 20mg  daily, takes 40mg  about 1-2 times per week - has some positional chest tightness. - elevated heart rates when she feels short of breath - avoiding NSAIDs.  - off prednisone 2 weeks ago.    2. COPD - followed by pcp  3. HTN - she remains compliant with meds.    Past Medical History:  Diagnosis Date  . CHF (congestive heart failure) ([place])    a. EF 35-40% by echo in 10/2016 b. improved to 55-60% by echo in 04/2017  . COPD (chronic obstructive pulmonary disease) ([facility])    no home O2  . Hypertension   . Kidney stones   . Osteoarthritis   . Salivary gland stone   . Sepsis ([place])      Allergies  Allergen Reactions  . Erythromycin Swelling  . Levaquin [Levofloxacin] Other (See Comments)    Tongue turns red and sore  . Penicillins Swelling    Has patient had a PCN reaction causing immediate rash, facial/tongue/throat swelling, SOB or lightheadedness with hypotension: Yes Has patient had a PCN reaction causing severe rash involving mucus membranes or skin necrosis: No Has patient had a PCN reaction that required hospitalization No Has patient had a PCN reaction occurring within the last 10 years: No If all of the above answers are "NO", then may proceed with Cephalosporin use.   . Sulfa Antibiotics Other (See Comments)    Tongue turns red and sore     Current Outpatient Prescriptions  Medication Sig Dispense Refill  . ANORO ELLIPTA 62.5-25 MCG/INH  AEPB Inhale 1 puff into the lungs daily.    Marland Kitchen aspirin EC 81 MG tablet Take 81 mg by mouth daily.    . clonazePAM (KLONOPIN) 0.5 MG tablet Take 0.125 mg by mouth daily as needed for anxiety.     . feeding supplement, ENSURE ENLIVE, (ENSURE ENLIVE) LIQD Take 237 mLs by mouth 2 (two) times daily between meals. (Patient taking differently: Take 237 mLs by mouth daily. ) 237 mL 12  . furosemide (LASIX) 40 MG tablet Take 0.5 tablets (20 mg total) by mouth daily. (Patient taking differently: Take 20 mg by mouth daily. Take 40 mg tablet when there is weight gain.) 30 tablet 3  . metoprolol succinate (TOPROL-XL) 50 MG 24 hr tablet TAKE 1 AND 1/2 TABLETS DAILY 135 tablet 3  . potassium chloride SA (K-DUR,KLOR-CON) 20 MEQ tablet Take 1 tablet (20 mEq total) by mouth 3 (three) times a week. Takes only when Furosemide is taken (Patient taking differently: Take 20 mEq by mouth daily. Takes only when Furosemide is taken) 30 tablet 3  . predniSONE (STERAPRED UNI-PAK 21 TAB) 10 MG (21) TBPK tablet Take by package instructions 21 tablet 0   No current facility-administered medications for this visit.      Past Surgical History:  Procedure Laterality Date  . bilateral cataract surg    . BUNIONECTOMY    .  CHOLECYSTECTOMY    . COLONOSCOPY  11/16/2012   Procedure: COLONOSCOPY;  Surgeon: Rogene Houston, MD;  [facility];  Service: Endoscopy;  Laterality: N/A;  100  . LITHOTRIPSY    . ORIF PERIPROSTHETIC FRACTURE Right 10/29/2014   Procedure: OPEN REDUCTION INTERNAL FIXATION (ORIF) PERIPROSTHETIC FEMUR FRACTURE/FEMUR SHAFT;  Surgeon: Mcarthur Rossetti, MD;  Location: [facility];  Service: Orthopedics;  Laterality: Right;  . TONSILLECTOMY    . TOTAL HIP ARTHROPLASTY     1989     Allergies  Allergen Reactions  . Erythromycin Swelling  . Levaquin [Levofloxacin] Other (See Comments)    Tongue turns red and sore  . Penicillins Swelling    Has patient had a PCN reaction causing immediate rash,  facial/tongue/throat swelling, SOB or lightheadedness with hypotension: Yes Has patient had a PCN reaction causing severe rash involving mucus membranes or skin necrosis: No Has patient had a PCN reaction that required hospitalization No Has patient had a PCN reaction occurring within the last 10 years: No If all of the above answers are "NO", then may proceed with Cephalosporin use.   . Sulfa Antibiotics Other (See Comments)    Tongue turns red and sore      Family History  Problem Relation Age of Onset  . Hypertension Sister      Social History Hannah Mckee reports that she quit smoking about 20 years ago. Her smoking use included Cigarettes. She has a 17.50 pack-year smoking history. She has never used smokeless tobacco. Hannah Mckee reports that she does not drink alcohol.   Review of Systems CONSTITUTIONAL: No weight loss, fever, chills, weakness or fatigue.  HEENT: Eyes: No visual loss, blurred vision, double vision or yellow sclerae.No hearing loss, sneezing, congestion, runny nose or sore throat.  SKIN: No rash or itching.  CARDIOVASCULAR: per hpi RESPIRATORY: per hpi GASTROINTESTINAL: No anorexia, nausea, vomiting or diarrhea. No abdominal pain or blood.  GENITOURINARY: No burning on urination, no polyuria NEUROLOGICAL: No headache, dizziness, syncope, paralysis, ataxia, numbness or tingling in the extremities. No change in bowel or bladder control.  MUSCULOSKELETAL: No muscle, back pain, joint pain or stiffness.  LYMPHATICS: No enlarged nodes. No history of splenectomy.  PSYCHIATRIC: No history of depression or anxiety.  ENDOCRINOLOGIC: No reports of sweating, cold or heat intolerance. No polyuria or polydipsia.  Marland Kitchen   Physical Examination Vitals:   08/31/17 1326  BP: (!) 160/70  Pulse: 94  SpO2: 93%   Vitals:   08/31/17 1326  Weight: 122 lb 12.8 oz (55.7 kg)  Height: 4\' 8"  (1.422 m)    Gen: resting comfortably, no acute distress HEENT: no scleral icterus,  pupils equal round and reactive, no palptable cervical adenopathy,  CV: RRR, no m/r/g, +JVD Resp: mild bilateral crackles GI: abdomen is soft, non-tender, non-distended, normal bowel sounds, no hepatosplenomegaly MSK: extremities are warm, trace bilateral edema  Skin: warm, no rash Neuro:  no focal deficits Psych: appropriate affect   Diagnostic Studies 10/2016 echo Study Conclusions  - Left ventricle: The cavity size was normal. Wall thickness was increased in a pattern of mild LVH. Systolic function was moderately reduced. The estimated ejection fraction was in the range of 35% to 40%. Diffuse hypokinesis. There is severe hypokinesis of the basalinferior myocardium. Features are consistent with a pseudonormal left ventricular filling pattern, with concomitant abnormal relaxation and increased filling pressure (grade 2 diastolic dysfunction). - Aortic valve: Moderately calcified annulus. Trileaflet. - Mitral valve: Calcified annulus. There was mild to moderate regurgitation. -  Left atrium: The atrium was severely dilated. - Right atrium: The atrium was mildly dilated. Central venous pressure (est): 3 mm Hg. - Atrial septum: No defect or patent foramen ovale was identified. - Tricuspid valve: There was moderate regurgitation. - Pulmonary arteries: PA peak pressure: 50 mm Hg (S). - Pericardium, extracardiac: There was no pericardial effusion.  Impressions:  - Mild LVH with LVEF approximately 35-40% in the setting of tachycardia. There is diffuse hypokinesis, most severe in the basal inferior wall. Probable grade 2 diastolic dysfunction. Severe left atrial enlargement. Calcified mitral annulus with mild to moderate mitral regurgitation. Moderately calcified aortic annulus. Moderate tricuspid regurgitation with PASP 15 mmHg.    Assessment and Plan  1. Acute on chronic systolic HF - evidence of fluid overload, SOB - she will increase lasix  to 40mg  daily x 3 days, then call and update [place] with her weights and symptoms - repeat BMET/Mg  2. HTN - elevated in clinic, follow with increased diuresis   F/u pending clinical response.        Arnoldo Lenis, M.D.

## 2017-09-04 ENCOUNTER — Encounter: Payer: Self-pay | Admitting: Cardiology

## 2017-09-05 ENCOUNTER — Telehealth: Payer: Self-pay

## 2017-09-05 NOTE — Telephone Encounter (Signed)
Patient states she has had no appetite in the last 3 days, so she has felt very weak due to not eating. Patient stated she was able to drink some pedialyte this morning. Swelling has improved, but Right ankle is still swollen.

## 2017-09-05 NOTE — Telephone Encounter (Signed)
Patient called to report weight after taking lasix 40 mg x 4 days. Weight today 120.4 lbs

## 2017-09-05 NOTE — Telephone Encounter (Signed)
Based on her scale she is down 2 lbs from what she told me at our appt. How is she feeling, how is the swelling doing  Zandra Abts MD

## 2017-09-08 DIAGNOSIS — Z79899 Other long term (current) drug therapy: Secondary | ICD-10-CM | POA: Diagnosis not present

## 2017-09-11 DIAGNOSIS — I5021 Acute systolic (congestive) heart failure: Secondary | ICD-10-CM | POA: Diagnosis not present

## 2017-09-11 DIAGNOSIS — M6281 Muscle weakness (generalized): Secondary | ICD-10-CM | POA: Diagnosis not present

## 2017-09-19 ENCOUNTER — Ambulatory Visit: Payer: Medicare Other | Admitting: Cardiology

## 2017-10-11 DIAGNOSIS — M6281 Muscle weakness (generalized): Secondary | ICD-10-CM | POA: Diagnosis not present

## 2017-10-11 DIAGNOSIS — I5021 Acute systolic (congestive) heart failure: Secondary | ICD-10-CM | POA: Diagnosis not present

## 2017-10-31 DIAGNOSIS — I5032 Chronic diastolic (congestive) heart failure: Secondary | ICD-10-CM | POA: Diagnosis not present

## 2017-10-31 DIAGNOSIS — J441 Chronic obstructive pulmonary disease with (acute) exacerbation: Secondary | ICD-10-CM | POA: Diagnosis not present

## 2017-10-31 DIAGNOSIS — I10 Essential (primary) hypertension: Secondary | ICD-10-CM | POA: Diagnosis not present

## 2017-11-11 DIAGNOSIS — M6281 Muscle weakness (generalized): Secondary | ICD-10-CM | POA: Diagnosis not present

## 2017-11-11 DIAGNOSIS — I5021 Acute systolic (congestive) heart failure: Secondary | ICD-10-CM | POA: Diagnosis not present

## 2017-11-23 ENCOUNTER — Telehealth: Payer: Self-pay | Admitting: *Deleted

## 2017-11-23 NOTE — Telephone Encounter (Signed)
Pt daughter say for the last week BP running around 170s-190s/50s-70s HR remaining 80s c/o headaches and off and on chest pain - denies active chest pain at this time - denies dizziness swelling SOB. appt scheduled for tomorrow with Dr Harl Bowie - pt currently taking Toprol XL 75 mg daily - pt and daughter voiced understanding that if she started having active chest pain she should report to [facility] for further evaluation. Will forward to provider FYI

## 2017-11-24 ENCOUNTER — Ambulatory Visit (INDEPENDENT_AMBULATORY_CARE_PROVIDER_SITE_OTHER): Payer: Medicare Other | Admitting: Cardiology

## 2017-11-24 ENCOUNTER — Encounter: Payer: Self-pay | Admitting: Cardiology

## 2017-11-24 VITALS — BP 162/68 | HR 97 | Ht <= 58 in | Wt 118.0 lb

## 2017-11-24 DIAGNOSIS — I5022 Chronic systolic (congestive) heart failure: Secondary | ICD-10-CM | POA: Diagnosis not present

## 2017-11-24 DIAGNOSIS — I10 Essential (primary) hypertension: Secondary | ICD-10-CM | POA: Diagnosis not present

## 2017-11-24 MED ORDER — AMLODIPINE BESYLATE 2.5 MG PO TABS: 2.5000 mg | ORAL_TABLET | Freq: Every day | ORAL | 3 refills | Status: DC

## 2017-11-24 NOTE — Progress Notes (Signed)
Clinical Summary Hannah Mckee is a 84 y.o.female seen today for follow up of the following medical problems.    1. Chronic systolic HF - echo 19/3790 LVEF 35-40%. Familt asked to dere ischemic testing at that time.  - f/u echo after medical therapy showed normalization of LVEF. 04/2017 echo LVEF 24-09%, grade I diastolic dysfunction. Mild AI.  - lisionpril caused rash.    - recent SOB x 2 weeks. Variable in severity. No cough or wheezing. O2 levesl at home have been normal. Has had some intermittent LE edema. Home weights trending up to 122 lbs  - takes lasix 20mg  daily, takes 40mg  about 1-2 times per week - has some positional chest tightness. - elevated heart rates when she feels short of breath - avoiding NSAIDs.  - off prednisone 2 weeks ago.    2. COPD - followed by pcp  3. HTN - she remains compliant with meds.  - recent issues with elevated bp 160s-190s.  - no NSAIDs. -previously on prednisone. No recent edema - limited caffeine.   Past Medical History:  Diagnosis Date  . CHF (congestive heart failure) ([place])    a. EF 35-40% by echo in 10/2016 b. improved to 55-60% by echo in 04/2017  . COPD (chronic obstructive pulmonary disease) ([facility])    no home O2  . Hypertension   . Kidney stones   . Osteoarthritis   . Salivary gland stone   . Sepsis ([place])      Allergies  Allergen Reactions  . Erythromycin Swelling  . Levaquin [Levofloxacin] Other (See Comments)    Tongue turns red and sore  . Penicillins Swelling    Has patient had a PCN reaction causing immediate rash, facial/tongue/throat swelling, SOB or lightheadedness with hypotension: Yes Has patient had a PCN reaction causing severe rash involving mucus membranes or skin necrosis: No Has patient had a PCN reaction that required hospitalization No Has patient had a PCN reaction occurring within the last 10 years: No If all of the above answers are "NO", then may proceed with Cephalosporin use.   .  Sulfa Antibiotics Other (See Comments)    Tongue turns red and sore     Current Outpatient Medications  Medication Sig Dispense Refill  . ANORO ELLIPTA 62.5-25 MCG/INH AEPB Inhale 1 puff into the lungs daily.    Marland Kitchen aspirin EC 81 MG tablet Take 81 mg by mouth daily.    . ciprofloxacin (CIPRO) 250 MG tablet Take 250 mg by mouth 2 (two) times daily.  2  . clonazePAM (KLONOPIN) 0.5 MG tablet Take 0.125 mg by mouth daily as needed for anxiety.     . feeding supplement, ENSURE ENLIVE, (ENSURE ENLIVE) LIQD Take 237 mLs by mouth 2 (two) times daily between meals. (Patient taking differently: Take 237 mLs by mouth daily. ) 237 mL 12  . furosemide (LASIX) 40 MG tablet Take 0.5 tablets (20 mg total) by mouth daily. (Patient taking differently: Take 20 mg by mouth daily. Take 40 mg tablet when there is weight gain.) 30 tablet 3  . metoprolol succinate (TOPROL-XL) 50 MG 24 hr tablet TAKE 1 AND 1/2 TABLETS DAILY 135 tablet 3  . potassium chloride SA (K-DUR,KLOR-CON) 20 MEQ tablet Take 1 tablet (20 mEq total) by mouth 3 (three) times a week. Takes only when Furosemide is taken (Patient taking differently: Take 20 mEq by mouth daily. Takes only when Furosemide is taken) 30 tablet 3  . predniSONE (STERAPRED UNI-PAK 21 TAB) 10 MG (21)  TBPK tablet Take by package instructions 21 tablet 0   No current facility-administered medications for this visit.      Past Surgical History:  Procedure Laterality Date  . bilateral cataract surg    . BUNIONECTOMY    . CHOLECYSTECTOMY    . COLONOSCOPY  11/16/2012   Procedure: COLONOSCOPY;  Surgeon: Rogene Houston, MD;  [facility];  Service: Endoscopy;  Laterality: N/A;  100  . LITHOTRIPSY    . ORIF PERIPROSTHETIC FRACTURE Right 10/29/2014   Procedure: OPEN REDUCTION INTERNAL FIXATION (ORIF) PERIPROSTHETIC FEMUR FRACTURE/FEMUR SHAFT;  Surgeon: Mcarthur Rossetti, MD;  Location: [facility];  Service: Orthopedics;  Laterality: Right;  . TONSILLECTOMY    . TOTAL  HIP ARTHROPLASTY     1989     Allergies  Allergen Reactions  . Erythromycin Swelling  . Levaquin [Levofloxacin] Other (See Comments)    Tongue turns red and sore  . Penicillins Swelling    Has patient had a PCN reaction causing immediate rash, facial/tongue/throat swelling, SOB or lightheadedness with hypotension: Yes Has patient had a PCN reaction causing severe rash involving mucus membranes or skin necrosis: No Has patient had a PCN reaction that required hospitalization No Has patient had a PCN reaction occurring within the last 10 years: No If all of the above answers are "NO", then may proceed with Cephalosporin use.   . Sulfa Antibiotics Other (See Comments)    Tongue turns red and sore      Family History  Problem Relation Age of Onset  . Hypertension Sister      Social History Hannah Mckee reports that she quit smoking about 20 years ago. Her smoking use included cigarettes. She has a 17.50 pack-year smoking history. she has never used smokeless tobacco. Hannah Mckee reports that she does not drink alcohol.   Review of Systems CONSTITUTIONAL: No weight loss, fever, chills, weakness or fatigue.  HEENT: Eyes: No visual loss, blurred vision, double vision or yellow sclerae.No hearing loss, sneezing, congestion, runny nose or sore throat.  SKIN: No rash or itching.  CARDIOVASCULAR: per hpi RESPIRATORY: per hpi GASTROINTESTINAL: No anorexia, nausea, vomiting or diarrhea. No abdominal pain or blood.  GENITOURINARY: No burning on urination, no polyuria NEUROLOGICAL: No headache, dizziness, syncope, paralysis, ataxia, numbness or tingling in the extremities. No change in bowel or bladder control.  MUSCULOSKELETAL: No muscle, back pain, joint pain or stiffness.  LYMPHATICS: No enlarged nodes. No history of splenectomy.  PSYCHIATRIC: No history of depression or anxiety.  ENDOCRINOLOGIC: No reports of sweating, cold or heat intolerance. No polyuria or polydipsia.   Marland Kitchen   Physical Examination Vitals:   11/24/17 1341  BP: (!) 162/68  Pulse: 97  SpO2: 97%   Vitals:   11/24/17 1341  Weight: 118 lb (53.5 kg)  Height: 4\' 7"  (1.397 m)    Gen: resting comfortably, no acute distress HEENT: no scleral icterus, pupils equal round and reactive, no palptable cervical adenopathy,  CV: RRR, no mrg, no jvd Resp: Clear to auscultation bilaterally GI: abdomen is soft, non-tender, non-distended, normal bowel sounds, no hepatosplenomegaly MSK: extremities are warm, no edema.  Skin: warm, no rash Neuro:  no focal deficits Psych: appropriate affect   Diagnostic Studies     Assessment and Plan   1. Chronic systolic HF -LVEF normalized with medical therapy - continue current meds  2. HTN - elevated in clinic, we will start norvasc 2.5mg  daily - submit bp log in 1 week.     Alphonse Guild.  Harl Bowie, M.D.

## 2017-11-24 NOTE — Patient Instructions (Signed)
Medication Instructions:  START NORVASC 2.5 MG DAILY  Labwork: NONE  Testing/Procedures: NONE  Follow-Up: Your physician recommends that you schedule a follow-up appointment in: 3 MONTHS    Any Other Special Instructions Will Be Listed Below (If Applicable). PLEASE KEEP A BLOOD PRESSURE LOG FOR 1 WEEK, THEN CALL OR DROP IT OFF AT OFFICE   If you need a refill on your cardiac medications before your next appointment, please call your pharmacy.

## 2017-11-28 DIAGNOSIS — I10 Essential (primary) hypertension: Secondary | ICD-10-CM | POA: Diagnosis not present

## 2017-11-28 DIAGNOSIS — I5032 Chronic diastolic (congestive) heart failure: Secondary | ICD-10-CM | POA: Diagnosis not present

## 2017-11-28 DIAGNOSIS — J441 Chronic obstructive pulmonary disease with (acute) exacerbation: Secondary | ICD-10-CM | POA: Diagnosis not present

## 2017-11-30 ENCOUNTER — Encounter: Payer: Self-pay | Admitting: Cardiology

## 2017-12-01 DIAGNOSIS — I5032 Chronic diastolic (congestive) heart failure: Secondary | ICD-10-CM | POA: Diagnosis not present

## 2017-12-01 DIAGNOSIS — J449 Chronic obstructive pulmonary disease, unspecified: Secondary | ICD-10-CM | POA: Diagnosis not present

## 2017-12-01 DIAGNOSIS — I10 Essential (primary) hypertension: Secondary | ICD-10-CM | POA: Diagnosis not present

## 2017-12-01 DIAGNOSIS — J9611 Chronic respiratory failure with hypoxia: Secondary | ICD-10-CM | POA: Diagnosis not present

## 2017-12-02 ENCOUNTER — Telehealth: Payer: Self-pay | Admitting: *Deleted

## 2017-12-02 NOTE — Telephone Encounter (Signed)
I think overall her bp's look ok with just some occasional mild high numbers, would not make any additional changes at this time  Zandra Abts MD

## 2017-12-02 NOTE — Telephone Encounter (Signed)
Pt calling BP readings per LOV - says she has been comliant in taking medications and starting Norvasc 2.5 mg daily. Pt has f/u appt in April    1/25 - BP    140/57 HR  87 1/26 -          150/62        92 1/27 -          174/62        97 1/28 -          134/55        100 1/29 -          140/65        96 1/30 -          136/62        81 1/31  -         130/58        76 2/1               157/61       72

## 2017-12-05 NOTE — Telephone Encounter (Signed)
Pt aware.

## 2017-12-09 ENCOUNTER — Ambulatory Visit: Payer: Medicare Other | Admitting: Cardiology

## 2017-12-12 DIAGNOSIS — M6281 Muscle weakness (generalized): Secondary | ICD-10-CM | POA: Diagnosis not present

## 2017-12-12 DIAGNOSIS — I5021 Acute systolic (congestive) heart failure: Secondary | ICD-10-CM | POA: Diagnosis not present

## 2018-01-05 ENCOUNTER — Other Ambulatory Visit: Payer: Self-pay | Admitting: *Deleted

## 2018-01-05 MED ORDER — AMLODIPINE BESYLATE 2.5 MG PO TABS: 2.5000 mg | ORAL_TABLET | Freq: Every day | ORAL | 1 refills | Status: DC

## 2018-01-09 DIAGNOSIS — M6281 Muscle weakness (generalized): Secondary | ICD-10-CM | POA: Diagnosis not present

## 2018-01-09 DIAGNOSIS — I5021 Acute systolic (congestive) heart failure: Secondary | ICD-10-CM | POA: Diagnosis not present

## 2018-01-26 DIAGNOSIS — E785 Hyperlipidemia, unspecified: Secondary | ICD-10-CM | POA: Diagnosis not present

## 2018-01-26 DIAGNOSIS — J449 Chronic obstructive pulmonary disease, unspecified: Secondary | ICD-10-CM | POA: Diagnosis not present

## 2018-01-26 DIAGNOSIS — I10 Essential (primary) hypertension: Secondary | ICD-10-CM | POA: Diagnosis not present

## 2018-01-26 DIAGNOSIS — M545 Low back pain: Secondary | ICD-10-CM | POA: Diagnosis not present

## 2018-02-03 DIAGNOSIS — J449 Chronic obstructive pulmonary disease, unspecified: Secondary | ICD-10-CM | POA: Diagnosis not present

## 2018-02-03 DIAGNOSIS — E785 Hyperlipidemia, unspecified: Secondary | ICD-10-CM | POA: Diagnosis not present

## 2018-02-03 DIAGNOSIS — I110 Hypertensive heart disease with heart failure: Secondary | ICD-10-CM | POA: Diagnosis not present

## 2018-02-03 DIAGNOSIS — I5032 Chronic diastolic (congestive) heart failure: Secondary | ICD-10-CM | POA: Diagnosis not present

## 2018-02-07 ENCOUNTER — Encounter: Payer: Self-pay | Admitting: *Deleted

## 2018-02-07 ENCOUNTER — Encounter: Payer: Self-pay | Admitting: Cardiology

## 2018-02-07 ENCOUNTER — Ambulatory Visit: Payer: Medicare Other | Admitting: Cardiology

## 2018-02-07 VITALS — BP 142/60 | HR 91 | Ht <= 58 in | Wt 120.4 lb

## 2018-02-07 DIAGNOSIS — I5022 Chronic systolic (congestive) heart failure: Secondary | ICD-10-CM

## 2018-02-07 DIAGNOSIS — I10 Essential (primary) hypertension: Secondary | ICD-10-CM

## 2018-02-07 MED ORDER — METOPROLOL SUCCINATE ER 50 MG PO TB24: ORAL_TABLET | ORAL | 1 refills | Status: DC

## 2018-02-07 NOTE — Patient Instructions (Signed)
Your physician recommends that you schedule a follow-up appointment in: [place] has recommended you make the following change in your medication:   CHANGE TOPROL XL 75 MG (1 AND 1/2 TABLETS) DAILY   Thank you for choosing [facility]!!

## 2018-02-07 NOTE — Progress Notes (Signed)
Clinical Summary Ms. Gloster is a 84 y.o.female seen today for follow up of the following medical problems.    1. Chronic systolic HF - echo 15/4008 LVEF 35-40%. Family asked to defef ischemic testing at that time.  - f/u echo after medical therapy showed normalization of LVEF. 04/2017 echo LVEF 67-61%, grade I diastolic dysfunction. Mild AI. - lisionpril caused rash.    - still with ongoing SOB. Some swelling of right leg.  - SOB better with proair.  - home weights stable 120.  - limiting fluid intake.    2. COPD - followed by pcp  3. HTN - started norvasc 2.5mg  daily last visit. Submitted bp log with reasonable bp control      Past Medical History:  Diagnosis Date  . CHF (congestive heart failure) ([place])    a. EF 35-40% by echo in 10/2016 b. improved to 55-60% by echo in 04/2017  . COPD (chronic obstructive pulmonary disease) ([facility])    no home O2  . Hypertension   . Kidney stones   . Osteoarthritis   . Salivary gland stone   . Sepsis ([place])      Allergies  Allergen Reactions  . Erythromycin Swelling  . Levaquin [Levofloxacin] Other (See Comments)    Tongue turns red and sore  . Penicillins Swelling    Has patient had a PCN reaction causing immediate rash, facial/tongue/throat swelling, SOB or lightheadedness with hypotension: Yes Has patient had a PCN reaction causing severe rash involving mucus membranes or skin necrosis: No Has patient had a PCN reaction that required hospitalization No Has patient had a PCN reaction occurring within the last 10 years: No If all of the above answers are "NO", then may proceed with Cephalosporin use.   . Sulfa Antibiotics Other (See Comments)    Tongue turns red and sore     Current Outpatient Medications  Medication Sig Dispense Refill  . amLODipine (NORVASC) 2.5 MG tablet Take 1 tablet (2.5 mg total) by mouth daily. 90 tablet 1  . ANORO ELLIPTA 62.5-25 MCG/INH AEPB Inhale 1 puff into the lungs daily.    Marland Kitchen  aspirin EC 81 MG tablet Take 81 mg by mouth daily.    . clonazePAM (KLONOPIN) 0.5 MG tablet Take 0.125 mg by mouth daily as needed for anxiety.     . feeding supplement, ENSURE ENLIVE, (ENSURE ENLIVE) LIQD Take 237 mLs by mouth 2 (two) times daily between meals. (Patient taking differently: Take 237 mLs by mouth daily. ) 237 mL 12  . furosemide (LASIX) 40 MG tablet Take 0.5 tablets (20 mg total) by mouth daily. (Patient taking differently: Take 20 mg by mouth daily. Take 40 mg tablet when there is weight gain.) 30 tablet 3  . metoprolol succinate (TOPROL-XL) 50 MG 24 hr tablet TAKE 1 AND 1/2 TABLETS DAILY 135 tablet 3  . potassium chloride SA (K-DUR,KLOR-CON) 20 MEQ tablet Take 1 tablet (20 mEq total) by mouth 3 (three) times a week. Takes only when Furosemide is taken (Patient taking differently: Take 20 mEq by mouth daily. Takes only when Furosemide is taken) 30 tablet 3   No current facility-administered medications for this visit.      Past Surgical History:  Procedure Laterality Date  . bilateral cataract surg    . BUNIONECTOMY    . CHOLECYSTECTOMY    . COLONOSCOPY  11/16/2012   Procedure: COLONOSCOPY;  Surgeon: Rogene Houston, MD;  [facility];  Service: Endoscopy;  Laterality: N/A;  100  . LITHOTRIPSY    . ORIF PERIPROSTHETIC FRACTURE Right 10/29/2014   Procedure: OPEN REDUCTION INTERNAL FIXATION (ORIF) PERIPROSTHETIC FEMUR FRACTURE/FEMUR SHAFT;  Surgeon: Mcarthur Rossetti, MD;  Location: [facility];  Service: Orthopedics;  Laterality: Right;  . TONSILLECTOMY    . TOTAL HIP ARTHROPLASTY     1989     Allergies  Allergen Reactions  . Erythromycin Swelling  . Levaquin [Levofloxacin] Other (See Comments)    Tongue turns red and sore  . Penicillins Swelling    Has patient had a PCN reaction causing immediate rash, facial/tongue/throat swelling, SOB or lightheadedness with hypotension: Yes Has patient had a PCN reaction causing severe rash involving mucus membranes or  skin necrosis: No Has patient had a PCN reaction that required hospitalization No Has patient had a PCN reaction occurring within the last 10 years: No If all of the above answers are "NO", then may proceed with Cephalosporin use.   . Sulfa Antibiotics Other (See Comments)    Tongue turns red and sore      Family History  Problem Relation Age of Onset  . Hypertension Sister      Social History Ms. Aydelotte reports that she quit smoking about 21 years ago. Her smoking use included cigarettes. She has a 17.50 pack-year smoking history. She has never used smokeless tobacco. Ms. Putman reports that she does not drink alcohol.   Review of Systems CONSTITUTIONAL: No weight loss, fever, chills, weakness or fatigue.  HEENT: Eyes: No visual loss, blurred vision, double vision or yellow sclerae.No hearing loss, sneezing, congestion, runny nose or sore throat.  SKIN: No rash or itching.  CARDIOVASCULAR: per hpi RESPIRATORY: No shortness of breath, cough or sputum.  GASTROINTESTINAL: No anorexia, nausea, vomiting or diarrhea. No abdominal pain or blood.  GENITOURINARY: No burning on urination, no polyuria NEUROLOGICAL: No headache, dizziness, syncope, paralysis, ataxia, numbness or tingling in the extremities. No change in bowel or bladder control.  MUSCULOSKELETAL: No muscle, back pain, joint pain or stiffness.  LYMPHATICS: No enlarged nodes. No history of splenectomy.  PSYCHIATRIC: No history of depression or anxiety.  ENDOCRINOLOGIC: No reports of sweating, cold or heat intolerance. No polyuria or polydipsia.  Marland Kitchen   Physical Examination Vitals:   02/07/18 1058  BP: (!) 142/60  Pulse: 91  SpO2: 97%   Vitals:   02/07/18 1058  Weight: 120 lb 6.4 oz (54.6 kg)  Height: 4\' 7"  (1.397 m)    Gen: resting comfortably, no acute distress HEENT: no scleral icterus, pupils equal round and reactive, no palptable cervical adenopathy,  CV: RRR, no m/r/g, no jvd Resp: Clear to auscultation  bilaterally GI: abdomen is soft, non-tender, non-distended, normal bowel sounds, no hepatosplenomegaly MSK: extremities are warm, no edema.  Skin: warm, no rash Neuro:  no focal deficits Psych: appropriate affect      Assessment and Plan   1.Chronic systolic HF -LVEF normalized with medical therapy - recent SOB improved with her prn alburol support symptoms are related to her COPD and not CHF - continue current meds  2. HTN -reasonable control given her age, continue current meds   F/u 4 months   Arnoldo Lenis, M.D

## 2018-02-09 DIAGNOSIS — I5021 Acute systolic (congestive) heart failure: Secondary | ICD-10-CM | POA: Diagnosis not present

## 2018-02-09 DIAGNOSIS — M6281 Muscle weakness (generalized): Secondary | ICD-10-CM | POA: Diagnosis not present

## 2018-02-11 ENCOUNTER — Encounter: Payer: Self-pay | Admitting: Cardiology

## 2018-03-11 DIAGNOSIS — M6281 Muscle weakness (generalized): Secondary | ICD-10-CM | POA: Diagnosis not present

## 2018-03-11 DIAGNOSIS — I5021 Acute systolic (congestive) heart failure: Secondary | ICD-10-CM | POA: Diagnosis not present

## 2018-03-23 DIAGNOSIS — H524 Presbyopia: Secondary | ICD-10-CM | POA: Diagnosis not present

## 2018-03-23 DIAGNOSIS — H04121 Dry eye syndrome of right lacrimal gland: Secondary | ICD-10-CM | POA: Diagnosis not present

## 2018-04-06 DIAGNOSIS — I10 Essential (primary) hypertension: Secondary | ICD-10-CM | POA: Diagnosis not present

## 2018-04-06 DIAGNOSIS — J449 Chronic obstructive pulmonary disease, unspecified: Secondary | ICD-10-CM | POA: Diagnosis not present

## 2018-04-06 DIAGNOSIS — I5032 Chronic diastolic (congestive) heart failure: Secondary | ICD-10-CM | POA: Diagnosis not present

## 2018-04-06 DIAGNOSIS — J9611 Chronic respiratory failure with hypoxia: Secondary | ICD-10-CM | POA: Diagnosis not present

## 2018-04-11 DIAGNOSIS — I5021 Acute systolic (congestive) heart failure: Secondary | ICD-10-CM | POA: Diagnosis not present

## 2018-04-11 DIAGNOSIS — M6281 Muscle weakness (generalized): Secondary | ICD-10-CM | POA: Diagnosis not present

## 2018-05-11 DIAGNOSIS — I5021 Acute systolic (congestive) heart failure: Secondary | ICD-10-CM | POA: Diagnosis not present

## 2018-05-11 DIAGNOSIS — M6281 Muscle weakness (generalized): Secondary | ICD-10-CM | POA: Diagnosis not present

## 2018-05-18 DIAGNOSIS — J9611 Chronic respiratory failure with hypoxia: Secondary | ICD-10-CM | POA: Diagnosis not present

## 2018-05-18 DIAGNOSIS — J449 Chronic obstructive pulmonary disease, unspecified: Secondary | ICD-10-CM | POA: Diagnosis not present

## 2018-05-18 DIAGNOSIS — I5032 Chronic diastolic (congestive) heart failure: Secondary | ICD-10-CM | POA: Diagnosis not present

## 2018-05-18 DIAGNOSIS — I10 Essential (primary) hypertension: Secondary | ICD-10-CM | POA: Diagnosis not present

## 2018-06-06 ENCOUNTER — Other Ambulatory Visit: Payer: Self-pay | Admitting: Cardiology

## 2018-06-11 DIAGNOSIS — I5021 Acute systolic (congestive) heart failure: Secondary | ICD-10-CM | POA: Diagnosis not present

## 2018-06-11 DIAGNOSIS — M6281 Muscle weakness (generalized): Secondary | ICD-10-CM | POA: Diagnosis not present

## 2018-06-12 ENCOUNTER — Ambulatory Visit: Payer: Medicare Other | Admitting: Cardiology

## 2018-06-12 ENCOUNTER — Other Ambulatory Visit: Payer: Self-pay

## 2018-06-12 ENCOUNTER — Encounter: Payer: Self-pay | Admitting: Cardiology

## 2018-06-12 VITALS — BP 164/66 | HR 85 | Ht <= 58 in | Wt 121.0 lb

## 2018-06-12 DIAGNOSIS — I10 Essential (primary) hypertension: Secondary | ICD-10-CM

## 2018-06-12 DIAGNOSIS — I5022 Chronic systolic (congestive) heart failure: Secondary | ICD-10-CM | POA: Diagnosis not present

## 2018-06-12 MED ORDER — FUROSEMIDE 40 MG PO TABS: 40.0000 mg | ORAL_TABLET | Freq: Every day | ORAL | 6 refills | Status: DC

## 2018-06-12 NOTE — Progress Notes (Signed)
Clinical Summary Hannah Mckee is a 84 y.o.female seen today for follow up of the following medical problems.    1. Chronic systolic HF - echo 33/8250 LVEF 35-40%. Family asked to defer ischemic testing at that time. -f/u echo after medical therapy showed normalization of LVEF.04/2017 echo LVEF 53-97%, grade I diastolic dysfunction. Mild AI. - lisionpril caused rash.  - no recent SOB/DOE - some LE edema at times, lasix at current dose does not seem to control.     2. COPD - followed by Dr Luan Pulling   3. HTN - compliant with meds    SH: upcoming trip to [facility] Past Medical History:  Diagnosis Date  . CHF (congestive heart failure) ([place])    a. EF 35-40% by echo in 10/2016 b. improved to 55-60% by echo in 04/2017  . COPD (chronic obstructive pulmonary disease) ([facility])    no home O2  . Hypertension   . Kidney stones   . Osteoarthritis   . Salivary gland stone   . Sepsis ([place])      Allergies  Allergen Reactions  . Erythromycin Swelling  . Levaquin [Levofloxacin] Other (See Comments)    Tongue turns red and sore  . Penicillins Swelling    Has patient had a PCN reaction causing immediate rash, facial/tongue/throat swelling, SOB or lightheadedness with hypotension: Yes Has patient had a PCN reaction causing severe rash involving mucus membranes or skin necrosis: No Has patient had a PCN reaction that required hospitalization No Has patient had a PCN reaction occurring within the last 10 years: No If all of the above answers are "NO", then may proceed with Cephalosporin use.   . Sulfa Antibiotics Other (See Comments)    Tongue turns red and sore     Current Outpatient Medications  Medication Sig Dispense Refill  . amLODipine (NORVASC) 2.5 MG tablet Take 1 tablet (2.5 mg total) by mouth daily. 90 tablet 1  . ANORO ELLIPTA 62.5-25 MCG/INH AEPB Inhale 1 puff into the lungs daily.    Marland Kitchen aspirin EC 81 MG tablet Take 81 mg by mouth daily.    . clonazePAM  (KLONOPIN) 0.5 MG tablet Take 0.125 mg by mouth daily as needed for anxiety.     . feeding supplement, ENSURE ENLIVE, (ENSURE ENLIVE) LIQD Take 237 mLs by mouth 2 (two) times daily between meals. (Patient taking differently: Take 237 mLs by mouth daily. ) 237 mL 12  . furosemide (LASIX) 40 MG tablet Take 0.5 tablets (20 mg total) by mouth daily. (Patient taking differently: Take 20 mg by mouth daily. Take 40 mg tablet when there is weight gain.) 30 tablet 3  . metoprolol succinate (TOPROL-XL) 50 MG 24 hr tablet TAKE 1 AND 1/2 TABLETS DAILY 135 tablet 1  . potassium chloride SA (K-DUR,KLOR-CON) 20 MEQ tablet Take 1 tablet (20 mEq total) by mouth 3 (three) times a week. Takes only when Furosemide is taken (Patient taking differently: Take 20 mEq by mouth daily. Takes only when Furosemide is taken) 30 tablet 3   No current facility-administered medications for this visit.      Past Surgical History:  Procedure Laterality Date  . bilateral cataract surg    . BUNIONECTOMY    . CHOLECYSTECTOMY    . COLONOSCOPY  11/16/2012   Procedure: COLONOSCOPY;  Surgeon: Rogene Houston, MD;  [facility];  Service: Endoscopy;  Laterality: N/A;  100  . LITHOTRIPSY    . ORIF PERIPROSTHETIC FRACTURE Right 10/29/2014   Procedure:  OPEN REDUCTION INTERNAL FIXATION (ORIF) PERIPROSTHETIC FEMUR FRACTURE/FEMUR SHAFT;  Surgeon: Mcarthur Rossetti, MD;  Location: [place];  Service: Orthopedics;  Laterality: Right;  . TONSILLECTOMY    . TOTAL HIP ARTHROPLASTY     1989     Allergies  Allergen Reactions  . Erythromycin Swelling  . Levaquin [Levofloxacin] Other (See Comments)    Tongue turns red and sore  . Penicillins Swelling    Has patient had a PCN reaction causing immediate rash, facial/tongue/throat swelling, SOB or lightheadedness with hypotension: Yes Has patient had a PCN reaction causing severe rash involving mucus membranes or skin necrosis: No Has patient had a PCN reaction that required  hospitalization No Has patient had a PCN reaction occurring within the last 10 years: No If all of the above answers are "NO", then may proceed with Cephalosporin use.   . Sulfa Antibiotics Other (See Comments)    Tongue turns red and sore      Family History  Problem Relation Age of Onset  . Hypertension Sister      Social History Hannah Mckee reports that she quit smoking about 21 years ago. Her smoking use included cigarettes. She has a 17.50 pack-year smoking history. She has never used smokeless tobacco. Hannah Mckee reports that she does not drink alcohol.   Review of Systems CONSTITUTIONAL: No weight loss, fever, chills, weakness or fatigue.  HEENT: Eyes: No visual loss, blurred vision, double vision or yellow sclerae.No hearing loss, sneezing, congestion, runny nose or sore throat.  SKIN: No rash or itching.  CARDIOVASCULAR: per hpi RESPIRATORY: No shortness of breath, cough or sputum.  GASTROINTESTINAL: No anorexia, nausea, vomiting or diarrhea. No abdominal pain or blood.  GENITOURINARY: No burning on urination, no polyuria NEUROLOGICAL: No headache, dizziness, syncope, paralysis, ataxia, numbness or tingling in the extremities. No change in bowel or bladder control.  MUSCULOSKELETAL: No muscle, back pain, joint pain or stiffness.  LYMPHATICS: No enlarged nodes. No history of splenectomy.  PSYCHIATRIC: No history of depression or anxiety.  ENDOCRINOLOGIC: No reports of sweating, cold or heat intolerance. No polyuria or polydipsia.  Marland Kitchen   Physical Examination Vitals:   06/12/18 1007  BP: (!) 164/66  Pulse: 85  SpO2: 97%   Vitals:   06/12/18 1007  Weight: 121 lb (54.9 kg)  Height: 4\' 7"  (1.397 m)    Gen: resting comfortably, no acute distress HEENT: no scleral icterus, pupils equal round and reactive, no palptable cervical adenopathy,  CV: RRR, no m/rg, no jvd Resp: Clear to auscultation bilaterally GI: abdomen is soft, non-tender, non-distended, normal bowel  sounds, no hepatosplenomegaly MSK: extremities are warm, 1+ bilateral LE edema Skin: warm, no rash Neuro:  no focal deficits Psych: appropriate affect     Assessment and Plan   1.Chronic systolic HF -LVEF normalized with medical therapy -no recent SOB/DOE. Some LE edema, we will change her lasix to 40mg  daily, may take a total of 60mg  as needed for severe swelling. Recheck BMET/Mg in 2 weeks.   2. HTN -repeat manual check 136/65, continue current meds  EKG today shows normal sinus rhythm   F/u 6 months     Arnoldo Lenis, M.D.

## 2018-06-12 NOTE — Patient Instructions (Signed)
Medication Instructions:   Increase Lasix to 40mg  daily - may take an extra 1/2 tab as needed for severe swelling.  Continue all other medications.    Labwork:  BMET, Magnesium - orders given today.   Do in 2 weeks.   Office will contact with results via phone or letter.    Testing/Procedures: none  Follow-Up: Your physician wants you to follow up in: 6 months.  You will receive a reminder letter in the mail one-two months in advance.  If you don't receive a letter, please call our office to schedule the follow up appointment   Any Other Special Instructions Will Be Listed Below (If Applicable).  If you need a refill on your cardiac medications before your next appointment, please call your pharmacy.

## 2018-06-16 ENCOUNTER — Telehealth: Payer: Self-pay | Admitting: Cardiology

## 2018-06-16 NOTE — Telephone Encounter (Signed)
Daughter called stating that she wants to know if Dr. Harl Bowie will order a blood test for shingles. States that she has the symptoms of shingles. They called PCP (Dr. Luan Pulling) but have not heard from his office. [phone].

## 2018-06-16 NOTE — Telephone Encounter (Signed)
Daughter made aware that this would have to come from PCP and Dr. Harl Bowie is not in the office this afternoon.

## 2018-06-19 ENCOUNTER — Telehealth: Payer: Self-pay | Admitting: Cardiology

## 2018-06-19 DIAGNOSIS — J449 Chronic obstructive pulmonary disease, unspecified: Secondary | ICD-10-CM | POA: Diagnosis not present

## 2018-06-19 DIAGNOSIS — B029 Zoster without complications: Secondary | ICD-10-CM | POA: Diagnosis not present

## 2018-06-19 DIAGNOSIS — I5032 Chronic diastolic (congestive) heart failure: Secondary | ICD-10-CM | POA: Diagnosis not present

## 2018-06-19 DIAGNOSIS — I10 Essential (primary) hypertension: Secondary | ICD-10-CM | POA: Diagnosis not present

## 2018-06-19 NOTE — Telephone Encounter (Signed)
Daughter called wondering if Dr Harl Bowie  would order something for her to be tested for shingles.  Per daughter she has every symptom but rash

## 2018-06-19 NOTE — Telephone Encounter (Signed)
Advised patients daughter the same thing that was advised on Friday they would need to contact PCP regarding this

## 2018-06-27 DIAGNOSIS — I10 Essential (primary) hypertension: Secondary | ICD-10-CM | POA: Diagnosis not present

## 2018-06-27 DIAGNOSIS — J449 Chronic obstructive pulmonary disease, unspecified: Secondary | ICD-10-CM | POA: Diagnosis not present

## 2018-06-27 DIAGNOSIS — I5032 Chronic diastolic (congestive) heart failure: Secondary | ICD-10-CM | POA: Diagnosis not present

## 2018-06-27 DIAGNOSIS — B029 Zoster without complications: Secondary | ICD-10-CM | POA: Diagnosis not present

## 2018-06-29 DIAGNOSIS — I5022 Chronic systolic (congestive) heart failure: Secondary | ICD-10-CM | POA: Diagnosis not present

## 2018-06-29 DIAGNOSIS — I110 Hypertensive heart disease with heart failure: Secondary | ICD-10-CM | POA: Diagnosis not present

## 2018-07-03 ENCOUNTER — Other Ambulatory Visit: Payer: Self-pay | Admitting: Cardiology

## 2018-07-12 DIAGNOSIS — M6281 Muscle weakness (generalized): Secondary | ICD-10-CM | POA: Diagnosis not present

## 2018-07-12 DIAGNOSIS — I5021 Acute systolic (congestive) heart failure: Secondary | ICD-10-CM | POA: Diagnosis not present

## 2018-07-27 DIAGNOSIS — I5032 Chronic diastolic (congestive) heart failure: Secondary | ICD-10-CM | POA: Diagnosis not present

## 2018-07-27 DIAGNOSIS — R5383 Other fatigue: Secondary | ICD-10-CM | POA: Diagnosis not present

## 2018-07-27 DIAGNOSIS — B029 Zoster without complications: Secondary | ICD-10-CM | POA: Diagnosis not present

## 2018-07-27 DIAGNOSIS — J449 Chronic obstructive pulmonary disease, unspecified: Secondary | ICD-10-CM | POA: Diagnosis not present

## 2018-08-11 DIAGNOSIS — M6281 Muscle weakness (generalized): Secondary | ICD-10-CM | POA: Diagnosis not present

## 2018-08-11 DIAGNOSIS — I5021 Acute systolic (congestive) heart failure: Secondary | ICD-10-CM | POA: Diagnosis not present

## 2018-09-06 ENCOUNTER — Other Ambulatory Visit: Payer: Self-pay

## 2018-09-06 ENCOUNTER — Emergency Department (HOSPITAL_COMMUNITY): Payer: Medicare Other

## 2018-09-06 ENCOUNTER — Observation Stay (HOSPITAL_COMMUNITY)
Admission: EM | Admit: 2018-09-06 | Discharge: 2018-09-07 | Disposition: A | Discharge status: Home / Self Care | Payer: Medicare Other | Attending: Internal Medicine | Admitting: Internal Medicine

## 2018-09-06 ENCOUNTER — Encounter (HOSPITAL_COMMUNITY): Payer: Self-pay | Admitting: Emergency Medicine

## 2018-09-06 DIAGNOSIS — I110 Hypertensive heart disease with heart failure: Secondary | ICD-10-CM | POA: Diagnosis not present

## 2018-09-06 DIAGNOSIS — Z79899 Other long term (current) drug therapy: Secondary | ICD-10-CM | POA: Diagnosis not present

## 2018-09-06 DIAGNOSIS — I5032 Chronic diastolic (congestive) heart failure: Secondary | ICD-10-CM | POA: Insufficient documentation

## 2018-09-06 DIAGNOSIS — I10 Essential (primary) hypertension: Secondary | ICD-10-CM | POA: Diagnosis present

## 2018-09-06 DIAGNOSIS — J9611 Chronic respiratory failure with hypoxia: Secondary | ICD-10-CM

## 2018-09-06 DIAGNOSIS — R0602 Shortness of breath: Secondary | ICD-10-CM | POA: Insufficient documentation

## 2018-09-06 DIAGNOSIS — I5022 Chronic systolic (congestive) heart failure: Secondary | ICD-10-CM

## 2018-09-06 DIAGNOSIS — J449 Chronic obstructive pulmonary disease, unspecified: Secondary | ICD-10-CM | POA: Diagnosis present

## 2018-09-06 DIAGNOSIS — Z87891 Personal history of nicotine dependence: Secondary | ICD-10-CM | POA: Insufficient documentation

## 2018-09-06 DIAGNOSIS — R079 Chest pain, unspecified: Secondary | ICD-10-CM | POA: Diagnosis present

## 2018-09-06 DIAGNOSIS — Z7982 Long term (current) use of aspirin: Secondary | ICD-10-CM | POA: Diagnosis not present

## 2018-09-06 DIAGNOSIS — I200 Unstable angina: Secondary | ICD-10-CM | POA: Diagnosis not present

## 2018-09-06 DIAGNOSIS — E876 Hypokalemia: Secondary | ICD-10-CM | POA: Diagnosis present

## 2018-09-06 HISTORY — DX: Personal history of other diseases of the circulatory system: Z86.79

## 2018-09-06 HISTORY — DX: Atherosclerotic heart disease of native coronary artery without angina pectoris: I25.10

## 2018-09-06 HISTORY — DX: Personal history of other infectious and parasitic diseases: Z86.19

## 2018-09-06 LAB — CBC WITH DIFFERENTIAL/PLATELET
Abs Immature Granulocytes: 0.03 10*3/uL (ref 0.00–0.07)
Basophils Absolute: 0.1 10*3/uL (ref 0.0–0.1)
Basophils Relative: 1 %
EOS ABS: 0.6 10*3/uL — AB (ref 0.0–0.5)
EOS PCT: 5 %
HEMATOCRIT: 38.7 % (ref 36.0–46.0)
HEMOGLOBIN: 12 g/dL (ref 12.0–15.0)
Immature Granulocytes: 0 %
LYMPHS PCT: 13 %
Lymphs Abs: 1.5 10*3/uL (ref 0.7–4.0)
MCH: 28.1 pg (ref 26.0–34.0)
MCHC: 31 g/dL (ref 30.0–36.0)
MCV: 90.6 fL (ref 80.0–100.0)
MONO ABS: 0.9 10*3/uL (ref 0.1–1.0)
Monocytes Relative: 8 %
NRBC: 0 % (ref 0.0–0.2)
Neutro Abs: 8.8 10*3/uL — ABNORMAL HIGH (ref 1.7–7.7)
Neutrophils Relative %: 73 %
Platelets: 247 10*3/uL (ref 150–400)
RBC: 4.27 MIL/uL (ref 3.87–5.11)
RDW: 14.2 % (ref 11.5–15.5)
WBC: 11.9 10*3/uL — AB (ref 4.0–10.5)

## 2018-09-06 LAB — COMPREHENSIVE METABOLIC PANEL
ALK PHOS: 96 U/L (ref 38–126)
ALT: 13 U/L (ref 0–44)
AST: 19 U/L (ref 15–41)
Albumin: 3.7 g/dL (ref 3.5–5.0)
Anion gap: 11 (ref 5–15)
BUN: 13 mg/dL (ref 8–23)
CO2: 26 mmol/L (ref 22–32)
Calcium: 9.1 mg/dL (ref 8.9–10.3)
Chloride: 99 mmol/L (ref 98–111)
Creatinine, Ser: 0.75 mg/dL (ref 0.44–1.00)
GFR calc Af Amer: >60 mL/min (ref >60)
GFR calc non Af Amer: >60 mL/min (ref >60)
GLUCOSE: 112 mg/dL — AB (ref 70–99)
POTASSIUM: 2.9 mmol/L — AB (ref 3.5–5.1)
SODIUM: 136 mmol/L (ref 135–145)
TOTAL PROTEIN: 7.4 g/dL (ref 6.5–8.1)
Total Bilirubin: 0.6 mg/dL (ref 0.3–1.2)

## 2018-09-06 LAB — PHOSPHORUS: Phosphorus: 3.5 mg/dL (ref 2.5–4.6)

## 2018-09-06 LAB — BRAIN NATRIURETIC PEPTIDE: B NATRIURETIC PEPTIDE 5: 202 pg/mL — AB (ref 0.0–100.0)

## 2018-09-06 LAB — MAGNESIUM: MAGNESIUM: 2 mg/dL (ref 1.7–2.4)

## 2018-09-06 LAB — I-STAT TROPONIN, ED: Troponin i, poc: 0 ng/mL (ref 0.00–0.08)

## 2018-09-06 MED ORDER — SODIUM CHLORIDE 0.9 % IV SOLN
INTRAVENOUS | Status: DC | PRN
  Administered 2018-09-06: 500 mL via INTRAVENOUS

## 2018-09-06 MED ORDER — MAGNESIUM SULFATE 2 GM/50ML IV SOLN
2.0000 g | Freq: Once | INTRAVENOUS | Status: AC
  Administered 2018-09-06: 2 g via INTRAVENOUS
  Filled 2018-09-06: qty 50

## 2018-09-06 MED ORDER — NITROGLYCERIN 0.4 MG SL SUBL
0.4000 mg | SUBLINGUAL_TABLET | SUBLINGUAL | Status: DC | PRN
  Administered 2018-09-06: 0.4 mg via SUBLINGUAL
  Filled 2018-09-06: qty 1

## 2018-09-06 MED ORDER — HEPARIN SODIUM (PORCINE) 5000 UNIT/ML IJ SOLN: 5000.0000 [IU] | Freq: Three times a day (TID) | INTRAMUSCULAR | Status: DC

## 2018-09-06 MED ORDER — MORPHINE SULFATE (PF) 2 MG/ML IV SOLN
2.0000 mg | Freq: Once | INTRAVENOUS | Status: DC
  Filled 2018-09-06: qty 1

## 2018-09-06 MED ORDER — ACETAMINOPHEN 325 MG PO TABS
650.0000 mg | ORAL_TABLET | ORAL | Status: DC | PRN
  Filled 2018-09-06: qty 2

## 2018-09-06 MED ORDER — POTASSIUM CHLORIDE CRYS ER 20 MEQ PO TBCR
40.0000 meq | EXTENDED_RELEASE_TABLET | Freq: Once | ORAL | Status: AC
  Administered 2018-09-06: 40 meq via ORAL
  Filled 2018-09-06: qty 2

## 2018-09-06 MED ORDER — HEPARIN SODIUM (PORCINE) 5000 UNIT/ML IJ SOLN
5000.0000 [IU] | Freq: Three times a day (TID) | INTRAMUSCULAR | Status: DC
  Administered 2018-09-06: 5000 [IU] via SUBCUTANEOUS
  Filled 2018-09-06: qty 1

## 2018-09-06 MED ORDER — ONDANSETRON HCL 4 MG/2ML IJ SOLN: 4.0000 mg | Freq: Four times a day (QID) | INTRAMUSCULAR | Status: DC | PRN

## 2018-09-06 NOTE — ED Triage Notes (Signed)
Pt with R sided jaw pain that started today as well as R sided chest pain. Pt c/o SOB and of "feeling funny from my nose to top of my head". Pt took 4 baby ASA at home.

## 2018-09-06 NOTE — ED Provider Notes (Signed)
[facility] EMERGENCY DEPARTMENT Provider Note   CSN: [id number] Arrival date & time: 09/06/18  2031     History   Chief Complaint Chief Complaint  Patient presents with  . Chest Pain    HPI Hannah Mckee is a 84 y.o. female.  Patient complains of chest pain that radiates up into her right jaw for the last 12 hours.  Some shortness of breath  The history is provided by the patient. No language interpreter was used.  Chest Pain   This is a new problem. The current episode started more than 1 week ago. The problem occurs constantly. The problem has not changed since onset.The pain is associated with exertion. The pain is present in the substernal region. The pain is at a severity of 6/10. The pain is moderate. The quality of the pain is described as dull. The pain radiates to the right neck. The symptoms are aggravated by exertion. Pertinent negatives include no abdominal pain, no back pain, no cough and no headaches.  Pertinent negatives for past medical history include no seizures.    Past Medical History:  Diagnosis Date  . CHF (congestive heart failure) ([place])    a. EF 35-40% by echo in 10/2016 b. improved to 55-60% by echo in 04/2017  . COPD (chronic obstructive pulmonary disease) (H[facility])    no home O2  . History of shingles   . Hypertension   . Kidney stones   . Osteoarthritis   . Salivary gland stone   . Sepsis [facility])     Patient Active Problem List   Diagnosis Date Noted  . Chest pain 06/25/2017  . Chronic diastolic CHF (congestive heart failure) ([place]) 05/23/2017  . COPD exacerbation ([place]) 10/11/2016  . Muscle weakness (generalized)   . Sinus tachycardia   . Lobar pneumonia, unspecified organism ([place])   . CAP (community acquired pneumonia) 10/04/2016  . Sepsis ([place]) 10/04/2016  . Acute on chronic respiratory failure with hypoxia ([place]) 10/04/2016  . Elevated troponin 10/04/2016  . Hypokalemia 10/04/2016  . Moderate malnutrition ([place]) 10/04/2016  . Right  femoral fracture ([place]) 10/28/2014  . COPD (chronic obstructive pulmonary disease) ([place])   . Fall   . Unspecified constipation 04/16/2013  . Diverticulitis of colon without hemorrhage 04/16/2013  . Rectal bleed 11/06/2012  . Hypertension 11/06/2012    Past Surgical History:  Procedure Laterality Date  . bilateral cataract surg    . BUNIONECTOMY    . CHOLECYSTECTOMY    . COLONOSCOPY  11/16/2012   Procedure: COLONOSCOPY;  Surgeon: Rogene Houston, MD;  [facility];  Service: Endoscopy;  Laterality: N/A;  100  . LITHOTRIPSY    . ORIF PERIPROSTHETIC FRACTURE Right 10/29/2014   Procedure: OPEN REDUCTION INTERNAL FIXATION (ORIF) PERIPROSTHETIC FEMUR FRACTURE/FEMUR SHAFT;  Surgeon: Mcarthur Rossetti, MD;  Location: [place];  Service: Orthopedics;  Laterality: Right;  . TONSILLECTOMY    . TOTAL HIP ARTHROPLASTY     1989     OB History    Gravida      Para      Term      Preterm      AB      Living  4     SAB      TAB      Ectopic      Multiple      Live Births               Home Medications    Prior to Admission  medications   Medication Sig Start Date End Date Taking? Authorizing Provider  amLODipine (NORVASC) 2.5 MG tablet TAKE 1 TABLET BY MOUTH  DAILY Patient taking differently: Take 2.5 mg by mouth daily.  07/04/18  Yes Branch, Alphonse Guild, MD  ANORO ELLIPTA 62.5-25 MCG/INH AEPB Inhale 1 puff into the lungs at bedtime.  02/17/17  Yes [provider]  aspirin EC 81 MG tablet Take 81 mg by mouth daily.   Yes [provider]  clonazePAM (KLONOPIN) 0.5 MG tablet Take 0.125 mg by mouth daily as needed for anxiety.    Yes [provider]  feeding supplement, ENSURE ENLIVE, (ENSURE ENLIVE) LIQD Take 237 mLs by mouth 2 (two) times daily between meals. Patient taking differently: Take 237 mLs by mouth daily.  10/11/16  Yes Dhungel, Nishant, MD  furosemide (LASIX) 40 MG tablet Take 1 tablet (40 mg total) by mouth daily. (May take  an extra 1/2 tab as needed for severe swelling.) 06/12/18  Yes Branch, Alphonse Guild, MD  gabapentin (NEURONTIN) 100 MG capsule Take 200 mg by mouth 2 (two) times daily. 08/10/18  Yes [provider]  metoprolol succinate (TOPROL-XL) 50 MG 24 hr tablet TAKE 1 AND 1/2 TABLETS DAILY Patient taking differently: Take 75 mg by mouth daily. TAKE 1 AND 1/2 TABLETS DAILY 02/07/18  Yes Branch, Alphonse Guild, MD  oxyCODONE-acetaminophen (PERCOCET/ROXICET) 5-325 MG tablet Take 0.5 tablets by mouth 4 (four) times daily as needed for moderate pain or severe pain.  07/27/18  Yes [provider]  potassium chloride SA (K-DUR,KLOR-CON) 20 MEQ tablet Take 1 tablet (20 mEq total) by mouth 3 (three) times a week. Takes only when Furosemide is taken Patient taking differently: Take 20 mEq by mouth daily. Takes only when Furosemide is taken 10/22/16  Yes Lendon Colonel, NP  PROAIR HFA 108 [phone] Base) MCG/ACT inhaler Inhale 1 puff into the lungs every 6 (six) hours as needed for wheezing or shortness of breath.  08/31/18  Yes [provider]  metoprolol succinate (TOPROL-XL) 50 MG 24 hr tablet TAKE 1 AND 1/2 TABLETS BY  MOUTH DAILY Patient not taking: Reported on 09/06/2018 07/04/18   Arnoldo Lenis, MD    Family History Family History  Problem Relation Age of Onset  . Hypertension Sister     Social History Social History   Tobacco Use  . Smoking status: Former Smoker    Packs/day: 0.50    Years: 35.00    Pack years: 17.50    Types: Cigarettes    Last attempt to quit: 01/1997    Years since quitting: 21.6  . Smokeless tobacco: Never Used  Substance Use Topics  . Alcohol use: No  . Drug use: No     Allergies   Erythromycin; Levaquin [levofloxacin]; Penicillins; and Sulfa antibiotics   Review of Systems Review of Systems  Constitutional: Negative for appetite change and fatigue.  HENT: Negative for congestion, ear discharge and sinus pressure.   Eyes: Negative for discharge.    Respiratory: Negative for cough.   Cardiovascular: Positive for chest pain.  Gastrointestinal: Negative for abdominal pain and diarrhea.  Genitourinary: Negative for frequency and hematuria.  Musculoskeletal: Negative for back pain.  Skin: Negative for rash.  Neurological: Negative for seizures and headaches.  Psychiatric/Behavioral: Negative for hallucinations.     Physical Exam Updated Vital Signs BP (!) 151/50   Pulse 76   Temp 97.9 F (36.6 C) (Oral)   Resp (!) 22   Ht 4\' 7"  (1.397 m)  Wt 54 kg   SpO2 94%   BMI 27.66 kg/m   Physical Exam  Constitutional: She is oriented to person, place, and time. She appears well-developed.  HENT:  Head: Normocephalic.  Eyes: Conjunctivae and EOM are normal. No scleral icterus.  Neck: Neck supple. No thyromegaly present.  Cardiovascular: Normal rate and regular rhythm. Exam reveals no gallop and no friction rub.  No murmur heard. Pulmonary/Chest: No stridor. She has no wheezes. She has no rales. She exhibits no tenderness.  Abdominal: She exhibits no distension. There is no tenderness. There is no rebound.  Musculoskeletal: Normal range of motion. She exhibits no edema.  Lymphadenopathy:    She has no cervical adenopathy.  Neurological: She is oriented to person, place, and time. She exhibits normal muscle tone. Coordination normal.  Skin: No rash noted. No erythema.  Psychiatric: She has a normal mood and affect. Her behavior is normal.     ED Treatments / Results  Labs (all labs ordered are listed, but only abnormal results are displayed) Labs Reviewed  CBC WITH DIFFERENTIAL/PLATELET - Abnormal; Notable for the following components:      Result Value   WBC 11.9 (*)    Neutro Abs 8.8 (*)    Eosinophils Absolute 0.6 (*)    All other components within normal limits  COMPREHENSIVE METABOLIC PANEL - Abnormal; Notable for the following components:   Potassium 2.9 (*)    Glucose, Bld 112 (*)    All other components within  normal limits  BRAIN NATRIURETIC PEPTIDE - Abnormal; Notable for the following components:   B Natriuretic Peptide 202.0 (*)    All other components within normal limits  MAGNESIUM  PHOSPHORUS  TROPONIN I  TROPONIN I  I-STAT TROPONIN, ED    EKG None  Radiology Dg Chest Portable 1 View  Result Date: 09/06/2018 CLINICAL DATA:  Chest pain EXAM: PORTABLE CHEST 1 VIEW COMPARISON:  06/24/2017, CT 05/23/2017 FINDINGS: No pleural effusion or pneumothorax. Stable mild cardiomegaly with aortic atherosclerosis. Possible right upper lobe lung nodule. IMPRESSION: 1. No radiographic evidence for acute cardiopulmonary abnormality. 2. Possible right upper lobe lung nodule. Further evaluation with chest CT suggested 3. Mild cardiomegaly Electronically Signed   By: Donavan Foil M.D.   On: 09/06/2018 21:29    Procedures Procedures (including critical care time)  Medications Ordered in ED Medications  morphine 2 MG/ML injection 2 mg (2 mg Intravenous Not Given 09/06/18 2127)  nitroGLYCERIN (NITROSTAT) SL tablet 0.4 mg (0.4 mg Sublingual Given 09/06/18 2126)  potassium chloride SA (K-DUR,KLOR-CON) CR tablet 40 mEq (has no administration in time range)     Initial Impression / Assessment and Plan / ED Course  I have reviewed the triage vital signs and the nursing notes.  Pertinent labs & imaging results that were available during my care of the patient were reviewed by me and considered in my medical decision making (see chart for details).     Labs unremarkable.  Patient's pain was relieved by nitro.  She will be admitted for chest pain rule out  Final Clinical Impressions(s) / ED Diagnoses   Final diagnoses:  Unstable angina pectoris [facility])    ED Discharge Orders    None       Milton Ferguson, MD 09/06/18 2215

## 2018-09-06 NOTE — H&P (Signed)
History and Physical    Hannah Mckee YSA:[id number] DOB: 07-May-1931 DOA: 09/06/2018  PCP: Sinda Du, MD   Patient coming from: Home.  I have personally briefly reviewed patient's old medical records in [place]  Chief Complaint: Right-sided jaw pain.  HPI: Hannah Mckee is a 84 y.o. female with medical history significant of systolic CHF, COPD, history of herpes zoster, hypertension, urolithiasis, osteoarthritis, sialolithiasis, history of sepsis who is coming to the emergency department with complaints of right-sided jaw pain that radiates to her right sided chest associated with dyspnea since around 1500 today.  She took 481 mg aspirin at home.  She denies palpitations, dizziness, diaphoresis, PND, or orthopnea.  However, she gets occasional lower extremity edema.  She denies fever, chills, sore throat, wheezing, hemoptysis, abdominal pain, nausea, emesis, diarrhea, melena or hematochezia.  She gets frequent constipation.  She denies dysuria, frequency or hematuria.  Denies polyuria, polydipsia, polyphagia or blurred vision.  Denies heat intolerance.  ED Course: Initial vital signs temperature 97.9 F, pulse 87, respirations 25, blood pressure 174/66 mmHg and O2 sat 97% on room air.  I ordered K-Dur 40 mEq p.o. x1 dose with magnesium sulfate supplementation in the ED.  White count 11.9 with a normal differential, hemoglobin 12.0 g/dL and platelets 247.  CMP shows potassium of 2.9 mmol/L and a glucose of 112 mg/dL, all other values are within normal limits.  BNP was 202 pg/mL.  Magnesium and phosphorus were normal.  Review of Systems: As per HPI otherwise 10 point review of systems negative.   Past Medical History:  Diagnosis Date  . CHF (congestive heart failure) ([place])    a. EF 35-40% by echo in 10/2016 b. improved to 55-60% by echo in 04/2017  . COPD (chronic obstructive pulmonary disease) (H[facility])    no home O2  . History of shingles   . Hypertension   . Kidney  stones   . Osteoarthritis   . Salivary gland stone   . Sepsis [facility])     Past Surgical History:  Procedure Laterality Date  . bilateral cataract surg    . BUNIONECTOMY    . CHOLECYSTECTOMY    . COLONOSCOPY  11/16/2012   Procedure: COLONOSCOPY;  Surgeon: Rogene Houston, MD;  Location: [facility];  Service: Endoscopy;  Laterality: N/A;  100  . LITHOTRIPSY    . ORIF PERIPROSTHETIC FRACTURE Right 10/29/2014   Procedure: OPEN REDUCTION INTERNAL FIXATION (ORIF) PERIPROSTHETIC FEMUR FRACTURE/FEMUR SHAFT;  Surgeon: Mcarthur Rossetti, MD;  Location: [facility];  Service: Orthopedics;  Laterality: Right;  . TONSILLECTOMY    . TOTAL HIP ARTHROPLASTY     1989     reports that she quit smoking about 21 years ago. Her smoking use included cigarettes. She has a 17.50 pack-year smoking history. She has never used smokeless tobacco. She reports that she does not drink alcohol or use drugs.  Allergies  Allergen Reactions  . Erythromycin Swelling  . Levaquin [Levofloxacin] Other (See Comments)    Tongue turns red and sore  . Penicillins Swelling    Has patient had a PCN reaction causing immediate rash, facial/tongue/throat swelling, SOB or lightheadedness with hypotension: Yes Has patient had a PCN reaction causing severe rash involving mucus membranes or skin necrosis: No Has patient had a PCN reaction that required hospitalization No Has patient had a PCN reaction occurring within the last 10 years: No If all of the above answers are "NO", then may proceed with Cephalosporin use.   Marland Kitchen  Sulfa Antibiotics Other (See Comments)    Tongue turns red and sore    Family History  Problem Relation Age of Onset  . Hypertension Sister     Prior to Admission medications   Medication Sig Start Date End Date Taking? Authorizing Provider  amLODipine (NORVASC) 2.5 MG tablet TAKE 1 TABLET BY MOUTH  DAILY Patient taking differently: Take 2.5 mg by mouth daily.  07/04/18  Yes Branch, Alphonse Guild, MD  ANORO  ELLIPTA 62.5-25 MCG/INH AEPB Inhale 1 puff into the lungs at bedtime.  02/17/17  Yes [provider]  aspirin EC 81 MG tablet Take 81 mg by mouth daily.   Yes [provider]  clonazePAM (KLONOPIN) 0.5 MG tablet Take 0.125 mg by mouth daily as needed for anxiety.    Yes [provider]  feeding supplement, ENSURE ENLIVE, (ENSURE ENLIVE) LIQD Take 237 mLs by mouth 2 (two) times daily between meals. Patient taking differently: Take 237 mLs by mouth daily.  10/11/16  Yes Dhungel, Nishant, MD  furosemide (LASIX) 40 MG tablet Take 1 tablet (40 mg total) by mouth daily. (May take an extra 1/2 tab as needed for severe swelling.) 06/12/18  Yes Branch, Alphonse Guild, MD  gabapentin (NEURONTIN) 100 MG capsule Take 200 mg by mouth 2 (two) times daily. 08/10/18  Yes [provider]  metoprolol succinate (TOPROL-XL) 50 MG 24 hr tablet TAKE 1 AND 1/2 TABLETS DAILY Patient taking differently: Take 75 mg by mouth daily. TAKE 1 AND 1/2 TABLETS DAILY 02/07/18  Yes Branch, Alphonse Guild, MD  oxyCODONE-acetaminophen (PERCOCET/ROXICET) 5-325 MG tablet Take 0.5 tablets by mouth 4 (four) times daily as needed for moderate pain or severe pain.  07/27/18  Yes [provider]  potassium chloride SA (K-DUR,KLOR-CON) 20 MEQ tablet Take 1 tablet (20 mEq total) by mouth 3 (three) times a week. Takes only when Furosemide is taken Patient taking differently: Take 20 mEq by mouth daily. Takes only when Furosemide is taken 10/22/16  Yes Lendon Colonel, NP  PROAIR HFA 108 [phone] Base) MCG/ACT inhaler Inhale 1 puff into the lungs every 6 (six) hours as needed for wheezing or shortness of breath.  08/31/18  Yes [provider]  metoprolol succinate (TOPROL-XL) 50 MG 24 hr tablet TAKE 1 AND 1/2 TABLETS BY  MOUTH DAILY Patient not taking: Reported on 09/06/2018 07/04/18   Arnoldo Lenis, MD    Physical Exam: Vitals:   09/06/18 2038 09/06/18 2041 09/06/18 2100  BP:  (!) 174/66 (!) 151/50    Pulse:  87 76  Resp:  (!) 25 (!) 22  Temp:  97.9 F (36.6 C)   TempSrc:  Oral   SpO2:  97% 94%  Weight: 54 kg    Height: 4\' 7"  (1.397 m)      Constitutional: Frail, elderly female.  NAD, calm, comfortable Eyes: PERRL, lids and conjunctivae normal ENMT: Mucous membranes are moist. Posterior pharynx clear of any exudate or lesions. Neck: normal, supple, no masses, no thyromegaly Respiratory: Decreased breath sounds on bases, otherwise clear to auscultation bilaterally, no wheezing, no crackles. Normal respiratory effort. No accessory muscle use.  Cardiovascular: Regular rate and rhythm, no murmurs / rubs / gallops. No extremity edema. 2+ pedal pulses. No carotid bruits.  Abdomen: Soft, no tenderness, no masses palpated. No hepatosplenomegaly. Bowel sounds positive.  Musculoskeletal: no clubbing / cyanosis.  Good ROM, no contractures. Normal muscle tone.  Skin: Positive hyperkeratosis and mild erythema of feet and right pretibial area consistent with tinea pedis/core  Portis and onychomycosis of all toenails. Neurologic: CN 2-12 grossly intact. Sensation intact, DTR normal. Strength 5/5 in all 4.  Psychiatric: Normal judgment and insight. Alert and oriented x 3. Normal mood.   Labs on Admission: I have personally reviewed following labs and imaging studies  CBC: Recent Labs  Lab 09/06/18 2045  WBC 11.9*  NEUTROABS 8.8*  HGB 12.0  HCT 38.7  MCV 90.6  PLT 259   Basic Metabolic Panel: Recent Labs  Lab 09/06/18 2045  NA 136  K 2.9*  CL 99  CO2 26  GLUCOSE 112*  BUN 13  CREATININE 0.75  CALCIUM 9.1  MG 2.0  PHOS 3.5   GFR: Estimated Creatinine Clearance: 34.1 mL/min (by C-G formula based on SCr of 0.75 mg/dL). Liver Function Tests: Recent Labs  Lab 09/06/18 2045  AST 19  ALT 13  ALKPHOS 96  BILITOT 0.6  PROT 7.4  ALBUMIN 3.7   No results for input(s): LIPASE, AMYLASE in the last 168 hours. No results for input(s): AMMONIA in the last 168 hours. Coagulation  Profile: No results for input(s): INR, PROTIME in the last 168 hours. Cardiac Enzymes: No results for input(s): CKTOTAL, CKMB, CKMBINDEX, TROPONINI in the last 168 hours. BNP (last 3 results) No results for input(s): PROBNP in the last 8760 hours. HbA1C: No results for input(s): HGBA1C in the last 72 hours. CBG: No results for input(s): GLUCAP in the last 168 hours. Lipid Profile: No results for input(s): CHOL, HDL, LDLCALC, TRIG, CHOLHDL, LDLDIRECT in the last 72 hours. Thyroid Function Tests: No results for input(s): TSH, T4TOTAL, FREET4, T3FREE, THYROIDAB in the last 72 hours. Anemia Panel: No results for input(s): VITAMINB12, FOLATE, FERRITIN, TIBC, IRON, RETICCTPCT in the last 72 hours. Urine analysis:    Component Value Date/Time   COLORURINE STRAW (A) 05/23/2017 1151   APPEARANCEUR CLEAR 05/23/2017 1151   LABSPEC 1.005 05/23/2017 1151   PHURINE 7.0 05/23/2017 1151   GLUCOSEU NEGATIVE 05/23/2017 1151   HGBUR SMALL (A) 05/23/2017 1151   BILIRUBINUR NEGATIVE 05/23/2017 1151   KETONESUR NEGATIVE 05/23/2017 1151   PROTEINUR NEGATIVE 05/23/2017 1151   NITRITE NEGATIVE 05/23/2017 1151   LEUKOCYTESUR NEGATIVE 05/23/2017 1151    Radiological Exams on Admission: Dg Chest Portable 1 View  Result Date: 09/06/2018 CLINICAL DATA:  Chest pain EXAM: PORTABLE CHEST 1 VIEW COMPARISON:  06/24/2017, CT 05/23/2017 FINDINGS: No pleural effusion or pneumothorax. Stable mild cardiomegaly with aortic atherosclerosis. Possible right upper lobe lung nodule. IMPRESSION: 1. No radiographic evidence for acute cardiopulmonary abnormality. 2. Possible right upper lobe lung nodule. Further evaluation with chest CT suggested 3. Mild cardiomegaly Electronically Signed   By: Donavan Foil M.D.   On: 09/06/2018 21:29   10/06/2016 echocardiogram complete ------------------------------------------------------------------- LV EF: 35% -    40%  ------------------------------------------------------------------- Indications:      Cardiomyopathy - Unspecified 425.9.  ------------------------------------------------------------------- History:   PMH:  Elevated Troponin, Sepsis.  Coronary artery disease.  Congestive heart failure.  ------------------------------------------------------------------- Study Conclusions  - Left ventricle: The cavity size was normal. Wall thickness was   increased in a pattern of mild LVH. Systolic function was   moderately reduced. The estimated ejection fraction was in the   range of 35% to 40%. Diffuse hypokinesis. There is severe   hypokinesis of the basalinferior myocardium. Features are   consistent with a pseudonormal left ventricular filling pattern,   with concomitant abnormal relaxation and increased filling   pressure (grade 2 diastolic dysfunction). - Aortic valve: Moderately calcified annulus. Trileaflet. - Mitral  valve: Calcified annulus. There was mild to moderate   regurgitation. - Left atrium: The atrium was severely dilated. - Right atrium: The atrium was mildly dilated. Central venous   pressure (est): 3 mm Hg. - Atrial septum: No defect or patent foramen ovale was identified. - Tricuspid valve: There was moderate regurgitation. - Pulmonary arteries: PA peak pressure: 50 mm Hg (S). - Pericardium, extracardiac: There was no pericardial effusion.  Impressions:  - Mild LVH with LVEF approximately 35-40% in the setting of   tachycardia. There is diffuse hypokinesis, most severe in the   basal inferior wall. Probable grade 2 diastolic dysfunction.   Severe left atrial enlargement. Calcified mitral annulus with   mild to moderate mitral regurgitation. Moderately calcified   aortic annulus. Moderate tricuspid regurgitation with PASP 15   mmHg.  04/07/2017 echocardiogram complete ------------------------------------------------------------------- LV EF: 55% -    60%  ------------------------------------------------------------------- History:   PMH:   Coronary artery disease.  Congestive heart failure.  Chronic obstructive pulmonary disease.  Risk factors: Former tobacco use.  ------------------------------------------------------------------- Study Conclusions  - Left ventricle: The cavity size was normal. Wall thickness was   normal. Systolic function was normal. The estimated ejection   fraction was in the range of 55% to 60%. Doppler parameters are   consistent with abnormal left ventricular relaxation (grade 1   diastolic dysfunction). Doppler parameters are consistent with   high ventricular filling pressure. - Aortic valve: Mildly calcified annulus. Trileaflet; normal   thickness leaflets. There was mild regurgitation. Valve area   (VTI): 1.98 cm^2. Valve area (Vmax): 2.1 cm^2. Valve area   (Vmean): 2.03 cm^2. - Mitral valve: Mildly calcified annulus. Normal thickness leaflets   . There was mild regurgitation. - Left atrium: The atrium was moderately dilated. - Right atrium: The atrium was mildly dilated. - Pulmonary arteries: Systolic pressure was mildly increased. PA   peak pressure: 38 mm Hg (S). - Technically adequate study.  EKG: Independently reviewed.  EKG is not available in EMR. Unable to find paper tracing. No ischemic changes per Dr. Roderic Palau.  Assessment/Plan Principal Problem:   Chest pain Observation/telemetry. Continue supplemental oxygen. Continue aspirin. Continue beta-blocker. Trend troponin level. Check echocardiogram in a.m.  Active Problems:   Hypertension Continue amlodipine 2.5 mg p.o. daily. Continue metoprolol 75 mg p.o. daily. On furosemide 40 mg p.o. daily. Monitor BP, HR, renal function and electrolytes.    COPD (chronic obstructive pulmonary disease) (HCC) Supplemental oxygen and bronchodilators as needed.    Hypokalemia Replaced Follow-up potassium level PRN.    Chronic diastolic  CHF (congestive heart failure) (HCC) No signs of decompensation at this time. Continue furosemide 40 mg p.o. daily as needed.    DVT prophylaxis: Heparin SQ. Code Status: Full code. Family Communication: Disposition Plan: Observation for troponin level trending. Consults called: Routine cardiology counseling a.m. Admission status: Observation/telemetry.   Reubin Milan MD [facility] Pager [phone].  If 7PM-7AM, please contact night-coverage [email or web address] Password [email or web address]  09/06/2018, 11:08 PM

## 2018-09-07 ENCOUNTER — Encounter (HOSPITAL_COMMUNITY): Payer: Self-pay | Admitting: Cardiology

## 2018-09-07 ENCOUNTER — Observation Stay (HOSPITAL_COMMUNITY): Payer: Medicare Other

## 2018-09-07 ENCOUNTER — Observation Stay (HOSPITAL_BASED_OUTPATIENT_CLINIC_OR_DEPARTMENT_OTHER): Payer: Medicare Other

## 2018-09-07 DIAGNOSIS — I200 Unstable angina: Secondary | ICD-10-CM | POA: Diagnosis not present

## 2018-09-07 DIAGNOSIS — I10 Essential (primary) hypertension: Secondary | ICD-10-CM

## 2018-09-07 DIAGNOSIS — Z8679 Personal history of other diseases of the circulatory system: Secondary | ICD-10-CM

## 2018-09-07 DIAGNOSIS — I110 Hypertensive heart disease with heart failure: Secondary | ICD-10-CM | POA: Diagnosis not present

## 2018-09-07 DIAGNOSIS — R0602 Shortness of breath: Secondary | ICD-10-CM | POA: Diagnosis not present

## 2018-09-07 DIAGNOSIS — R072 Precordial pain: Secondary | ICD-10-CM | POA: Diagnosis not present

## 2018-09-07 DIAGNOSIS — I5022 Chronic systolic (congestive) heart failure: Secondary | ICD-10-CM

## 2018-09-07 DIAGNOSIS — I25119 Atherosclerotic heart disease of native coronary artery with unspecified angina pectoris: Secondary | ICD-10-CM | POA: Diagnosis not present

## 2018-09-07 DIAGNOSIS — J9611 Chronic respiratory failure with hypoxia: Secondary | ICD-10-CM | POA: Diagnosis not present

## 2018-09-07 DIAGNOSIS — I5030 Unspecified diastolic (congestive) heart failure: Secondary | ICD-10-CM

## 2018-09-07 DIAGNOSIS — I5032 Chronic diastolic (congestive) heart failure: Secondary | ICD-10-CM | POA: Diagnosis not present

## 2018-09-07 DIAGNOSIS — E876 Hypokalemia: Secondary | ICD-10-CM | POA: Diagnosis not present

## 2018-09-07 DIAGNOSIS — J984 Other disorders of lung: Secondary | ICD-10-CM | POA: Diagnosis not present

## 2018-09-07 DIAGNOSIS — Z7982 Long term (current) use of aspirin: Secondary | ICD-10-CM | POA: Diagnosis not present

## 2018-09-07 LAB — BASIC METABOLIC PANEL
ANION GAP: 8 (ref 5–15)
BUN: 13 mg/dL (ref 8–23)
CHLORIDE: 106 mmol/L (ref 98–111)
CO2: 27 mmol/L (ref 22–32)
Calcium: 9 mg/dL (ref 8.9–10.3)
Creatinine, Ser: 0.72 mg/dL (ref 0.44–1.00)
GFR calc non Af Amer: >60 mL/min (ref >60)
GLUCOSE: 92 mg/dL (ref 70–99)
POTASSIUM: 3.9 mmol/L (ref 3.5–5.1)
Sodium: 141 mmol/L (ref 135–145)

## 2018-09-07 LAB — ECHOCARDIOGRAM COMPLETE
Height: 55 in
Weight: 1908.3 oz

## 2018-09-07 LAB — TROPONIN I
Troponin I: <0.03 ng/mL (ref <0.03)
Troponin I: <0.03 ng/mL (ref <0.03)

## 2018-09-07 LAB — SEDIMENTATION RATE: Sed Rate: 44 mm/hr — ABNORMAL HIGH (ref 0–22)

## 2018-09-07 MED ORDER — ASPIRIN EC 81 MG PO TBEC
81.0000 mg | DELAYED_RELEASE_TABLET | Freq: Every day | ORAL | Status: DC
  Administered 2018-09-07: 81 mg via ORAL
  Filled 2018-09-07: qty 1

## 2018-09-07 MED ORDER — AMLODIPINE BESYLATE 5 MG PO TABS: 5.0000 mg | ORAL_TABLET | Freq: Every day | ORAL | Status: DC

## 2018-09-07 MED ORDER — AMLODIPINE BESYLATE 5 MG PO TABS: 5.0000 mg | ORAL_TABLET | Freq: Every day | ORAL | 1 refills | Status: DC

## 2018-09-07 MED ORDER — METOPROLOL SUCCINATE ER 50 MG PO TB24
75.0000 mg | ORAL_TABLET | Freq: Every day | ORAL | Status: DC
  Administered 2018-09-07: 75 mg via ORAL
  Filled 2018-09-07: qty 1

## 2018-09-07 MED ORDER — POTASSIUM CHLORIDE CRYS ER 20 MEQ PO TBCR
40.0000 meq | EXTENDED_RELEASE_TABLET | Freq: Every day | ORAL | Status: DC
  Administered 2018-09-07: 40 meq via ORAL
  Filled 2018-09-07: qty 2

## 2018-09-07 MED ORDER — ALBUTEROL SULFATE (2.5 MG/3ML) 0.083% IN NEBU: 3.0000 mL | INHALATION_SOLUTION | Freq: Four times a day (QID) | RESPIRATORY_TRACT | Status: DC | PRN

## 2018-09-07 MED ORDER — UMECLIDINIUM-VILANTEROL 62.5-25 MCG/INH IN AEPB
1.0000 | INHALATION_SPRAY | Freq: Every day | RESPIRATORY_TRACT | Status: DC
  Administered 2018-09-07: 1 via RESPIRATORY_TRACT
  Filled 2018-09-07: qty 14

## 2018-09-07 MED ORDER — NITROGLYCERIN 0.4 MG SL SUBL: 0.4000 mg | SUBLINGUAL_TABLET | SUBLINGUAL | 1 refills | Status: DC | PRN

## 2018-09-07 MED ORDER — FUROSEMIDE 40 MG PO TABS
40.0000 mg | ORAL_TABLET | Freq: Every day | ORAL | Status: DC
  Administered 2018-09-07: 40 mg via ORAL
  Filled 2018-09-07: qty 1

## 2018-09-07 MED ORDER — OXYCODONE-ACETAMINOPHEN 5-325 MG PO TABS
0.5000 | ORAL_TABLET | Freq: Four times a day (QID) | ORAL | Status: DC | PRN
  Administered 2018-09-07: 0.5 via ORAL
  Filled 2018-09-07: qty 1

## 2018-09-07 MED ORDER — AMLODIPINE BESYLATE 5 MG PO TABS
2.5000 mg | ORAL_TABLET | Freq: Every day | ORAL | Status: DC
  Administered 2018-09-07: 2.5 mg via ORAL
  Filled 2018-09-07: qty 1

## 2018-09-07 MED ORDER — CLONAZEPAM 0.125 MG PO TBDP: 0.1250 mg | ORAL_TABLET | Freq: Every day | ORAL | Status: DC | PRN

## 2018-09-07 MED ORDER — GABAPENTIN 100 MG PO CAPS
200.0000 mg | ORAL_CAPSULE | Freq: Two times a day (BID) | ORAL | Status: DC
  Administered 2018-09-07 (×2): 200 mg via ORAL
  Filled 2018-09-07 (×2): qty 2

## 2018-09-07 NOTE — Progress Notes (Signed)
PROGRESS NOTE  Hannah Mckee KXF:[id number] DOB: 1931-01-14 DOA: 09/06/2018 PCP: Sinda Du, MD  Brief History:  84 year old female with a history of systolic CHF, COPD, chronic respiratory failure on 1.5 L at nighttime, hypertension, sialolithiasis presenting with right jaw pain and chest discomfort that started on 09/06/2018.  The patient stated that it was a dull ache on the right side of her jaw with concomitant chest discomfort.  The patient states that she was sitting down watching television when all this occurred.  Patient denies any worsening shortness of breath, palpitations, syncope, dizziness, nausea, vomiting.  The patient denied any visual disturbance, eye pain, or difficulty with mastication.  She has not had any recent fevers, chills, headaches.  She stated that she had been in her usual state of health and feeling great.  She stated that the jaw pain and chest discomfort lasted a possible 3 to 4 hours.  She presented for further med evaluation.  In the emergency department, patient was afebrile hemodynamically stable saturating 90% on room air.  BMP showed potassium 2.9 with normal LFTs.  WBC was 1.9.  Chest x-ray showed chronic interstitial changes with questionable right upper lobe nodule.  Assessment/Plan: Atypical chest discomfort/jaw pain -Cardiology consult -Troponins negative x2 -Obtain EKG -Echocardiogram  Chronic systolic CHF -echo 69/6789 LVEF 35-40%. Familyasked to deferischemic testing at that time. -f/u echo after medical therapy showed normalization of LVEF.04/2017 echo LVEF 38-10%, grade I diastolic dysfunction. Mild AI. -Clinically euvolemic -Continue home dose furosemide -Continue metoprolol succinate  Essential hypertension -Continue metoprolol succinate and amlodipine  Hypokalemia -Replete -Check magnesium--2.0  Jaw pain -Nonspecific -No signs of TMJ on examination -No complaints of visual disturbance or headaches -Check  ESR  Chronic respiratory failure with hypoxia -Patient is normally on 1.5 L at nighttime -Stable presently  COPD -Stable without exacerbation -Continue Anoro  Abnormal CXR -question RUL nodule -CT chest    Disposition Plan:   Home in 1-2 days  Family Communication:   Daughter updated at bedside 11/7  Consultants:  cardiology  Code Status:  FULL  DVT Prophylaxis:  [place] Heparin    Procedures: As Listed in Progress Note Above  Antibiotics: None         Subjective: Patient denies fevers, chills, headache, chest pain, dyspnea, nausea, vomiting, diarrhea, abdominal pain, dysuria, hematuria, hematochezia, and melena.   Objective: Vitals:   09/07/18 0000 09/07/18 0022 09/07/18 0218 09/07/18 0420  BP:  (!) 161/48  (!) 152/49  Pulse:  73  77  Resp:  18  18  Temp:  97.8 F (36.6 C)  (!) 97.4 F (36.3 C)  TempSrc:  Oral  Oral  SpO2:  96% 98% 98%  Weight: 54.1 kg     Height: '4\' 7"'$  (1.397 m)       Intake/Output Summary (Last 24 hours) at 09/07/2018 1751 Last data filed at 09/07/2018 0328 Gross per 24 hour  Intake 55.84 ml  Output -  Net 55.84 ml   Weight change:  Exam:   General:  Pt is alert, follows commands appropriately, not in acute distress  HEENT: No icterus, No thrush, No neck mass, [place]/AT  Cardiovascular: RRR, S1/S2, no rubs, no gallops  Respiratory: Bibasilar crackles but no wheeze.  Good air movement.  Abdomen: Soft/+BS, non tender, non distended, no guarding  Extremities: No edema, No lymphangitis, No petechiae, No rashes, no synovitis   Data Reviewed: I have personally reviewed following labs and imaging studies Basic Metabolic Panel:  Recent Labs  Lab 09/06/18 2045  NA 136  K 2.9*  CL 99  CO2 26  GLUCOSE 112*  BUN 13  CREATININE 0.75  CALCIUM 9.1  MG 2.0  PHOS 3.5   Liver Function Tests: Recent Labs  Lab 09/06/18 2045  AST 19  ALT 13  ALKPHOS 96  BILITOT 0.6  PROT 7.4  ALBUMIN 3.7   No results for input(s): LIPASE,  AMYLASE in the last 168 hours. No results for input(s): AMMONIA in the last 168 hours. Coagulation Profile: No results for input(s): INR, PROTIME in the last 168 hours. CBC: Recent Labs  Lab 09/06/18 2045  WBC 11.9*  NEUTROABS 8.8*  HGB 12.0  HCT 38.7  MCV 90.6  PLT 247   Cardiac Enzymes: Recent Labs  Lab 09/07/18 0214  TROPONINI <0.03   BNP: Invalid input(s): POCBNP CBG: No results for input(s): GLUCAP in the last 168 hours. HbA1C: No results for input(s): HGBA1C in the last 72 hours. Urine analysis:    Component Value Date/Time   COLORURINE STRAW (A) 05/23/2017 1151   APPEARANCEUR CLEAR 05/23/2017 1151   LABSPEC 1.005 05/23/2017 1151   PHURINE 7.0 05/23/2017 1151   GLUCOSEU NEGATIVE 05/23/2017 1151   HGBUR SMALL (A) 05/23/2017 1151   BILIRUBINUR NEGATIVE 05/23/2017 1151   KETONESUR NEGATIVE 05/23/2017 1151   PROTEINUR NEGATIVE 05/23/2017 1151   NITRITE NEGATIVE 05/23/2017 1151   LEUKOCYTESUR NEGATIVE 05/23/2017 1151   Sepsis Labs: '@LABRCNTIP'$ (procalcitonin:4,lacticidven:4) )No results found for this or any previous visit (from the past 240 hour(s)).   Scheduled Meds: . amLODipine  2.5 mg Oral Daily  . aspirin EC  81 mg Oral Daily  . furosemide  40 mg Oral Daily  . gabapentin  200 mg Oral BID  . heparin  5,000 Units Subcutaneous Q8H  . metoprolol succinate  75 mg Oral Daily  .  morphine injection  2 mg Intravenous Once  . potassium chloride SA  40 mEq Oral Daily  . umeclidinium-vilanterol  1 puff Inhalation QHS   Continuous Infusions: . sodium chloride Stopped (09/07/18 0134)    Procedures/Studies: Dg Chest Portable 1 View  Result Date: 09/06/2018 CLINICAL DATA:  Chest pain EXAM: PORTABLE CHEST 1 VIEW COMPARISON:  06/24/2017, CT 05/23/2017 FINDINGS: No pleural effusion or pneumothorax. Stable mild cardiomegaly with aortic atherosclerosis. Possible right upper lobe lung nodule. IMPRESSION: 1. No radiographic evidence for acute cardiopulmonary  abnormality. 2. Possible right upper lobe lung nodule. Further evaluation with chest CT suggested 3. Mild cardiomegaly Electronically Signed   By: Donavan Foil M.D.   On: 09/06/2018 21:29    Orson Eva, DO  [facility] Pager [phone]  If 7PM-7AM, please contact night-coverage [email or web address] Password [email or web address] 09/07/2018, 8:32 AM   LOS: 0 days

## 2018-09-07 NOTE — Progress Notes (Signed)
**Note De-identified Pat Elicker Obfuscation** EKG complete and placed in patient chart 

## 2018-09-07 NOTE — Progress Notes (Signed)
*  PRELIMINARY RESULTS* Echocardiogram 2D Echocardiogram has been performed.  Hannah Mckee 09/07/2018, 12:20 PM

## 2018-09-07 NOTE — Discharge Summary (Signed)
Physician Discharge Summary  Hannah Mckee QQV:[id number] DOB: 1931/03/20 DOA: 09/06/2018  PCP: Sinda Du, MD  Admit date: 09/06/2018 Discharge date: 09/07/2018  Admitted From: Home Disposition:  Home   Recommendations for Outpatient Follow-up:  1. Follow up with PCP in 1-2 weeks 2. Please obtain BMP/CBC in one week    Discharge Condition: Stable CODE STATUS: FULL Diet recommendation: Heart Healthy   Brief/Interim Summary: 84 year old female with a history of systolic CHF, COPD, chronic respiratory failure on 1.5 L at nighttime, hypertension, sialolithiasis presenting with right jaw pain and chest discomfort that started on 09/06/2018.  The patient stated that it was a dull ache on the right side of her jaw with concomitant chest discomfort.  The patient states that she was sitting down watching television when all this occurred.  Patient denies any worsening shortness of breath, palpitations, syncope, dizziness, nausea, vomiting.  The patient denied any visual disturbance, eye pain, or difficulty with mastication.  She has not had any recent fevers, chills, headaches.  She stated that she had been in her usual state of health and feeling great.  She stated that the jaw pain and chest discomfort lasted a possible 3 to 4 hours.  She presented for further med evaluation.  In the emergency department, patient was afebrile hemodynamically stable saturating 90% on room air.  BMP showed potassium 2.9 with normal LFTs.  WBC was 1.9.  Chest x-ray showed chronic interstitial changes with questionable right upper lobe nodule.  Discharge Diagnoses:  Atypical chest discomfort/jaw pain -Cardiology consult appreciated-->no further testing; increase amlodipine to 5 mg daily -Troponins negative x2 -Obtain EKG--personally reviewed--sinus, no STT changes -09/07/18--Echocardiogram--EG 55-60%, no WMA, unchanged -okay to send home per cardiology with outpt follow up since echo stable  Chronic  systolic CHF -echo 33/2951 LVEF 35-40%. Familyasked to deferischemic testing at that time. -f/u echo after medical therapy showed normalization of LVEF.04/2017 echo LVEF 88-41%, grade I diastolic dysfunction. Mild AI. -Clinically euvolemic -Continue home dose furosemide -Continue metoprolol succinate  Essential hypertension -Continue metoprolol succinate and amlodipine -increased amlodipine to 5 mg daily  Hypokalemia -Replete -Check magnesium--2.0  Jaw pain -Nonspecific -No signs of TMJ on examination -No complaints of visual disturbance or headaches -Check ESR--44--essentially normal for patient's age  Chronic respiratory failure with hypoxia -Patient is normally on 1.5 L at nighttime -Stable presently  COPD -Stable without exacerbation -Continue Anoro  Abnormal CXR -question RUL nodule -CT chest--Slightly irregular nodular density in the right upper lobe along the major fissure measuring a maximum of 16 x 8 mm on the axial images--recommend repeat CT chest in 3-4 months -pt and daughter informed    Discharge Instructions   Allergies as of 09/07/2018      Reactions   Erythromycin Swelling   Levaquin [levofloxacin] Other (See Comments)   Tongue turns red and sore   Penicillins Swelling   Has patient had a PCN reaction causing immediate rash, facial/tongue/throat swelling, SOB or lightheadedness with hypotension: Yes Has patient had a PCN reaction causing severe rash involving mucus membranes or skin necrosis: No Has patient had a PCN reaction that required hospitalization No Has patient had a PCN reaction occurring within the last 10 years: No If all of the above answers are "NO", then may proceed with Cephalosporin use.   Sulfa Antibiotics Other (See Comments)   Tongue turns red and sore      Medication List    TAKE these medications   amLODipine 5 MG tablet Commonly known as:  NORVASC Take 1  tablet (5 mg total) by mouth daily. Start taking on:   09/08/2018 What changed:    medication strength  how much to take   ANORO ELLIPTA 62.5-25 MCG/INH Aepb Generic drug:  umeclidinium-vilanterol Inhale 1 puff into the lungs at bedtime.   aspirin EC 81 MG tablet Take 81 mg by mouth daily.   clonazePAM 0.5 MG tablet Commonly known as:  KLONOPIN Take 0.125 mg by mouth daily as needed for anxiety.   feeding supplement (ENSURE ENLIVE) Liqd Take 237 mLs by mouth 2 (two) times daily between meals. What changed:  when to take this   furosemide 40 MG tablet Commonly known as:  LASIX Take 1 tablet (40 mg total) by mouth daily. (May take an extra 1/2 tab as needed for severe swelling.)   gabapentin 100 MG capsule Commonly known as:  NEURONTIN Take 200 mg by mouth 2 (two) times daily.   metoprolol succinate 50 MG 24 hr tablet Commonly known as:  TOPROL-XL TAKE 1 AND 1/2 TABLETS DAILY What changed:    how much to take  how to take this  when to take this  Another medication with the same name was removed. Continue taking this medication, and follow the directions you see here.   nitroGLYCERIN 0.4 MG SL tablet Commonly known as:  NITROSTAT Place 1 tablet (0.4 mg total) under the tongue every 5 (five) minutes as needed for chest pain (CP or SOB).   oxyCODONE-acetaminophen 5-325 MG tablet Commonly known as:  PERCOCET/ROXICET Take 0.5 tablets by mouth 4 (four) times daily as needed for moderate pain or severe pain.   potassium chloride SA 20 MEQ tablet Commonly known as:  K-DUR,KLOR-CON Take 1 tablet (20 mEq total) by mouth 3 (three) times a week. Takes only when Furosemide is taken What changed:  when to take this   PROAIR HFA 108 (90 Base) MCG/ACT inhaler Generic drug:  albuterol Inhale 1 puff into the lungs every 6 (six) hours as needed for wheezing or shortness of breath.       Allergies  Allergen Reactions  . Erythromycin Swelling  . Levaquin [Levofloxacin] Other (See Comments)    Tongue turns red and sore  .  Penicillins Swelling    Has patient had a PCN reaction causing immediate rash, facial/tongue/throat swelling, SOB or lightheadedness with hypotension: Yes Has patient had a PCN reaction causing severe rash involving mucus membranes or skin necrosis: No Has patient had a PCN reaction that required hospitalization No Has patient had a PCN reaction occurring within the last 10 years: No If all of the above answers are "NO", then may proceed with Cephalosporin use.   . Sulfa Antibiotics Other (See Comments)    Tongue turns red and sore    Consultations:  cardiology   Procedures/Studies: Ct Chest Wo Contrast  Result Date: 09/07/2018 CLINICAL DATA:  Evaluate pulmonary nodule seen on recent chest x-ray. EXAM: CT CHEST WITHOUT CONTRAST TECHNIQUE: Multidetector CT imaging of the chest was performed following the standard protocol without IV contrast. COMPARISON:  Chest x-ray 09/06/2018 and chest CT 05/23/2017 FINDINGS: Cardiovascular: The heart is normal in size for age. Moderate enlargement of the left atrium. No pericardial effusion. Advanced atherosclerotic calcifications involving the thoracic aorta and three-vessel coronary artery calcifications. Mediastinum/Nodes: No mediastinal or hilar mass or lymphadenopathy. Calcified right hilar lymph nodes are noted. These are stable. The esophagus is grossly normal. Lungs/Pleura: Nodular thickening along the major fissure in the right upper lobe correlates with the abnormality on the chest x-ray. On  the sagittal reformatted images this has a more linear elongated appearance. It could be scarring or atelectasis. It does appear to be new since the prior chest CT. Basilar scarring changes are also noted but no worrisome pulmonary lesions or pulmonary nodules. No edema or bronchiectasis. Upper Abdomen: The upper abdomen is unremarkable. Advanced atherosclerotic calcifications involving the abdominal aorta. Musculoskeletal: No chest wall mass is identified. No  breast masses. Scattered axillary lymph nodes appears stable. Severe osteoporosis and markedly exaggerated thoracic kyphosis. Suspect changes of ankylosing spondylitis. IMPRESSION: 1. Slightly irregular nodular density in the right upper lobe along the major fissure measuring a maximum of 16 x 8 mm on the axial images. It appears more elongated and linear on the sagittal images and may be scarring or atelectasis. A follow-up noncontrast chest CT in 3-4 months is suggested. 2. Stable mild emphysematous changes and pulmonary scarring. 3. Stable advanced atherosclerotic calcifications involving the thoracic and abdominal aorta and branch vessels including extensive three-vessel coronary artery calcifications. 4. No mediastinal or hilar mass or adenopathy. 5. Suspect changes of ankylosing spondylitis. Aortic Atherosclerosis (ICD10-I70.0) and Emphysema (ICD10-J43.9). Electronically Signed   By: Marijo Sanes M.D.   On: 09/07/2018 10:13   Dg Chest Portable 1 View  Result Date: 09/06/2018 CLINICAL DATA:  Chest pain EXAM: PORTABLE CHEST 1 VIEW COMPARISON:  06/24/2017, CT 05/23/2017 FINDINGS: No pleural effusion or pneumothorax. Stable mild cardiomegaly with aortic atherosclerosis. Possible right upper lobe lung nodule. IMPRESSION: 1. No radiographic evidence for acute cardiopulmonary abnormality. 2. Possible right upper lobe lung nodule. Further evaluation with chest CT suggested 3. Mild cardiomegaly Electronically Signed   By: Donavan Foil M.D.   On: 09/06/2018 21:29         Discharge Exam: Vitals:   09/07/18 0420 09/07/18 1428  BP: (!) 152/49 (!) 125/47  Pulse: 77 82  Resp: 18 20  Temp: (!) 97.4 F (36.3 C) 97.9 F (36.6 C)  SpO2: 98% 97%   Vitals:   09/07/18 0022 09/07/18 0218 09/07/18 0420 09/07/18 1428  BP: (!) 161/48  (!) 152/49 (!) 125/47  Pulse: 73  77 82  Resp: '18  18 20  '$ Temp: 97.8 F (36.6 C)  (!) 97.4 F (36.3 C) 97.9 F (36.6 C)  TempSrc: Oral  Oral   SpO2: 96% 98% 98% 97%    Weight:      Height:        General: Pt is alert, awake, not in acute distress Cardiovascular: RRR, S1/S2 +, no rubs, no gallops Respiratory: bibasilar crackles, no wheeze Abdominal: Soft, NT, ND, bowel sounds + Extremities: no edema, no cyanosis   The results of significant diagnostics from this hospitalization (including imaging, microbiology, ancillary and laboratory) are listed below for reference.    Significant Diagnostic Studies: Ct Chest Wo Contrast  Result Date: 09/07/2018 CLINICAL DATA:  Evaluate pulmonary nodule seen on recent chest x-ray. EXAM: CT CHEST WITHOUT CONTRAST TECHNIQUE: Multidetector CT imaging of the chest was performed following the standard protocol without IV contrast. COMPARISON:  Chest x-ray 09/06/2018 and chest CT 05/23/2017 FINDINGS: Cardiovascular: The heart is normal in size for age. Moderate enlargement of the left atrium. No pericardial effusion. Advanced atherosclerotic calcifications involving the thoracic aorta and three-vessel coronary artery calcifications. Mediastinum/Nodes: No mediastinal or hilar mass or lymphadenopathy. Calcified right hilar lymph nodes are noted. These are stable. The esophagus is grossly normal. Lungs/Pleura: Nodular thickening along the major fissure in the right upper lobe correlates with the abnormality on the chest x-ray. On the sagittal reformatted  images this has a more linear elongated appearance. It could be scarring or atelectasis. It does appear to be new since the prior chest CT. Basilar scarring changes are also noted but no worrisome pulmonary lesions or pulmonary nodules. No edema or bronchiectasis. Upper Abdomen: The upper abdomen is unremarkable. Advanced atherosclerotic calcifications involving the abdominal aorta. Musculoskeletal: No chest wall mass is identified. No breast masses. Scattered axillary lymph nodes appears stable. Severe osteoporosis and markedly exaggerated thoracic kyphosis. Suspect changes of  ankylosing spondylitis. IMPRESSION: 1. Slightly irregular nodular density in the right upper lobe along the major fissure measuring a maximum of 16 x 8 mm on the axial images. It appears more elongated and linear on the sagittal images and may be scarring or atelectasis. A follow-up noncontrast chest CT in 3-4 months is suggested. 2. Stable mild emphysematous changes and pulmonary scarring. 3. Stable advanced atherosclerotic calcifications involving the thoracic and abdominal aorta and branch vessels including extensive three-vessel coronary artery calcifications. 4. No mediastinal or hilar mass or adenopathy. 5. Suspect changes of ankylosing spondylitis. Aortic Atherosclerosis (ICD10-I70.0) and Emphysema (ICD10-J43.9). Electronically Signed   By: Marijo Sanes M.D.   On: 09/07/2018 10:13   Dg Chest Portable 1 View  Result Date: 09/06/2018 CLINICAL DATA:  Chest pain EXAM: PORTABLE CHEST 1 VIEW COMPARISON:  06/24/2017, CT 05/23/2017 FINDINGS: No pleural effusion or pneumothorax. Stable mild cardiomegaly with aortic atherosclerosis. Possible right upper lobe lung nodule. IMPRESSION: 1. No radiographic evidence for acute cardiopulmonary abnormality. 2. Possible right upper lobe lung nodule. Further evaluation with chest CT suggested 3. Mild cardiomegaly Electronically Signed   By: Donavan Foil M.D.   On: 09/06/2018 21:29     Microbiology: No results found for this or any previous visit (from the past 240 hour(s)).   Labs: Basic Metabolic Panel: Recent Labs  Lab 09/06/18 2045 09/07/18 0810  NA 136 141  K 2.9* 3.9  CL 99 106  CO2 26 27  GLUCOSE 112* 92  BUN 13 13  CREATININE 0.75 0.72  CALCIUM 9.1 9.0  MG 2.0  --   PHOS 3.5  --    Liver Function Tests: Recent Labs  Lab 09/06/18 2045  AST 19  ALT 13  ALKPHOS 96  BILITOT 0.6  PROT 7.4  ALBUMIN 3.7   No results for input(s): LIPASE, AMYLASE in the last 168 hours. No results for input(s): AMMONIA in the last 168 hours. CBC: Recent  Labs  Lab 09/06/18 2045  WBC 11.9*  NEUTROABS 8.8*  HGB 12.0  HCT 38.7  MCV 90.6  PLT 247   Cardiac Enzymes: Recent Labs  Lab 09/07/18 0214 09/07/18 0810  TROPONINI <0.03 <0.03   BNP: Invalid input(s): POCBNP CBG: No results for input(s): GLUCAP in the last 168 hours.  Time coordinating discharge:  36 minutes  Signed:  Orson Eva, DO [facility] Pager: [phone] 09/07/2018, 2:44 PM

## 2018-09-07 NOTE — Progress Notes (Signed)
Pt discharged from facility with stable vital signs and no complaints of pain. Discharge instructions were gone over with patient and daughter they both verbalized understanding.   Jeris Penta, RN

## 2018-09-07 NOTE — Care Management Obs Status (Signed)
[facility] NOTIFICATION   Patient Details  Name: BRIGETTE HOPFER MRN: [id number] Date of Birth: 06/12/1931   Medicare Observation Status Notification Given:  Yes    Sherald Barge, RN 09/07/2018, 10:41 AM

## 2018-09-07 NOTE — Consult Note (Signed)
Cardiology Consultation:   Patient ID: Hannah Mckee; [id number]; 1931/09/04   Admit date: 09/06/2018 Date of Consult: 09/07/2018  Primary Care Provider: Sinda Du, MD Primary Cardiologist: Dr. Carlyle Dolly   Patient Profile:   Hannah Mckee is an 84 y.o. female with a history of cardiomyopathy with normalization of LVEF on medical therapy as of June 2018, essential hypertension, COPD, and coronary artery calcifications by chest CT who is being seen today for the evaluation of chest pain at the request of Dr. Carles Collet.  History of Present Illness:   Hannah Mckee presents to the hospital after developing right-sided jaw pain while watching television.  She states that this symptom became more intense, waxing and waning pattern, ultimately radiated down her neck into the chest.  She also felt 3 left-sided episodes of chest tightness and came to the ER for evaluation.  She states that she took nitroglycerin in the ER, but her symptoms had already resolved prior to this.  She has had no recurrence under observation.  Her cardiac history includes cardiomyopathy with LVEF 35 to 40% as of 2017, although normalization of LVEF on medical therapy by follow-up echocardiogram in June of last year.  Previous ischemic work-up was declined and she has been managed medically.  She reports compliance with her medications, except that she has not been taking her potassium regularly.  She has chronic right-sided leg swelling, uses compression stockings.  Past Medical History:  Diagnosis Date  . COPD (chronic obstructive pulmonary disease) ([place])   . Coronary artery calcification seen on CT scan    Multivessel  . History of cardiomyopathy    a. EF 35-40% by echo in 10/2016 b. improved to 55-60% by echo in 04/2017  . History of shingles   . Hypertension   . Kidney stones   . Osteoarthritis   . Salivary gland stone   . Sepsis [facility])     Past Surgical History:  Procedure Laterality Date  .  BUNIONECTOMY    . CATARACT EXTRACTION Bilateral   . CHOLECYSTECTOMY    . COLONOSCOPY  11/16/2012   Procedure: COLONOSCOPY;  Surgeon: Rogene Houston, MD;  [facility];  Service: Endoscopy;  Laterality: N/A;  100  . LITHOTRIPSY    . ORIF PERIPROSTHETIC FRACTURE Right 10/29/2014   Procedure: OPEN REDUCTION INTERNAL FIXATION (ORIF) PERIPROSTHETIC FEMUR FRACTURE/FEMUR SHAFT;  Surgeon: Mcarthur Rossetti, MD;  Location: [place];  Service: Orthopedics;  Laterality: Right;  . TONSILLECTOMY    . TOTAL HIP ARTHROPLASTY     1989     Inpatient Medications: Scheduled Meds: . amLODipine  2.5 mg Oral Daily  . aspirin EC  81 mg Oral Daily  . furosemide  40 mg Oral Daily  . gabapentin  200 mg Oral BID  . heparin  5,000 Units Subcutaneous Q8H  . metoprolol succinate  75 mg Oral Daily  .  morphine injection  2 mg Intravenous Once  . potassium chloride SA  40 mEq Oral Daily  . umeclidinium-vilanterol  1 puff Inhalation QHS   Continuous Infusions: . sodium chloride Stopped (09/07/18 0134)   PRN Meds: sodium chloride, acetaminophen, albuterol, clonazePAM, nitroGLYCERIN, ondansetron (ZOFRAN) IV, oxyCODONE-acetaminophen  Allergies:    Allergies  Allergen Reactions  . Erythromycin Swelling  . Levaquin [Levofloxacin] Other (See Comments)    Tongue turns red and sore  . Penicillins Swelling    Has patient had a PCN reaction causing immediate rash, facial/tongue/throat swelling, SOB or lightheadedness with hypotension: Yes Has patient had  a PCN reaction causing severe rash involving mucus membranes or skin necrosis: No Has patient had a PCN reaction that required hospitalization No Has patient had a PCN reaction occurring within the last 10 years: No If all of the above answers are "NO", then may proceed with Cephalosporin use.   . Sulfa Antibiotics Other (See Comments)    Tongue turns red and sore    Social History:   Social History   Socioeconomic History  . Marital status:  Divorced    Spouse name: Not on file  . Number of children: Not on file  . Years of education: Not on file  . Highest education level: Not on file  Occupational History  . Not on file  Social Needs  . Financial resource strain: Not on file  . Food insecurity:    Worry: Not on file    Inability: Not on file  . Transportation needs:    Medical: Not on file    Non-medical: Not on file  Tobacco Use  . Smoking status: Former Smoker    Packs/day: 0.50    Years: 35.00    Pack years: 17.50    Types: Cigarettes    Last attempt to quit: 01/1997    Years since quitting: 21.6  . Smokeless tobacco: Never Used  Substance and Sexual Activity  . Alcohol use: No  . Drug use: No  . Sexual activity: Not on file  Lifestyle  . Physical activity:    Days per week: Not on file    Minutes per session: Not on file  . Stress: Not on file  Relationships  . Social connections:    Talks on phone: Not on file    Gets together: Not on file    Attends religious service: Not on file    Active member of club or organization: Not on file    Attends meetings of clubs or organizations: Not on file    Relationship status: Not on file  . Intimate partner violence:    Fear of current or ex partner: Not on file    Emotionally abused: Not on file    Physically abused: Not on file    Forced sexual activity: Not on file  Other Topics Concern  . Not on file  Social History Narrative  . Not on file    Family History:   The patient's family history includes Hypertension in her sister.  ROS:  Please see the history of present illness.  Chronic leg swelling.  All other ROS reviewed and negative.     Physical Exam/Data:   Vitals:   09/07/18 0000 09/07/18 0022 09/07/18 0218 09/07/18 0420  BP:  (!) 161/48  (!) 152/49  Pulse:  73  77  Resp:  18  18  Temp:  97.8 F (36.6 C)  (!) 97.4 F (36.3 C)  TempSrc:  Oral  Oral  SpO2:  96% 98% 98%  Weight: 54.1 kg     Height: 4\' 7"  (1.397 m)        Intake/Output Summary (Last 24 hours) at 09/07/2018 1059 Last data filed at 09/07/2018 0328 Gross per 24 hour  Intake 55.84 ml  Output -  Net 55.84 ml   Filed Weights   09/06/18 2038 09/07/18 0000  Weight: 54 kg 54.1 kg   Body mass index is 27.72 kg/m.   Gen: Elderly woman, appears comfortable at rest. HEENT: Conjunctiva and lids normal, oropharynx clear with moist mucosa. Neck: Supple, no elevated JVP or carotid bruits,  no thyromegaly. Lungs: Clear to auscultation, nonlabored breathing at rest. Cardiac: Regular rate and rhythm, no S3 or significant systolic murmur, no pericardial rub. Abdomen: Soft, nontender, bowel sounds present, no guarding or rebound. Extremities: Right leg edema chronic per patient, distal pulses 2+. Skin: Warm and dry. Musculoskeletal: No kyphosis. Neuropsychiatric: Alert and oriented x3, affect grossly appropriate.  EKG:  I personally reviewed the tracing from 09/07/2018 which shows normal sinus rhythm.  Telemetry:  I personally reviewed telemetry which shows sinus rhythm.  Relevant CV Studies:  Echocardiogram 04/07/2017: Study Conclusions  - Left ventricle: The cavity size was normal. Wall thickness was   normal. Systolic function was normal. The estimated ejection   fraction was in the range of 55% to 60%. Doppler parameters are   consistent with abnormal left ventricular relaxation (grade 1   diastolic dysfunction). Doppler parameters are consistent with   high ventricular filling pressure. - Aortic valve: Mildly calcified annulus. Trileaflet; normal   thickness leaflets. There was mild regurgitation. Valve area   (VTI): 1.98 cm^2. Valve area (Vmax): 2.1 cm^2. Valve area   (Vmean): 2.03 cm^2. - Mitral valve: Mildly calcified annulus. Normal thickness leaflets   . There was mild regurgitation. - Left atrium: The atrium was moderately dilated. - Right atrium: The atrium was mildly dilated. - Pulmonary arteries: Systolic pressure was mildly  increased. PA   peak pressure: 38 mm Hg (S). - Technically adequate study.  Laboratory Data:  Chemistry Recent Labs  Lab 09/06/18 2045 09/07/18 0810  NA 136 141  K 2.9* 3.9  CL 99 106  CO2 26 27  GLUCOSE 112* 92  BUN 13 13  CREATININE 0.75 0.72  CALCIUM 9.1 9.0  GFRNONAA >60 >60  GFRAA >60 >60  ANIONGAP 11 8    Recent Labs  Lab 09/06/18 2045  PROT 7.4  ALBUMIN 3.7  AST 19  ALT 13  ALKPHOS 96  BILITOT 0.6   Hematology Recent Labs  Lab 09/06/18 2045  WBC 11.9*  RBC 4.27  HGB 12.0  HCT 38.7  MCV 90.6  MCH 28.1  MCHC 31.0  RDW 14.2  PLT 247   Cardiac Enzymes Recent Labs  Lab 09/07/18 0214 09/07/18 0810  TROPONINI <0.03 <0.03    Recent Labs  Lab 09/06/18 2053  TROPIPOC 0.00    BNP Recent Labs  Lab 09/06/18 2045  BNP 202.0*     Radiology/Studies:  Ct Chest Wo Contrast  Result Date: 09/07/2018 CLINICAL DATA:  Evaluate pulmonary nodule seen on recent chest x-ray. EXAM: CT CHEST WITHOUT CONTRAST TECHNIQUE: Multidetector CT imaging of the chest was performed following the standard protocol without IV contrast. COMPARISON:  Chest x-ray 09/06/2018 and chest CT 05/23/2017 FINDINGS: Cardiovascular: The heart is normal in size for age. Moderate enlargement of the left atrium. No pericardial effusion. Advanced atherosclerotic calcifications involving the thoracic aorta and three-vessel coronary artery calcifications. Mediastinum/Nodes: No mediastinal or hilar mass or lymphadenopathy. Calcified right hilar lymph nodes are noted. These are stable. The esophagus is grossly normal. Lungs/Pleura: Nodular thickening along the major fissure in the right upper lobe correlates with the abnormality on the chest x-ray. On the sagittal reformatted images this has a more linear elongated appearance. It could be scarring or atelectasis. It does appear to be new since the prior chest CT. Basilar scarring changes are also noted but no worrisome pulmonary lesions or pulmonary  nodules. No edema or bronchiectasis. Upper Abdomen: The upper abdomen is unremarkable. Advanced atherosclerotic calcifications involving the abdominal aorta. Musculoskeletal: No chest wall  mass is identified. No breast masses. Scattered axillary lymph nodes appears stable. Severe osteoporosis and markedly exaggerated thoracic kyphosis. Suspect changes of ankylosing spondylitis. IMPRESSION: 1. Slightly irregular nodular density in the right upper lobe along the major fissure measuring a maximum of 16 x 8 mm on the axial images. It appears more elongated and linear on the sagittal images and may be scarring or atelectasis. A follow-up noncontrast chest CT in 3-4 months is suggested. 2. Stable mild emphysematous changes and pulmonary scarring. 3. Stable advanced atherosclerotic calcifications involving the thoracic and abdominal aorta and branch vessels including extensive three-vessel coronary artery calcifications. 4. No mediastinal or hilar mass or adenopathy. 5. Suspect changes of ankylosing spondylitis. Aortic Atherosclerosis (ICD10-I70.0) and Emphysema (ICD10-J43.9). Electronically Signed   By: Marijo Sanes M.D.   On: 09/07/2018 10:13   Dg Chest Portable 1 View  Result Date: 09/06/2018 CLINICAL DATA:  Chest pain EXAM: PORTABLE CHEST 1 VIEW COMPARISON:  06/24/2017, CT 05/23/2017 FINDINGS: No pleural effusion or pneumothorax. Stable mild cardiomegaly with aortic atherosclerosis. Possible right upper lobe lung nodule. IMPRESSION: 1. No radiographic evidence for acute cardiopulmonary abnormality. 2. Possible right upper lobe lung nodule. Further evaluation with chest CT suggested 3. Mild cardiomegaly Electronically Signed   By: Donavan Foil M.D.   On: 09/06/2018 21:29    Assessment and Plan:   1.  Presentation with jaw and chest discomfort.  Possibly anginal based on description, although no evidence of ACS by cardiac enzymes.  2.  Multivessel coronary artery calcifications by chest CT imaging.  Previous  ischemic work-up was declined when her cardiomyopathy was initially diagnosed, overall she has done relatively well on medical therapy.  3.  History of cardiomyopathy with normalization of LVEF by echocardiogram in June 2018, 55 to 60% range at that time.  4.  Hypokalemia at presentation, patient admitted to missing potassium doses regularly.  She is on Lasix.  5.  Essential hypertension, on Toprol-XL and Norvasc.  Recent systolic blood pressures 212Y to 150s.  I reviewed records and discussed symptoms with the patient.  She appears comfortable today.  Follow-up echocardiogram is pending for reassessment of LVEF.  At this point would continue medical therapy if LVEF remains normal range.  Continue aspirin, Toprol-XL, increase Norvasc to 5 mg daily as this will have antianginal properties as well as provide better blood pressure control.  Suggest fasting lipid panel and consideration of statin particularly if LDL is not under 70 with documented atherosclerosis.  Would also give her a prescription for as needed nitroglycerin.  With continued use of Lasix she needs to be consistent with potassium supplementation.  No additional ischemic testing is planned at this time if LVEF remains normal.  She will need to have office follow-up with Dr. Harl Bowie or APP after discharge.   Signed, Rozann Lesches, MD  09/07/2018 10:59 AM

## 2018-09-11 DIAGNOSIS — M6281 Muscle weakness (generalized): Secondary | ICD-10-CM | POA: Diagnosis not present

## 2018-09-11 DIAGNOSIS — I5021 Acute systolic (congestive) heart failure: Secondary | ICD-10-CM | POA: Diagnosis not present

## 2018-09-13 DIAGNOSIS — E876 Hypokalemia: Secondary | ICD-10-CM | POA: Diagnosis not present

## 2018-09-13 DIAGNOSIS — I5032 Chronic diastolic (congestive) heart failure: Secondary | ICD-10-CM | POA: Diagnosis not present

## 2018-09-13 DIAGNOSIS — I10 Essential (primary) hypertension: Secondary | ICD-10-CM | POA: Diagnosis not present

## 2018-09-13 DIAGNOSIS — J449 Chronic obstructive pulmonary disease, unspecified: Secondary | ICD-10-CM | POA: Diagnosis not present

## 2018-09-13 DIAGNOSIS — Z23 Encounter for immunization: Secondary | ICD-10-CM | POA: Diagnosis not present

## 2018-10-03 ENCOUNTER — Telehealth: Payer: Self-pay | Admitting: Cardiology

## 2018-10-03 NOTE — Telephone Encounter (Signed)
Pt daughter was not willing to see extender or pcp - pt wants to resume regular dose of metoprolol 75 mg daily and stop amlodipine - has seen Dr Luan Pulling since ED visit (last week) and suggested she call cardiologist if symptoms worsen which she says they have and was concerned it was the changes made to the medications

## 2018-10-03 NOTE — Telephone Encounter (Signed)
Daughter called stating that her mother was discharged on 09-07-2018 from [facility]. Her medication was changed. States that she is fatigue and feels like her heart is racing.  [phone] Gertie Baron)

## 2018-10-03 NOTE — Telephone Encounter (Signed)
I would ask she begin her evaluation with her pcp, if they feel its heart related we can work to have her added to schedule   Zandra Abts MD

## 2018-10-03 NOTE — Telephone Encounter (Signed)
Pt says for the last 2 days increased SOB/weakness/palpitations and just "feels anxious" HR 80 today - recent medication changes with amlodipine 5 mg daily and decreased metoprolol 50 mg daily (ED 09/06/18)

## 2018-10-04 NOTE — Telephone Encounter (Signed)
Ok to go back to Toprol 75mg  daily and stop norvasc. Have them update [employer] on her symptoms later this week or early next week  [place] MD

## 2018-10-04 NOTE — Telephone Encounter (Signed)
Daughter called back and voiced understanding - will update [place] late this week

## 2018-10-04 NOTE — Telephone Encounter (Signed)
Left detailed message on daughters VM

## 2018-10-06 ENCOUNTER — Telehealth: Payer: Self-pay | Admitting: Cardiology

## 2018-10-06 NOTE — Telephone Encounter (Signed)
Pt says she feels much better - will contact us if symptoms return - f/u 11/13/18

## 2018-10-06 NOTE — Telephone Encounter (Signed)
Patient called stating that she told to call office in regards to her medication. She said that so far everything seems to be good.

## 2018-10-11 ENCOUNTER — Telehealth: Payer: Self-pay | Admitting: Cardiology

## 2018-10-11 DIAGNOSIS — M6281 Muscle weakness (generalized): Secondary | ICD-10-CM | POA: Diagnosis not present

## 2018-10-11 DIAGNOSIS — I5021 Acute systolic (congestive) heart failure: Secondary | ICD-10-CM | POA: Diagnosis not present

## 2018-10-11 NOTE — Telephone Encounter (Signed)
Discussed BP and HR with patient.  BP early this morning was 186/70's / did not know her HR.  Main c/o palpitations, making her feel anxious.  BP & HR check while on phone with patient - 182/70  101.  Stated that she is taking the Toprol XL 75mg  daily & did not stop the Norvasc - has continued that at 5mg  daily.  No c/o chest pain or sob.  Did c/o dizziness this morning earlier.  Stated she did not sleep well due to this.  Stated that she did have OV scheduled with her pmd for this Friday, but just cancelled it this morning because it is supposed to be freezing rain & she can't be out in that.  Patient also questioned if she could take a Nitroglycerin to bring her BP down.  Advised against this as she is home alone & would not want to have issue with BP dropping too low.  She verbalized understanding.  Message fwd to provider for further advice.

## 2018-10-11 NOTE — Telephone Encounter (Signed)
Can she increase her toprol to 100mg  daily ( 2 of the 50mg  tablets). Ok to continue the norvac 5mg  daily, I think that recent increase was fine. Update [place] on symptoms Friday. If alrady took Toprol today can take an extra 1/2 tablet now (25mg )   Zandra Abts MD

## 2018-10-11 NOTE — Telephone Encounter (Signed)
Patient is calling asking if she take a Nitro due to heart racing and her BP "sky high"

## 2018-10-11 NOTE — Telephone Encounter (Signed)
No answer

## 2018-10-12 NOTE — Telephone Encounter (Signed)
Patient is home now

## 2018-10-12 NOTE — Telephone Encounter (Signed)
Left message to return call on home number.  No answer on mobile.

## 2018-10-13 MED ORDER — METOPROLOL SUCCINATE ER 50 MG PO TB24: 100.0000 mg | ORAL_TABLET | Freq: Every day | ORAL | Status: DC

## 2018-10-13 NOTE — Telephone Encounter (Signed)
Daughter Hannah Mckee) & patient notified & verbalized understanding.

## 2018-10-18 ENCOUNTER — Telehealth: Payer: Self-pay | Admitting: Cardiology

## 2018-10-18 NOTE — Telephone Encounter (Signed)
Patient called to report recent blood pressure readings.  10/14/18  133/58 P 86 10/15/18  148/61 P 78 10/16/18  130/57 P 78 10/17/18  140/52 P 84 10/18/18  134.53 P 77

## 2018-10-18 NOTE — Telephone Encounter (Signed)
BP's and heart rates look good   Zandra Abts MD

## 2018-10-18 NOTE — Telephone Encounter (Signed)
Patient notified and verbalized understanding. 

## 2018-10-18 NOTE — Telephone Encounter (Signed)
Fwd to Dr. Harl Bowie for his review.

## 2018-11-11 DIAGNOSIS — I5021 Acute systolic (congestive) heart failure: Secondary | ICD-10-CM | POA: Diagnosis not present

## 2018-11-11 DIAGNOSIS — M6281 Muscle weakness (generalized): Secondary | ICD-10-CM | POA: Diagnosis not present

## 2018-11-13 ENCOUNTER — Encounter: Payer: Self-pay | Admitting: Cardiology

## 2018-11-13 ENCOUNTER — Ambulatory Visit: Payer: Medicare Other | Admitting: Cardiology

## 2018-11-13 ENCOUNTER — Encounter: Payer: Self-pay | Admitting: *Deleted

## 2018-11-13 ENCOUNTER — Telehealth: Payer: Self-pay | Admitting: Cardiology

## 2018-11-13 VITALS — BP 144/77 | HR 84 | Ht <= 58 in | Wt 119.4 lb

## 2018-11-13 DIAGNOSIS — R002 Palpitations: Secondary | ICD-10-CM | POA: Diagnosis not present

## 2018-11-13 DIAGNOSIS — R079 Chest pain, unspecified: Secondary | ICD-10-CM

## 2018-11-13 DIAGNOSIS — E876 Hypokalemia: Secondary | ICD-10-CM | POA: Diagnosis not present

## 2018-11-13 DIAGNOSIS — I5022 Chronic systolic (congestive) heart failure: Secondary | ICD-10-CM | POA: Diagnosis not present

## 2018-11-13 NOTE — Patient Instructions (Addendum)
Medication Instructions:   Your physician recommends that you continue on your current medications as directed. Please refer to the Current Medication list given to you today.  Labwork:  Your physician recommends that you return for lab work in: to check your BMET & Mg levels  Testing/Procedures: Your physician has requested that you have a lexiscan myoview. For further information please visit [email or web address]. Please follow instruction sheet, as given.  Follow-Up:  Your physician recommends that you schedule a follow-up appointment in: 3 months.  Any Other Special Instructions Will Be Listed Below (If Applicable).  If you need a refill on your cardiac medications before your next appointment, please call your pharmacy.

## 2018-11-13 NOTE — Telephone Encounter (Signed)
°  Precert needed for: Lexiscan Myoview on medications dx: chest pain   Location: [facility]    Date: Nov 22, 2018

## 2018-11-13 NOTE — Progress Notes (Signed)
Clinical Summary Ms. Badolato is a 85 y.o.female seen today for follow up of the following medical problems.    1. Chronic systolic HF - echo 37/1696 LVEF 35-40%. Familyasked to deferischemic testing at that time. -f/u echo after medical therapy showed normalization of LVEF.04/2017 echo LVEF 78-93%, grade I diastolic dysfunction. Mild AI. - lisionpril caused rash.   09/2018 LVEF 81-01%, grade I diastolic dysfunction.  - no recent edema.  2. COPD - followed by Dr Luan Pulling   3. HTN - home SBPs in 130s - compliant with meds  4. Chest pain - sharp,dull, pressure pain. Left sided. Mainly occurs at rest. Gets anxious. Has noticed SOB, lasts 10-15 seconds. Variable in frequency SOB at 25 steps.   5. Palpitations - long history. Prior EKGs have shown PACs - on beta blocker Past Medical History:  Diagnosis Date  . COPD (chronic obstructive pulmonary disease) ([place])   . Coronary artery calcification seen on CT scan    Multivessel  . History of cardiomyopathy    a. EF 35-40% by echo in 10/2016 b. improved to 55-60% by echo in 04/2017  . History of shingles   . Hypertension   . Kidney stones   . Osteoarthritis   . Salivary gland stone   . Sepsis ([place])      Allergies  Allergen Reactions  . Erythromycin Swelling  . Levaquin [Levofloxacin] Other (See Comments)    Tongue turns red and sore  . Penicillins Swelling    Has patient had a PCN reaction causing immediate rash, facial/tongue/throat swelling, SOB or lightheadedness with hypotension: Yes Has patient had a PCN reaction causing severe rash involving mucus membranes or skin necrosis: No Has patient had a PCN reaction that required hospitalization No Has patient had a PCN reaction occurring within the last 10 years: No If all of the above answers are "NO", then may proceed with Cephalosporin use.   . Sulfa Antibiotics Other (See Comments)    Tongue turns red and sore     Current Outpatient  Medications  Medication Sig Dispense Refill  . amLODipine (NORVASC) 5 MG tablet Take 1 tablet (5 mg total) by mouth daily. 30 tablet 1  . ANORO ELLIPTA 62.5-25 MCG/INH AEPB Inhale 1 puff into the lungs at bedtime.     Marland Kitchen aspirin EC 81 MG tablet Take 81 mg by mouth daily.    . clonazePAM (KLONOPIN) 0.5 MG tablet Take 0.125 mg by mouth daily as needed for anxiety.     . feeding supplement, ENSURE ENLIVE, (ENSURE ENLIVE) LIQD Take 237 mLs by mouth 2 (two) times daily between meals. (Patient taking differently: Take 237 mLs by mouth daily. ) 237 mL 12  . furosemide (LASIX) 40 MG tablet Take 1 tablet (40 mg total) by mouth daily. (May take an extra 1/2 tab as needed for severe swelling.) 45 tablet 6  . gabapentin (NEURONTIN) 100 MG capsule Take 200 mg by mouth 2 (two) times daily.  5  . metoprolol succinate (TOPROL-XL) 50 MG 24 hr tablet Take 2 tablets (100 mg total) by mouth daily.    . nitroGLYCERIN (NITROSTAT) 0.4 MG SL tablet Place 1 tablet (0.4 mg total) under the tongue every 5 (five) minutes as needed for chest pain (CP or SOB). 30 tablet 1  . oxyCODONE-acetaminophen (PERCOCET/ROXICET) 5-325 MG tablet Take 0.5 tablets by mouth 4 (four) times daily as needed for moderate pain or severe pain.   0  . potassium chloride SA (K-DUR,KLOR-CON) 20 MEQ tablet Take 1  tablet (20 mEq total) by mouth 3 (three) times a week. Takes only when Furosemide is taken (Patient taking differently: Take 20 mEq by mouth daily. Takes only when Furosemide is taken) 30 tablet 3  . PROAIR HFA 108 (90 Base) MCG/ACT inhaler Inhale 1 puff into the lungs every 6 (six) hours as needed for wheezing or shortness of breath.   11   No current facility-administered medications for this visit.      Past Surgical History:  Procedure Laterality Date  . BUNIONECTOMY    . CATARACT EXTRACTION Bilateral   . CHOLECYSTECTOMY    . COLONOSCOPY  11/16/2012   Procedure: COLONOSCOPY;  Surgeon: Rogene Houston, MD;  [facility];   Service: Endoscopy;  Laterality: N/A;  100  . LITHOTRIPSY    . ORIF PERIPROSTHETIC FRACTURE Right 10/29/2014   Procedure: OPEN REDUCTION INTERNAL FIXATION (ORIF) PERIPROSTHETIC FEMUR FRACTURE/FEMUR SHAFT;  Surgeon: Mcarthur Rossetti, MD;  Location: [facility];  Service: Orthopedics;  Laterality: Right;  . TONSILLECTOMY    . TOTAL HIP ARTHROPLASTY     1989     Allergies  Allergen Reactions  . Erythromycin Swelling  . Levaquin [Levofloxacin] Other (See Comments)    Tongue turns red and sore  . Penicillins Swelling    Has patient had a PCN reaction causing immediate rash, facial/tongue/throat swelling, SOB or lightheadedness with hypotension: Yes Has patient had a PCN reaction causing severe rash involving mucus membranes or skin necrosis: No Has patient had a PCN reaction that required hospitalization No Has patient had a PCN reaction occurring within the last 10 years: No If all of the above answers are "NO", then may proceed with Cephalosporin use.   . Sulfa Antibiotics Other (See Comments)    Tongue turns red and sore      Family History  Problem Relation Age of Onset  . Hypertension Sister      Social History Ms. Bruyere reports that she quit smoking about 21 years ago. Her smoking use included cigarettes. She has a 17.50 pack-year smoking history. She has never used smokeless tobacco. Ms. Steinhart reports no history of alcohol use.   Review of Systems CONSTITUTIONAL: No weight loss, fever, chills, weakness or fatigue.  HEENT: Eyes: No visual loss, blurred vision, double vision or yellow sclerae.No hearing loss, sneezing, congestion, runny nose or sore throat.  SKIN: No rash or itching.  CARDIOVASCULAR: per hpi RESPIRATORY: per hpi GASTROINTESTINAL: No anorexia, nausea, vomiting or diarrhea. No abdominal pain or blood.  GENITOURINARY: No burning on urination, no polyuria NEUROLOGICAL: No headache, dizziness, syncope, paralysis, ataxia, numbness or tingling in the  extremities. No change in bowel or bladder control.  MUSCULOSKELETAL: No muscle, back pain, joint pain or stiffness.  LYMPHATICS: No enlarged nodes. No history of splenectomy.  PSYCHIATRIC: No history of depression or anxiety.  ENDOCRINOLOGIC: No reports of sweating, cold or heat intolerance. No polyuria or polydipsia.  Marland Kitchen   Physical Examination Vitals:   11/13/18 1104  BP: (!) 144/77  Pulse: 84  SpO2: 97%   Vitals:   11/13/18 1104  Weight: 119 lb 6.4 oz (54.2 kg)  Height: 4\' 7"  (1.397 m)    Gen: resting comfortably, no acute distress HEENT: no scleral icterus, pupils equal round and reactive, no palptable cervical adenopathy,  CV Resp: Clear to auscultation bilaterally GI: abdomen is soft, non-tender, non-distended, normal bowel sounds, no hepatosplenomegaly MSK: extremities are warm, no edema.  Skin: warm, no rash Neuro:  no focal deficits Psych: appropriate affect  Diagnostic Studies     Assessment and Plan  1.Chronic systolic HF -LVEF normalized with medical therapy -continue current meds  2. Chest pain - unclear etiology. She had previously been resistant to ischemic testing but is open at this time - plan for lexiscan, would only consider cath if very high risk findings.   3. HTN - overall at goal based on home numebrs, continue current meds  4. Palpitations - continue beta blocker  5. Hypokalemia - noted during 09/2018 - repeat BMET and Mg  F/u 3 months        Arnoldo Lenis, M.D.

## 2018-11-22 ENCOUNTER — Encounter (HOSPITAL_BASED_OUTPATIENT_CLINIC_OR_DEPARTMENT_OTHER)
Admission: RE | Admit: 2018-11-22 | Discharge: 2018-11-22 | Disposition: A | Discharge status: Home / Self Care | Payer: Medicare Other | Source: Ambulatory Visit | Attending: Cardiology | Admitting: Cardiology

## 2018-11-22 ENCOUNTER — Encounter (HOSPITAL_COMMUNITY): Payer: Self-pay

## 2018-11-22 ENCOUNTER — Encounter (HOSPITAL_COMMUNITY)
Admission: RE | Admit: 2018-11-22 | Discharge: 2018-11-22 | Disposition: A | Discharge status: Home / Self Care | Payer: Medicare Other | Source: Ambulatory Visit | Attending: Cardiology | Admitting: Cardiology

## 2018-11-22 DIAGNOSIS — I5032 Chronic diastolic (congestive) heart failure: Secondary | ICD-10-CM | POA: Diagnosis not present

## 2018-11-22 DIAGNOSIS — I5022 Chronic systolic (congestive) heart failure: Secondary | ICD-10-CM | POA: Diagnosis not present

## 2018-11-22 DIAGNOSIS — R079 Chest pain, unspecified: Secondary | ICD-10-CM | POA: Diagnosis not present

## 2018-11-22 HISTORY — DX: Malignant (primary) neoplasm, unspecified: C80.1

## 2018-11-22 LAB — NM MYOCAR MULTI W/SPECT W/WALL MOTION / EF
LV dias vol: 57 mL (ref 46–106)
LV sys vol: 19 mL
Peak HR: 93 {beats}/min
RATE: 0.41
Rest HR: 72 {beats}/min
SDS: 11
SRS: 11
SSS: 22
TID: 1

## 2018-11-22 MED ORDER — TECHNETIUM TC 99M TETROFOSMIN IV KIT
10.0000 | PACK | Freq: Once | INTRAVENOUS | Status: AC | PRN
  Administered 2018-11-22: 9.9 via INTRAVENOUS

## 2018-11-22 MED ORDER — TECHNETIUM TC 99M TETROFOSMIN IV KIT
30.0000 | PACK | Freq: Once | INTRAVENOUS | Status: AC | PRN
  Administered 2018-11-22: 30.8 via INTRAVENOUS

## 2018-11-22 MED ORDER — SODIUM CHLORIDE 0.9% FLUSH
INTRAVENOUS | Status: AC
  Administered 2018-11-22: 10 mL via INTRAVENOUS
  Filled 2018-11-22: qty 10

## 2018-11-22 MED ORDER — REGADENOSON 0.4 MG/5ML IV SOLN
INTRAVENOUS | Status: AC
  Administered 2018-11-22: 0.4 mg via INTRAVENOUS
  Filled 2018-11-22: qty 5

## 2018-11-24 ENCOUNTER — Telehealth: Payer: Self-pay | Admitting: *Deleted

## 2018-11-24 MED ORDER — POTASSIUM CHLORIDE CRYS ER 20 MEQ PO TBCR: 20.0000 meq | EXTENDED_RELEASE_TABLET | Freq: Every day | ORAL | 3 refills | Status: DC

## 2018-11-24 NOTE — Telephone Encounter (Signed)
Patient informed and reports that she does miss potassium some days but will start taking one daily. Verbalized understanding of plan.

## 2018-11-24 NOTE — Telephone Encounter (Signed)
-----   Message from Arnoldo Lenis, MD sent at 11/24/2018 10:56 AM EST ----- Stress test shows a moderate blockaged on the bottom of her heart. Overall this is something we can continue to try to treat with medications, however if symptoms continue or progress we may have to discuss a possible cath again if she is open to it.   Hannah Abts MD

## 2018-11-24 NOTE — Telephone Encounter (Signed)
-----   Message from Arnoldo Lenis, MD sent at 11/24/2018 10:54 AM EST ----- Potassium is low. She will need to take her potassium at home more regularly. Verify how she has been taking it, from last notes just taking on days she takes her lasix. I think she will need 37mEq every of KCl   Zandra Abts MD

## 2018-12-12 DIAGNOSIS — I5021 Acute systolic (congestive) heart failure: Secondary | ICD-10-CM | POA: Diagnosis not present

## 2018-12-12 DIAGNOSIS — M6281 Muscle weakness (generalized): Secondary | ICD-10-CM | POA: Diagnosis not present

## 2018-12-26 ENCOUNTER — Ambulatory Visit: Payer: Medicare Other | Admitting: Cardiology

## 2019-01-10 DIAGNOSIS — Z96641 Presence of right artificial hip joint: Secondary | ICD-10-CM | POA: Diagnosis not present

## 2019-01-10 DIAGNOSIS — I5021 Acute systolic (congestive) heart failure: Secondary | ICD-10-CM | POA: Diagnosis not present

## 2019-01-10 DIAGNOSIS — J449 Chronic obstructive pulmonary disease, unspecified: Secondary | ICD-10-CM | POA: Diagnosis not present

## 2019-01-10 DIAGNOSIS — I10 Essential (primary) hypertension: Secondary | ICD-10-CM | POA: Diagnosis not present

## 2019-02-10 DIAGNOSIS — Z96641 Presence of right artificial hip joint: Secondary | ICD-10-CM | POA: Diagnosis not present

## 2019-02-10 DIAGNOSIS — I10 Essential (primary) hypertension: Secondary | ICD-10-CM | POA: Diagnosis not present

## 2019-02-10 DIAGNOSIS — I5021 Acute systolic (congestive) heart failure: Secondary | ICD-10-CM | POA: Diagnosis not present

## 2019-02-10 DIAGNOSIS — J449 Chronic obstructive pulmonary disease, unspecified: Secondary | ICD-10-CM | POA: Diagnosis not present

## 2019-02-22 ENCOUNTER — Telehealth: Payer: Self-pay | Admitting: Cardiology

## 2019-02-22 NOTE — Telephone Encounter (Signed)
Virtual Visit Pre-Appointment Phone Call  "(Name), I am calling you today to discuss your upcoming appointment. We are currently trying to limit exposure to the virus that causes COVID-19 by seeing patients at home rather than in the office."  1. "What is the BEST phone number to call the day of the visit?" - include this in appointment notes  2. Do you have or have access to (through a family member/friend) a smartphone with video capability that we can use for your visit?" a. If yes - list this number in appt notes as cell (if different from BEST phone #) and list the appointment type as a VIDEO visit in appointment notes b. If no - list the appointment type as a PHONE visit in appointment notes  3. Confirm consent - "In the setting of the current Covid19 crisis, you are scheduled for a (phone or video) visit with your provider on (date) at (time).  Just as we do with many in-office visits, in order for you to participate in this visit, we must obtain consent.  If you'd like, I can send this to your mychart (if signed up) or email for you to review.  Otherwise, I can obtain your verbal consent now.  All virtual visits are billed to your insurance company just like a normal visit would be.  By agreeing to a virtual visit, we'd like you to understand that the technology does not allow for your provider to perform an examination, and thus may limit your provider's ability to fully assess your condition. If your provider identifies any concerns that need to be evaluated in person, we will make arrangements to do so.  Finally, though the technology is pretty good, we cannot assure that it will always work on either your or our end, and in the setting of a video visit, we may have to convert it to a phone-only visit.  In either situation, we cannot ensure that we have a secure connection.  Are you willing to proceed?" STAFF: Did the patient verbally acknowledge consent to telehealth visit? Document  YES/NO here:  yes  4. Advise patient to be prepared - "Two hours prior to your appointment, go ahead and check your blood pressure, pulse, oxygen saturation, and your weight (if you have the equipment to check those) and write them all down. When your visit starts, your provider will ask you for this information. If you have an Apple Watch or Kardia device, please plan to have heart rate information ready on the day of your appointment. Please have a pen and paper handy nearby the day of the visit as well."  5. Give patient instructions for MyChart download to smartphone OR Doximity/Doxy.me as below if video visit (depending on what platform provider is using)  6. Inform patient they will receive a phone call 15 minutes prior to their appointment time (may be from unknown caller ID) so they should be prepared to answer    TELEPHONE CALL NOTE  Hannah Mckee has been deemed a candidate for a follow-up tele-health visit to limit community exposure during the Covid-19 pandemic. I spoke with the patient via phone to ensure availability of phone/video source, confirm preferred email & phone number, and discuss instructions and expectations.  I reminded Hannah Mckee to be prepared with any vital sign and/or heart rhythm information that could potentially be obtained via home monitoring, at the time of her visit. I reminded Hannah Mckee to expect a phone call prior  to her visit.  Hannah Mckee 02/22/2019 12:50 PM   INSTRUCTIONS FOR DOWNLOADING THE MYCHART APP TO SMARTPHONE  - The patient must first make sure to have activated MyChart and know their login information - If Apple, go to [employer] and type in MyChart in the search bar and download the app. If Android, ask patient to go to [employer] and type in [facility] in the search bar and download the app. The app is free but as with any other app downloads, their phone may require them to verify saved payment information or  Apple/Android password.  - The patient will need to then log into the app with their MyChart username and password, and select [facility] as their healthcare provider to link the account. When it is time for your visit, go to the MyChart app, find appointments, and click Begin Video Visit. Be sure to Select Allow for your device to access the Microphone and Camera for your visit. You will then be connected, and your provider will be with you shortly.  **If they have any issues connecting, or need assistance please contact MyChart service desk [phone])**  **If using a computer, in order to ensure the best quality for their visit they will need to use either of the following Internet Browsers: [employer], or Google Chrome**  IF USING DOXIMITY or DOXY.ME - The patient will receive a link just prior to their visit by text.     FULL LENGTH CONSENT FOR TELE-HEALTH VISIT   I hereby voluntarily request, consent and authorize [facility] and its employed or contracted physicians, physician assistants, nurse practitioners or other licensed health care professionals (the Practitioner), to provide me with telemedicine health care services (the Services") as deemed necessary by the treating Practitioner. I acknowledge and consent to receive the Services by the Practitioner via telemedicine. I understand that the telemedicine visit will involve communicating with the Practitioner through live audiovisual communication technology and the disclosure of certain medical information by electronic transmission. I acknowledge that I have been given the opportunity to request an in-person assessment or other available alternative prior to the telemedicine visit and am voluntarily participating in the telemedicine visit.  I understand that I have the right to withhold or withdraw my consent to the use of telemedicine in the course of my care at any time, without affecting my right to future care  or treatment, and that the Practitioner or I may terminate the telemedicine visit at any time. I understand that I have the right to inspect all information obtained and/or recorded in the course of the telemedicine visit and may receive copies of available information for a reasonable fee.  I understand that some of the potential risks of receiving the Services via telemedicine include:   Delay or interruption in medical evaluation due to technological equipment failure or disruption;  Information transmitted may not be sufficient (e.g. poor resolution of images) to allow for appropriate medical decision making by the Practitioner; and/or   In rare instances, security protocols could fail, causing a breach of personal health information.  Furthermore, I acknowledge that it is my responsibility to provide information about my medical history, conditions and care that is complete and accurate to the best of my ability. I acknowledge that Practitioner's advice, recommendations, and/or decision may be based on factors not within their control, such as incomplete or inaccurate data provided by me or distortions of diagnostic images or specimens that may result from electronic transmissions.  I understand that the practice of medicine is not an exact science and that Practitioner makes no warranties or guarantees regarding treatment outcomes. I acknowledge that I will receive a copy of this consent concurrently upon execution via email to the email address I last provided but may also request a printed copy by calling the office of [facility].    I understand that my insurance will be billed for this visit.   I have read or had this consent read to me.  I understand the contents of this consent, which adequately explains the benefits and risks of the Services being provided via telemedicine.   I have been provided ample opportunity to ask questions regarding this consent and the Services and have had  my questions answered to my satisfaction.  I give my informed consent for the services to be provided through the use of telemedicine in my medical care  By participating in this telemedicine visit I agree to the above.

## 2019-02-27 ENCOUNTER — Telehealth (INDEPENDENT_AMBULATORY_CARE_PROVIDER_SITE_OTHER): Payer: Medicare Other | Admitting: Cardiology

## 2019-02-27 ENCOUNTER — Encounter: Payer: Self-pay | Admitting: Cardiology

## 2019-02-27 VITALS — BP 139/52 | HR 78 | Ht <= 58 in | Wt 114.0 lb

## 2019-02-27 DIAGNOSIS — R002 Palpitations: Secondary | ICD-10-CM

## 2019-02-27 DIAGNOSIS — R0602 Shortness of breath: Secondary | ICD-10-CM

## 2019-02-27 DIAGNOSIS — R079 Chest pain, unspecified: Secondary | ICD-10-CM

## 2019-02-27 DIAGNOSIS — I5022 Chronic systolic (congestive) heart failure: Secondary | ICD-10-CM

## 2019-02-27 DIAGNOSIS — I10 Essential (primary) hypertension: Secondary | ICD-10-CM

## 2019-02-27 NOTE — Patient Instructions (Signed)
Your physician recommends that you schedule a follow-up appointment PENDING WITH DR [facility]  Your physician recommends that you continue on your current medications as directed. Please refer to the Current Medication list given to you today.   PLEASE CALL us IN 1 WEEK WITH AN UPDATE ON YOUR SHORTNESS OF BREATH AND SYMPTOMS  Thank you for choosing [facility]!!

## 2019-02-27 NOTE — Progress Notes (Signed)
Virtual Visit via Telephone Note   This visit type was conducted due to national recommendations for restrictions regarding the COVID-19 Pandemic (e.g. social distancing) in an effort to limit this patient's exposure and mitigate transmission in our community.  Due to her co-morbid illnesses, this patient is at least at moderate risk for complications without adequate follow up.  This format is felt to be most appropriate for this patient at this time.  The patient did not have access to video technology/had technical difficulties with video requiring transitioning to audio format only (telephone).  All issues noted in this document were discussed and addressed.  No physical exam could be performed with this format.  Please refer to the patient's chart for her  consent to telehealth for [facility].   Evaluation Performed:  Follow-up visit  Date:  02/27/2019   ID:  Hannah Mckee, DOB [birth year], MRN [id number]  Patient Location: Home Provider Location: Home  PCP:  Sinda Du, MD  Cardiologist:  Carlyle Dolly, MD  Electrophysiologist:  None   Chief Complaint:  3 month follow up  History of Present Illness:    Hannah Mckee is a 85 y.o. female  seen today for follow up of the following medical problems.    1. Chronic systolic HF - echo 54/5625 LVEF 35-40%. Familyasked to deferischemic testing at that time. -f/u echo after medical therapy showed normalization of LVEF.04/2017 echo LVEF 63-89%, grade I diastolic dysfunction. Mild AI. - lisionpril caused rash.   09/2018 LVEF 37-34%, grade I diastolic dysfunction.   - some SOB with activities, typically with higher exertion. +cough which started in February, nonproductive. Occasional wheezing. Some recent weight loss to 114 lbs. Decreased apettite. Does not use prn albuterol regularly.    2. COPD - followed by Dr Luan Pulling - some recent cough, SOB. Compliance with anoroa, has not used her prn albuteorl    3. HTN - compliant with meds  4. Chest pain - sharp,dull, pressure pain. Left sided. Mainly occurs at rest. Gets anxious. Has noticed SOB, lasts 10-15 seconds. Variable in frequency SOB at 25 steps.   - Jan 2020 nuclear stress: prior inferior/inferoseptal/septal myocardial infarction with moderate peri-infarct ischemia. - no recent chest pain.   5. Palpitations - long history. Prior EKGs have shown PACs - on beta blocker - no recent symptoms  The patient does not have symptoms concerning for COVID-19 infection (fever, chills, cough, or new shortness of breath).    Past Medical History:  Diagnosis Date  . Cancer (HCC)    Skin  . CHF (congestive heart failure) ([place])   . COPD (chronic obstructive pulmonary disease) ([place])   . Coronary artery calcification seen on CT scan    Multivessel  . History of cardiomyopathy    a. EF 35-40% by echo in 10/2016 b. improved to 55-60% by echo in 04/2017  . History of shingles   . Hypertension   . Kidney stones   . Osteoarthritis   . Salivary gland stone   . Sepsis [facility])    Past Surgical History:  Procedure Laterality Date  . BUNIONECTOMY    . CATARACT EXTRACTION Bilateral   . CHOLECYSTECTOMY    . COLONOSCOPY  11/16/2012   Procedure: COLONOSCOPY;  Surgeon: Rogene Houston, MD;  [facility];  Service: Endoscopy;  Laterality: N/A;  100  . LITHOTRIPSY    . ORIF PERIPROSTHETIC FRACTURE Right 10/29/2014   Procedure: OPEN REDUCTION INTERNAL FIXATION (ORIF) PERIPROSTHETIC FEMUR FRACTURE/FEMUR SHAFT;  Surgeon: Lind Guest  Ninfa Linden, MD;  Location: [facility];  Service: Orthopedics;  Laterality: Right;  . TONSILLECTOMY    . TOTAL HIP ARTHROPLASTY     1989     No outpatient medications have been marked as taking for the 02/27/19 encounter (Appointment) with Arnoldo Lenis, MD.     Allergies:   Erythromycin; Levaquin [levofloxacin]; Penicillins; and Sulfa antibiotics   Social History   Tobacco Use  . Smoking status: Former  Smoker    Packs/day: 0.50    Years: 35.00    Pack years: 17.50    Types: Cigarettes    Last attempt to quit: 01/1997    Years since quitting: 22.0  . Smokeless tobacco: Never Used  Substance Use Topics  . Alcohol use: No  . Drug use: No     Family Hx: The patient's family history includes Hypertension in her sister.  ROS:   Please see the history of present illness.     All other systems reviewed and are negative.   Prior CV studies:   The following studies were reviewed today:  Jan 2020 nuclear stress  There was no ST segment deviation noted during stress.  Findings consistent with prior inferior/inferoseptal/septal myocardial infarction with moderate peri-infarct ischemia.  This is an intermediate risk study.  The left ventricular ejection fraction is hyperdynamic (>65%).  Labs/Other Tests and Data Reviewed:    EKG:  na  Recent Labs: 09/06/2018: ALT 13; B Natriuretic Peptide 202.0; Hemoglobin 12.0; Magnesium 2.0; Platelets 247 09/07/2018: BUN 13; Creatinine, Ser 0.72; Potassium 3.9; Sodium 141   Recent Lipid Panel No results found for: CHOL, TRIG, HDL, CHOLHDL, LDLCALC, LDLDIRECT  Wt Readings from Last 3 Encounters:  11/13/18 119 lb 6.4 oz (54.2 kg)  09/07/18 119 lb 4.3 oz (54.1 kg)  06/12/18 121 lb (54.9 kg)     Objective:    Vital Signs:  p 78 bp 139/52 Wt 114 lbs  ASSESSMENT & PLAN:    1.Chronic systolic HF -recent SOB, unclear etiology. She has actually lost weight since our last visit making CHF less likely. With her recent cough I suspect COPD may be more likely - continue current meds. Continue consider trial of increase diuretic if breathing not improved with more regular inhaler use   2. Chest pain - recent moderate risk nuclear stress test - managing medically at this time, if refractory may have to consider cath - no recent chest pain. I think less likely ishcemia is the cause of her SOB  3. HTN -reasonable control, continue current  meds  4. Palpitations - no symptoms, continue current meds  5. COPD -recent SOb and cough, encouraged trial of he prn albuterol and update [place] next week   COVID-19 Education: The signs and symptoms of COVID-19 were discussed with the patient and how to seek care for testing (follow up with PCP or arrange E-visit).  The importance of social distancing was discussed today.  Time:   Today, I have spent 25 minutes with the patient with telehealth technology discussing the above problems.     Medication Adjustments/Labs and Tests Ordered: Current medicines are reviewed at length with the patient today.  Concerns regarding medicines are outlined above.   Tests Ordered: No orders of the defined types were placed in this encounter.   Medication Changes: No orders of the defined types were placed in this encounter.   Disposition:  Follow up pending, she is to call us next week to update [place] on SOB.   Signed, Carlyle Dolly, MD  02/27/2019 8:18 AM    [facility]

## 2019-03-06 ENCOUNTER — Telehealth: Payer: Self-pay | Admitting: Cardiology

## 2019-03-06 NOTE — Telephone Encounter (Signed)
Patient used prescription cough syrup from drug store and proair and barely coughs now.Also has coughed up a lot of phlegm .Feeels much better

## 2019-03-06 NOTE — Telephone Encounter (Signed)
Patient called to advise that her breathing is better. States that Dr. Harl Bowie wanted a follow up per phone.

## 2019-03-12 DIAGNOSIS — J449 Chronic obstructive pulmonary disease, unspecified: Secondary | ICD-10-CM | POA: Diagnosis not present

## 2019-03-12 DIAGNOSIS — Z96641 Presence of right artificial hip joint: Secondary | ICD-10-CM | POA: Diagnosis not present

## 2019-03-12 DIAGNOSIS — I5021 Acute systolic (congestive) heart failure: Secondary | ICD-10-CM | POA: Diagnosis not present

## 2019-03-12 DIAGNOSIS — I10 Essential (primary) hypertension: Secondary | ICD-10-CM | POA: Diagnosis not present

## 2019-04-04 ENCOUNTER — Other Ambulatory Visit: Payer: Self-pay | Admitting: *Deleted

## 2019-04-04 MED ORDER — METOPROLOL SUCCINATE ER 50 MG PO TB24: 100.0000 mg | ORAL_TABLET | Freq: Every day | ORAL | 3 refills | Status: DC

## 2019-04-12 DIAGNOSIS — Z96641 Presence of right artificial hip joint: Secondary | ICD-10-CM | POA: Diagnosis not present

## 2019-04-12 DIAGNOSIS — I10 Essential (primary) hypertension: Secondary | ICD-10-CM | POA: Diagnosis not present

## 2019-04-12 DIAGNOSIS — I5021 Acute systolic (congestive) heart failure: Secondary | ICD-10-CM | POA: Diagnosis not present

## 2019-04-12 DIAGNOSIS — J449 Chronic obstructive pulmonary disease, unspecified: Secondary | ICD-10-CM | POA: Diagnosis not present

## 2019-04-23 ENCOUNTER — Other Ambulatory Visit: Payer: Self-pay | Admitting: Cardiology

## 2019-05-12 DIAGNOSIS — I10 Essential (primary) hypertension: Secondary | ICD-10-CM | POA: Diagnosis not present

## 2019-05-12 DIAGNOSIS — J449 Chronic obstructive pulmonary disease, unspecified: Secondary | ICD-10-CM | POA: Diagnosis not present

## 2019-05-12 DIAGNOSIS — Z96641 Presence of right artificial hip joint: Secondary | ICD-10-CM | POA: Diagnosis not present

## 2019-05-12 DIAGNOSIS — I5021 Acute systolic (congestive) heart failure: Secondary | ICD-10-CM | POA: Diagnosis not present

## 2019-06-12 DIAGNOSIS — Z96641 Presence of right artificial hip joint: Secondary | ICD-10-CM | POA: Diagnosis not present

## 2019-06-12 DIAGNOSIS — I10 Essential (primary) hypertension: Secondary | ICD-10-CM | POA: Diagnosis not present

## 2019-06-12 DIAGNOSIS — I5021 Acute systolic (congestive) heart failure: Secondary | ICD-10-CM | POA: Diagnosis not present

## 2019-06-12 DIAGNOSIS — J449 Chronic obstructive pulmonary disease, unspecified: Secondary | ICD-10-CM | POA: Diagnosis not present

## 2019-06-25 ENCOUNTER — Other Ambulatory Visit: Payer: Self-pay | Admitting: Cardiology

## 2019-06-26 ENCOUNTER — Telehealth: Payer: Self-pay | Admitting: Cardiology

## 2019-06-26 MED ORDER — METOPROLOL SUCCINATE ER 100 MG PO TB24: 100.0000 mg | ORAL_TABLET | Freq: Every day | ORAL | 3 refills | Status: DC

## 2019-06-26 MED ORDER — METOPROLOL SUCCINATE ER 100 MG PO TB24: 100.0000 mg | ORAL_TABLET | Freq: Every day | ORAL | 2 refills | Status: DC

## 2019-06-26 NOTE — Telephone Encounter (Signed)
Daughter Baker Janus) questioning dose that her mother is supposed to be on.  Informed her that she is currently supposed to be doing Toprol XL 100mg  daily.  New rx sent to mail order for 90 day supply & 30 day supply sent to local pharm.  There was some confusion about the dose.

## 2019-06-26 NOTE — Telephone Encounter (Signed)
Please give pt's daughter Baker Janus a call concerning the pt's metoprolol succinate (TOPROL-XL) 50 MG 24 hr tablet [id number]   They're having trouble getting it filled and she's out of it.   Can be reached @ [phone]

## 2019-07-13 DIAGNOSIS — I5021 Acute systolic (congestive) heart failure: Secondary | ICD-10-CM | POA: Diagnosis not present

## 2019-07-13 DIAGNOSIS — I10 Essential (primary) hypertension: Secondary | ICD-10-CM | POA: Diagnosis not present

## 2019-07-13 DIAGNOSIS — Z96641 Presence of right artificial hip joint: Secondary | ICD-10-CM | POA: Diagnosis not present

## 2019-07-13 DIAGNOSIS — J449 Chronic obstructive pulmonary disease, unspecified: Secondary | ICD-10-CM | POA: Diagnosis not present

## 2019-08-08 ENCOUNTER — Other Ambulatory Visit: Payer: Self-pay | Admitting: Cardiology

## 2019-08-12 DIAGNOSIS — I5021 Acute systolic (congestive) heart failure: Secondary | ICD-10-CM | POA: Diagnosis not present

## 2019-08-12 DIAGNOSIS — J449 Chronic obstructive pulmonary disease, unspecified: Secondary | ICD-10-CM | POA: Diagnosis not present

## 2019-08-12 DIAGNOSIS — I10 Essential (primary) hypertension: Secondary | ICD-10-CM | POA: Diagnosis not present

## 2019-08-12 DIAGNOSIS — Z96641 Presence of right artificial hip joint: Secondary | ICD-10-CM | POA: Diagnosis not present

## 2019-08-15 DIAGNOSIS — L989 Disorder of the skin and subcutaneous tissue, unspecified: Secondary | ICD-10-CM | POA: Diagnosis not present

## 2019-08-23 DIAGNOSIS — R0789 Other chest pain: Secondary | ICD-10-CM | POA: Diagnosis not present

## 2019-08-23 DIAGNOSIS — L989 Disorder of the skin and subcutaneous tissue, unspecified: Secondary | ICD-10-CM | POA: Diagnosis not present

## 2019-08-30 DIAGNOSIS — L989 Disorder of the skin and subcutaneous tissue, unspecified: Secondary | ICD-10-CM | POA: Diagnosis not present

## 2019-08-30 DIAGNOSIS — R0789 Other chest pain: Secondary | ICD-10-CM | POA: Diagnosis not present

## 2019-09-12 DIAGNOSIS — I5021 Acute systolic (congestive) heart failure: Secondary | ICD-10-CM | POA: Diagnosis not present

## 2019-09-12 DIAGNOSIS — Z01818 Encounter for other preprocedural examination: Secondary | ICD-10-CM | POA: Diagnosis not present

## 2019-09-12 DIAGNOSIS — Z96641 Presence of right artificial hip joint: Secondary | ICD-10-CM | POA: Diagnosis not present

## 2019-09-12 DIAGNOSIS — I10 Essential (primary) hypertension: Secondary | ICD-10-CM | POA: Diagnosis not present

## 2019-09-12 DIAGNOSIS — J449 Chronic obstructive pulmonary disease, unspecified: Secondary | ICD-10-CM | POA: Diagnosis not present

## 2019-09-18 ENCOUNTER — Other Ambulatory Visit: Payer: Self-pay | Admitting: *Deleted

## 2019-09-18 MED ORDER — AMLODIPINE BESYLATE 5 MG PO TABS: 5.0000 mg | ORAL_TABLET | Freq: Every day | ORAL | 3 refills | Status: DC

## 2019-10-12 DIAGNOSIS — I5021 Acute systolic (congestive) heart failure: Secondary | ICD-10-CM | POA: Diagnosis not present

## 2019-10-12 DIAGNOSIS — I10 Essential (primary) hypertension: Secondary | ICD-10-CM | POA: Diagnosis not present

## 2019-10-12 DIAGNOSIS — J449 Chronic obstructive pulmonary disease, unspecified: Secondary | ICD-10-CM | POA: Diagnosis not present

## 2019-10-12 DIAGNOSIS — Z96641 Presence of right artificial hip joint: Secondary | ICD-10-CM | POA: Diagnosis not present

## 2019-10-31 ENCOUNTER — Telehealth: Payer: Self-pay | Admitting: Cardiology

## 2019-10-31 NOTE — Telephone Encounter (Signed)
Error

## 2019-11-05 ENCOUNTER — Ambulatory Visit (INDEPENDENT_AMBULATORY_CARE_PROVIDER_SITE_OTHER): Payer: Medicare Other | Admitting: Cardiology

## 2019-11-05 ENCOUNTER — Encounter: Payer: Self-pay | Admitting: Cardiology

## 2019-11-05 ENCOUNTER — Other Ambulatory Visit: Payer: Self-pay

## 2019-11-05 VITALS — BP 139/61 | HR 90 | Ht <= 58 in | Wt 114.8 lb

## 2019-11-05 DIAGNOSIS — R079 Chest pain, unspecified: Secondary | ICD-10-CM

## 2019-11-05 DIAGNOSIS — R002 Palpitations: Secondary | ICD-10-CM

## 2019-11-05 DIAGNOSIS — R0602 Shortness of breath: Secondary | ICD-10-CM | POA: Diagnosis not present

## 2019-11-05 DIAGNOSIS — I10 Essential (primary) hypertension: Secondary | ICD-10-CM

## 2019-11-05 DIAGNOSIS — I5022 Chronic systolic (congestive) heart failure: Secondary | ICD-10-CM | POA: Diagnosis not present

## 2019-11-05 NOTE — Patient Instructions (Signed)
Your physician recommends that you schedule a follow-up appointment in: 2 [place] has recommended you make the following change in your medication:   INCREASE LASIX 60 MG (1 AND 1/2 TABLETS) DAILY FOR 3 DAYS THEN CALL us WITH AN UPDATE ON YOUR BREATHING AND SWELLING  Thank you for choosing [facility]!!

## 2019-11-05 NOTE — Progress Notes (Signed)
Clinical Summary Ms. Bansal is a 86 y.o.female seen today for follow up of the following medical problems.    1. Chronic diastolic HF - echo [id number] LVEF 35-40%. Familyasked to deferischemic testing at that time. -f/u echo after medical therapy showed normalization of LVEF.04/2017 echo LVEF 0000000, grade I diastolic dysfunction. Mild AI. - lisionpril caused rash.   09/2018 LVEF 0000000, grade I diastolic dysfunction.  - reports some increased LE edema, SOB over the last few weeks. Compliant with lasix.   2. SOB - history of both COPD, CHF.  - recent SOB, oxygen levels are ok at home.  - has had some recent LE edema. Chronic orthopnea which is stable - no wheezing. Mild cough, can be productive with clear or foamy sputum, can be thick - no chest pressure or tightness - compliant with lasix.    2. COPD - followed by Dr Luan Pulling -   3. HTN - she is compliant with meds  4.Chest pain  - Jan 2020 nuclear stress: prior inferior/inferoseptal/septal myocardial infarction with moderate peri-infarct ischemia. - no recent chest pain.   5. Palpitations - long history. Prior EKGs have shown PACs - on beta blocker  - rare palpitations.  Past Medical History:  Diagnosis Date  . Cancer (HCC)    Skin  . CHF (congestive heart failure) ([place])   . COPD (chronic obstructive pulmonary disease) ([place])   . Coronary artery calcification seen on CT scan    Multivessel  . History of cardiomyopathy    a. EF 35-40% by echo in 10/2016 b. improved to 55-60% by echo in 04/2017  . History of shingles   . Hypertension   . Kidney stones   . Osteoarthritis   . Salivary gland stone   . Sepsis ([place])      Allergies  Allergen Reactions  . Enalapril   . Erythromycin Swelling  . Levaquin [Levofloxacin] Other (See Comments)    Tongue turns red and sore  . Penicillins Swelling    Has patient had a PCN reaction causing immediate rash, facial/tongue/throat swelling, SOB  or lightheadedness with hypotension: Yes Has patient had a PCN reaction causing severe rash involving mucus membranes or skin necrosis: No Has patient had a PCN reaction that required hospitalization No Has patient had a PCN reaction occurring within the last 10 years: No If all of the above answers are "NO", then may proceed with Cephalosporin use.   . Sulfa Antibiotics Other (See Comments)    Tongue turns red and sore     Current Outpatient Medications  Medication Sig Dispense Refill  . amLODipine (NORVASC) 5 MG tablet Take 1 tablet (5 mg total) by mouth daily. 30 tablet 3  . ANORO ELLIPTA 62.5-25 MCG/INH AEPB Inhale 1 puff into the lungs at bedtime.     Marland Kitchen aspirin EC 81 MG tablet Take 81 mg by mouth daily.    . feeding supplement, ENSURE ENLIVE, (ENSURE ENLIVE) LIQD Take 237 mLs by mouth 2 (two) times daily between meals. (Patient taking differently: Take 237 mLs by mouth daily. ) 237 mL 12  . furosemide (LASIX) 40 MG tablet Take 1 tablet (40 mg total) by mouth daily. (May take an extra 1/2 tab as needed for severe swelling.) 45 tablet 6  . ibuprofen (ADVIL) 200 MG tablet Take 200 mg by mouth every 6 (six) hours as needed.    . metoprolol succinate (TOPROL-XL) 100 MG 24 hr tablet Take 1 tablet (100 mg total) by mouth daily. 30 tablet  2  . nitroGLYCERIN (NITROSTAT) 0.4 MG SL tablet Place 1 tablet (0.4 mg total) under the tongue every 5 (five) minutes as needed for chest pain (CP or SOB). 30 tablet 1  . oxyCODONE-acetaminophen (PERCOCET/ROXICET) 5-325 MG tablet Take 0.5 tablets by mouth 4 (four) times daily as needed for moderate pain or severe pain.   0  . potassium chloride SA (K-DUR,KLOR-CON) 20 MEQ tablet Take 1 tablet (20 mEq total) by mouth daily. Takes only when Furosemide is taken 30 tablet 3  . PROAIR HFA 108 (90 Base) MCG/ACT inhaler Inhale 1 puff into the lungs every 6 (six) hours as needed for wheezing or shortness of breath.   11   No current facility-administered medications  for this visit.     Past Surgical History:  Procedure Laterality Date  . BUNIONECTOMY    . CATARACT EXTRACTION Bilateral   . CHOLECYSTECTOMY    . COLONOSCOPY  11/16/2012   Procedure: COLONOSCOPY;  Surgeon: Rogene Houston, MD;  [facility];  Service: Endoscopy;  Laterality: N/A;  100  . LITHOTRIPSY    . ORIF PERIPROSTHETIC FRACTURE Right 10/29/2014   Procedure: OPEN REDUCTION INTERNAL FIXATION (ORIF) PERIPROSTHETIC FEMUR FRACTURE/FEMUR SHAFT;  Surgeon: Mcarthur Rossetti, MD;  Location: [facility];  Service: Orthopedics;  Laterality: Right;  . TONSILLECTOMY    . TOTAL HIP ARTHROPLASTY     1989     Allergies  Allergen Reactions  . Enalapril   . Erythromycin Swelling  . Levaquin [Levofloxacin] Other (See Comments)    Tongue turns red and sore  . Penicillins Swelling    Has patient had a PCN reaction causing immediate rash, facial/tongue/throat swelling, SOB or lightheadedness with hypotension: Yes Has patient had a PCN reaction causing severe rash involving mucus membranes or skin necrosis: No Has patient had a PCN reaction that required hospitalization No Has patient had a PCN reaction occurring within the last 10 years: No If all of the above answers are "NO", then may proceed with Cephalosporin use.   . Sulfa Antibiotics Other (See Comments)    Tongue turns red and sore      Family History  Problem Relation Age of Onset  . Hypertension Sister      Social History Ms. Shorten reports that she quit smoking about 22 years ago. Her smoking use included cigarettes. She has a 17.50 pack-year smoking history. She has never used smokeless tobacco. Ms. Sherfey reports no history of alcohol use.   Review of Systems CONSTITUTIONAL: No weight loss, fever, chills, weakness or fatigue.  HEENT: Eyes: No visual loss, blurred vision, double vision or yellow sclerae.No hearing loss, sneezing, congestion, runny nose or sore throat.  SKIN: No rash or itching.    CARDIOVASCULAR: per hpi RESPIRATORY: No shortness of breath, cough or sputum.  GASTROINTESTINAL: No anorexia, nausea, vomiting or diarrhea. No abdominal pain or blood.  GENITOURINARY: No burning on urination, no polyuria NEUROLOGICAL: No headache, dizziness, syncope, paralysis, ataxia, numbness or tingling in the extremities. No change in bowel or bladder control.  MUSCULOSKELETAL: No muscle, back pain, joint pain or stiffness.  LYMPHATICS: No enlarged nodes. No history of splenectomy.  PSYCHIATRIC: No history of depression or anxiety.  ENDOCRINOLOGIC: No reports of sweating, cold or heat intolerance. No polyuria or polydipsia.  Marland Kitchen   Physical Examination Today's Vitals   11/05/19 1353  BP: 139/61  Pulse: 90  SpO2: 96%  Weight: 114 lb 12.8 oz (52.1 kg)  Height: 4\' 7"  (1.397 m)   Body mass index  is 26.68 kg/m.  Gen: resting comfortably, no acute distress HEENT: no scleral icterus, pupils equal round and reactive, no palptable cervical adenopathy,  CV: RRR, no m/r/g. No JVD Resp: mild bilateral crackles GI: abdomen is soft, non-tender, non-distended, normal bowel sounds, no hepatosplenomegaly MSK: extremities are warm, 1+ bialteral LE edema Skin: warm, no rash Neuro:  no focal deficits Psych: appropriate affect   Diagnostic Studies  Jan 2020 nuclear stress  There was no ST segment deviation noted during stress.  Findings consistent with prior inferior/inferoseptal/septal myocardial infarction with moderate peri-infarct ischemia.  This is an intermediate risk study.  The left ventricular ejection fraction is hyperdynamic (>65%).   Assessment and Plan   1.Chronic diastolic HF -some signs of fluid overload and SOB. Increase lasix from 40mg  daily to 60mg  daily x 3 days, update [place] midweek on symptoms, swelling and weights.  - will need to get labs, wait to hear her update mid week on symptoms before ordering.    2. Chest pain - recent moderate risk nuclear stress  test - managing medically at this time, if refractory may have to consider cath - no recent chest pain, continue to monitor.   3. HTN Reasonable control given advanced age, continue current meds  4. Palpitations -rare symptoms, continue beta blocker  5. COPD - continue inhalers. She is in between pcps, agreed if needed to refill her inhalers     Arnoldo Lenis, M.D.

## 2019-11-09 ENCOUNTER — Telehealth: Payer: Self-pay | Admitting: Cardiology

## 2019-11-09 DIAGNOSIS — I10 Essential (primary) hypertension: Secondary | ICD-10-CM

## 2019-11-09 NOTE — Telephone Encounter (Signed)
Patient called to report that since taking Lasix she is better. Her weight this am 112.6

## 2019-11-09 NOTE — Telephone Encounter (Signed)
Pt voiced understanding - will have labs done at [facility]

## 2019-11-09 NOTE — Telephone Encounter (Signed)
Would go back to lasix 40mg  daily, can always take 60mg  on days her breathing is getting more short or if increased leg swelling. Can we get a BMET and Mg for her   Zandra Abts MD

## 2019-11-12 DIAGNOSIS — I5021 Acute systolic (congestive) heart failure: Secondary | ICD-10-CM | POA: Diagnosis not present

## 2019-11-12 DIAGNOSIS — Z96641 Presence of right artificial hip joint: Secondary | ICD-10-CM | POA: Diagnosis not present

## 2019-11-12 DIAGNOSIS — J449 Chronic obstructive pulmonary disease, unspecified: Secondary | ICD-10-CM | POA: Diagnosis not present

## 2019-11-12 DIAGNOSIS — I10 Essential (primary) hypertension: Secondary | ICD-10-CM | POA: Diagnosis not present

## 2019-11-14 DIAGNOSIS — Z7689 Persons encountering health services in other specified circumstances: Secondary | ICD-10-CM | POA: Diagnosis not present

## 2019-11-28 ENCOUNTER — Encounter: Payer: Self-pay | Admitting: *Deleted

## 2019-12-05 ENCOUNTER — Telehealth: Payer: Self-pay | Admitting: *Deleted

## 2019-12-05 NOTE — Telephone Encounter (Signed)
Pt aware (mg is now available) routed to pcp

## 2019-12-05 NOTE — Telephone Encounter (Signed)
-----   Message from Arnoldo Lenis, MD sent at 11/26/2019 12:00 PM EST ----- Labs look good, can we see if the Mg is back  Zandra Abts MD

## 2019-12-13 DIAGNOSIS — J449 Chronic obstructive pulmonary disease, unspecified: Secondary | ICD-10-CM | POA: Diagnosis not present

## 2019-12-13 DIAGNOSIS — I5021 Acute systolic (congestive) heart failure: Secondary | ICD-10-CM | POA: Diagnosis not present

## 2019-12-13 DIAGNOSIS — Z96641 Presence of right artificial hip joint: Secondary | ICD-10-CM | POA: Diagnosis not present

## 2019-12-13 DIAGNOSIS — I10 Essential (primary) hypertension: Secondary | ICD-10-CM | POA: Diagnosis not present

## 2020-01-02 DIAGNOSIS — C44712 Basal cell carcinoma of skin of right lower limb, including hip: Secondary | ICD-10-CM | POA: Diagnosis not present

## 2020-01-02 DIAGNOSIS — B351 Tinea unguium: Secondary | ICD-10-CM | POA: Diagnosis not present

## 2020-01-02 DIAGNOSIS — B353 Tinea pedis: Secondary | ICD-10-CM | POA: Diagnosis not present

## 2020-01-08 ENCOUNTER — Encounter: Payer: Self-pay | Admitting: Cardiology

## 2020-01-08 ENCOUNTER — Other Ambulatory Visit: Payer: Self-pay

## 2020-01-08 ENCOUNTER — Ambulatory Visit (INDEPENDENT_AMBULATORY_CARE_PROVIDER_SITE_OTHER): Payer: Medicare Other | Admitting: Cardiology

## 2020-01-08 VITALS — BP 134/62 | HR 89 | Ht <= 58 in | Wt 113.8 lb

## 2020-01-08 DIAGNOSIS — R079 Chest pain, unspecified: Secondary | ICD-10-CM | POA: Diagnosis not present

## 2020-01-08 DIAGNOSIS — I5032 Chronic diastolic (congestive) heart failure: Secondary | ICD-10-CM

## 2020-01-08 MED ORDER — FUROSEMIDE 40 MG PO TABS: 60.0000 mg | ORAL_TABLET | Freq: Every day | ORAL | 1 refills | Status: DC

## 2020-01-08 MED ORDER — NITROGLYCERIN 0.4 MG SL SUBL: 0.4000 mg | SUBLINGUAL_TABLET | SUBLINGUAL | 3 refills | Status: DC | PRN

## 2020-01-08 NOTE — Patient Instructions (Signed)
Your physician wants you to follow-up in: [place] will receive a reminder letter in the mail two months in advance. If you don't receive a letter, please call our office to schedule the follow-up appointment.  Your physician has recommended you make the following change in your medication:   INCREASE LASIX 60 MG (1 AND 1/2 TABLETS) DAILY   Thank you for choosing [facility]!!

## 2020-01-08 NOTE — Progress Notes (Signed)
Clinical Summary Hannah Mckee is a 86 y.o.female  seen today for follow up of the following medical problems.    1. Chronic diastolic HF - echo [id number] LVEF 35-40%. Familyasked to deferischemic testing at that time. -f/u echo after medical therapy showed normalization of LVEF.04/2017 echo LVEF 0000000, grade I diastolic dysfunction. Mild AI. - lisionpril caused rash.   09/2018 LVEF 0000000, grade I diastolic dysfunction. - home weights 112 lbs. LE edema is up and down.    2. SOB - history of both COPD, CHF.  - recent SOB, oxygen levels are ok at home.  - has had some recent LE edema. Chronic orthopnea which is stable - no wheezing. Mild cough, can be productive with clear or foamy sputum, can be thick - no chest pressure or tightness - compliant with lasix.    - recent symptoms improved with incraesed lasix to 60mg  for a few days, now back to 40mg     3. COPD - followed by Dr Luan Pulling - some SOB at times.   4. HTN - she is compliant with meds  5.Chest pain  - Jan 2020 nuclear stress:prior inferior/inferoseptal/septal myocardial infarction with moderate peri-infarct ischemia. - denies any chest pain  6. Palpitations - long history. Prior EKGs have shown PACs - on beta blocker  - rare palpitations.    Does not want covid vaccine.   Past Medical History:  Diagnosis Date  . Cancer (HCC)    Skin  . CHF (congestive heart failure) ([place])   . COPD (chronic obstructive pulmonary disease) ([place])   . Coronary artery calcification seen on CT scan    Multivessel  . History of cardiomyopathy    a. EF 35-40% by echo in 10/2016 b. improved to 55-60% by echo in 04/2017  . History of shingles   . Hypertension   . Kidney stones   . Osteoarthritis   . Salivary gland stone   . Sepsis ([place])      Allergies  Allergen Reactions  . Enalapril   . Erythromycin Swelling  . Levaquin [Levofloxacin] Other (See Comments)    Tongue turns red and sore    . Penicillins Swelling    Has patient had a PCN reaction causing immediate rash, facial/tongue/throat swelling, SOB or lightheadedness with hypotension: Yes Has patient had a PCN reaction causing severe rash involving mucus membranes or skin necrosis: No Has patient had a PCN reaction that required hospitalization No Has patient had a PCN reaction occurring within the last 10 years: No If all of the above answers are "NO", then may proceed with Cephalosporin use.   . Sulfa Antibiotics Other (See Comments)    Tongue turns red and sore     Current Outpatient Medications  Medication Sig Dispense Refill  . amLODipine (NORVASC) 5 MG tablet Take 1 tablet (5 mg total) by mouth daily. 30 tablet 3  . ANORO ELLIPTA 62.5-25 MCG/INH AEPB Inhale 1 puff into the lungs at bedtime.     Marland Kitchen aspirin EC 81 MG tablet Take 81 mg by mouth daily.    . feeding supplement, ENSURE ENLIVE, (ENSURE ENLIVE) LIQD Take 237 mLs by mouth 2 (two) times daily between meals. (Patient taking differently: Take 237 mLs by mouth daily. ) 237 mL 12  . furosemide (LASIX) 40 MG tablet Take 1 tablet (40 mg total) by mouth daily. (May take an extra 1/2 tab as needed for severe swelling.) 45 tablet 6  . metoprolol succinate (TOPROL-XL) 100 MG 24 hr tablet Take 1  tablet (100 mg total) by mouth daily. 30 tablet 2  . nitroGLYCERIN (NITROSTAT) 0.4 MG SL tablet Place 1 tablet (0.4 mg total) under the tongue every 5 (five) minutes as needed for chest pain (CP or SOB). 30 tablet 1  . oxyCODONE-acetaminophen (PERCOCET/ROXICET) 5-325 MG tablet Take 0.5 tablets by mouth 4 (four) times daily as needed for moderate pain or severe pain.   0  . potassium chloride SA (K-DUR,KLOR-CON) 20 MEQ tablet Take 1 tablet (20 mEq total) by mouth daily. Takes only when Furosemide is taken 30 tablet 3  . PROAIR HFA 108 (90 Base) MCG/ACT inhaler Inhale 1 puff into the lungs every 6 (six) hours as needed for wheezing or shortness of breath.   11   No current  facility-administered medications for this visit.     Past Surgical History:  Procedure Laterality Date  . BUNIONECTOMY    . CATARACT EXTRACTION Bilateral   . CHOLECYSTECTOMY    . COLONOSCOPY  11/16/2012   Procedure: COLONOSCOPY;  Surgeon: Rogene Houston, MD;  [facility];  Service: Endoscopy;  Laterality: N/A;  100  . LITHOTRIPSY    . ORIF PERIPROSTHETIC FRACTURE Right 10/29/2014   Procedure: OPEN REDUCTION INTERNAL FIXATION (ORIF) PERIPROSTHETIC FEMUR FRACTURE/FEMUR SHAFT;  Surgeon: Mcarthur Rossetti, MD;  Location: [facility];  Service: Orthopedics;  Laterality: Right;  . TONSILLECTOMY    . TOTAL HIP ARTHROPLASTY     1989     Allergies  Allergen Reactions  . Enalapril   . Erythromycin Swelling  . Levaquin [Levofloxacin] Other (See Comments)    Tongue turns red and sore  . Penicillins Swelling    Has patient had a PCN reaction causing immediate rash, facial/tongue/throat swelling, SOB or lightheadedness with hypotension: Yes Has patient had a PCN reaction causing severe rash involving mucus membranes or skin necrosis: No Has patient had a PCN reaction that required hospitalization No Has patient had a PCN reaction occurring within the last 10 years: No If all of the above answers are "NO", then may proceed with Cephalosporin use.   . Sulfa Antibiotics Other (See Comments)    Tongue turns red and sore      Family History  Problem Relation Age of Onset  . Hypertension Sister      Social History Ms. Penaflor reports that she quit smoking about 22 years ago. Her smoking use included cigarettes. She has a 17.50 pack-year smoking history. She has never used smokeless tobacco. Ms. Dorschner reports no history of alcohol use.   Review of Systems CONSTITUTIONAL: No weight loss, fever, chills, weakness or fatigue.  HEENT: Eyes: No visual loss, blurred vision, double vision or yellow sclerae.No hearing loss, sneezing, congestion, runny nose or sore throat.  SKIN:  No rash or itching.  CARDIOVASCULAR: per hpi RESPIRATORY: per hpi GASTROINTESTINAL: No anorexia, nausea, vomiting or diarrhea. No abdominal pain or blood.  GENITOURINARY: No burning on urination, no polyuria NEUROLOGICAL: No headache, dizziness, syncope, paralysis, ataxia, numbness or tingling in the extremities. No change in bowel or bladder control.  MUSCULOSKELETAL: No muscle, back pain, joint pain or stiffness.  LYMPHATICS: No enlarged nodes. No history of splenectomy.  PSYCHIATRIC: No history of depression or anxiety.  ENDOCRINOLOGIC: No reports of sweating, cold or heat intolerance. No polyuria or polydipsia.  Marland Kitchen   Physical Examination Today's Vitals   01/08/20 1440  BP: 134/62  Pulse: 89  SpO2: 98%  Weight: 113 lb 12.8 oz (51.6 kg)  Height: 4\' 6"  (1.372 m)   Body  mass index is 27.44 kg/m.  Gen: resting comfortably, no acute distress HEENT: no scleral icterus, pupils equal round and reactive, no palptable cervical adenopathy,  CV: RRR,, no m/r/g, no jvd Resp: Clear to auscultation bilaterally GI: abdomen is soft, non-tender, non-distended, normal bowel sounds, no hepatosplenomegaly MSK: extremities are warm, no edema.  Skin: warm, no rash Neuro:  no focal deficits Psych: appropriate affect   Diagnostic Studies  Jan 2020 nuclear stress  There was no ST segment deviation noted during stress.  Findings consistent with prior inferior/inferoseptal/septal myocardial infarction with moderate peri-infarct ischemia.  This is an intermediate risk study.  The left ventricular ejection fraction is hyperdynamic (>65%   Assessment and Plan   1.Chronic diastolic HF -appears euvolemic today. We will change he RX to 60mg  daily, however she will take between 40 to 60mg  depending on swelling and symptoms.    2. Chest pain - recent moderate risk nuclear stress test - managing medically at this time, if refractory may have to consider cath - no chest pains, continue to  monitor.   3. HTN - good control, continue current meds      Arnoldo Lenis, M.D.

## 2020-01-10 DIAGNOSIS — I5021 Acute systolic (congestive) heart failure: Secondary | ICD-10-CM | POA: Diagnosis not present

## 2020-01-10 DIAGNOSIS — I10 Essential (primary) hypertension: Secondary | ICD-10-CM | POA: Diagnosis not present

## 2020-01-10 DIAGNOSIS — J449 Chronic obstructive pulmonary disease, unspecified: Secondary | ICD-10-CM | POA: Diagnosis not present

## 2020-01-10 DIAGNOSIS — Z96641 Presence of right artificial hip joint: Secondary | ICD-10-CM | POA: Diagnosis not present

## 2020-01-11 ENCOUNTER — Other Ambulatory Visit: Payer: Self-pay | Admitting: Cardiology

## 2020-01-14 DIAGNOSIS — H903 Sensorineural hearing loss, bilateral: Secondary | ICD-10-CM | POA: Diagnosis not present

## 2020-01-14 DIAGNOSIS — K1121 Acute sialoadenitis: Secondary | ICD-10-CM | POA: Diagnosis not present

## 2020-01-25 ENCOUNTER — Other Ambulatory Visit: Payer: Self-pay | Admitting: *Deleted

## 2020-01-25 MED ORDER — POTASSIUM CHLORIDE CRYS ER 20 MEQ PO TBCR: 20.0000 meq | EXTENDED_RELEASE_TABLET | Freq: Every day | ORAL | 1 refills | Status: DC

## 2020-02-10 DIAGNOSIS — I10 Essential (primary) hypertension: Secondary | ICD-10-CM | POA: Diagnosis not present

## 2020-02-10 DIAGNOSIS — I5021 Acute systolic (congestive) heart failure: Secondary | ICD-10-CM | POA: Diagnosis not present

## 2020-02-10 DIAGNOSIS — Z96641 Presence of right artificial hip joint: Secondary | ICD-10-CM | POA: Diagnosis not present

## 2020-02-10 DIAGNOSIS — J449 Chronic obstructive pulmonary disease, unspecified: Secondary | ICD-10-CM | POA: Diagnosis not present

## 2020-02-22 DIAGNOSIS — K1121 Acute sialoadenitis: Secondary | ICD-10-CM | POA: Diagnosis not present

## 2020-02-22 DIAGNOSIS — H903 Sensorineural hearing loss, bilateral: Secondary | ICD-10-CM | POA: Diagnosis not present

## 2020-02-25 ENCOUNTER — Other Ambulatory Visit: Payer: Self-pay | Admitting: Otolaryngology

## 2020-02-25 ENCOUNTER — Other Ambulatory Visit (HOSPITAL_COMMUNITY): Payer: Self-pay | Admitting: Otolaryngology

## 2020-02-25 DIAGNOSIS — K116 Mucocele of salivary gland: Secondary | ICD-10-CM

## 2020-02-27 ENCOUNTER — Other Ambulatory Visit: Payer: Self-pay

## 2020-02-27 ENCOUNTER — Ambulatory Visit (HOSPITAL_COMMUNITY)
Admission: RE | Admit: 2020-02-27 | Discharge: 2020-02-27 | Disposition: A | Discharge status: Home / Self Care | Payer: Medicare Other | Source: Ambulatory Visit | Attending: Otolaryngology | Admitting: Otolaryngology

## 2020-02-27 DIAGNOSIS — K116 Mucocele of salivary gland: Secondary | ICD-10-CM

## 2020-02-27 LAB — POCT I-STAT CREATININE: Creatinine, Ser: 1.1 mg/dL — ABNORMAL HIGH (ref 0.44–1.00)

## 2020-02-27 MED ORDER — IOHEXOL 300 MG/ML  SOLN
75.0000 mL | Freq: Once | INTRAMUSCULAR | Status: AC | PRN
  Administered 2020-02-27: 11:00:00 60 mL via INTRAVENOUS

## 2020-02-28 ENCOUNTER — Telehealth: Payer: Self-pay

## 2020-02-28 NOTE — Telephone Encounter (Signed)
Per Pt's daughter, oxygen levels drops at times. No answer at PMD([place]'s office.  Please call daughter-Gail [phone]  Thanks renee

## 2020-03-11 DIAGNOSIS — Z96641 Presence of right artificial hip joint: Secondary | ICD-10-CM | POA: Diagnosis not present

## 2020-03-11 DIAGNOSIS — I10 Essential (primary) hypertension: Secondary | ICD-10-CM | POA: Diagnosis not present

## 2020-03-11 DIAGNOSIS — J449 Chronic obstructive pulmonary disease, unspecified: Secondary | ICD-10-CM | POA: Diagnosis not present

## 2020-03-11 DIAGNOSIS — I5021 Acute systolic (congestive) heart failure: Secondary | ICD-10-CM | POA: Diagnosis not present

## 2020-03-13 ENCOUNTER — Ambulatory Visit: Payer: Medicare Other | Admitting: Family Medicine

## 2020-03-14 ENCOUNTER — Other Ambulatory Visit: Payer: Self-pay | Admitting: Otolaryngology

## 2020-03-14 DIAGNOSIS — H903 Sensorineural hearing loss, bilateral: Secondary | ICD-10-CM | POA: Diagnosis not present

## 2020-03-14 DIAGNOSIS — D3709 Neoplasm of uncertain behavior of other specified sites of the oral cavity: Secondary | ICD-10-CM | POA: Diagnosis not present

## 2020-03-17 ENCOUNTER — Other Ambulatory Visit: Payer: Self-pay

## 2020-03-17 ENCOUNTER — Encounter (HOSPITAL_BASED_OUTPATIENT_CLINIC_OR_DEPARTMENT_OTHER): Payer: Self-pay | Admitting: Otolaryngology

## 2020-03-17 NOTE — Progress Notes (Signed)
I spoke with Dr. Deeann Saint office and have requested cards clearance note in Epic from Dr. Nelly Laurence office.

## 2020-03-17 NOTE — Progress Notes (Signed)
After completing the pre op phone call with pt's daughter Baker Janus her POA, I reviewed the chart with  Dr.Hodeierne as well. Pt is on home 02 1.5-2L/min [place] at night and during the day if needed. Pt cannot lay flat due to severe Kyphosis per pts daughter.He feels that the best and safest option for this pt is to have the procedure done in the main OR. Heather at [place] office is aware.

## 2020-03-18 ENCOUNTER — Other Ambulatory Visit: Payer: Self-pay | Admitting: Otolaryngology

## 2020-03-18 ENCOUNTER — Other Ambulatory Visit (HOSPITAL_COMMUNITY)
Admission: RE | Admit: 2020-03-18 | Discharge: 2020-03-18 | Disposition: A | Discharge status: Home / Self Care | Payer: Medicare Other | Source: Ambulatory Visit | Attending: Otolaryngology | Admitting: Otolaryngology

## 2020-03-18 ENCOUNTER — Telehealth: Payer: Self-pay | Admitting: Cardiology

## 2020-03-18 DIAGNOSIS — Z20822 Contact with and (suspected) exposure to covid-19: Secondary | ICD-10-CM | POA: Diagnosis not present

## 2020-03-18 DIAGNOSIS — Z01812 Encounter for preprocedural laboratory examination: Secondary | ICD-10-CM | POA: Diagnosis not present

## 2020-03-18 LAB — SARS CORONAVIRUS 2 (TAT 6-24 HRS): SARS Coronavirus 2: NEGATIVE

## 2020-03-18 NOTE — Telephone Encounter (Signed)
   [facility] Pre-operative Risk Assessment    HEARTCARE STAFF: - Please ensure there is not already an duplicate clearance open for this procedure - Under Visit Info/Reason for Call, type in Other and utilize the format Clearance MM/DD/YY or Clearance TBD  Request for surgical clearance:  1. What type of surgery is being performed? Excision of oral mass-  2. When is this surgery scheduled?  Mar 21, 2020  3. What type of clearance is required (medical clearance vs. Pharmacy clearance to hold med vs. Both)? Both   4. Are there any medications that need to be held prior to surgery and how long? Not stated but asking if there is medication that needs to be held to notify how long to hold prior to surgery  5. Practice name and name of physician performing surgery? [facility]   Dr Benjamine Mola  6. What is the office phone number? [phone]   7.   What is the office fax number? [phone]  8.   Anesthesia type (None, local, MAC, general) ? MAC   [name] 03/18/2020, 11:24 AM  _________________________________________________________________   (provider comments below)

## 2020-03-19 ENCOUNTER — Ambulatory Visit (HOSPITAL_COMMUNITY)
Admission: RE | Admit: 2020-03-19 | Discharge: 2020-03-19 | Disposition: A | Discharge status: Home / Self Care | Payer: Medicare Other | Source: Ambulatory Visit | Attending: Adult Health Nurse Practitioner | Admitting: Adult Health Nurse Practitioner

## 2020-03-19 ENCOUNTER — Other Ambulatory Visit: Payer: Self-pay

## 2020-03-19 ENCOUNTER — Other Ambulatory Visit (HOSPITAL_COMMUNITY): Payer: Self-pay | Admitting: Adult Health Nurse Practitioner

## 2020-03-19 DIAGNOSIS — Z0189 Encounter for other specified special examinations: Secondary | ICD-10-CM | POA: Diagnosis not present

## 2020-03-19 DIAGNOSIS — M545 Low back pain, unspecified: Secondary | ICD-10-CM

## 2020-03-19 DIAGNOSIS — I5032 Chronic diastolic (congestive) heart failure: Secondary | ICD-10-CM | POA: Diagnosis not present

## 2020-03-19 DIAGNOSIS — J440 Chronic obstructive pulmonary disease with acute lower respiratory infection: Secondary | ICD-10-CM | POA: Diagnosis not present

## 2020-03-19 DIAGNOSIS — W19XXXA Unspecified fall, initial encounter: Secondary | ICD-10-CM

## 2020-03-19 DIAGNOSIS — I10 Essential (primary) hypertension: Secondary | ICD-10-CM | POA: Diagnosis not present

## 2020-03-19 DIAGNOSIS — M47816 Spondylosis without myelopathy or radiculopathy, lumbar region: Secondary | ICD-10-CM | POA: Insufficient documentation

## 2020-03-19 DIAGNOSIS — J449 Chronic obstructive pulmonary disease, unspecified: Secondary | ICD-10-CM | POA: Diagnosis not present

## 2020-03-19 DIAGNOSIS — I2510 Atherosclerotic heart disease of native coronary artery without angina pectoris: Secondary | ICD-10-CM | POA: Diagnosis not present

## 2020-03-19 DIAGNOSIS — S299XXA Unspecified injury of thorax, initial encounter: Secondary | ICD-10-CM | POA: Diagnosis not present

## 2020-03-20 ENCOUNTER — Other Ambulatory Visit: Payer: Self-pay

## 2020-03-20 ENCOUNTER — Encounter (HOSPITAL_COMMUNITY): Payer: Self-pay | Admitting: Otolaryngology

## 2020-03-20 NOTE — Telephone Encounter (Signed)
Ok to proceed with surgery from cardiac standpoint, no cardiac meds need to be held   Carlyle Dolly MD

## 2020-03-20 NOTE — Anesthesia Preprocedure Evaluation (Addendum)
Anesthesia Evaluation  Patient identified by MRN, date of birth, ID band Patient awake    Reviewed: Allergy & Precautions, NPO status , Patient's Chart, lab work & pertinent test results  Airway Mallampati: II  TM Distance: >3 FB     Dental  (+) Dental Advisory Given   Pulmonary COPD, former smoker,    breath sounds clear to auscultation       Cardiovascular hypertension, Pt. on medications + CAD and +CHF   Rhythm:Regular Rate:Normal     Neuro/Psych negative neurological ROS     GI/Hepatic negative GI ROS, Neg liver ROS,   Endo/Other  negative endocrine ROS  Renal/GU negative Renal ROS     Musculoskeletal  (+) Arthritis ,   Abdominal   Peds  Hematology negative hematology ROS (+)   Anesthesia Other Findings   Reproductive/Obstetrics                            Anesthesia Physical Anesthesia Plan  ASA: III  Anesthesia Plan: General   Post-op Pain Management:    Induction: Intravenous  PONV Risk Score and Plan: Ondansetron, Dexamethasone, Treatment may vary due to age or medical condition and TIVA  Airway Management Planned: Oral ETT  Additional Equipment: None  Intra-op Plan:   Post-operative Plan: Extubation in OR  Informed Consent: I have reviewed the patients History and Physical, chart, labs and discussed the procedure including the risks, benefits and alternatives for the proposed anesthesia with the patient or authorized representative who has indicated his/her understanding and acceptance.     Dental advisory given  Plan Discussed with: CRNA  Anesthesia Plan Comments: ( )      Anesthesia Quick Evaluation

## 2020-03-20 NOTE — Progress Notes (Signed)
Anesthesia Chart Review: Hannah Mckee   Case: [id number] Date/Time: 03/21/20 0915   Procedure: EXCISION ORAL MASS (N/A )   Anesthesia type: Monitor Anesthesia Care   Pre-op diagnosis: ORAL MASS   Location: [facility] OR ROOM 09 / [facility]   Surgeons: Leta Baptist, MD      DISCUSSION: Patient is an 86 year old female scheduled for the above procedure. She was moved from [facility] due to home oxygen and inability to lie flat due to severe kyphosis.  History includes former smoker (quit 01/30/97), HTN, COPD (nocturnal O2, 2L and PRN day), CAD (diffuse coronary calcifications on 05/23/17 CTA), cardiomyopathy (LVEF 35-40% 10/2016, declined ischemic testing, but EF normalized after medical therapy 04/2017; 11/2018 stress test showed prior inferior/inferoseptal/septal MI with moderate peri-infarct ischemic, medical therapy unless persistent symptoms), chronic diastolic CHF, exertional dyspnea, skin cancer, kyphosis. Remote history of prolonged emergence.   Per cardiologist Dr. Harl Bowie on 03/20/20: "Ok to proceed with surgery from cardiac standpoint, no cardiac meds need to be held".  No chest pain and euvolemic on exam at 01/08/2020 visit.  Presurgical COVID-19 test negative on 03/18/2020.  Last aspirin 03/18/2020. She had labs on 03/19/20 through Dr. Nevada Crane (discussed below).  Anesthesia team to evaluate on the day of surgery.   VS: On 03/19/20: BP 148/82, HR 85, O2 sat 95%.    PROVIDERSWende Neighbors, MD is PCP (recently established 03/19/20 as new patient and will reportedly take over COPD management).  Sinda Du, MD was pulmonologist (was also seeing for primary care), but he recently retired.    LABS: Patient had a CBC with differential, comprehensive metabolic panel, lipid panel, iron and TIBC, vitamin D on 03/19/2020 per Dr. Nevada Crane. Results included: WBC 10.0, hemoglobin 11.9, hematocrit 35.3, platelet count 339, glucose 92, BUN 12 creatinine 0.71, sodium 141, potassium 4.0, total bilirubin 0.4, alkaline phosphatase 292,  AST 16, ALT 6.   IMAGES: CT soft tissue neck 02/27/20: IMPRESSION: - Enhancing soft tissue abutting the lower left alveolar ridge and communicating with the sublingual space is concerning for neoplasm. Further evaluation with tissue sampling is recommended. - 5 mm left vallecula nodule is new. Direct visualization is recommended. - Prominent bilateral level 1B and left level 2A nodes are nonspecific. - Right posterior fossa calcified meningioma is unchanged.    EKG: 11/05/19: Sinus  Tachycardia at 101 bpm - frequent multiform ectopic ventricular beats  # VECs = 2, # types 2 Low voltage in limb leads.   -Nonspecific ST depression  -Nondiagnostic.  ABNORMAL    CV: Nuclear stress test 11/22/18:  There was no ST segment deviation noted during stress.  Findings consistent with prior inferior/inferoseptal/septal myocardial infarction with moderate peri-infarct ischemia.  This is an intermediate risk study.  The left ventricular ejection fraction is hyperdynamic (>65%). - Reviewed by Dr. Harl Bowie on 11/24/18, "Stress test shows a moderate blockaged on the bottom of her heart. Overall this is something we can continue to try to treat with medications, however if symptoms continue or progress we may have to discuss a possible cath again if she is open to it."   Echo 09/07/18: Study Conclusions  - Left ventricle: The cavity size was normal. Wall thickness was  increased in a pattern of mild LVH. Systolic function was normal.  The estimated ejection fraction was in the range of 55% to 60%.  Wall motion was normal; there were no regional wall motion  abnormalities. Doppler parameters are consistent with abnormal  left ventricular relaxation (grade 1 diastolic dysfunction).  - Aortic  valve: Moderately calcified annulus. Trileaflet; mildly  calcified leaflets. There was mild regurgitation.  - Mitral valve: Moderately calcified annulus. There was mild  regurgitation.  - Left  atrium: The atrium was moderately dilated.  - Right atrium: The atrium was at the upper limits of normal in  size. Central venous pressure (est): 3 mm Hg.  - Atrial septum: No defect or patent foramen ovale was identified.  - Tricuspid valve: There was mild regurgitation.  - Pulmonary arteries: PA peak pressure: 37 mm Hg (S).  - Pericardium, extracardiac: A prominent pericardial fat pad was  present. A possible, small pericardial effusion was identified  basal anterior to the heart.  (Comparison: 10/06/16: LVEF 35-40%, diffuse hypokinesis, severe basal inferior hypokinesis, grade 2 DD in setting of PNA/sepsis/acuate respiratory failure; 04/07/17: LVEF 55-60%)   Past Medical History:  Diagnosis Date  . Cancer (HCC)    Skin, basal  . CHF (congestive heart failure) ([place])   . Complication of anesthesia 1950's   'Years ago difficult awaking up"  . COPD (chronic obstructive pulmonary disease) ([place])   . Coronary artery calcification seen on CT scan    Multivessel  . Dyspnea    short of breath walking in home, breathing normal after sitting down  . History of blood transfusion   . History of cardiomyopathy    a. EF 35-40% by echo in 10/2016 b. improved to 55-60% by echo in 04/2017  . History of kidney stones   . History of shingles   . Hypertension   . Kyphosis    marked severity  . Osteoarthritis   . Pneumonia 10/04/2016  . Salivary gland stone   . Sepsis [facility])     Past Surgical History:  Procedure Laterality Date  . BLADDER SURGERY    . BUNIONECTOMY    . CATARACT EXTRACTION Bilateral   . CHOLECYSTECTOMY    . COLONOSCOPY  11/16/2012   Procedure: COLONOSCOPY;  Surgeon: Rogene Houston, MD;  Location: [facility];  Service: Endoscopy;  Laterality: N/A;  100  . LITHOTRIPSY    . ORIF PERIPROSTHETIC FRACTURE Right 10/29/2014   Procedure: OPEN REDUCTION INTERNAL FIXATION (ORIF) PERIPROSTHETIC FEMUR FRACTURE/FEMUR SHAFT;  Surgeon: Mcarthur Rossetti, MD;  Location: [place];   Service: Orthopedics;  Laterality: Right;  . TONSILLECTOMY    . TOTAL HIP ARTHROPLASTY     1989    MEDICATIONS: No current facility-administered medications for this encounter.   Marland Kitchen acetaminophen (TYLENOL) 500 MG tablet  . amLODipine (NORVASC) 5 MG tablet  . ANORO ELLIPTA 62.5-25 MCG/INH AEPB  . aspirin EC 81 MG tablet  . ciclopirox (LOPROX) 0.77 % cream  . feeding supplement, ENSURE ENLIVE, (ENSURE ENLIVE) LIQD  . furosemide (LASIX) 40 MG tablet  . ipratropium-albuterol (DUONEB) 0.5-2.5 (3) MG/3ML SOLN  . Iron-Vitamins (S.S.S. TONIC) LIQD  . metoprolol succinate (TOPROL-XL) 100 MG 24 hr tablet  . nitroGLYCERIN (NITROSTAT) 0.4 MG SL tablet  . oxyCODONE-acetaminophen (PERCOCET/ROXICET) 5-325 MG tablet  . potassium chloride SA (KLOR-CON) 20 MEQ tablet  . PROAIR HFA 108 (90 Base) MCG/ACT inhaler    Myra Gianotti, PA-C Surgical Short Stay/Anesthesiology [facility] Phone [phone] [facility] Phone [phone] 03/20/2020 3:38 PM

## 2020-03-20 NOTE — Progress Notes (Addendum)
I called Hannah Mckee's home and cell number and did not get an answer.  I called her daughter Hannah Mckee number and patient was with her.  We have permission to speak to patient's daughter,  Collection of  history was done with Hannah Mckee close enough to answer questions with her daughter. Hannah Mckee denies chest pain. Patient tested negative for Covid_5/18/21_ and has been in quarantine since that time.  Hannah Mckee as COPD, she was seeing a pulomoglist, but he retired (Dr Standley Dakins).. Patient saw Dr. Casimer Leek 03/19/2020 as a new patient, Dr. Nevada Crane told Hannah Mckee that he could manage her COPD.  Hannah Mckee gets short of breath walking in the home, when she sits down, she is no longer short of breath.  Patient wears 2 liters  Oxygen at bedtime. Hannah Mckee has not had to use emergency nebulizer or inhaler for weeks, "she used it after she was given the second vaccine,"  Hannah Mckee reported. I have requested office notes and labs from Dr. Juel Burrow office.  I'm calling Dr. Vita Barley to ask about instructions regarding ASA and to see if they received cardiac clearance from Dr. Harl Bowie.  Hannah Mckee reported that patient fell 3 weeks ago and has been living with her.  Patient was not seen with the fall; Dr.Hall sent patient for spine xray's at [facility].  There was no acute injury.  I spoke with Nira Conn in Dr. Cathleen Fears office, she reports That Dr. Benjamine Mola did have patient stop ASA, Nira Conn does not have patient's chart in front of her, so she could not tell me when patient was suppose to stop ASA. I called Hannah Mckee's number, she said that Hannah Mckee stopped ASA on 03/18/20 Nira Conn has not received clearance from Dr Harl Bowie, she said she will call him.  Hannah Mckee has severe Kyposis, Hannah Mckee gave instructions on how to support patient with blankets and rolled towel; I left a note for Short Stay Nurse.  I am asking anesthesiology PA- C to review.

## 2020-03-21 ENCOUNTER — Ambulatory Visit (HOSPITAL_COMMUNITY)
Admission: RE | Admit: 2020-03-21 | Discharge: 2020-03-21 | Disposition: A | Discharge status: Home / Self Care | Payer: Medicare Other | Attending: Otolaryngology | Admitting: Otolaryngology

## 2020-03-21 ENCOUNTER — Other Ambulatory Visit: Payer: Self-pay

## 2020-03-21 ENCOUNTER — Encounter (HOSPITAL_COMMUNITY): Payer: Self-pay | Admitting: Otolaryngology

## 2020-03-21 ENCOUNTER — Ambulatory Visit (HOSPITAL_COMMUNITY): Payer: Medicare Other | Admitting: Vascular Surgery

## 2020-03-21 ENCOUNTER — Encounter (HOSPITAL_COMMUNITY)
Admission: RE | Disposition: A | Discharge status: Home / Self Care | Payer: Self-pay | Source: Home / Self Care | Attending: Otolaryngology

## 2020-03-21 DIAGNOSIS — D3709 Neoplasm of uncertain behavior of other specified sites of the oral cavity: Secondary | ICD-10-CM | POA: Diagnosis not present

## 2020-03-21 DIAGNOSIS — I110 Hypertensive heart disease with heart failure: Secondary | ICD-10-CM | POA: Diagnosis not present

## 2020-03-21 DIAGNOSIS — I509 Heart failure, unspecified: Secondary | ICD-10-CM | POA: Diagnosis not present

## 2020-03-21 DIAGNOSIS — Z87891 Personal history of nicotine dependence: Secondary | ICD-10-CM | POA: Diagnosis not present

## 2020-03-21 DIAGNOSIS — K149 Disease of tongue, unspecified: Secondary | ICD-10-CM | POA: Diagnosis not present

## 2020-03-21 DIAGNOSIS — J449 Chronic obstructive pulmonary disease, unspecified: Secondary | ICD-10-CM | POA: Diagnosis not present

## 2020-03-21 DIAGNOSIS — E876 Hypokalemia: Secondary | ICD-10-CM | POA: Diagnosis not present

## 2020-03-21 DIAGNOSIS — K118 Other diseases of salivary glands: Secondary | ICD-10-CM | POA: Diagnosis not present

## 2020-03-21 DIAGNOSIS — J441 Chronic obstructive pulmonary disease with (acute) exacerbation: Secondary | ICD-10-CM | POA: Diagnosis not present

## 2020-03-21 DIAGNOSIS — A419 Sepsis, unspecified organism: Secondary | ICD-10-CM | POA: Diagnosis not present

## 2020-03-21 HISTORY — PX: MASS EXCISION: SHX2000

## 2020-03-21 HISTORY — DX: Personal history of other medical treatment: Z92.89

## 2020-03-21 HISTORY — DX: Dyspnea, unspecified: R06.00

## 2020-03-21 HISTORY — DX: Personal history of urinary calculi: Z87.442

## 2020-03-21 HISTORY — DX: Other complications of anesthesia, initial encounter: T88.59XA

## 2020-03-21 HISTORY — DX: Unspecified kyphosis, site unspecified: M40.209

## 2020-03-21 SURGERY — EXCISION MASS
Anesthesia: General | Site: Mouth

## 2020-03-21 MED ORDER — DEXAMETHASONE SODIUM PHOSPHATE 10 MG/ML IJ SOLN
INTRAMUSCULAR | Status: DC | PRN
  Administered 2020-03-21: 4 mg via INTRAVENOUS

## 2020-03-21 MED ORDER — LACTATED RINGERS IV SOLN: INTRAVENOUS | Status: DC

## 2020-03-21 MED ORDER — CLINDAMYCIN HCL 300 MG PO CAPS
300.0000 mg | ORAL_CAPSULE | Freq: Three times a day (TID) | ORAL | 0 refills | Status: AC
Start: 2020-03-21 — End: 2020-03-24

## 2020-03-21 MED ORDER — PROPOFOL 10 MG/ML IV BOLUS
INTRAVENOUS | Status: AC
  Filled 2020-03-21: qty 20

## 2020-03-21 MED ORDER — SUCCINYLCHOLINE CHLORIDE 200 MG/10ML IV SOSY
PREFILLED_SYRINGE | INTRAVENOUS | Status: DC | PRN
  Administered 2020-03-21: 60 mg via INTRAVENOUS

## 2020-03-21 MED ORDER — ROCURONIUM BROMIDE 10 MG/ML (PF) SYRINGE
PREFILLED_SYRINGE | INTRAVENOUS | Status: AC
  Filled 2020-03-21: qty 10

## 2020-03-21 MED ORDER — ORAL CARE MOUTH RINSE: 15.0000 mL | Freq: Once | OROMUCOSAL | Status: AC

## 2020-03-21 MED ORDER — PROPOFOL 10 MG/ML IV BOLUS
INTRAVENOUS | Status: DC | PRN
  Administered 2020-03-21: 70 mg via INTRAVENOUS

## 2020-03-21 MED ORDER — 0.9 % SODIUM CHLORIDE (POUR BTL) OPTIME
TOPICAL | Status: DC | PRN
  Administered 2020-03-21: 1000 mL

## 2020-03-21 MED ORDER — CHLORHEXIDINE GLUCONATE 0.12 % MT SOLN
15.0000 mL | Freq: Once | OROMUCOSAL | Status: AC
  Administered 2020-03-21: 15 mL via OROMUCOSAL
  Filled 2020-03-21: qty 15

## 2020-03-21 MED ORDER — ONDANSETRON HCL 4 MG/2ML IJ SOLN
INTRAMUSCULAR | Status: AC
  Filled 2020-03-21: qty 2

## 2020-03-21 MED ORDER — LIDOCAINE-EPINEPHRINE 1 %-1:100000 IJ SOLN
INTRAMUSCULAR | Status: DC | PRN
  Administered 2020-03-21: 2 mL

## 2020-03-21 MED ORDER — PROPOFOL 500 MG/50ML IV EMUL
INTRAVENOUS | Status: DC | PRN
  Administered 2020-03-21: 100 ug/kg/min via INTRAVENOUS

## 2020-03-21 MED ORDER — ONDANSETRON HCL 4 MG/2ML IJ SOLN
INTRAMUSCULAR | Status: DC | PRN
  Administered 2020-03-21: 4 mg via INTRAVENOUS

## 2020-03-21 MED ORDER — PHENYLEPHRINE 40 MCG/ML (10ML) SYRINGE FOR IV PUSH (FOR BLOOD PRESSURE SUPPORT)
PREFILLED_SYRINGE | INTRAVENOUS | Status: DC | PRN
  Administered 2020-03-21 (×2): 120 ug via INTRAVENOUS

## 2020-03-21 MED ORDER — PHENYLEPHRINE HCL-NACL 10-0.9 MG/250ML-% IV SOLN
INTRAVENOUS | Status: AC
  Filled 2020-03-21: qty 500

## 2020-03-21 MED ORDER — BACITRACIN ZINC 500 UNIT/GM EX OINT
TOPICAL_OINTMENT | CUTANEOUS | Status: AC
  Filled 2020-03-21: qty 28.35

## 2020-03-21 MED ORDER — CLINDAMYCIN PHOSPHATE 600 MG/50ML IV SOLN
INTRAVENOUS | Status: DC | PRN
  Administered 2020-03-21: 600 mg via INTRAVENOUS

## 2020-03-21 MED ORDER — OXYCODONE-ACETAMINOPHEN 5-325 MG PO TABS: 1.0000 | ORAL_TABLET | ORAL | 0 refills | Status: AC | PRN

## 2020-03-21 MED ORDER — FENTANYL CITRATE (PF) 250 MCG/5ML IJ SOLN
INTRAMUSCULAR | Status: AC
  Filled 2020-03-21: qty 5

## 2020-03-21 MED ORDER — LIDOCAINE 2% (20 MG/ML) 5 ML SYRINGE
INTRAMUSCULAR | Status: DC | PRN
  Administered 2020-03-21: 60 mg via INTRAVENOUS

## 2020-03-21 MED ORDER — LIDOCAINE 2% (20 MG/ML) 5 ML SYRINGE
INTRAMUSCULAR | Status: AC
  Filled 2020-03-21: qty 5

## 2020-03-21 MED ORDER — LIDOCAINE-EPINEPHRINE 1 %-1:100000 IJ SOLN
INTRAMUSCULAR | Status: AC
  Filled 2020-03-21: qty 1

## 2020-03-21 MED ORDER — BUPIVACAINE LIPOSOME 1.3 % IJ SUSP
20.0000 mL | Freq: Once | INTRAMUSCULAR | Status: DC
  Filled 2020-03-21: qty 20

## 2020-03-21 MED ORDER — SODIUM CHLORIDE 0.9 % IV SOLN
0.0125 ug/kg/min | INTRAVENOUS | Status: AC
  Administered 2020-03-21: .1 ug/kg/min via INTRAVENOUS
  Filled 2020-03-21: qty 1000

## 2020-03-21 MED ORDER — DEXAMETHASONE SODIUM PHOSPHATE 10 MG/ML IJ SOLN
INTRAMUSCULAR | Status: AC
  Filled 2020-03-21: qty 1

## 2020-03-21 SURGICAL SUPPLY — 57 items
ATTRACTOMAT 16X20 MAGNETIC DRP (DRAPES) IMPLANT
BLADE SURG 15 STRL LF DISP TIS (BLADE) IMPLANT
BLADE SURG 15 STRL SS (BLADE)
BNDG CONFORM 2 STRL LF (GAUZE/BANDAGES/DRESSINGS) IMPLANT
BNDG GAUZE ELAST 4 BULKY (GAUZE/BANDAGES/DRESSINGS) IMPLANT
CANISTER SUCT 3000ML PPV (MISCELLANEOUS) IMPLANT
CATH ROBINSON RED A/P 16FR (CATHETERS) IMPLANT
CLEANER TIP ELECTROSURG 2X2 (MISCELLANEOUS) ×3 IMPLANT
CNTNR URN SCR LID CUP LEK RST (MISCELLANEOUS) ×1 IMPLANT
CONT SPEC 4OZ STRL OR WHT (MISCELLANEOUS) ×2
CORD BIPOLAR FORCEPS 12FT (ELECTRODE) ×3 IMPLANT
COVER SURGICAL LIGHT HANDLE (MISCELLANEOUS) ×3 IMPLANT
COVER WAND RF STERILE (DRAPES) IMPLANT
DERMABOND ADVANCED (GAUZE/BANDAGES/DRESSINGS) ×2
DERMABOND ADVANCED .7 DNX12 (GAUZE/BANDAGES/DRESSINGS) ×1 IMPLANT
DRAIN PENROSE 1/4X12 LTX STRL (WOUND CARE) IMPLANT
DRAIN SNY 10 ROU (WOUND CARE) IMPLANT
DRAIN SNY 7 FPER (WOUND CARE) IMPLANT
DRAPE HALF SHEET 40X57 (DRAPES) IMPLANT
DRSG EMULSION OIL 3X3 NADH (GAUZE/BANDAGES/DRESSINGS) IMPLANT
ELECT COATED BLADE 2.86 ST (ELECTRODE) ×3 IMPLANT
ELECT NEEDLE TIP 2.8 STRL (NEEDLE) IMPLANT
ELECT REM PT RETURN 9FT ADLT (ELECTROSURGICAL) ×3
ELECTRODE REM PT RTRN 9FT ADLT (ELECTROSURGICAL) ×1 IMPLANT
FORCEPS BIPOLAR SPETZLER 8 1.0 (NEUROSURGERY SUPPLIES) ×3 IMPLANT
GAUZE 4X4 16PLY RFD (DISPOSABLE) IMPLANT
GAUZE SPONGE 4X4 12PLY STRL (GAUZE/BANDAGES/DRESSINGS) IMPLANT
GLOVE ECLIPSE 7.5 STRL STRAW (GLOVE) ×3 IMPLANT
GOWN STRL REUS W/ TWL LRG LVL3 (GOWN DISPOSABLE) ×2 IMPLANT
GOWN STRL REUS W/ TWL XL LVL3 (GOWN DISPOSABLE) IMPLANT
GOWN STRL REUS W/TWL LRG LVL3 (GOWN DISPOSABLE) ×4
GOWN STRL REUS W/TWL XL LVL3 (GOWN DISPOSABLE)
KIT BASIN OR (CUSTOM PROCEDURE TRAY) ×3 IMPLANT
KIT TURNOVER KIT B (KITS) ×3 IMPLANT
NEEDLE 25GX 5/8IN NON SAFETY (NEEDLE) IMPLANT
NEEDLE HYPO 25GX1X1/2 BEV (NEEDLE) ×3 IMPLANT
NS IRRIG 1000ML POUR BTL (IV SOLUTION) ×3 IMPLANT
PAD ARMBOARD 7.5X6 YLW CONV (MISCELLANEOUS) ×6 IMPLANT
PENCIL BUTTON HOLSTER BLD 10FT (ELECTRODE) ×3 IMPLANT
PENCIL FOOT CONTROL (ELECTRODE) ×3 IMPLANT
POSITIONER HEAD DONUT 9IN (MISCELLANEOUS) ×3 IMPLANT
SUT CHROMIC 4 0 P 3 18 (SUTURE) IMPLANT
SUT ETHILON 4 0 PS 2 18 (SUTURE) IMPLANT
SUT ETHILON 5 0 P 3 18 (SUTURE)
SUT NYLON ETHILON 5-0 P-3 1X18 (SUTURE) IMPLANT
SUT SILK 2 0 SH (SUTURE) ×3 IMPLANT
SUT SILK 4 0 (SUTURE)
SUT SILK 4-0 18XBRD TIE 12 (SUTURE) IMPLANT
SUT VIC AB 4-0 PS2 18 (SUTURE) ×3 IMPLANT
SWAB COLLECTION DEVICE MRSA (MISCELLANEOUS) IMPLANT
SWAB CULTURE ESWAB REG 1ML (MISCELLANEOUS) IMPLANT
SYR BULB IRRIG 60ML STRL (SYRINGE) IMPLANT
SYR TB 1ML LUER SLIP (SYRINGE) IMPLANT
TOWEL GREEN STERILE FF (TOWEL DISPOSABLE) ×3 IMPLANT
TRAY ENT MC OR (CUSTOM PROCEDURE TRAY) ×3 IMPLANT
WATER STERILE IRR 1000ML POUR (IV SOLUTION) ×3 IMPLANT
YANKAUER SUCT BULB TIP NO VENT (SUCTIONS) IMPLANT

## 2020-03-21 NOTE — Anesthesia Procedure Notes (Signed)
Procedure Name: Intubation Performed by: Milford Cage, CRNA Pre-anesthesia Checklist: Patient identified, Emergency Drugs available, Suction available and Patient being monitored Patient Re-evaluated:Patient Re-evaluated prior to induction Oxygen Delivery Method: Circle System Utilized Preoxygenation: Pre-oxygenation with 100% oxygen Induction Type: IV induction Ventilation: Mask ventilation without difficulty Grade View: Grade I Tube type: Oral Tube size: 6.5 mm Number of attempts: 1 Airway Equipment and Method: Stylet Placement Confirmation: ETT inserted through vocal cords under direct vision,  positive ETCO2 and breath sounds checked- equal and bilateral Secured at: 20 cm Tube secured with: Tape Dental Injury: Teeth and Oropharynx as per pre-operative assessment

## 2020-03-21 NOTE — Anesthesia Postprocedure Evaluation (Signed)
Anesthesia Post Note  Patient: Hannah Mckee  Procedure(s) Performed: EXCISION ORAL MASS (N/A Mouth)     Patient location during evaluation: PACU Anesthesia Type: General Level of consciousness: awake and alert Pain management: pain level controlled Vital Signs Assessment: post-procedure vital signs reviewed and stable Respiratory status: spontaneous breathing, nonlabored ventilation, respiratory function stable and patient connected to nasal cannula oxygen Cardiovascular status: blood pressure returned to baseline and stable Postop Assessment: no apparent nausea or vomiting Anesthetic complications: no    Last Vitals:  Vitals:   03/21/20 1035 03/21/20 1050  BP: (!) 102/44 (!) 120/47  Pulse: 72 71  Resp: 14 18  Temp: (!) 36.3 C (!) 36.3 C  SpO2: 97% 95%    Last Pain:  Vitals:   03/21/20 1050  TempSrc:   PainSc: 0-No pain                 Tiajuana Amass

## 2020-03-21 NOTE — Discharge Instructions (Signed)
General Anesthesia, Adult, Care After This sheet gives you information about how to care for yourself after your procedure. Your health care provider may also give you more specific instructions. If you have problems or questions, contact your health care provider. What can I expect after the procedure? After the procedure, the following side effects are common:  Pain or discomfort at the IV site.  Nausea.  Vomiting.  Sore throat.  Trouble concentrating.  Feeling cold or chills.  Weak or tired.  Sleepiness and fatigue.  Soreness and body aches. These side effects can affect parts of the body that were not involved in surgery. Follow these instructions at home:  For at least 24 hours after the procedure:  Have a responsible adult stay with you. It is important to have someone help care for you until you are awake and alert.  Rest as needed.  Do not: ? Participate in activities in which you could fall or become injured. ? Drive. ? Use heavy machinery. ? Drink alcohol. ? Take sleeping pills or medicines that cause drowsiness. ? Make important decisions or sign legal documents. ? Take care of children on your own. Eating and drinking  Follow any instructions from your health care provider about eating or drinking restrictions.  When you feel hungry, start by eating small amounts of foods that are soft and easy to digest (bland), such as toast. Gradually return to your regular diet.  Drink enough fluid to keep your urine pale yellow.  If you vomit, rehydrate by drinking water, juice, or clear broth. General instructions  If you have sleep apnea, surgery and certain medicines can increase your risk for breathing problems. Follow instructions from your health care provider about wearing your sleep device: ? Anytime you are sleeping, including during daytime naps. ? While taking prescription pain medicines, sleeping medicines, or medicines that make you drowsy.  Return to  your normal activities as told by your health care provider. Ask your health care provider what activities are safe for you.  Take over-the-counter and prescription medicines only as told by your health care provider.  If you smoke, do not smoke without supervision.  Keep all follow-up visits as told by your health care provider. This is important. Contact a health care provider if:  You have nausea or vomiting that does not get better with medicine.  You cannot eat or drink without vomiting.  You have pain that does not get better with medicine.  You are unable to pass urine.  You develop a skin rash.  You have a fever.  You have redness around your IV site that gets worse. Get help right away if:  You have difficulty breathing.  You have chest pain.  You have blood in your urine or stool, or you vomit blood. Summary  After the procedure, it is common to have a sore throat or nausea. It is also common to feel tired.  Have a responsible adult stay with you for the first 24 hours after general anesthesia. It is important to have someone help care for you until you are awake and alert.  When you feel hungry, start by eating small amounts of foods that are soft and easy to digest (bland), such as toast. Gradually return to your regular diet.  Drink enough fluid to keep your urine pale yellow.  Return to your normal activities as told by your health care provider. Ask your health care provider what activities are safe for you. This information is not   intended to replace advice given to you by your health care provider. Make sure you discuss any questions you have with your health care provider. Document Revised: 10/21/2017 Document Reviewed: 06/03/2017 Elsevier Patient Education  [employer].  --------------  The patient may resume all her other activities and diet. She will follow up with Dr. Benjamine Mola in 1 week.

## 2020-03-21 NOTE — H&P (Signed)
Cc: Sublingual mass  HPI: The patient is an 86 year old female who returns today for follow-up evaluation of her sublingual mass.  The patient was last seen 1 month ago.  At that time, significant swelling was noted in the left sublingual area.  The patient was treated for possible sialoadenitis.  She was treated with warm compresses, manual massage, hydration and sialogogues.  However, the swelling has persisted.  She subsequently underwent a neck CT scan.  The CT showed a 2.1 cm enhancing soft tissue mass abutting the lower left alveolar ridge and communicating with the sublingual space.  The CT findings were concerning for a neoplasm.  The patient also has a history of bilateral profound hearing loss.  She is scheduled for her hearing aid fitting today.  The patient complains that the sublingual mass has interfered with her eating. It has interfered with her tongue movement.  She would like to have the mass removed.   Exam: General: Communicates without difficulty, well nourished, no acute distress. Head: Normocephalic, no evidence injury, no tenderness, facial buttresses intact without stepoff. Eyes: PERRL, EOMI. No scleral icterus, conjunctivae clear. Neuro: CN II exam reveals vision grossly intact. No nystagmus at any point of gaze. Ears: Auricles well formed without lesions. Ear canals are intact without mass or lesion. No erythema or edema is appreciated. The TMs are intact without fluid. Nose: External evaluation reveals normal support and skin without lesions. Dorsum is intact. Anterior rhinoscopy reveals healthy pink mucosa over anterior aspect of inferior turbinates and intact septum. No purulence noted. Oral:  Oral cavity and oropharynx are intact, symmetric, without erythema or edema. Mucosa is moist. A sublingual cystic mass is noted. Neck: Full range of motion without pain. There is no significant lymphadenopathy. No masses palpable. Thyroid bed within normal limits to palpation. Parotid glands  and submandibular glands equal bilaterally without mass. Trachea is midline. Neuro:  CN 2-12 grossly intact. Gait with walker. Vestibular: No nystagmus at any point of gaze. The cerebellar examination is unremarkable.   Assessment  1.   The patient has a 2 cm sublingual neoplasm.  On physical exam, the mass appears to be cystic.  No surface ulceration is noted.   2.  Bilateral severe to profound sensorineural hearing loss.    Plan 1.  The physical exam findings and the CT images are reviewed with the patient and her daughter.  2.  Based on the above findings, the patient will benefit from undergoing excision of the mass. The risks, benefits, alternatives and details of the procedure are reviewed with the patient.  3.  Due to the patient's medical morbidity, we will perform the surgery under local anesthesia.  4.  The patient is scheduled for hearing aid fitting to treat her hearing loss.

## 2020-03-21 NOTE — Op Note (Signed)
DATE OF PROCEDURE:  03/21/2020                              OPERATIVE REPORT  SURGEON:  Leta Baptist, MD  PREOPERATIVE DIAGNOSES: 1. Left sublingual mass.  POSTOPERATIVE DIAGNOSES: 1. Left sublingual mass.  PROCEDURE PERFORMED:  Excision of left sublingual mass.  ANESTHESIA:  General endotracheal tube anesthesia.  COMPLICATIONS:  None.  ESTIMATED BLOOD LOSS:  Minimal.  INDICATION FOR PROCEDURE:  ANNALISA TRESNER is a 86 y.o. female with a history of a left sublingual mass.  Her CT showed a 2.1 cm enhancing soft tissue mass abutting the lower left alveolar ridge and communicating with the sublingual space. The CT findings were concerning for a neoplasm.  Based on the above findings, the decision was made for the patient to undergo the excision procedure. The risks, benefits, alternatives, and details of the procedure were discussed with the patient.  Questions were invited and answered.  Informed consent was obtained.  DESCRIPTION:  The patient was taken to the operating room and placed supine on the operating table.  General endotracheal tube anesthesia was administered by the anesthesiologist.  The patient was positioned and prepped and draped in a standard fashion for oral surgery.  A  mouth gag was inserted into the oral cavity for exposure. 1% lidocaine was injected locally. A 2cm cystic mass was noted within the left sublingual space. A mucosal flap was elevated. A combination of sharp and blunt dissection was carried out to free the cystic mass from the surrounding soft tissue. The entire cystic mass was removed and sent to the pathology department.  The surgical sites were copiously irrigated.  The mouth gag was removed. The mucosal flap was closed with interrupted 4-0 vicryl sutures. The care of the patient was turned over to the anesthesiologist.  The patient was awakened from anesthesia without difficulty.  The patient was extubated and transferred to the recovery room in good  condition.  OPERATIVE FINDINGS:  A 2cm left sublingual cystic mass.  SPECIMEN:  Left sublingual cystic mass.  FOLLOWUP CARE:  The patient will be discharged home once awake and alert. The patient will follow up in my office in approximately 1 week.  Cormick Moss W Artha Chiasson 03/21/2020 10:23 AM

## 2020-03-21 NOTE — Transfer of Care (Signed)
Immediate Anesthesia Transfer of Care Note  Patient: Hannah Mckee  Procedure(s) Performed: EXCISION ORAL MASS (N/A Mouth)  Patient Location: PACU  Anesthesia Type:General  Level of Consciousness: awake  Airway & Oxygen Therapy: Patient Spontanous Breathing  Post-op Assessment: Report given to RN and Post -op Vital signs reviewed and stable  Post vital signs: Reviewed and stable  Last Vitals:  Vitals Value Taken Time  BP 102/44 03/21/20 1035  Temp    Pulse 72 03/21/20 1037  Resp 15 03/21/20 1037  SpO2 95 % 03/21/20 1037  Vitals shown include unvalidated device data.  Last Pain:  Vitals:   03/21/20 1035  TempSrc:   PainSc: 0-No pain      Patients Stated Pain Goal: 3 ([id number])  Complications: No apparent anesthesia complications

## 2020-03-24 LAB — SURGICAL PATHOLOGY

## 2020-03-28 ENCOUNTER — Other Ambulatory Visit: Payer: Self-pay

## 2020-03-28 ENCOUNTER — Other Ambulatory Visit (HOSPITAL_COMMUNITY): Payer: Self-pay | Admitting: Adult Health Nurse Practitioner

## 2020-03-28 ENCOUNTER — Ambulatory Visit (HOSPITAL_COMMUNITY)
Admission: RE | Admit: 2020-03-28 | Discharge: 2020-03-28 | Disposition: A | Discharge status: Home / Self Care | Payer: Medicare Other | Source: Ambulatory Visit | Attending: Adult Health Nurse Practitioner | Admitting: Adult Health Nurse Practitioner

## 2020-03-28 DIAGNOSIS — R059 Cough, unspecified: Secondary | ICD-10-CM

## 2020-03-28 DIAGNOSIS — R0602 Shortness of breath: Secondary | ICD-10-CM | POA: Diagnosis not present

## 2020-03-28 DIAGNOSIS — R05 Cough: Secondary | ICD-10-CM | POA: Diagnosis not present

## 2020-04-01 DIAGNOSIS — R05 Cough: Secondary | ICD-10-CM | POA: Diagnosis not present

## 2020-04-01 DIAGNOSIS — I2510 Atherosclerotic heart disease of native coronary artery without angina pectoris: Secondary | ICD-10-CM | POA: Diagnosis not present

## 2020-04-01 DIAGNOSIS — I5032 Chronic diastolic (congestive) heart failure: Secondary | ICD-10-CM | POA: Diagnosis not present

## 2020-04-01 DIAGNOSIS — I10 Essential (primary) hypertension: Secondary | ICD-10-CM | POA: Diagnosis not present

## 2020-04-01 DIAGNOSIS — R002 Palpitations: Secondary | ICD-10-CM | POA: Diagnosis not present

## 2020-04-07 ENCOUNTER — Telehealth: Payer: Self-pay | Admitting: *Deleted

## 2020-04-07 NOTE — Telephone Encounter (Signed)
Daughter informed and patient scheduled for Wednesday, April 09, 2020 @2 :00 pm.

## 2020-04-07 NOTE — Telephone Encounter (Signed)
Looks like open spots this week, if they are virtual im ok changing it to an in clinic slot   Zandra Abts MD

## 2020-04-07 NOTE — Telephone Encounter (Addendum)
Per daughter Gertie Baron), patient has seen her PCP Wende Neighbors) for SOB and says CXR was normal. Daughter is asking for an appointment with Dr. Harl Bowie. First available with Harl Bowie is 04/17/2020 for in office visit-refuses virtual. Michaelle Copas NP but declined. Denies chest pain or dizziness. Denies swelling. Daughter also says patient c/o fatigue. Per daughter, patient does not weigh daily but reports patient has lost weight. Reports vital signs have been normal. Medication reviewed. Advised that message would be sent to provider and if symptoms got worse, to take patient to the ED for an evaluation. Verbalized understanding.

## 2020-04-09 ENCOUNTER — Other Ambulatory Visit: Payer: Self-pay

## 2020-04-09 ENCOUNTER — Encounter: Payer: Self-pay | Admitting: Cardiology

## 2020-04-09 ENCOUNTER — Ambulatory Visit: Payer: Medicare Other | Admitting: Cardiology

## 2020-04-09 VITALS — BP 142/56 | HR 99 | Ht <= 58 in | Wt 101.4 lb

## 2020-04-09 DIAGNOSIS — R0602 Shortness of breath: Secondary | ICD-10-CM | POA: Diagnosis not present

## 2020-04-09 DIAGNOSIS — J438 Other emphysema: Secondary | ICD-10-CM | POA: Diagnosis not present

## 2020-04-09 NOTE — Progress Notes (Signed)
Clinical Summary Ms. Cairns is a 86 y.o.female seen today for follow up of the following medical problems.    1. ChronicdiastolicHF - echo 22/2979 LVEF 35-40%. Familyasked to deferischemic testing at that time. -f/u echo after medical therapy showed normalization of LVEF.04/2017 echo LVEF 89-21%, grade I diastolic dysfunction. Mild AI. - lisionpril caused rash.   09/2018 LVEF 19-41%, grade I diastolic dysfunction. - home weights 112 lbs. LE edema is up and down.    2. SOB - history of both COPD, CHF. -- some hypoxia on home O2 which she has as needed with some improvement - had oral surgery, cyst removed without troubles - 2 days later mucousy cough with golden/lime green. Took mucinex - did zpack by pcp, then started claritin.  - cough improving though ongoing. Breathing is up and down.  - tongue was numb and swollen.    3. COPD - previously followed by Dr Luan Pulling       Does not want covid vaccine.    Past Medical History:  Diagnosis Date  . Cancer (HCC)    Skin, basal  . CHF (congestive heart failure) ([place])   . Complication of anesthesia 1950's   'Years ago difficult awaking up"  . COPD (chronic obstructive pulmonary disease) ([place])   . Coronary artery calcification seen on CT scan    Multivessel  . Dyspnea    short of breath walking in home, breathing normal after sitting down  . History of blood transfusion   . History of cardiomyopathy    a. EF 35-40% by echo in 10/2016 b. improved to 55-60% by echo in 04/2017  . History of kidney stones   . History of shingles   . Hypertension   . Kyphosis    marked severity  . Osteoarthritis   . Pneumonia 10/04/2016  . Salivary gland stone   . Sepsis ([place])      Allergies  Allergen Reactions  . Lisinopril Hives  . Erythromycin Swelling  . Levaquin [Levofloxacin] Other (See Comments)    Tongue turns red and sore  . Penicillins Swelling    Has patient had a PCN reaction causing  immediate rash, facial/tongue/throat swelling, SOB or lightheadedness with hypotension: Yes Has patient had a PCN reaction causing severe rash involving mucus membranes or skin necrosis: No Has patient had a PCN reaction that required hospitalization No Has patient had a PCN reaction occurring within the last 10 years: No If all of the above answers are "NO", then may proceed with Cephalosporin use.   . Sulfa Antibiotics Other (See Comments)    Tongue turns red and sore     Current Outpatient Medications  Medication Sig Dispense Refill  . acetaminophen (TYLENOL) 500 MG tablet Take 1,000 mg by mouth every 6 (six) hours as needed for moderate pain.    Marland Kitchen amLODipine (NORVASC) 5 MG tablet TAKE 1 TABLET BY MOUTH DAILY (Patient taking differently: Take 5 mg by mouth daily. ) 90 tablet 2  . ANORO ELLIPTA 62.5-25 MCG/INH AEPB Inhale 1 puff into the lungs at bedtime.     . ciclopirox (LOPROX) 0.77 % cream Apply 1 application topically daily as needed (foot fungus).    . feeding supplement, ENSURE ENLIVE, (ENSURE ENLIVE) LIQD Take 237 mLs by mouth 2 (two) times daily between meals. (Patient taking differently: Take 118.5 mLs by mouth daily. ) 237 mL 12  . furosemide (LASIX) 40 MG tablet Take 1.5 tablets (60 mg total) by mouth daily. (May take an extra 1/2  tab as needed for severe swelling.) (Patient taking differently: Take 40 mg by mouth daily. ) 135 tablet 1  . ipratropium-albuterol (DUONEB) 0.5-2.5 (3) MG/3ML SOLN Take 3 mLs by nebulization every 4 (four) hours as needed.    . Iron-Vitamins (S.S.S. TONIC) LIQD Take 10 mLs by mouth daily.    . metoprolol succinate (TOPROL-XL) 100 MG 24 hr tablet Take 1 tablet (100 mg total) by mouth daily. 30 tablet 2  . nitroGLYCERIN (NITROSTAT) 0.4 MG SL tablet Place 1 tablet (0.4 mg total) under the tongue every 5 (five) minutes as needed for chest pain (CP or SOB). 25 tablet 3  . potassium chloride SA (KLOR-CON) 20 MEQ tablet Take 1 tablet (20 mEq total) by mouth  daily. Takes only when Furosemide is taken (Patient taking differently: Take 20 mEq by mouth daily. ) 90 tablet 1  . PROAIR HFA 108 (90 Base) MCG/ACT inhaler Inhale 1 puff into the lungs every 6 (six) hours as needed for wheezing or shortness of breath.   11   No current facility-administered medications for this visit.     Past Surgical History:  Procedure Laterality Date  . BLADDER SURGERY    . BUNIONECTOMY    . CATARACT EXTRACTION Bilateral   . CHOLECYSTECTOMY    . COLONOSCOPY  11/16/2012   Procedure: COLONOSCOPY;  Surgeon: Rogene Houston, MD;  [facility];  Service: Endoscopy;  Laterality: N/A;  100  . LITHOTRIPSY    . MASS EXCISION N/A 03/21/2020   Procedure: EXCISION ORAL MASS;  Surgeon: Leta Baptist, MD;  Location: [facility];  Service: ENT;  Laterality: N/A;  . ORIF PERIPROSTHETIC FRACTURE Right 10/29/2014   Procedure: OPEN REDUCTION INTERNAL FIXATION (ORIF) PERIPROSTHETIC FEMUR FRACTURE/FEMUR SHAFT;  Surgeon: Mcarthur Rossetti, MD;  Location: [facility];  Service: Orthopedics;  Laterality: Right;  . TONSILLECTOMY    . TOTAL HIP ARTHROPLASTY     1989     Allergies  Allergen Reactions  . Lisinopril Hives  . Erythromycin Swelling  . Levaquin [Levofloxacin] Other (See Comments)    Tongue turns red and sore  . Penicillins Swelling    Has patient had a PCN reaction causing immediate rash, facial/tongue/throat swelling, SOB or lightheadedness with hypotension: Yes Has patient had a PCN reaction causing severe rash involving mucus membranes or skin necrosis: No Has patient had a PCN reaction that required hospitalization No Has patient had a PCN reaction occurring within the last 10 years: No If all of the above answers are "NO", then may proceed with Cephalosporin use.   . Sulfa Antibiotics Other (See Comments)    Tongue turns red and sore      Family History  Problem Relation Age of Onset  . Hypertension Sister      Social History Ms. Kerschner reports that she  quit smoking about 23 years ago. Her smoking use included cigarettes. She has a 17.50 pack-year smoking history. She has never used smokeless tobacco. Ms. Nagele reports no history of alcohol use.   Review of Systems CONSTITUTIONAL: No weight loss, fever, chills, weakness or fatigue.  HEENT: Eyes: No visual loss, blurred vision, double vision or yellow sclerae.No hearing loss, sneezing, congestion, runny nose or sore throat.  SKIN: No rash or itching.  CARDIOVASCULAR: per hpi RESPIRATORY: No shortness of breath, cough or sputum.  GASTROINTESTINAL: No anorexia, nausea, vomiting or diarrhea. No abdominal pain or blood.  GENITOURINARY: No burning on urination, no polyuria NEUROLOGICAL: No headache, dizziness, syncope, paralysis, ataxia, numbness or tingling in  the extremities. No change in bowel or bladder control.  MUSCULOSKELETAL: No muscle, back pain, joint pain or stiffness.  LYMPHATICS: No enlarged nodes. No history of splenectomy.  PSYCHIATRIC: No history of depression or anxiety.  ENDOCRINOLOGIC: No reports of sweating, cold or heat intolerance. No polyuria or polydipsia.  Marland Kitchen   Physical Examination Today's Vitals   04/09/20 1401  BP: (!) 142/56  Pulse: 99  SpO2: 95%  Weight: 101 lb 6.4 oz (46 kg)  Height: 4\' 7"  (1.397 m)   Body mass index is 23.57 kg/m.  Gen: resting comfortably, no acute distress HEENT: no scleral icterus, pupils equal round and reactive, no palptable cervical adenopathy,  CV: RRR, no m/r/g, no jvd Resp: Clear to auscultation bilaterally GI: abdomen is soft, non-tender, non-distended, normal bowel sounds, no hepatosplenomegaly MSK: extremities are warm, no edema.  Skin: warm, no rash Neuro:  no focal deficits Psych: appropriate affect   Diagnostic Studies Jan 2020 nuclear stress  There was no ST segment deviation noted during stress.  Findings consistent with prior inferior/inferoseptal/septal myocardial infarction with moderate peri-infarct  ischemia.  This is an intermediate risk study.  The left ventricular ejection fraction is hyperdynamic (>65%    Assessment and Plan  1.ChronicdiastolicHF -appears euvolemic today. - continue current diuretic   2. SOB - from history appears to be possibly aspiration vs bronchitis, improving with abx. No signs of fluid overload as playing any role.    3. COPD - previously followed by Dr Luan Pulling, with his retirement she would like to establish with [facility] in [place], M.D

## 2020-04-09 NOTE — Patient Instructions (Signed)
Your physician recommends that you schedule a follow-up appointment in: [place] IN September  Your physician recommends that you continue on your current medications as directed. Please refer to the Current Medication list given to you today.  You have been referred to PULMONARY   Thank you for choosing [facility]!!

## 2020-04-10 ENCOUNTER — Ambulatory Visit: Payer: Medicare Other | Admitting: Cardiology

## 2020-04-11 DIAGNOSIS — I10 Essential (primary) hypertension: Secondary | ICD-10-CM | POA: Diagnosis not present

## 2020-04-11 DIAGNOSIS — Z96641 Presence of right artificial hip joint: Secondary | ICD-10-CM | POA: Diagnosis not present

## 2020-04-11 DIAGNOSIS — J449 Chronic obstructive pulmonary disease, unspecified: Secondary | ICD-10-CM | POA: Diagnosis not present

## 2020-04-11 DIAGNOSIS — I5021 Acute systolic (congestive) heart failure: Secondary | ICD-10-CM | POA: Diagnosis not present

## 2020-04-22 DIAGNOSIS — M545 Low back pain: Secondary | ICD-10-CM | POA: Diagnosis not present

## 2020-04-22 DIAGNOSIS — Z7409 Other reduced mobility: Secondary | ICD-10-CM | POA: Diagnosis not present

## 2020-05-07 DIAGNOSIS — I5032 Chronic diastolic (congestive) heart failure: Secondary | ICD-10-CM | POA: Diagnosis not present

## 2020-05-07 DIAGNOSIS — M1991 Primary osteoarthritis, unspecified site: Secondary | ICD-10-CM | POA: Diagnosis not present

## 2020-05-07 DIAGNOSIS — Z7982 Long term (current) use of aspirin: Secondary | ICD-10-CM | POA: Diagnosis not present

## 2020-05-07 DIAGNOSIS — M40209 Unspecified kyphosis, site unspecified: Secondary | ICD-10-CM | POA: Diagnosis not present

## 2020-05-07 DIAGNOSIS — I2510 Atherosclerotic heart disease of native coronary artery without angina pectoris: Secondary | ICD-10-CM | POA: Diagnosis not present

## 2020-05-07 DIAGNOSIS — Z9981 Dependence on supplemental oxygen: Secondary | ICD-10-CM | POA: Diagnosis not present

## 2020-05-07 DIAGNOSIS — I110 Hypertensive heart disease with heart failure: Secondary | ICD-10-CM | POA: Diagnosis not present

## 2020-05-07 DIAGNOSIS — J438 Other emphysema: Secondary | ICD-10-CM | POA: Diagnosis not present

## 2020-05-07 DIAGNOSIS — Z87891 Personal history of nicotine dependence: Secondary | ICD-10-CM | POA: Diagnosis not present

## 2020-05-07 DIAGNOSIS — Z87442 Personal history of urinary calculi: Secondary | ICD-10-CM | POA: Diagnosis not present

## 2020-05-07 DIAGNOSIS — I429 Cardiomyopathy, unspecified: Secondary | ICD-10-CM | POA: Diagnosis not present

## 2020-05-07 DIAGNOSIS — I10 Essential (primary) hypertension: Secondary | ICD-10-CM | POA: Diagnosis not present

## 2020-05-07 DIAGNOSIS — Z85828 Personal history of other malignant neoplasm of skin: Secondary | ICD-10-CM | POA: Diagnosis not present

## 2020-05-07 DIAGNOSIS — Z96649 Presence of unspecified artificial hip joint: Secondary | ICD-10-CM | POA: Diagnosis not present

## 2020-05-11 DIAGNOSIS — I5021 Acute systolic (congestive) heart failure: Secondary | ICD-10-CM | POA: Diagnosis not present

## 2020-05-11 DIAGNOSIS — J449 Chronic obstructive pulmonary disease, unspecified: Secondary | ICD-10-CM | POA: Diagnosis not present

## 2020-05-11 DIAGNOSIS — I10 Essential (primary) hypertension: Secondary | ICD-10-CM | POA: Diagnosis not present

## 2020-05-11 DIAGNOSIS — Z96641 Presence of right artificial hip joint: Secondary | ICD-10-CM | POA: Diagnosis not present

## 2020-05-15 DIAGNOSIS — M40209 Unspecified kyphosis, site unspecified: Secondary | ICD-10-CM | POA: Diagnosis not present

## 2020-05-15 DIAGNOSIS — J438 Other emphysema: Secondary | ICD-10-CM | POA: Diagnosis not present

## 2020-05-15 DIAGNOSIS — I2510 Atherosclerotic heart disease of native coronary artery without angina pectoris: Secondary | ICD-10-CM | POA: Diagnosis not present

## 2020-05-15 DIAGNOSIS — I5032 Chronic diastolic (congestive) heart failure: Secondary | ICD-10-CM | POA: Diagnosis not present

## 2020-05-15 DIAGNOSIS — Z96649 Presence of unspecified artificial hip joint: Secondary | ICD-10-CM | POA: Diagnosis not present

## 2020-05-15 DIAGNOSIS — I110 Hypertensive heart disease with heart failure: Secondary | ICD-10-CM | POA: Diagnosis not present

## 2020-05-15 DIAGNOSIS — Z87891 Personal history of nicotine dependence: Secondary | ICD-10-CM | POA: Diagnosis not present

## 2020-05-15 DIAGNOSIS — I429 Cardiomyopathy, unspecified: Secondary | ICD-10-CM | POA: Diagnosis not present

## 2020-05-15 DIAGNOSIS — M1991 Primary osteoarthritis, unspecified site: Secondary | ICD-10-CM | POA: Diagnosis not present

## 2020-05-15 DIAGNOSIS — Z9981 Dependence on supplemental oxygen: Secondary | ICD-10-CM | POA: Diagnosis not present

## 2020-05-15 DIAGNOSIS — Z85828 Personal history of other malignant neoplasm of skin: Secondary | ICD-10-CM | POA: Diagnosis not present

## 2020-05-15 DIAGNOSIS — Z87442 Personal history of urinary calculi: Secondary | ICD-10-CM | POA: Diagnosis not present

## 2020-05-15 DIAGNOSIS — Z7982 Long term (current) use of aspirin: Secondary | ICD-10-CM | POA: Diagnosis not present

## 2020-05-16 DIAGNOSIS — I2510 Atherosclerotic heart disease of native coronary artery without angina pectoris: Secondary | ICD-10-CM | POA: Diagnosis not present

## 2020-05-16 DIAGNOSIS — M40209 Unspecified kyphosis, site unspecified: Secondary | ICD-10-CM | POA: Diagnosis not present

## 2020-05-16 DIAGNOSIS — I5032 Chronic diastolic (congestive) heart failure: Secondary | ICD-10-CM | POA: Diagnosis not present

## 2020-05-16 DIAGNOSIS — I110 Hypertensive heart disease with heart failure: Secondary | ICD-10-CM | POA: Diagnosis not present

## 2020-05-16 DIAGNOSIS — I429 Cardiomyopathy, unspecified: Secondary | ICD-10-CM | POA: Diagnosis not present

## 2020-05-16 DIAGNOSIS — Z96649 Presence of unspecified artificial hip joint: Secondary | ICD-10-CM | POA: Diagnosis not present

## 2020-05-16 DIAGNOSIS — Z9981 Dependence on supplemental oxygen: Secondary | ICD-10-CM | POA: Diagnosis not present

## 2020-05-16 DIAGNOSIS — Z85828 Personal history of other malignant neoplasm of skin: Secondary | ICD-10-CM | POA: Diagnosis not present

## 2020-05-16 DIAGNOSIS — Z7982 Long term (current) use of aspirin: Secondary | ICD-10-CM | POA: Diagnosis not present

## 2020-05-16 DIAGNOSIS — Z87891 Personal history of nicotine dependence: Secondary | ICD-10-CM | POA: Diagnosis not present

## 2020-05-16 DIAGNOSIS — J438 Other emphysema: Secondary | ICD-10-CM | POA: Diagnosis not present

## 2020-05-16 DIAGNOSIS — Z87442 Personal history of urinary calculi: Secondary | ICD-10-CM | POA: Diagnosis not present

## 2020-05-16 DIAGNOSIS — M1991 Primary osteoarthritis, unspecified site: Secondary | ICD-10-CM | POA: Diagnosis not present

## 2020-05-20 DIAGNOSIS — Z87442 Personal history of urinary calculi: Secondary | ICD-10-CM | POA: Diagnosis not present

## 2020-05-20 DIAGNOSIS — Z9981 Dependence on supplemental oxygen: Secondary | ICD-10-CM | POA: Diagnosis not present

## 2020-05-20 DIAGNOSIS — Z7982 Long term (current) use of aspirin: Secondary | ICD-10-CM | POA: Diagnosis not present

## 2020-05-20 DIAGNOSIS — J438 Other emphysema: Secondary | ICD-10-CM | POA: Diagnosis not present

## 2020-05-20 DIAGNOSIS — I429 Cardiomyopathy, unspecified: Secondary | ICD-10-CM | POA: Diagnosis not present

## 2020-05-20 DIAGNOSIS — I2510 Atherosclerotic heart disease of native coronary artery without angina pectoris: Secondary | ICD-10-CM | POA: Diagnosis not present

## 2020-05-20 DIAGNOSIS — M1991 Primary osteoarthritis, unspecified site: Secondary | ICD-10-CM | POA: Diagnosis not present

## 2020-05-20 DIAGNOSIS — Z85828 Personal history of other malignant neoplasm of skin: Secondary | ICD-10-CM | POA: Diagnosis not present

## 2020-05-20 DIAGNOSIS — I110 Hypertensive heart disease with heart failure: Secondary | ICD-10-CM | POA: Diagnosis not present

## 2020-05-20 DIAGNOSIS — I5032 Chronic diastolic (congestive) heart failure: Secondary | ICD-10-CM | POA: Diagnosis not present

## 2020-05-20 DIAGNOSIS — M40209 Unspecified kyphosis, site unspecified: Secondary | ICD-10-CM | POA: Diagnosis not present

## 2020-05-20 DIAGNOSIS — Z96649 Presence of unspecified artificial hip joint: Secondary | ICD-10-CM | POA: Diagnosis not present

## 2020-05-20 DIAGNOSIS — Z87891 Personal history of nicotine dependence: Secondary | ICD-10-CM | POA: Diagnosis not present

## 2020-05-22 ENCOUNTER — Ambulatory Visit: Payer: Medicare Other | Admitting: Internal Medicine

## 2020-05-22 ENCOUNTER — Other Ambulatory Visit: Payer: Self-pay

## 2020-05-22 ENCOUNTER — Encounter: Payer: Self-pay | Admitting: Internal Medicine

## 2020-05-22 DIAGNOSIS — Z96649 Presence of unspecified artificial hip joint: Secondary | ICD-10-CM | POA: Diagnosis not present

## 2020-05-22 DIAGNOSIS — Z87442 Personal history of urinary calculi: Secondary | ICD-10-CM | POA: Diagnosis not present

## 2020-05-22 DIAGNOSIS — Z7982 Long term (current) use of aspirin: Secondary | ICD-10-CM | POA: Diagnosis not present

## 2020-05-22 DIAGNOSIS — Z85828 Personal history of other malignant neoplasm of skin: Secondary | ICD-10-CM | POA: Diagnosis not present

## 2020-05-22 DIAGNOSIS — M40209 Unspecified kyphosis, site unspecified: Secondary | ICD-10-CM | POA: Diagnosis not present

## 2020-05-22 DIAGNOSIS — Z9981 Dependence on supplemental oxygen: Secondary | ICD-10-CM | POA: Diagnosis not present

## 2020-05-22 DIAGNOSIS — J9611 Chronic respiratory failure with hypoxia: Secondary | ICD-10-CM

## 2020-05-22 DIAGNOSIS — R058 Other specified cough: Secondary | ICD-10-CM | POA: Insufficient documentation

## 2020-05-22 DIAGNOSIS — I5032 Chronic diastolic (congestive) heart failure: Secondary | ICD-10-CM | POA: Diagnosis not present

## 2020-05-22 DIAGNOSIS — R0609 Other forms of dyspnea: Secondary | ICD-10-CM

## 2020-05-22 DIAGNOSIS — I110 Hypertensive heart disease with heart failure: Secondary | ICD-10-CM | POA: Diagnosis not present

## 2020-05-22 DIAGNOSIS — R0600 Dyspnea, unspecified: Secondary | ICD-10-CM

## 2020-05-22 DIAGNOSIS — R05 Cough: Secondary | ICD-10-CM

## 2020-05-22 DIAGNOSIS — Z87891 Personal history of nicotine dependence: Secondary | ICD-10-CM | POA: Diagnosis not present

## 2020-05-22 DIAGNOSIS — I2510 Atherosclerotic heart disease of native coronary artery without angina pectoris: Secondary | ICD-10-CM | POA: Diagnosis not present

## 2020-05-22 DIAGNOSIS — J438 Other emphysema: Secondary | ICD-10-CM | POA: Diagnosis not present

## 2020-05-22 DIAGNOSIS — I429 Cardiomyopathy, unspecified: Secondary | ICD-10-CM | POA: Diagnosis not present

## 2020-05-22 DIAGNOSIS — M1991 Primary osteoarthritis, unspecified site: Secondary | ICD-10-CM | POA: Diagnosis not present

## 2020-05-22 MED ORDER — METOPROLOL SUCCINATE ER 100 MG PO TB24: ORAL_TABLET | ORAL | Status: DC

## 2020-05-22 NOTE — Assessment & Plan Note (Addendum)
Stopped smoking in 1998 s resp symptoms and onset doe 2010  - Echo 09/07/18  Left ventricle: The cavity size was normal. Wall thickness was  increased in a pattern of mild LVH. Systolic function was normal.  The estimated ejection fraction was in the range of 55% to 60%.  Wall motion was normal; there were no regional wall motion  abnormalities. Doppler parameters are consistent with abnormal  left ventricular relaxation (grade 1 diastolic dysfunction).  - Aortic valve: Moderately calcified annulus. Trileaflet; mildly  calcified leaflets. There was mild regurgitation.  - Mitral valve: Moderately calcified annulus MILD MR   - Left atrium: The atrium was moderately dilated.  - Right atrium: The atrium was at the upper limits of normal in  size. Central venous pressure (est): 3 mm Hg.  - Atrial septum: No defect or patent foramen ovale was identified.  - Tricuspid valve: There was mild regurgitation.  - Pulmonary arteries: PA peak pressure: 37 mm Hg (S).   When respiratory symptoms begin or become refractory well after a patient reports complete smoking cessation,  Especially when this wasn't the case while they were smoking, a red flag is raised based on the work of Dr Kris Mouton which states:  if you quit smoking when your best day FEV1 is still well preserved it is highly unlikely you will progress to severe disease.  That is to say, once the smoking stops,  the symptoms should not suddenly erupt or markedly worsen.  If so, the differential diagnosis should include  obesity/deconditioning,  LPR/Reflux/Aspiration syndromes,  occult CHF, or  especially side effect of medications commonly used in this population.    In her case most of her doe is likely related to kyphosis/ conditioning and component of diastolic dysfunction /MR > f/u Dr Harl Bowie    >>> try off anoro, on gerd diet and f/u in 3 months

## 2020-05-22 NOTE — Assessment & Plan Note (Signed)
As of 05/22/2020 just using 02 2lpm hs prn which appears adequate   Medical decision making was a moderate level of complexity in this case because of  two chronic conditions /diagnoses requiring extra time for  H and P, chart review, counseling, teaching elipta device   and generating customized AVS unique to this office visit and charting.   Each maintenance medication was reviewed in detail including emphasizing most importantly the difference between maintenance and prns and under what circumstances the prns are to be triggered using an action plan format where appropriate. Please see avs for details which were reviewed in writing by both me and my nurse and patient given a written copy highlighted where appropriate with yellow highlighter for the patient's continued care at home along with an updated version of their medications.  Patient was asked to maintain medication reconciliation by comparing this list to the actual medications being used at home and to contact this office right away if there is a conflict or discrepancy.

## 2020-05-22 NOTE — Progress Notes (Signed)
[name], female    DOB: January 25, 1931,    MRN: [id number]   Brief patient profile:  67 yowf quit smoking 1998 with onset of doe around  2010 dx as copd on various inhalers which tend to make her cough worse but improves always on duoneb referred to pulmonary clinic in [place]  05/22/2020 by Dr   Harl Bowie   History of Present Illness  05/22/2020  Pulmonary/ 1st office eval/Nandita Mathenia  Chief Complaint  Patient presents with  . Consult    Patient is here for shortness of breath with exertion. Has COPD Dry cough does not feel like she has got worse since seeing Dr. Luan Pulling last   Dyspnea: dollar tree ok using walker, more tired than sob  Cough: dec 2019 when moved in new appt and ever since dry to point of choking sensation / much worse p anoro hs   SABA use: twice weekly has used up to twice daily but that is  02  Has it to use is short of breath /sleeping 45 degrees  Never using 02  during the day   No obvious day to day or daytime variability or assoc excess/ purulent sputum or mucus plugs or hemoptysis or cp or chest tightness, subjective wheeze or overt sinus or hb symptoms.    . Also denies any obvious fluctuation of symptoms with weather or environmental changes or other aggravating or alleviating factors except as outlined above   No unusual exposure hx or h/o childhood pna/ asthma or knowledge of premature birth.  Current Allergies, Complete Past Medical History, Past Surgical History, Family History, and Social History were reviewed in [employer] record.  ROS  The following are not active complaints unless bolded Hoarseness, sore throat, dysphagia, dental problems, itching, sneezing,  nasal congestion or discharge of excess mucus or purulent secretions, ear ache,   fever, chills, sweats, unintended wt loss or wt gain, classically pleuritic or exertional cp,  orthopnea pnd or arm/hand swelling  or leg swelling, presyncope, palpitations, abdominal pain,  anorexia, nausea, vomiting, diarrhea  or change in bowel habits or change in bladder habits, change in stools or change in urine, dysuria, hematuria,  rash, arthralgias, visual complaints, headache, numbness, weakness or ataxia or problems with walking uses walker or coordination,  change in mood or  memory.           Past Medical History:  Diagnosis Date  . Cancer (HCC)    Skin, basal  . CHF (congestive heart failure) ([place])   . Complication of anesthesia 1950's   'Years ago difficult awaking up"  . COPD (chronic obstructive pulmonary disease) ([place])   . Coronary artery calcification seen on CT scan    Multivessel  . Dyspnea    short of breath walking in home, breathing normal after sitting down  . History of blood transfusion   . History of cardiomyopathy    a. EF 35-40% by echo in 10/2016 b. improved to 55-60% by echo in 04/2017  . History of kidney stones   . History of shingles   . Hypertension   . Kyphosis    marked severity  . Osteoarthritis   . Pneumonia 10/04/2016  . Salivary gland stone   . Sepsis [facility])     Outpatient Medications Prior to Visit  Medication Sig Dispense Refill  . acetaminophen (TYLENOL) 500 MG tablet Take 1,000 mg by mouth every 6 (six) hours as needed for moderate pain.    Marland Kitchen amLODipine (NORVASC) 5 MG  tablet TAKE 1 TABLET BY MOUTH DAILY (Patient taking differently: Take 5 mg by mouth daily. ) 90 tablet 2  . ANORO ELLIPTA 62.5-25 MCG/INH AEPB Inhale 1 puff into the lungs at bedtime.     . ciclopirox (LOPROX) 0.77 % cream Apply 1 application topically daily as needed (foot fungus).    . feeding supplement, ENSURE ENLIVE, (ENSURE ENLIVE) LIQD Take 237 mLs by mouth 2 (two) times daily between meals. (Patient taking differently: Take 118.5 mLs by mouth daily. ) 237 mL 12  . furosemide (LASIX) 40 MG tablet Take 1.5 tablets (60 mg total) by mouth daily. (May take an extra 1/2 tab as needed for severe swelling.) (Patient taking differently: Take 40 mg by mouth  daily. ) 135 tablet 1  . guaiFENesin (MUCINEX) 600 MG 12 hr tablet Take 600 mg by mouth daily.    Marland Kitchen ipratropium-albuterol (DUONEB) 0.5-2.5 (3) MG/3ML SOLN Take 3 mLs by nebulization every 4 (four) hours as needed.    . Iron-Vitamins (S.S.S. TONIC) LIQD Take 10 mLs by mouth daily.    Marland Kitchen loratadine (CLARITIN ALLERGY CHILDRENS) 5 MG/5ML syrup Take 10 mg by mouth daily.    . metoprolol succinate (TOPROL-XL) 100 MG 24 hr tablet Take 1 tablet (100 mg total) by mouth daily. 30 tablet 2  . nitroGLYCERIN (NITROSTAT) 0.4 MG SL tablet Place 1 tablet (0.4 mg total) under the tongue every 5 (five) minutes as needed for chest pain (CP or SOB). 25 tablet 3  . potassium chloride SA (KLOR-CON) 20 MEQ tablet Take 1 tablet (20 mEq total) by mouth daily. Takes only when Furosemide is taken (Patient taking differently: Take 20 mEq by mouth daily. ) 90 tablet 1  .     11      Objective:     BP (!) 132/60 (BP Location: Left Arm, Patient Position: Sitting, Cuff Size: Normal)   Pulse 92   Temp (!) 97.3 F (36.3 C) (Oral)   Ht 4\' 7"  (1.397 m)   Wt 100 lb 12.8 oz (45.7 kg)   SpO2 98%   BMI 23.43 kg/m   SpO2: 98 %   amb wf nad    HEENT : pt wearing mask not removed for exam due to covid -19 concerns.    NECK :  without JVD/Nodes/TM/ nl carotid upstrokes bilaterally   LUNGS: no acc muscle use,  Very severe kyphotic  chest which is clear to A and P bilaterally without cough on insp or exp maneuvers   CV:  RRR  no s3 or murmur or increase in P2, and no edema   ABD:  soft and nontender with nl inspiratory excursion in the supine position. No bruits or organomegaly appreciated, bowel sounds nl  MS:  Nl gait/ ext warm without deformities, calf tenderness, cyanosis or clubbing No obvious joint restrictions   SKIN: warm and dry without lesions    NEURO:  alert, approp, nl sensorium with  no motor or cerebellar deficits apparent.     I personally reviewed images and agree with radiology impression as  follows:  CXR:   Pa and lat  03/28/20 The heart size and mediastinal contours are within normal limits. Both lungs are clear. Very exaggerated thoracic kyphosis.      Assessment   Upper airway cough syndrome vs AB Onset 2019 p moved into new appt - reported worse on anoro > d/c 05/22/2020 and placed on gerd diet  Upper airway cough syndrome (previously labeled PNDS),  is so named because it's frequently  impossible to sort out how much is  CR/sinusitis with freq throat clearing (which can be related to primary GERD)   vs  causing  secondary (" extra esophageal")  GERD from wide swings in gastric pressure that occur with throat clearing, often  promoting self use of mint and menthol lozenges that reduce the lower esophageal sphincter tone and exacerbate the problem further in a cyclical fashion.   These are the same pts (now being labeled as having "irritable larynx syndrome" by some cough centers) who not infrequently have a history of having failed to tolerate ace inhibitors,  dry powder inhalers or biphosphonates or report having atypical/extraesophageal reflux symptoms that don't respond to standard doses of PPI  and are easily confused as having aecopd or asthma flares by even experienced allergists/ pulmonologists (myself included).   She insists "I don't have reflux" but statistically she likely does so rec diet and low threshold to start ppi bid ac if cough persists off anoro  >>> I n the event she has cough variant asthma, can always control with duoneb prn and call if use goes up off anoro and should change toprol from 100 mg daily to 50 mg bid to reduce peak BB effects/side effects which may include mild bronchospasm   - The proper method of use, as well as anticipated side effects, of an elipta  inhaler are discussed and demonstrated to the patient. Improved effectiveness after extensive coaching during this visit to a level of approximately 90 % from a baseline of 75 % but still caused  immediate cough so rec d/c for now       DOE (dyspnea on exertion) Stopped smoking in 1998 s resp symptoms and onset doe 2010  - Echo 09/07/18  Left ventricle: The cavity size was normal. Wall thickness was  increased in a pattern of mild LVH. Systolic function was normal.  The estimated ejection fraction was in the range of 55% to 60%.  Wall motion was normal; there were no regional wall motion  abnormalities. Doppler parameters are consistent with abnormal  left ventricular relaxation (grade 1 diastolic dysfunction).  - Aortic valve: Moderately calcified annulus. Trileaflet; mildly  calcified leaflets. There was mild regurgitation.  - Mitral valve: Moderately calcified annulus MILD MR   - Left atrium: The atrium was moderately dilated.  - Right atrium: The atrium was at the upper limits of normal in  size. Central venous pressure (est): 3 mm Hg.  - Atrial septum: No defect or patent foramen ovale was identified.  - Tricuspid valve: There was mild regurgitation.  - Pulmonary arteries: PA peak pressure: 37 mm Hg (S).   When respiratory symptoms begin or become refractory well after a patient reports complete smoking cessation,  Especially when this wasn't the case while they were smoking, a red flag is raised based on the work of Dr Kris Mouton which states:  if you quit smoking when your best day FEV1 is still well preserved it is highly unlikely you will progress to severe disease.  That is to say, once the smoking stops,  the symptoms should not suddenly erupt or markedly worsen.  If so, the differential diagnosis should include  obesity/deconditioning,  LPR/Reflux/Aspiration syndromes,  occult CHF, or  especially side effect of medications commonly used in this population.    In her case most of her doe is likely related to kyphosis/ conditioning and component of diastolic dysfunction /MR > f/u Dr Harl Bowie    >>> try off anoro, on gerd diet  and f/u in 3 months      Chronic respiratory failure with hypoxia ([place]) As of 05/22/2020 just using 02 2lpm hs prn which appears adequate   Medical decision making was a moderate level of complexity in this case because of  two chronic conditions /diagnoses requiring extra time for  H and P, chart review, counseling, teaching elipta device   and generating customized AVS unique to this office visit and charting.   Each maintenance medication was reviewed in detail including emphasizing most importantly the difference between maintenance and prns and under what circumstances the prns are to be triggered using an action plan format where appropriate. Please see avs for details which were reviewed in writing by both me and my nurse and patient given a written copy highlighted where appropriate with yellow highlighter for the patient's continued care at home along with an updated version of their medications.  Patient was asked to maintain medication reconciliation by comparing this list to the actual medications being used at home and to contact this office right away if there is a conflict or discrepancy.        Christinia Gully, MD 05/22/2020

## 2020-05-22 NOTE — Assessment & Plan Note (Addendum)
Onset 2019 p moved into new appt - reported worse on anoro > d/c 05/22/2020 and placed on gerd diet  Upper airway cough syndrome (previously labeled PNDS),  is so named because it's frequently impossible to sort out how much is  CR/sinusitis with freq throat clearing (which can be related to primary GERD)   vs  causing  secondary (" extra esophageal")  GERD from wide swings in gastric pressure that occur with throat clearing, often  promoting self use of mint and menthol lozenges that reduce the lower esophageal sphincter tone and exacerbate the problem further in a cyclical fashion.   These are the same pts (now being labeled as having "irritable larynx syndrome" by some cough centers) who not infrequently have a history of having failed to tolerate ace inhibitors,  dry powder inhalers or biphosphonates or report having atypical/extraesophageal reflux symptoms that don't respond to standard doses of PPI  and are easily confused as having aecopd or asthma flares by even experienced allergists/ pulmonologists (myself included).   She insists "I don't have reflux" but statistically she likely does so rec diet and low threshold to start ppi bid ac if cough persists off anoro  >>> I n the event she has cough variant asthma, can always control with duoneb prn and call if use goes up off anoro and should change toprol from 100 mg daily to 50 mg bid to reduce peak BB effects/side effects which may include mild bronchospasm   - The proper method of use, as well as anticipated side effects, of an elipta  inhaler are discussed and demonstrated to the patient. Improved effectiveness after extensive coaching during this visit to a level of approximately 90 % from a baseline of 75 % but still caused immediate cough so rec d/c for now

## 2020-05-22 NOTE — Patient Instructions (Addendum)
Plan A =  Stop anoro but if you find you are worse off it restart in am  GERD (REFLUX)  is an extremely common cause of respiratory symptoms just like yours , many times with no obvious heartburn at all.    It can be treated with medication, but also with lifestyle changes including elevation of the head of your bed (ideally with 6 -8inch blocks under the headboard of your bed),  Smoking cessation, avoidance of late meals, excessive alcohol, and avoid fatty foods, chocolate, peppermint, colas, red wine, and acidic juices such as orange juice.  NO MINT OR MENTHOL PRODUCTS SO NO COUGH DROPS  USE SUGARLESS CANDY INSTEAD (Jolley ranchers or Stover's or Life Savers) or even ice chips will also do - the key is to swallow to prevent all throat clearing. NO OIL BASED VITAMINS - use powdered substitutes.  Avoid fish oil when coughing.    Plan B = Backup (to supplement plan A, not to replace it) Only use your  albuterol-iprapropium nebulizer as a rescue medication to be used if you can't catch your breath by resting or doing a relaxed purse lip breathing pattern.  - The less you use it, the better it will work when you need it. - Ok to use up to  every 4 hours if you must but call for appointment if use goes up over your usual need   Change metaprolol to take 100 mg  one half twice daily  (50 mg twice daily = 100)    Please schedule a follow up visit in 3 months but call sooner if needed

## 2020-05-26 DIAGNOSIS — I2510 Atherosclerotic heart disease of native coronary artery without angina pectoris: Secondary | ICD-10-CM | POA: Diagnosis not present

## 2020-05-26 DIAGNOSIS — I429 Cardiomyopathy, unspecified: Secondary | ICD-10-CM | POA: Diagnosis not present

## 2020-05-26 DIAGNOSIS — I110 Hypertensive heart disease with heart failure: Secondary | ICD-10-CM | POA: Diagnosis not present

## 2020-05-26 DIAGNOSIS — I5032 Chronic diastolic (congestive) heart failure: Secondary | ICD-10-CM | POA: Diagnosis not present

## 2020-05-26 DIAGNOSIS — M1991 Primary osteoarthritis, unspecified site: Secondary | ICD-10-CM | POA: Diagnosis not present

## 2020-05-26 DIAGNOSIS — Z87442 Personal history of urinary calculi: Secondary | ICD-10-CM | POA: Diagnosis not present

## 2020-05-26 DIAGNOSIS — Z85828 Personal history of other malignant neoplasm of skin: Secondary | ICD-10-CM | POA: Diagnosis not present

## 2020-05-26 DIAGNOSIS — M40209 Unspecified kyphosis, site unspecified: Secondary | ICD-10-CM | POA: Diagnosis not present

## 2020-05-26 DIAGNOSIS — Z96649 Presence of unspecified artificial hip joint: Secondary | ICD-10-CM | POA: Diagnosis not present

## 2020-05-26 DIAGNOSIS — Z87891 Personal history of nicotine dependence: Secondary | ICD-10-CM | POA: Diagnosis not present

## 2020-05-26 DIAGNOSIS — Z9981 Dependence on supplemental oxygen: Secondary | ICD-10-CM | POA: Diagnosis not present

## 2020-05-26 DIAGNOSIS — J438 Other emphysema: Secondary | ICD-10-CM | POA: Diagnosis not present

## 2020-05-26 DIAGNOSIS — Z7982 Long term (current) use of aspirin: Secondary | ICD-10-CM | POA: Diagnosis not present

## 2020-06-04 DIAGNOSIS — I429 Cardiomyopathy, unspecified: Secondary | ICD-10-CM | POA: Diagnosis not present

## 2020-06-04 DIAGNOSIS — Z96649 Presence of unspecified artificial hip joint: Secondary | ICD-10-CM | POA: Diagnosis not present

## 2020-06-04 DIAGNOSIS — I5032 Chronic diastolic (congestive) heart failure: Secondary | ICD-10-CM | POA: Diagnosis not present

## 2020-06-04 DIAGNOSIS — M40209 Unspecified kyphosis, site unspecified: Secondary | ICD-10-CM | POA: Diagnosis not present

## 2020-06-04 DIAGNOSIS — Z87442 Personal history of urinary calculi: Secondary | ICD-10-CM | POA: Diagnosis not present

## 2020-06-04 DIAGNOSIS — Z9981 Dependence on supplemental oxygen: Secondary | ICD-10-CM | POA: Diagnosis not present

## 2020-06-04 DIAGNOSIS — J438 Other emphysema: Secondary | ICD-10-CM | POA: Diagnosis not present

## 2020-06-04 DIAGNOSIS — I2510 Atherosclerotic heart disease of native coronary artery without angina pectoris: Secondary | ICD-10-CM | POA: Diagnosis not present

## 2020-06-04 DIAGNOSIS — Z7982 Long term (current) use of aspirin: Secondary | ICD-10-CM | POA: Diagnosis not present

## 2020-06-04 DIAGNOSIS — M1991 Primary osteoarthritis, unspecified site: Secondary | ICD-10-CM | POA: Diagnosis not present

## 2020-06-04 DIAGNOSIS — Z87891 Personal history of nicotine dependence: Secondary | ICD-10-CM | POA: Diagnosis not present

## 2020-06-04 DIAGNOSIS — Z85828 Personal history of other malignant neoplasm of skin: Secondary | ICD-10-CM | POA: Diagnosis not present

## 2020-06-04 DIAGNOSIS — I110 Hypertensive heart disease with heart failure: Secondary | ICD-10-CM | POA: Diagnosis not present

## 2020-06-11 DIAGNOSIS — Z96641 Presence of right artificial hip joint: Secondary | ICD-10-CM | POA: Diagnosis not present

## 2020-06-11 DIAGNOSIS — I5021 Acute systolic (congestive) heart failure: Secondary | ICD-10-CM | POA: Diagnosis not present

## 2020-06-11 DIAGNOSIS — I10 Essential (primary) hypertension: Secondary | ICD-10-CM | POA: Diagnosis not present

## 2020-06-11 DIAGNOSIS — J449 Chronic obstructive pulmonary disease, unspecified: Secondary | ICD-10-CM | POA: Diagnosis not present

## 2020-06-17 DIAGNOSIS — M1991 Primary osteoarthritis, unspecified site: Secondary | ICD-10-CM | POA: Diagnosis not present

## 2020-06-17 DIAGNOSIS — Z9981 Dependence on supplemental oxygen: Secondary | ICD-10-CM | POA: Diagnosis not present

## 2020-06-17 DIAGNOSIS — Z7982 Long term (current) use of aspirin: Secondary | ICD-10-CM | POA: Diagnosis not present

## 2020-06-17 DIAGNOSIS — J438 Other emphysema: Secondary | ICD-10-CM | POA: Diagnosis not present

## 2020-06-17 DIAGNOSIS — Z85828 Personal history of other malignant neoplasm of skin: Secondary | ICD-10-CM | POA: Diagnosis not present

## 2020-06-17 DIAGNOSIS — I5032 Chronic diastolic (congestive) heart failure: Secondary | ICD-10-CM | POA: Diagnosis not present

## 2020-06-17 DIAGNOSIS — I429 Cardiomyopathy, unspecified: Secondary | ICD-10-CM | POA: Diagnosis not present

## 2020-06-17 DIAGNOSIS — Z96649 Presence of unspecified artificial hip joint: Secondary | ICD-10-CM | POA: Diagnosis not present

## 2020-06-17 DIAGNOSIS — Z87442 Personal history of urinary calculi: Secondary | ICD-10-CM | POA: Diagnosis not present

## 2020-06-17 DIAGNOSIS — I110 Hypertensive heart disease with heart failure: Secondary | ICD-10-CM | POA: Diagnosis not present

## 2020-06-17 DIAGNOSIS — Z87891 Personal history of nicotine dependence: Secondary | ICD-10-CM | POA: Diagnosis not present

## 2020-06-17 DIAGNOSIS — I2510 Atherosclerotic heart disease of native coronary artery without angina pectoris: Secondary | ICD-10-CM | POA: Diagnosis not present

## 2020-06-17 DIAGNOSIS — M40209 Unspecified kyphosis, site unspecified: Secondary | ICD-10-CM | POA: Diagnosis not present

## 2020-06-19 ENCOUNTER — Telehealth: Payer: Self-pay | Admitting: Cardiology

## 2020-06-19 NOTE — Telephone Encounter (Signed)
Dr Melvyn Novas changed pt Toprol XL to 50 mg bid instead of 100 mg daily with changes made to her inhalers - pt wanted to make sure this was ok with Dr Harl Bowie

## 2020-06-19 NOTE — Telephone Encounter (Signed)
Pt called wanting to discuss the medication changes her lung Dr. Arville Go  Please call [phone]

## 2020-06-20 MED ORDER — METOPROLOL SUCCINATE ER 50 MG PO TB24: 50.0000 mg | ORAL_TABLET | Freq: Two times a day (BID) | ORAL | Status: DC

## 2020-06-20 NOTE — Telephone Encounter (Signed)
That change is fine  Zandra Abts MD

## 2020-06-20 NOTE — Telephone Encounter (Signed)
Patient notified and verbalized understanding. 

## 2020-06-22 ENCOUNTER — Other Ambulatory Visit: Payer: Self-pay | Admitting: Cardiology

## 2020-07-02 DIAGNOSIS — I5032 Chronic diastolic (congestive) heart failure: Secondary | ICD-10-CM | POA: Diagnosis not present

## 2020-07-02 DIAGNOSIS — Z87891 Personal history of nicotine dependence: Secondary | ICD-10-CM | POA: Diagnosis not present

## 2020-07-02 DIAGNOSIS — M1991 Primary osteoarthritis, unspecified site: Secondary | ICD-10-CM | POA: Diagnosis not present

## 2020-07-02 DIAGNOSIS — J438 Other emphysema: Secondary | ICD-10-CM | POA: Diagnosis not present

## 2020-07-02 DIAGNOSIS — Z7982 Long term (current) use of aspirin: Secondary | ICD-10-CM | POA: Diagnosis not present

## 2020-07-02 DIAGNOSIS — I2510 Atherosclerotic heart disease of native coronary artery without angina pectoris: Secondary | ICD-10-CM | POA: Diagnosis not present

## 2020-07-02 DIAGNOSIS — I429 Cardiomyopathy, unspecified: Secondary | ICD-10-CM | POA: Diagnosis not present

## 2020-07-02 DIAGNOSIS — M40209 Unspecified kyphosis, site unspecified: Secondary | ICD-10-CM | POA: Diagnosis not present

## 2020-07-02 DIAGNOSIS — Z85828 Personal history of other malignant neoplasm of skin: Secondary | ICD-10-CM | POA: Diagnosis not present

## 2020-07-02 DIAGNOSIS — Z96649 Presence of unspecified artificial hip joint: Secondary | ICD-10-CM | POA: Diagnosis not present

## 2020-07-02 DIAGNOSIS — Z9981 Dependence on supplemental oxygen: Secondary | ICD-10-CM | POA: Diagnosis not present

## 2020-07-02 DIAGNOSIS — Z87442 Personal history of urinary calculi: Secondary | ICD-10-CM | POA: Diagnosis not present

## 2020-07-02 DIAGNOSIS — I110 Hypertensive heart disease with heart failure: Secondary | ICD-10-CM | POA: Diagnosis not present

## 2020-07-11 ENCOUNTER — Ambulatory Visit: Payer: Medicare Other | Admitting: Cardiology

## 2020-07-12 DIAGNOSIS — Z96641 Presence of right artificial hip joint: Secondary | ICD-10-CM | POA: Diagnosis not present

## 2020-07-12 DIAGNOSIS — I5021 Acute systolic (congestive) heart failure: Secondary | ICD-10-CM | POA: Diagnosis not present

## 2020-07-12 DIAGNOSIS — J449 Chronic obstructive pulmonary disease, unspecified: Secondary | ICD-10-CM | POA: Diagnosis not present

## 2020-07-12 DIAGNOSIS — I10 Essential (primary) hypertension: Secondary | ICD-10-CM | POA: Diagnosis not present

## 2020-08-11 DIAGNOSIS — J449 Chronic obstructive pulmonary disease, unspecified: Secondary | ICD-10-CM | POA: Diagnosis not present

## 2020-08-11 DIAGNOSIS — I5021 Acute systolic (congestive) heart failure: Secondary | ICD-10-CM | POA: Diagnosis not present

## 2020-08-11 DIAGNOSIS — Z96641 Presence of right artificial hip joint: Secondary | ICD-10-CM | POA: Diagnosis not present

## 2020-08-11 DIAGNOSIS — I10 Essential (primary) hypertension: Secondary | ICD-10-CM | POA: Diagnosis not present

## 2020-08-20 ENCOUNTER — Encounter: Payer: Self-pay | Admitting: Internal Medicine

## 2020-08-20 ENCOUNTER — Ambulatory Visit: Payer: Medicare Other | Admitting: Internal Medicine

## 2020-08-20 ENCOUNTER — Other Ambulatory Visit: Payer: Self-pay

## 2020-08-20 DIAGNOSIS — R058 Other specified cough: Secondary | ICD-10-CM | POA: Diagnosis not present

## 2020-08-20 DIAGNOSIS — R0609 Other forms of dyspnea: Secondary | ICD-10-CM

## 2020-08-20 DIAGNOSIS — J9611 Chronic respiratory failure with hypoxia: Secondary | ICD-10-CM

## 2020-08-20 DIAGNOSIS — R0600 Dyspnea, unspecified: Secondary | ICD-10-CM | POA: Diagnosis not present

## 2020-08-20 NOTE — Assessment & Plan Note (Signed)
Onset 2019 p moved into new appt - reported worse on anoro > d/c 05/22/2020 and placed on gerd diet - try off anoro again p one week taking in in am instead of hs   Reports cough worse in am p sleeping on non-humidified 02 and taking anoro at hs so rec  1) keep candy handy to promote swallowing  2) rechallenge with anoro in am only  3) Humdify 02  4) ent to re-eval if continues with what mostly appears to be mouth/ throat symptoms

## 2020-08-20 NOTE — Patient Instructions (Addendum)
Humidify your 02  - call adapt with questions    Plan A =  Change anoro to each am to see what it does to your mucus over about a week and in not improving go to Plan B   Plan B  = Stop anoro and just take the duoneb up to four times in 24 hours    GERD (REFLUX)  is an extremely common cause of respiratory symptoms just like yours , many times with no obvious heartburn at all.    It can be treated with medication, but also with lifestyle changes including elevation of the head of your bed (ideally with 6 -8inch blocks under the headboard of your bed),  Smoking cessation, avoidance of late meals, excessive alcohol, and avoid fatty foods, chocolate, peppermint, colas, red wine, and acidic juices such as orange juice.  NO MINT OR MENTHOL PRODUCTS SO NO COUGH DROPS  USE SUGARLESS CANDY INSTEAD (Jolley ranchers or Stover's or Life Savers) or even ice chips will also do - the key is to swallow to prevent all throat clearing. NO OIL BASED VITAMINS - use powdered substitutes.  Avoid fish oil when coughing.   If not making progress with your mouth / throat symptoms > see ent   Please schedule a follow up office visit in 6 weeks, call sooner if needed

## 2020-08-20 NOTE — Progress Notes (Signed)
Hannah Mckee, female    DOB: 1931-08-19,    MRN: [id number]   Brief patient profile:  77 yowf quit smoking 1998 with onset of doe around  2010 dx as copd on various inhalers which tend to make her cough worse but improves always on duoneb referred to pulmonary clinic in [place]  05/22/2020 by Dr   Harl Bowie   History of Present Illness  05/22/2020  Pulmonary/ 1st office eval/Hannah Mckee  Chief Complaint  Patient presents with  . Consult    Patient is here for shortness of breath with exertion. Has COPD Dry cough does not feel like she has got worse since seeing Dr. Luan Pulling last   Dyspnea: dollar tree ok using walker, more tired than sob  Cough: dec 2019 when moved in new appt and ever since dry to point of choking sensation / much worse p anoro hs   SABA use: twice weekly has used up to twice daily but that is  02  Has it to use is short of breath /sleeping 45 degrees  Never using 02  during the day  rec Plan A =  Stop anoro but if you find you are worse off it restart in am  GERD diet . Plan B = Backup (to supplement plan A, not to replace it) Only use your  albuterol-iprapropium nebulizer as a rescue medication  Change metaprolol to take 100 mg  one half twice daily  (50 mg twice daily = 100)    08/20/2020  f/u ov/Tymel Conely re: cough/ sob  uacs vs copd  Chief Complaint  Patient presents with  . Follow-up    Cough had worsened off Anoro and she started back taking it. Cough is about the same since her last visit.   Dyspnea:  anoro may be helping doe but making mucus thick and uses at hs  Cough: daytime / mucus really thick esp am "pulls it out in strings"  Makes her gag Sleeping: in recliner/ 45-60  degrees x years due to back  SABA use: uses more duoneb when stopped anoro "really helped" but back to not using neb  02: 02 2lpm hs only and  does not use humidity or check sats with activity    No obvious day to day or daytime variability or assoc  purulent sputum or mucus plugs or  hemoptysis or cp or chest tightness, subjective wheeze or overt sinus or hb symptoms.     Also denies any obvious fluctuation of symptoms with weather or environmental changes or other aggravating or alleviating factors except as outlined above   No unusual exposure hx or h/o childhood pna/ asthma or knowledge of premature birth.  Current Allergies, Complete Past Medical History, Past Surgical History, Family History, and Social History were reviewed in [employer] record.  ROS  The following are not active complaints unless bolded Hoarseness, sore throat, dysphagia, dental problems, itching, sneezing,  nasal congestion or discharge of excess mucus or purulent secretions, ear ache,   fever, chills, sweats, unintended wt loss or wt gain, classically pleuritic or exertional cp,  orthopnea pnd or arm/hand swelling  or leg swelling, presyncope, palpitations, abdominal pain, anorexia, nausea, vomiting, diarrhea  or change in bowel habits or change in bladder habits, change in stools or change in urine, dysuria, hematuria,  rash, arthralgias, visual complaints, headache, numbness, weakness or ataxia or problems with walking or coordination,  change in mood or  memory.          Current  Meds  Medication Sig  . acetaminophen (TYLENOL) 500 MG tablet Take 1,000 mg by mouth every 6 (six) hours as needed for moderate pain.  Marland Kitchen amLODipine (NORVASC) 5 MG tablet TAKE 1 TABLET BY MOUTH DAILY (Patient taking differently: Take 5 mg by mouth daily. )  . ciclopirox (LOPROX) 0.77 % cream Apply 1 application topically daily as needed (foot fungus).  . feeding supplement, ENSURE ENLIVE, (ENSURE ENLIVE) LIQD Take 237 mLs by mouth 2 (two) times daily between meals. (Patient taking differently: Take 118.5 mLs by mouth daily. )  . furosemide (LASIX) 40 MG tablet Take 1.5 tablets (60 mg total) by mouth daily. (May take an extra 1/2 tab as needed for severe swelling.) (Patient taking differently: Take 40  mg by mouth daily. )  . ipratropium-albuterol (DUONEB) 0.5-2.5 (3) MG/3ML SOLN Take 3 mLs by nebulization every 4 (four) hours as needed.  . Iron-Vitamins (S.S.S. TONIC) LIQD Take 10 mLs by mouth daily.  . metoprolol succinate (TOPROL-XL) 50 MG 24 hr tablet Take 1 tablet (50 mg total) by mouth in the morning and at bedtime. One half twice daily  . nitroGLYCERIN (NITROSTAT) 0.4 MG SL tablet Place 1 tablet (0.4 mg total) under the tongue every 5 (five) minutes as needed for chest pain (CP or SOB).  . potassium chloride SA (KLOR-CON) 20 MEQ tablet Take 1 tablet (20 mEq total) by mouth daily. Takes only when Furosemide is taken (Patient taking differently: Take 20 mEq by mouth daily. )  . umeclidinium-vilanterol (ANORO ELLIPTA) 62.5-25 MCG/INH AEPB Inhale 1 puff into the lungs daily.               Past Medical History:  Diagnosis Date  . Cancer (HCC)    Skin, basal  . CHF (congestive heart failure) ([place])   . Complication of anesthesia 1950's   'Years ago difficult awaking up"  . COPD (chronic obstructive pulmonary disease) ([place])   . Coronary artery calcification seen on CT scan    Multivessel  . Dyspnea    short of breath walking in home, breathing normal after sitting down  . History of blood transfusion   . History of cardiomyopathy    a. EF 35-40% by echo in 10/2016 b. improved to 55-60% by echo in 04/2017  . History of kidney stones   . History of shingles   . Hypertension   . Kyphosis    marked severity  . Osteoarthritis   . Pneumonia 10/04/2016  . Salivary gland stone   . Sepsis ([place])       Objective:    Wt Readings from Last 3 Encounters:  08/20/20 101 lb (45.8 kg)  05/22/20 100 lb 12.8 oz (45.7 kg)  04/09/20 101 lb 6.4 oz (46 kg)     Vital signs reviewed - Note on arrival 08/20/2020  02 sats  96% on RA  amb elderly wf walking on rollator      HEENT : pt wearing mask not removed for exam due to covid -19 concerns.    NECK :  without JVD/Nodes/TM/ nl carotid  upstrokes bilaterally   LUNGS: no acc muscle use, Severely kyphotic contour chest with somewhat distant bs  bilaterally without cough on insp or exp maneuvers   CV:  RRR  no s3 or murmur or increase in P2, and no edema   ABD:  soft and nontender with nl inspiratory excursion in the supine position. No bruits or organomegaly appreciated, bowel sounds nl  MS:  Walks bent over at waist on  rollator/ ext warm without deformities, calf tenderness, cyanosis or clubbing - No obvious joint restrictions   SKIN: warm and dry without lesions    NEURO:  alert, approp, nl sensorium with  no motor or cerebellar deficits apparent.                    Assessment                      DOE (dyspnea on exertion) Stopped smoking in 1998 s resp symptoms and onset doe 2010  - Echo 09/07/18  Left ventricle: The cavity size was normal. Wall thickness was  increased in a pattern of mild LVH. Systolic function was normal.  The estimated ejection fraction was in the range of 55% to 60%.  Wall motion was normal; there were no regional wall motion  abnormalities. Doppler parameters are consistent with abnormal  left ventricular relaxation (grade 1 diastolic dysfunction).  - Aortic valve: Moderately calcified annulus. Trileaflet; mildly  calcified leaflets. There was mild regurgitation.  - Mitral valve: Moderately calcified annulus MILD MR   - Left atrium: The atrium was moderately dilated.  - Right atrium: The atrium was at the upper limits of normal in  size. Central venous pressure (est): 3 mm Hg.  - Atrial septum: No defect or patent foramen ovale was identified.  - Tricuspid valve: There was mild regurgitation.  - Pulmonary arteries: PA peak pressure: 37 mm Hg (S).   When respiratory symptoms begin or become refractory well after a patient reports complete smoking cessation,  Especially when this wasn't the case while they were smoking, a red flag is raised based on the  work of Dr Kris Mouton which states:  if you quit smoking when your best day FEV1 is still well preserved it is highly unlikely you will progress to severe disease.  That is to say, once the smoking stops,  the symptoms should not suddenly erupt or markedly worsen.  If so, the differential diagnosis should include  obesity/deconditioning,  LPR/Reflux/Aspiration syndromes,  occult CHF, or  especially side effect of medications commonly used in this population.    In her case most of her doe is likely related to kyphosis/ conditioning and component of diastolic dysfunction /MR > f/u Dr Harl Bowie    >>> try off anoro, on gerd diet and f/u in 3 months     Chronic respiratory failure with hypoxia ([place]) As of 05/22/2020 just using 02 2lpm hs prn which appears adequate   Medical decision making was a moderate level of complexity in this case because of  two chronic conditions /diagnoses requiring extra time for  H and P, chart review, counseling, teaching elipta device   and generating customized AVS unique to this office visit and charting.   Each maintenance medication was reviewed in detail including emphasizing most importantly the difference between maintenance and prns and under what circumstances the prns are to be triggered using an action plan format where appropriate. Please see avs for details which were reviewed in writing by both me and my nurse and patient given a written copy highlighted where appropriate with yellow highlighter for the patient's continued care at home along with an updated version of their medications.  Patient was asked to maintain medication reconciliation by comparing this list to the actual medications being used at home and to contact this office right away if there is a conflict or discrepancy.        Christinia Gully, MD  05/22/2020   

## 2020-08-20 NOTE — Assessment & Plan Note (Addendum)
Stopped smoking in 1998 s resp symptoms and onset doe 2010  - Echo 09/07/18  Left ventricle: The cavity size was normal. Wall thickness was  increased in a pattern of mild LVH. Systolic function was normal.  The estimated ejection fraction was in the range of 55% to 60%.  Wall motion was normal; there were no regional wall motion  abnormalities. Doppler parameters are consistent with abnormal  left ventricular relaxation (grade 1 diastolic dysfunction).  - Aortic valve: Moderately calcified annulus. Trileaflet; mildly  calcified leaflets. There was mild regurgitation.  - Mitral valve: Moderately calcified annulus MILD MR   - Left atrium: The atrium was moderately dilated.  - Right atrium: The atrium was at the upper limits of normal in  size. Central venous pressure (est): 3 mm Hg.  - Atrial septum: No defect or patent foramen ovale was identified.  - Tricuspid valve: There was mild regurgitation.  - Pulmonary arteries: PA peak pressure: 37 mm Hg (S).   ? Some better doe on anoro but never used in am as rec and when stopped it she felt more need for duoneb but does not remember any change in cough  (see separate a/p)   rec Change anoro to 1 click each am then again off so see whether cough or sob are really affected and if sob worse then duoneb up to qid until sort out mech of cough as duoneb much less likely to induce cough than dpi  Strongly rec pfts to sort out mechanisms for doe but she declined and will call if changes mind

## 2020-08-20 NOTE — Assessment & Plan Note (Signed)
As of 08/20/2020 just using 2lpm hs but rec check with activity to assure > 90% sats          Each maintenance medication was reviewed in detail including emphasizing most importantly the difference between maintenance and prns and under what circumstances the prns are to be triggered using an action plan format where appropriate.  Total time for H and P, chart review, counseling, teaching device and generating customized AVS unique to this office visit / charting  = 45 min

## 2020-08-27 ENCOUNTER — Other Ambulatory Visit: Payer: Self-pay

## 2020-08-27 ENCOUNTER — Emergency Department (HOSPITAL_COMMUNITY): Payer: Medicare Other

## 2020-08-27 ENCOUNTER — Inpatient Hospital Stay (HOSPITAL_COMMUNITY)
Admission: EM | Admit: 2020-08-27 | Discharge: 2020-08-29 | DRG: 378 | Disposition: A | Discharge status: Home / Self Care | Payer: Medicare Other | Attending: Family Medicine | Admitting: Family Medicine

## 2020-08-27 ENCOUNTER — Encounter (HOSPITAL_COMMUNITY): Payer: Self-pay

## 2020-08-27 DIAGNOSIS — Z880 Allergy status to penicillin: Secondary | ICD-10-CM | POA: Diagnosis not present

## 2020-08-27 DIAGNOSIS — J9611 Chronic respiratory failure with hypoxia: Secondary | ICD-10-CM | POA: Diagnosis not present

## 2020-08-27 DIAGNOSIS — Z888 Allergy status to other drugs, medicaments and biological substances status: Secondary | ICD-10-CM

## 2020-08-27 DIAGNOSIS — J438 Other emphysema: Secondary | ICD-10-CM | POA: Diagnosis not present

## 2020-08-27 DIAGNOSIS — Z20822 Contact with and (suspected) exposure to covid-19: Secondary | ICD-10-CM | POA: Diagnosis present

## 2020-08-27 DIAGNOSIS — Z9981 Dependence on supplemental oxygen: Secondary | ICD-10-CM | POA: Diagnosis not present

## 2020-08-27 DIAGNOSIS — Z881 Allergy status to other antibiotic agents status: Secondary | ICD-10-CM

## 2020-08-27 DIAGNOSIS — D649 Anemia, unspecified: Secondary | ICD-10-CM

## 2020-08-27 DIAGNOSIS — Z9049 Acquired absence of other specified parts of digestive tract: Secondary | ICD-10-CM

## 2020-08-27 DIAGNOSIS — I5032 Chronic diastolic (congestive) heart failure: Secondary | ICD-10-CM | POA: Diagnosis present

## 2020-08-27 DIAGNOSIS — K922 Gastrointestinal hemorrhage, unspecified: Principal | ICD-10-CM

## 2020-08-27 DIAGNOSIS — J449 Chronic obstructive pulmonary disease, unspecified: Secondary | ICD-10-CM | POA: Diagnosis present

## 2020-08-27 DIAGNOSIS — I509 Heart failure, unspecified: Secondary | ICD-10-CM | POA: Diagnosis not present

## 2020-08-27 DIAGNOSIS — Z87891 Personal history of nicotine dependence: Secondary | ICD-10-CM | POA: Diagnosis not present

## 2020-08-27 DIAGNOSIS — M40204 Unspecified kyphosis, thoracic region: Secondary | ICD-10-CM | POA: Diagnosis not present

## 2020-08-27 DIAGNOSIS — I10 Essential (primary) hypertension: Secondary | ICD-10-CM | POA: Diagnosis not present

## 2020-08-27 DIAGNOSIS — Z882 Allergy status to sulfonamides status: Secondary | ICD-10-CM

## 2020-08-27 DIAGNOSIS — R0602 Shortness of breath: Secondary | ICD-10-CM | POA: Diagnosis not present

## 2020-08-27 DIAGNOSIS — K264 Chronic or unspecified duodenal ulcer with hemorrhage: Secondary | ICD-10-CM | POA: Diagnosis not present

## 2020-08-27 DIAGNOSIS — I499 Cardiac arrhythmia, unspecified: Secondary | ICD-10-CM | POA: Diagnosis not present

## 2020-08-27 DIAGNOSIS — K5909 Other constipation: Secondary | ICD-10-CM | POA: Diagnosis not present

## 2020-08-27 DIAGNOSIS — Z743 Need for continuous supervision: Secondary | ICD-10-CM | POA: Diagnosis not present

## 2020-08-27 DIAGNOSIS — I2510 Atherosclerotic heart disease of native coronary artery without angina pectoris: Secondary | ICD-10-CM | POA: Diagnosis not present

## 2020-08-27 DIAGNOSIS — I110 Hypertensive heart disease with heart failure: Secondary | ICD-10-CM | POA: Diagnosis not present

## 2020-08-27 DIAGNOSIS — K449 Diaphragmatic hernia without obstruction or gangrene: Secondary | ICD-10-CM | POA: Diagnosis not present

## 2020-08-27 DIAGNOSIS — E876 Hypokalemia: Secondary | ICD-10-CM | POA: Diagnosis present

## 2020-08-27 DIAGNOSIS — K222 Esophageal obstruction: Secondary | ICD-10-CM | POA: Diagnosis not present

## 2020-08-27 DIAGNOSIS — Z85828 Personal history of other malignant neoplasm of skin: Secondary | ICD-10-CM

## 2020-08-27 DIAGNOSIS — Z79899 Other long term (current) drug therapy: Secondary | ICD-10-CM

## 2020-08-27 DIAGNOSIS — D62 Acute posthemorrhagic anemia: Secondary | ICD-10-CM | POA: Diagnosis present

## 2020-08-27 DIAGNOSIS — Z791 Long term (current) use of non-steroidal anti-inflammatories (NSAID): Secondary | ICD-10-CM

## 2020-08-27 DIAGNOSIS — K921 Melena: Secondary | ICD-10-CM | POA: Diagnosis not present

## 2020-08-27 DIAGNOSIS — K5710 Diverticulosis of small intestine without perforation or abscess without bleeding: Secondary | ICD-10-CM | POA: Diagnosis present

## 2020-08-27 DIAGNOSIS — Z96649 Presence of unspecified artificial hip joint: Secondary | ICD-10-CM | POA: Diagnosis not present

## 2020-08-27 HISTORY — DX: Acute posthemorrhagic anemia: D62

## 2020-08-27 LAB — CBC WITH DIFFERENTIAL/PLATELET
Abs Immature Granulocytes: 0.07 10*3/uL (ref 0.00–0.07)
Basophils Absolute: 0.1 10*3/uL (ref 0.0–0.1)
Basophils Relative: 1 %
Eosinophils Absolute: 0.2 10*3/uL (ref 0.0–0.5)
Eosinophils Relative: 1 %
HCT: 26.3 % — ABNORMAL LOW (ref 36.0–46.0)
Hemoglobin: 8.5 g/dL — ABNORMAL LOW (ref 12.0–15.0)
Immature Granulocytes: 1 %
Lymphocytes Relative: 11 %
Lymphs Abs: 1.4 10*3/uL (ref 0.7–4.0)
MCH: 29.8 pg (ref 26.0–34.0)
MCHC: 32.3 g/dL (ref 30.0–36.0)
MCV: 92.3 fL (ref 80.0–100.0)
Monocytes Absolute: 0.9 10*3/uL (ref 0.1–1.0)
Monocytes Relative: 7 %
Neutro Abs: 10.7 10*3/uL — ABNORMAL HIGH (ref 1.7–7.7)
Neutrophils Relative %: 79 %
Platelets: 323 10*3/uL (ref 150–400)
RBC: 2.85 MIL/uL — ABNORMAL LOW (ref 3.87–5.11)
RDW: 14 % (ref 11.5–15.5)
WBC: 13.3 10*3/uL — ABNORMAL HIGH (ref 4.0–10.5)
nRBC: 0 % (ref 0.0–0.2)

## 2020-08-27 LAB — PROTIME-INR
INR: 1.1 (ref 0.8–1.2)
Prothrombin Time: 13.3 seconds (ref 11.4–15.2)

## 2020-08-27 LAB — POC OCCULT BLOOD, ED: Fecal Occult Bld: POSITIVE — AB

## 2020-08-27 LAB — COMPREHENSIVE METABOLIC PANEL
ALT: 11 U/L (ref 0–44)
AST: 16 U/L (ref 15–41)
Albumin: 3.3 g/dL — ABNORMAL LOW (ref 3.5–5.0)
Alkaline Phosphatase: 74 U/L (ref 38–126)
Anion gap: 11 (ref 5–15)
BUN: 28 mg/dL — ABNORMAL HIGH (ref 8–23)
CO2: 30 mmol/L (ref 22–32)
Calcium: 9.1 mg/dL (ref 8.9–10.3)
Chloride: 94 mmol/L — ABNORMAL LOW (ref 98–111)
Creatinine, Ser: 0.7 mg/dL (ref 0.44–1.00)
GFR, Estimated: >60 mL/min (ref >60)
Glucose, Bld: 110 mg/dL — ABNORMAL HIGH (ref 70–99)
Potassium: 2.8 mmol/L — ABNORMAL LOW (ref 3.5–5.1)
Sodium: 135 mmol/L (ref 135–145)
Total Bilirubin: 0.5 mg/dL (ref 0.3–1.2)
Total Protein: 6.8 g/dL (ref 6.5–8.1)

## 2020-08-27 LAB — MAGNESIUM: Magnesium: 1.9 mg/dL (ref 1.7–2.4)

## 2020-08-27 LAB — RESPIRATORY PANEL BY RT PCR (FLU A&B, COVID)
Influenza A by PCR: NEGATIVE
Influenza B by PCR: NEGATIVE
SARS Coronavirus 2 by RT PCR: NEGATIVE

## 2020-08-27 LAB — BPAM RBC
Blood Product Expiration Date: 202111252359
ISSUE DATE / TIME: 202110241522
Unit Type and Rh: 5100

## 2020-08-27 LAB — PREPARE RBC (CROSSMATCH)

## 2020-08-27 MED ORDER — POTASSIUM CHLORIDE 10 MEQ/100ML IV SOLN
10.0000 meq | Freq: Once | INTRAVENOUS | Status: AC
  Administered 2020-08-27: 10 meq via INTRAVENOUS
  Filled 2020-08-27: qty 100

## 2020-08-27 MED ORDER — MAGNESIUM SULFATE IN D5W 1-5 GM/100ML-% IV SOLN
1.0000 g | Freq: Once | INTRAVENOUS | Status: AC
  Administered 2020-08-27: 1 g via INTRAVENOUS
  Filled 2020-08-27: qty 100

## 2020-08-27 MED ORDER — SODIUM CHLORIDE 0.9 % IV SOLN
80.0000 mg | Freq: Once | INTRAVENOUS | Status: AC
  Administered 2020-08-27: 80 mg via INTRAVENOUS
  Filled 2020-08-27: qty 80

## 2020-08-27 MED ORDER — POTASSIUM CHLORIDE 20 MEQ PO PACK
40.0000 meq | PACK | Freq: Once | ORAL | Status: AC
  Administered 2020-08-27: 40 meq via ORAL
  Filled 2020-08-27: qty 2

## 2020-08-27 MED ORDER — SODIUM CHLORIDE 0.9 % IV SOLN: INTRAVENOUS | Status: DC

## 2020-08-27 MED ORDER — SODIUM CHLORIDE 0.9 % IV SOLN
8.0000 mg/h | INTRAVENOUS | Status: DC
  Administered 2020-08-27: 8 mg/h via INTRAVENOUS
  Filled 2020-08-27 (×5): qty 80

## 2020-08-27 MED ORDER — SODIUM CHLORIDE 0.9% IV SOLUTION: Freq: Once | INTRAVENOUS | Status: DC

## 2020-08-27 MED ORDER — UMECLIDINIUM-VILANTEROL 62.5-25 MCG/INH IN AEPB
1.0000 | INHALATION_SPRAY | Freq: Every day | RESPIRATORY_TRACT | Status: DC
  Administered 2020-08-28 – 2020-08-29 (×2): 1 via RESPIRATORY_TRACT
  Filled 2020-08-27: qty 14

## 2020-08-27 MED ORDER — IPRATROPIUM-ALBUTEROL 0.5-2.5 (3) MG/3ML IN SOLN: 3.0000 mL | RESPIRATORY_TRACT | Status: DC | PRN

## 2020-08-27 MED ORDER — METOPROLOL SUCCINATE ER 25 MG PO TB24
25.0000 mg | ORAL_TABLET | Freq: Two times a day (BID) | ORAL | Status: DC
  Administered 2020-08-28 – 2020-08-29 (×3): 25 mg via ORAL
  Filled 2020-08-27 (×3): qty 1

## 2020-08-27 MED ORDER — ACETAMINOPHEN 650 MG RE SUPP: 650.0000 mg | Freq: Four times a day (QID) | RECTAL | Status: DC | PRN

## 2020-08-27 MED ORDER — POTASSIUM CHLORIDE CRYS ER 20 MEQ PO TBCR
20.0000 meq | EXTENDED_RELEASE_TABLET | Freq: Once | ORAL | Status: AC
  Administered 2020-08-28: 20 meq via ORAL
  Filled 2020-08-27: qty 1

## 2020-08-27 MED ORDER — ACETAMINOPHEN 325 MG PO TABS: 650.0000 mg | ORAL_TABLET | Freq: Four times a day (QID) | ORAL | Status: DC | PRN

## 2020-08-27 NOTE — ED Notes (Signed)
Hospitalist at bedside 

## 2020-08-27 NOTE — H&P (Signed)
TR[facility] H&P   Patient Demographics:    Hannah Mckee, is a 86 y.o. female  MRN: [id number]   DOB - [birth year]  Admit Date - 08/27/2020  Outpatient Primary MD for the patient is Celene Squibb, MD  Referring MD/NP/PA: Dr Gilford Raid    Patient coming from: home  Chief Complaint  Patient presents with  . Abdominal Pain      HPI:    Hannah Mckee  is a 86 y.o. female,with a history of systolic CHF, COPD, chronic respiratory failure on 1.5 L at nighttime, hypertension, sialolithiasis , patient presents to ED secondary to shortness of breath and black stools, patient with dyspnea for few weeks currently, but her dyspnea has much worsened today, as well she does report her blood pressure was on the lower side at home, she called EMS earlier today, her vitals were stable, she was supposed to follow with PCP as an outpatient, few hours later, she does report an episode of abdominal pain, as well she was noted to have large episode of black tarry stool this afternoon, and she was Hemoccult positive in ED, patient reports she is on aspirin, she denies any NSAIDs use, she is on aspirin daily, she denies any coffee-ground emesis. -In ED her hemoglobin was noted to be 8.5, most recent was in 2019 at 12, he was Hemoccult positive, blood pressure was soft at 108/49, she had hypokalemia with potassium of 2.8, ED discussed with GI Dr. Melony Overly, who recommended admission for endoscopy.[facility] was consulted to admit.    Review of systems:    In addition to the HPI above, No Fever-chills, she report weakness and fatigue. No Headache, No changes with Vision or hearing, No problems swallowing food or Liquids, No Chest pain, Cough , she reports chronic dyspnea, but it worsened recently. No Abdominal pain, No Nausea or Vommitting, Bowel movements are regular, No Blood in Urine, she reports melena. No dysuria, No  new skin rashes or bruises, No new joints pains-aches,  No new weakness, tingling, numbness in any extremity, No recent weight gain or loss, No polyuria, polydypsia or polyphagia, No significant Mental Stressors.  A full 10 point Review of Systems was done, except as stated above, all other Review of Systems were negative.   With Past History of the following :    Past Medical History:  Diagnosis Date  . Acute blood loss anemia 08/27/2020  . Cancer (HCC)    Skin, basal  . CHF (congestive heart failure) ([place])   . Complication of anesthesia 1950's   'Years ago difficult awaking up"  . COPD (chronic obstructive pulmonary disease) ([place])   . Coronary artery calcification seen on CT scan    Multivessel  . Dyspnea    short of breath walking in home, breathing normal after sitting down  . History of blood transfusion   . History of cardiomyopathy    a. EF  35-40% by echo in 10/2016 b. improved to 55-60% by echo in 04/2017  . History of kidney stones   . History of shingles   . Hypertension   . Kyphosis    marked severity  . Osteoarthritis   . Pneumonia 10/04/2016  . Salivary gland stone   . Sepsis [facility])       Past Surgical History:  Procedure Laterality Date  . BLADDER SURGERY    . BUNIONECTOMY    . CATARACT EXTRACTION Bilateral   . CHOLECYSTECTOMY    . COLONOSCOPY  11/16/2012   Procedure: COLONOSCOPY;  Surgeon: Rogene Houston, MD;  [facility];  Service: Endoscopy;  Laterality: N/A;  100  . LITHOTRIPSY    . MASS EXCISION N/A 03/21/2020   Procedure: EXCISION ORAL MASS;  Surgeon: Leta Baptist, MD;  Location: [place];  Service: ENT;  Laterality: N/A;  . ORIF PERIPROSTHETIC FRACTURE Right 10/29/2014   Procedure: OPEN REDUCTION INTERNAL FIXATION (ORIF) PERIPROSTHETIC FEMUR FRACTURE/FEMUR SHAFT;  Surgeon: Mcarthur Rossetti, MD;  Location: [place];  Service: Orthopedics;  Laterality: Right;  . TONSILLECTOMY    . TOTAL HIP ARTHROPLASTY     1989      Social History:      Social History   Tobacco Use  . Smoking status: Former Smoker    Packs/day: 0.50    Years: 35.00    Pack years: 17.50    Types: Cigarettes    Quit date: 01/1997    Years since quitting: 23.5  . Smokeless tobacco: Never Used  Substance Use Topics  . Alcohol use: No      Family History :     Family History  Problem Relation Age of Onset  . Hypertension Sister      Home Medications:   Prior to Admission medications   Medication Sig Start Date End Date Taking? Authorizing Provider  acetaminophen (TYLENOL) 500 MG tablet Take 1,000 mg by mouth every 6 (six) hours as needed for moderate pain.    [provider]  amLODipine (NORVASC) 5 MG tablet TAKE 1 TABLET BY MOUTH DAILY Patient taking differently: Take 5 mg by mouth daily.  01/11/20   Arnoldo Lenis, MD  ciclopirox (LOPROX) 0.77 % cream Apply 1 application topically daily as needed (foot fungus).    [provider]  feeding supplement, ENSURE ENLIVE, (ENSURE ENLIVE) LIQD Take 237 mLs by mouth 2 (two) times daily between meals. Patient taking differently: Take 118.5 mLs by mouth daily.  10/11/16   Dhungel, Flonnie Overman, MD  furosemide (LASIX) 40 MG tablet Take 1.5 tablets (60 mg total) by mouth daily. (May take an extra 1/2 tab as needed for severe swelling.) Patient taking differently: Take 40 mg by mouth daily.  01/08/20   Arnoldo Lenis, MD  ipratropium-albuterol (DUONEB) 0.5-2.5 (3) MG/3ML SOLN Take 3 mLs by nebulization every 4 (four) hours as needed.    [provider]  Iron-Vitamins (S.S.S. TONIC) LIQD Take 10 mLs by mouth daily.    [provider]  metoprolol succinate (TOPROL-XL) 50 MG 24 hr tablet Take 1 tablet (50 mg total) by mouth in the morning and at bedtime. One half twice daily 06/20/20   Arnoldo Lenis, MD  nitroGLYCERIN (NITROSTAT) 0.4 MG SL tablet Place 1 tablet (0.4 mg total) under the tongue every 5 (five) minutes as needed for chest pain (CP or SOB). 01/08/20    Arnoldo Lenis, MD  potassium chloride SA (KLOR-CON) 20 MEQ tablet Take 1 tablet (20 mEq  total) by mouth daily. Takes only when Furosemide is taken Patient taking differently: Take 20 mEq by mouth daily.  01/25/20   Arnoldo Lenis, MD  umeclidinium-vilanterol (ANORO ELLIPTA) 62.5-25 MCG/INH AEPB Inhale 1 puff into the lungs daily.    [provider]     Allergies:     Allergies  Allergen Reactions  . Lisinopril Hives  . Erythromycin Swelling  . Levaquin [Levofloxacin] Other (See Comments)    Tongue turns red and sore  . Penicillins Swelling    Has patient had a PCN reaction causing immediate rash, facial/tongue/throat swelling, SOB or lightheadedness with hypotension: Yes Has patient had a PCN reaction causing severe rash involving mucus membranes or skin necrosis: No Has patient had a PCN reaction that required hospitalization No Has patient had a PCN reaction occurring within the last 10 years: No If all of the above answers are "NO", then may proceed with Cephalosporin use.   . Sulfa Antibiotics Other (See Comments)    Tongue turns red and sore     Physical Exam:   Vitals  Blood pressure (!) 126/51, pulse 90, temperature 98.1 F (36.7 C), temperature source Oral, resp. rate (!) 28, height 4\' 7"  (1.397 m), weight 44.9 kg, SpO2 100 %.   1. General  Frail elderly female lying in bed in NAD,   2. Normal affect and insight, Not Suicidal or Homicidal, Awake Alert, Oriented X 3.  3. No F.N deficits, ALL C.Nerves Intact, Strength 5/5 all 4 extremities, Sensation intact all 4 extremities, Plantars down going.  4. Ears and Eyes appear Normal, Conjunctivae clear, PERRLA. Moist Oral Mucosa.  5. Supple Neck, No JVD, No cervical lymphadenopathy appriciated, No Carotid Bruits.  6. Symmetrical Chest wall movement, Good air movement bilaterally, CTAB.  7. RRR, No Gallops, Rubs or Murmurs, No Parasternal Heave.  8. Positive Bowel Sounds, Abdomen Soft, No tenderness,  No organomegaly appriciated,No rebound -guarding or rigidity.  9.  No Cyanosis, Normal Skin Turgor, No Skin Rash or Bruise.  Patient has chronic right lower extremity trace edema status post her hip fracture.  10. Good muscle tone,  joints appear normal , no effusions, Normal ROM.  11. No Palpable Lymph Nodes in Neck or Axillae   Data Review:    CBC Recent Labs  Lab 08/27/20 1555  WBC 13.3*  HGB 8.5*  HCT 26.3*  PLT 323  MCV 92.3  MCH 29.8  MCHC 32.3  RDW 14.0  LYMPHSABS 1.4  MONOABS 0.9  EOSABS 0.2  BASOSABS 0.1   ------------------------------------------------------------------------------------------------------------------  Chemistries  Recent Labs  Lab 08/27/20 1555  NA 135  K 2.8*  CL 94*  CO2 30  GLUCOSE 110*  BUN 28*  CREATININE 0.70  CALCIUM 9.1  MG 1.9  AST 16  ALT 11  ALKPHOS 74  BILITOT 0.5   ------------------------------------------------------------------------------------------------------------------ estimated creatinine clearance is 30 mL/min (by C-G formula based on SCr of 0.7 mg/dL). ------------------------------------------------------------------------------------------------------------------ No results for input(s): TSH, T4TOTAL, T3FREE, THYROIDAB in the last 72 hours.  Invalid input(s): FREET3  Coagulation profile Recent Labs  Lab 08/27/20 1555  INR 1.1   ------------------------------------------------------------------------------------------------------------------- No results for input(s): DDIMER in the last 72 hours. -------------------------------------------------------------------------------------------------------------------  Cardiac Enzymes No results for input(s): CKMB, TROPONINI, MYOGLOBIN in the last 168 hours.  Invalid input(s): CK ------------------------------------------------------------------------------------------------------------------    Component Value Date/Time   BNP 202.0 (H) 09/06/2018 2045      ---------------------------------------------------------------------------------------------------------------  Urinalysis    Component Value Date/Time   COLORURINE STRAW (A) 05/23/2017 1151  APPEARANCEUR CLEAR 05/23/2017 1151   LABSPEC 1.005 05/23/2017 1151   PHURINE 7.0 05/23/2017 1151   GLUCOSEU NEGATIVE 05/23/2017 1151   HGBUR SMALL (A) 05/23/2017 1151   BILIRUBINUR NEGATIVE 05/23/2017 [place] 05/23/2017 1151   PROTEINUR NEGATIVE 05/23/2017 1151   NITRITE NEGATIVE 05/23/2017 1151   LEUKOCYTESUR NEGATIVE 05/23/2017 1151    ----------------------------------------------------------------------------------------------------------------   Imaging Results:    DG Chest 2 View  Result Date: 08/27/2020 CLINICAL DATA:  Pt sent over from doctors office for a gi bleed, pt states that she is having black tarry stools. Hx of HTN, COPD, CHF, and pneumonia in 2017. Former smoker. Covid test negative on 5/18. EXAM: CHEST - 2 VIEW COMPARISON:  03/28/2020 FINDINGS: Lungs are hyperinflated with attenuated bronchovascular markings as before. No focal infiltrate or overt edema. Heart size and mediastinal contours are within normal limits. Aortic Atherosclerosis (ICD10-170.0). No effusion.  No pneumothorax. Osteopenia with thoracic kyphosis. Right shoulder DJD. Cholecystectomy clips. IMPRESSION: Hyperinflation. No acute disease. Electronically Signed   By: Lucrezia Europe M.D.   On: 08/27/2020 16:35    My personal review of EKG: Rhythm NSR, Rate  94 /min, QTc 409 , no Acute ST changes   Assessment & Plan:    Active Problems:   Hypertension   COPD (chronic obstructive pulmonary disease) (HCC)   Hypokalemia   Chronic diastolic CHF (congestive heart failure) (HCC)   Acute blood loss anemia  Acute Blood loss anemia/symptomatic anemia -It is most likely in the setting of GI bleed, she is symptomatic with soft blood pressure, so she will be transfused 1 unit PRBC, will monitor  H&H closely and transfuse as needed. -Plan for endoscopy in a.m. as ED discussed with GI, meanwhile will keep on clear liquid diet, and n.p.o. after midnight, keep on Protonix drip,. -Hold aspirin  hypokalemia -Potassium was 2.8, repleted, she will be given IV magnesium as well.  Chronic systolic CHF -echo 27/0623 LVEF 35-40%. f/u echo after medical therapy showed normalization of LVEF.04/2017 echo LVEF 76-28%, grade I diastolic dysfunction. Mild AI. -Currently appears to be a euvolemic, and with soft blood pressure, I will hold her Lasix for now. -Continue with metoprolol but at a lower dose.  Essential hypertension -Continue metoprolol succinate at a lower dose, will hold Norvasc given soft blood pressure.  Chronic respiratory failure with hypoxia -Patient is normally on 1.5 L at nighttime -Stable presently  COPD -Stable without exacerbation -ContinueAnoro  DVT Prophylaxis  SCDs  AM Labs Ordered, also please review Full Orders  Family Communication: Admission, patients condition and plan of care including tests being ordered have been discussed with the patient and her at bedside who indicate understanding and agree with the plan and Code Status.  Code Status Full  Likely DC to  home  Condition GUARDED    Consults called: GI by ED    Admission status: inpatient    Time spent in minutes : 60 minutes   Phillips Climes M.D on 08/27/2020 at 7:00 PM   [facility] - Office  [phone]

## 2020-08-27 NOTE — ED Triage Notes (Signed)
Pt to er, pt sent over from doctors office for a gi bleed, pt state that she is having black tarry stools.

## 2020-08-27 NOTE — ED Provider Notes (Signed)
[facility] EMERGENCY DEPARTMENT Provider Note   CSN: [id number] Arrival date & time: 08/27/20  1513     History Chief Complaint  Patient presents with  . Abdominal Pain    Hannah Mckee is a 86 y.o. female.  Pt presents to the ED today with black stool and sob.  The pt said she's been sob for a few weeks.  Today, sob was much worse.  BP today also low at home.  Pt called EMS who came out to see her.  They did an EKG and checked vitals.  Everything ok, so they told her to see her doctor.  A few hrs later, she had some abdominal pain, then had a large black stool.  Her daughter took a picture and showed it to me.  There was a large volume black and tarry stool in the toilet.  Pt has never had a GI bleed in the past.  Dr. Laural Golden has done a colonoscopy in the past.  She is not on blood thinners.        Past Medical History:  Diagnosis Date  . Cancer (HCC)    Skin, basal  . CHF (congestive heart failure) ([place])   . Complication of anesthesia 1950's   'Years ago difficult awaking up"  . COPD (chronic obstructive pulmonary disease) ([place])   . Coronary artery calcification seen on CT scan    Multivessel  . Dyspnea    short of breath walking in home, breathing normal after sitting down  . History of blood transfusion   . History of cardiomyopathy    a. EF 35-40% by echo in 10/2016 b. improved to 55-60% by echo in 04/2017  . History of kidney stones   . History of shingles   . Hypertension   . Kyphosis    marked severity  . Osteoarthritis   . Pneumonia 10/04/2016  . Salivary gland stone   . Sepsis [facility])     Patient Active Problem List   Diagnosis Date Noted  . Upper airway cough syndrome vs AB 05/22/2020  . DOE (dyspnea on exertion) 05/22/2020  . Chronic respiratory failure with hypoxia ([place]) 09/07/2018  . Chronic systolic CHF (congestive heart failure) ([place]) 09/07/2018  . Chest pain 06/25/2017  . Chronic diastolic CHF (congestive heart failure) ([place]) 05/23/2017  .  COPD exacerbation ([place]) 10/11/2016  . Muscle weakness (generalized)   . Sinus tachycardia   . Lobar pneumonia, unspecified organism ([place])   . CAP (community acquired pneumonia) 10/04/2016  . Sepsis ([place]) 10/04/2016  . Acute on chronic respiratory failure with hypoxia ([place]) 10/04/2016  . Elevated troponin 10/04/2016  . Hypokalemia 10/04/2016  . Moderate malnutrition ([place]) 10/04/2016  . Right femoral fracture ([place]) 10/28/2014  . COPD (chronic obstructive pulmonary disease) ([place])   . Fall   . Unspecified constipation 04/16/2013  . Diverticulitis of colon without hemorrhage 04/16/2013  . Rectal bleed 11/06/2012  . Hypertension 11/06/2012    Past Surgical History:  Procedure Laterality Date  . BLADDER SURGERY    . BUNIONECTOMY    . CATARACT EXTRACTION Bilateral   . CHOLECYSTECTOMY    . COLONOSCOPY  11/16/2012   Procedure: COLONOSCOPY;  Surgeon: Rogene Houston, MD;  [facility];  Service: Endoscopy;  Laterality: N/A;  100  . LITHOTRIPSY    . MASS EXCISION N/A 03/21/2020   Procedure: EXCISION ORAL MASS;  Surgeon: Leta Baptist, MD;  Location: [place];  Service: ENT;  Laterality: N/A;  . ORIF PERIPROSTHETIC FRACTURE Right 10/29/2014  Procedure: OPEN REDUCTION INTERNAL FIXATION (ORIF) PERIPROSTHETIC FEMUR FRACTURE/FEMUR SHAFT;  Surgeon: Mcarthur Rossetti, MD;  Location: [place];  Service: Orthopedics;  Laterality: Right;  . TONSILLECTOMY    . TOTAL HIP ARTHROPLASTY     1989     OB History    Gravida      Para      Term      Preterm      AB      Living  4     SAB      TAB      Ectopic      Multiple      Live Births              Family History  Problem Relation Age of Onset  . Hypertension Sister     Social History   Tobacco Use  . Smoking status: Former Smoker    Packs/day: 0.50    Years: 35.00    Pack years: 17.50    Types: Cigarettes    Quit date: 01/1997    Years since quitting: 23.5  . Smokeless tobacco: Never Used  Vaping Use  .  Vaping Use: Never used  Substance Use Topics  . Alcohol use: No  . Drug use: No    Home Medications Prior to Admission medications   Medication Sig Start Date End Date Taking? Authorizing Provider  acetaminophen (TYLENOL) 500 MG tablet Take 1,000 mg by mouth every 6 (six) hours as needed for moderate pain.    [provider]  amLODipine (NORVASC) 5 MG tablet TAKE 1 TABLET BY MOUTH DAILY Patient taking differently: Take 5 mg by mouth daily.  01/11/20   Arnoldo Lenis, MD  ciclopirox (LOPROX) 0.77 % cream Apply 1 application topically daily as needed (foot fungus).    [provider]  feeding supplement, ENSURE ENLIVE, (ENSURE ENLIVE) LIQD Take 237 mLs by mouth 2 (two) times daily between meals. Patient taking differently: Take 118.5 mLs by mouth daily.  10/11/16   Dhungel, Flonnie Overman, MD  furosemide (LASIX) 40 MG tablet Take 1.5 tablets (60 mg total) by mouth daily. (May take an extra 1/2 tab as needed for severe swelling.) Patient taking differently: Take 40 mg by mouth daily.  01/08/20   Arnoldo Lenis, MD  ipratropium-albuterol (DUONEB) 0.5-2.5 (3) MG/3ML SOLN Take 3 mLs by nebulization every 4 (four) hours as needed.    [provider]  Iron-Vitamins (S.S.S. TONIC) LIQD Take 10 mLs by mouth daily.    [provider]  metoprolol succinate (TOPROL-XL) 50 MG 24 hr tablet Take 1 tablet (50 mg total) by mouth in the morning and at bedtime. One half twice daily 06/20/20   Arnoldo Lenis, MD  nitroGLYCERIN (NITROSTAT) 0.4 MG SL tablet Place 1 tablet (0.4 mg total) under the tongue every 5 (five) minutes as needed for chest pain (CP or SOB). 01/08/20   Arnoldo Lenis, MD  potassium chloride SA (KLOR-CON) 20 MEQ tablet Take 1 tablet (20 mEq total) by mouth daily. Takes only when Furosemide is taken Patient taking differently: Take 20 mEq by mouth daily.  01/25/20   Arnoldo Lenis, MD  umeclidinium-vilanterol (ANORO ELLIPTA) 62.5-25 MCG/INH AEPB Inhale 1  puff into the lungs daily.    [provider]    Allergies    Lisinopril, Erythromycin, Levaquin [levofloxacin], Penicillins, and Sulfa antibiotics  Review of Systems   Review of Systems  Gastrointestinal: Positive for abdominal pain.       Black  stool  All other systems reviewed and are negative.   Physical Exam Updated Vital Signs BP (!) 124/47   Pulse 92   Temp 98.1 F (36.7 C) (Oral)   Resp 16   Ht 4\' 7"  (1.397 m)   Wt 44.9 kg   SpO2 99%   BMI 23.01 kg/m   Physical Exam Vitals and nursing note reviewed.  Constitutional:      Appearance: She is well-developed.  HENT:     Head: Normocephalic and atraumatic.     Mouth/Throat:     Mouth: Mucous membranes are moist.     Pharynx: Oropharynx is clear.  Eyes:     Extraocular Movements: Extraocular movements intact.     Pupils: Pupils are equal, round, and reactive to light.  Cardiovascular:     Rate and Rhythm: Normal rate and regular rhythm.     Heart sounds: Normal heart sounds.  Pulmonary:     Effort: Pulmonary effort is normal.     Breath sounds: Normal breath sounds.  Abdominal:     General: Abdomen is flat. Bowel sounds are increased.     Palpations: Abdomen is soft.     Tenderness: There is no abdominal tenderness.  Skin:    General: Skin is warm.     Capillary Refill: Capillary refill takes less than 2 seconds.  Neurological:     General: No focal deficit present.     Mental Status: She is alert and oriented to person, place, and time.  Psychiatric:        Mood and Affect: Mood normal.        Behavior: Behavior normal.     ED Results / Procedures / Treatments   Labs (all labs ordered are listed, but only abnormal results are displayed) Labs Reviewed  COMPREHENSIVE METABOLIC PANEL - Abnormal; Notable for the following components:      Result Value   Potassium 2.8 (*)    Chloride 94 (*)    Glucose, Bld 110 (*)    BUN 28 (*)    Albumin 3.3 (*)    All other components within normal  limits  CBC WITH DIFFERENTIAL/PLATELET - Abnormal; Notable for the following components:   WBC 13.3 (*)    RBC 2.85 (*)    Hemoglobin 8.5 (*)    HCT 26.3 (*)    Neutro Abs 10.7 (*)    All other components within normal limits  POC OCCULT BLOOD, ED - Abnormal; Notable for the following components:   Fecal Occult Bld POSITIVE (*)    All other components within normal limits  RESPIRATORY PANEL BY RT PCR (FLU A&B, COVID)  PROTIME-INR  MAGNESIUM  TYPE AND SCREEN  PREPARE RBC (CROSSMATCH)    EKG EKG Interpretation  Date/Time:  Wednesday August 27 2020 17:42:48 EDT Ventricular Rate:  94 PR Interval:    QRS Duration: 82 QT Interval:  327 QTC Calculation: 409 R Axis:   58 Text Interpretation: Sinus rhythm Ventricular premature complex Borderline low voltage, extremity leads Nonspecific repol abnormality, lateral leads No significant change since last tracing Confirmed by Isla Pence [phone]) on 08/27/2020 6:06:03 PM   Radiology DG Chest 2 View  Result Date: 08/27/2020 CLINICAL DATA:  Pt sent over from doctors office for a gi bleed, pt states that she is having black tarry stools. Hx of HTN, COPD, CHF, and pneumonia in 2017. Former smoker. Covid test negative on 5/18. EXAM: CHEST - 2 VIEW COMPARISON:  03/28/2020 FINDINGS: Lungs are hyperinflated with attenuated bronchovascular markings as  before. No focal infiltrate or overt edema. Heart size and mediastinal contours are within normal limits. Aortic Atherosclerosis (ICD10-170.0). No effusion.  No pneumothorax. Osteopenia with thoracic kyphosis. Right shoulder DJD. Cholecystectomy clips. IMPRESSION: Hyperinflation. No acute disease. Electronically Signed   By: Lucrezia Europe M.D.   On: 08/27/2020 16:35    Procedures Procedures (including critical care time)  Medications Ordered in ED Medications  0.9 %  sodium chloride infusion ( Intravenous New Bag/Given 08/27/20 1644)  pantoprazole (PROTONIX) 80 mg in sodium chloride 0.9 % 100 mL  (0.8 mg/mL) infusion (8 mg/hr Intravenous New Bag/Given 08/27/20 1713)  potassium chloride 10 mEq in 100 mL IVPB (10 mEq Intravenous New Bag/Given 08/27/20 1746)  0.9 %  sodium chloride infusion (Manually program via Guardrails IV Fluids) (has no administration in time range)  pantoprazole (PROTONIX) 80 mg in sodium chloride 0.9 % 100 mL IVPB (0 mg Intravenous Stopped 08/27/20 1715)    ED Course  I have reviewed the triage vital signs and the nursing notes.  Pertinent labs & imaging results that were available during my care of the patient were reviewed by me and considered in my medical decision making (see chart for details).    MDM Rules/Calculators/A&P                          K is low, so that is replaced.  Hgb has dropped from 12 in 2019 to 8.5.  Pt is symptomatic, so I ordered 1 unit for transfusion.  Pt d/w Dr. Laural Golden (GI).  NPO after midnight.  EGD tomorrow.  Protonix drip.  Pt d/w Dr. Waldron [facility] (triad) for admission.  Covid negative.  CRITICAL CARE Performed by: Isla Pence   Total critical care time: 30 minutes  Critical care time was exclusive of separately billable procedures and treating other patients.  Critical care was necessary to treat or prevent imminent or life-threatening deterioration.  Critical care was time spent personally by me on the following activities: development of treatment plan with patient and/or surrogate as well as nursing, discussions with consultants, evaluation of patient's response to treatment, examination of patient, obtaining history from patient or surrogate, ordering and performing treatments and interventions, ordering and review of laboratory studies, ordering and review of radiographic studies, pulse oximetry and re-evaluation of patient's condition. Final Clinical Impression(s) / ED Diagnoses Final diagnoses:  GI bleed  Upper GI bleed  Symptomatic anemia  Hypokalemia    Rx / DC Orders ED Discharge Orders    None        Isla Pence, MD 08/27/20 1807

## 2020-08-28 ENCOUNTER — Inpatient Hospital Stay (HOSPITAL_COMMUNITY): Payer: Medicare Other | Admitting: Anesthesiology

## 2020-08-28 ENCOUNTER — Encounter (HOSPITAL_COMMUNITY)
Admission: EM | Disposition: A | Discharge status: Home / Self Care | Payer: Self-pay | Source: Home / Self Care | Attending: Family Medicine

## 2020-08-28 ENCOUNTER — Encounter (HOSPITAL_COMMUNITY): Payer: Self-pay | Admitting: Internal Medicine

## 2020-08-28 DIAGNOSIS — E876 Hypokalemia: Secondary | ICD-10-CM

## 2020-08-28 DIAGNOSIS — K921 Melena: Secondary | ICD-10-CM

## 2020-08-28 DIAGNOSIS — K222 Esophageal obstruction: Secondary | ICD-10-CM | POA: Diagnosis not present

## 2020-08-28 DIAGNOSIS — I509 Heart failure, unspecified: Secondary | ICD-10-CM | POA: Diagnosis not present

## 2020-08-28 DIAGNOSIS — I110 Hypertensive heart disease with heart failure: Secondary | ICD-10-CM | POA: Diagnosis not present

## 2020-08-28 DIAGNOSIS — D62 Acute posthemorrhagic anemia: Secondary | ICD-10-CM | POA: Diagnosis not present

## 2020-08-28 DIAGNOSIS — K922 Gastrointestinal hemorrhage, unspecified: Secondary | ICD-10-CM

## 2020-08-28 DIAGNOSIS — K449 Diaphragmatic hernia without obstruction or gangrene: Secondary | ICD-10-CM | POA: Diagnosis not present

## 2020-08-28 DIAGNOSIS — D649 Anemia, unspecified: Secondary | ICD-10-CM

## 2020-08-28 HISTORY — PX: BIOPSY: SHX5522

## 2020-08-28 HISTORY — PX: ESOPHAGOGASTRODUODENOSCOPY (EGD) WITH PROPOFOL: SHX5813

## 2020-08-28 LAB — CBC
HCT: 25.9 % — ABNORMAL LOW (ref 36.0–46.0)
Hemoglobin: 8 g/dL — ABNORMAL LOW (ref 12.0–15.0)
MCH: 27.8 pg (ref 26.0–34.0)
MCHC: 30.9 g/dL (ref 30.0–36.0)
MCV: 89.9 fL (ref 80.0–100.0)
Platelets: 241 10*3/uL (ref 150–400)
RBC: 2.88 MIL/uL — ABNORMAL LOW (ref 3.87–5.11)
RDW: 17.1 % — ABNORMAL HIGH (ref 11.5–15.5)
WBC: 10.9 10*3/uL — ABNORMAL HIGH (ref 4.0–10.5)
nRBC: 0 % (ref 0.0–0.2)

## 2020-08-28 LAB — BASIC METABOLIC PANEL
Anion gap: 6 (ref 5–15)
BUN: 22 mg/dL (ref 8–23)
CO2: 25 mmol/L (ref 22–32)
Calcium: 8.3 mg/dL — ABNORMAL LOW (ref 8.9–10.3)
Chloride: 108 mmol/L (ref 98–111)
Creatinine, Ser: 0.63 mg/dL (ref 0.44–1.00)
GFR, Estimated: >60 mL/min (ref >60)
Glucose, Bld: 100 mg/dL — ABNORMAL HIGH (ref 70–99)
Potassium: 3.9 mmol/L (ref 3.5–5.1)
Sodium: 139 mmol/L (ref 135–145)

## 2020-08-28 LAB — HEMOGLOBIN AND HEMATOCRIT, BLOOD
HCT: 29 % — ABNORMAL LOW (ref 36.0–46.0)
Hemoglobin: 8.9 g/dL — ABNORMAL LOW (ref 12.0–15.0)

## 2020-08-28 LAB — GLUCOSE, CAPILLARY: Glucose-Capillary: 93 mg/dL (ref 70–99)

## 2020-08-28 SURGERY — ESOPHAGOGASTRODUODENOSCOPY (EGD) WITH PROPOFOL
Anesthesia: General

## 2020-08-28 MED ORDER — PROPOFOL 10 MG/ML IV BOLUS
INTRAVENOUS | Status: DC | PRN
  Administered 2020-08-28: 50 mg via INTRAVENOUS
  Administered 2020-08-28 (×2): 10 mg via INTRAVENOUS
  Administered 2020-08-28: 20 mg via INTRAVENOUS
  Administered 2020-08-28: 10 mg via INTRAVENOUS

## 2020-08-28 MED ORDER — PANTOPRAZOLE SODIUM 40 MG PO TBEC
40.0000 mg | DELAYED_RELEASE_TABLET | Freq: Every day | ORAL | Status: DC
  Administered 2020-08-28 – 2020-08-29 (×2): 40 mg via ORAL
  Filled 2020-08-28 (×3): qty 1

## 2020-08-28 MED ORDER — SODIUM CHLORIDE 0.9 % IV SOLN: INTRAVENOUS | Status: DC

## 2020-08-28 MED ORDER — LACTATED RINGERS IV SOLN: INTRAVENOUS | Status: DC

## 2020-08-28 MED ORDER — STERILE WATER FOR IRRIGATION IR SOLN
Status: DC | PRN
  Administered 2020-08-28: 100 mL

## 2020-08-28 MED ORDER — LIDOCAINE HCL (CARDIAC) PF 100 MG/5ML IV SOSY
PREFILLED_SYRINGE | INTRAVENOUS | Status: DC | PRN
  Administered 2020-08-28: 60 mg via INTRATRACHEAL

## 2020-08-28 NOTE — Anesthesia Postprocedure Evaluation (Signed)
Anesthesia Post Note  Patient: Hannah Mckee  Procedure(s) Performed: ESOPHAGOGASTRODUODENOSCOPY (EGD) WITH PROPOFOL (N/A ) BIOPSY  Patient location during evaluation: PACU Anesthesia Type: General Level of consciousness: awake and alert Pain management: pain level controlled Vital Signs Assessment: post-procedure vital signs reviewed and stable Respiratory status: spontaneous breathing Cardiovascular status: blood pressure returned to baseline Postop Assessment: no apparent nausea or vomiting Anesthetic complications: no   No complications documented.   Last Vitals:  Vitals:   08/28/20 0806 08/28/20 1049  BP:  138/75  Pulse:  90  Resp:  (!) 22  Temp:  36.6 C  SpO2: 95% 100%    Last Pain:  Vitals:   08/28/20 1113  TempSrc:   PainSc: 0-No pain                 Karna Dupes

## 2020-08-28 NOTE — Progress Notes (Signed)
UNMATCHED BLOOD PRODUCT NOTE  Compare the patient ID on the blood tag to the patient ID on the hospital armband and Blood Bank armband. Then confirm the unit number on the blood tag matches the unit number on the blood product.  If a discrepancy is discovered return the product to blood bank immediately.   Blood Product Type: Packed Red Blood Cells   Unit #: [id number]  Product Code #: [id number]  Start Time: 0045  Starting Rate: 120 ml/hr  Rate increase/decreased  (if applicable):      ml/hr  Rate changed time (if applicable):    Stop Time: [place] BLOOD WITH SECOND RN, Baldemar Friday All Other Documentation should be documented within the Blood Admin Flowsheet per policy.

## 2020-08-28 NOTE — Transfer of Care (Signed)
Immediate Anesthesia Transfer of Care Note  Patient: Hannah Mckee  Procedure(s) Performed: ESOPHAGOGASTRODUODENOSCOPY (EGD) WITH PROPOFOL (N/A ) BIOPSY  Patient Location: PACU  Anesthesia Type:General  Level of Consciousness: awake and alert   Airway & Oxygen Therapy: Patient Spontanous Breathing  Post-op Assessment: Report given to RN  Post vital signs: Reviewed and stable  Last Vitals:  Vitals Value Taken Time  BP    Temp    Pulse    Resp    SpO2      Last Pain:  Vitals:   08/28/20 1113  TempSrc:   PainSc: 0-No pain         Complications: No complications documented.

## 2020-08-28 NOTE — Consult Note (Addendum)
Referring Provider: Mariel Aloe, MD Primary Care Physician:  Celene Squibb, MD Primary Gastroenterologist:  Dr. Hildred Laser  Reason for Consultation:  Anemia, melena  HPI: Hannah Mckee is a 86 y.o. female with past medical history significant for CHF, CAD, COPD on nighttime oxygen, hypertension who presented to the emergency department because of worsening shortness of breath and melena.  Patient has had progressive shortness of breath for several weeks but yesterday much worse. Noted to have a low blood pressure at home.  EMS was called who checked things out advised her to see her doctor.  Later that day she started having abdominal pain and passed a large black stool. She passed black stool twice prior to presenting to the ED.  In the emergency department stool was heme positive.  Hemoglobin noted to be 8.5.  Most recent hemoglobin on file from 2019 was 12.  Blood pressure 108/49.  Potassium 2.8.  BUN 28, creatinine 0.7.  INR 1.1.  Magnesium 1.9.  Hemoglobin 8.0 today.  Transfusion of 1 unit of packed red blood cells overnight per patient. White blood cell count down from 13,300-10,900.  Potassium 3.9 today. Vital signs stable.  Currently on pantoprazole infusion.  Patient denies any typical reflux symptoms. She states that her pulmonologist, Dr. Melvyn Novas, has mentioned possibility of her having reflux. She denies dysphagia. She does have episodes of significant amount of mucousy type material that she pulls from her mouth. Currently no abdominal pain. Did have abdominal pain prior to melena but this resolved. Denies any rectal bleeding. Chronically constipated. She takes aspirin 81 mg daily. She takes ibuprofen 200 mg about three times per week. At baseline she is able to move around her house without any significant shortness of breath prior to the most recent week. Chronically coughs but nonproductive.  Echocardiogram and November 2019 with estimated ejection fraction of 55 to 60%, aortic  valve with mild regurgitation.  Mitral valve mild regurgitation.  Colonoscopy in 2014: Moderate diverticula in the sigmoid colon.  Ulceration and exudate involving 1 large diverticulum of the sigmoid colon consistent with diverticulitis.  Few scattered erosions at the sigmoid and descending colon.   Prior to Admission medications   Medication Sig Start Date End Date Taking? Authorizing Provider  acetaminophen (TYLENOL) 500 MG tablet Take 1,000 mg by mouth every 6 (six) hours as needed for moderate pain.   Yes [provider]  amLODipine (NORVASC) 5 MG tablet TAKE 1 TABLET BY MOUTH DAILY Patient taking differently: Take 5 mg by mouth daily.  01/11/20  Yes BranchAlphonse Guild, MD  aspirin EC 81 MG tablet Take 81 mg by mouth daily. Swallow whole.   Yes [provider]  Cholecalciferol (VITAMIN D-3) 125 MCG (5000 UT) TABS Take 1 capsule by mouth daily as needed. Takes 3 or 4 times a week   Yes [provider]  feeding supplement, ENSURE ENLIVE, (ENSURE ENLIVE) LIQD Take 237 mLs by mouth 2 (two) times daily between meals. Patient taking differently: Take 118.5 mLs by mouth daily.  10/11/16  Yes Dhungel, Nishant, MD  furosemide (LASIX) 40 MG tablet Take 1.5 tablets (60 mg total) by mouth daily. (May take an extra 1/2 tab as needed for severe swelling.) Patient taking differently: Take 40 mg by mouth daily.  01/08/20  Yes Branch, Alphonse Guild, MD  ipratropium-albuterol (DUONEB) 0.5-2.5 (3) MG/3ML SOLN Take 3 mLs by nebulization every 4 (four) hours as needed.   Yes [provider]  metoprolol succinate (TOPROL-XL) 50 MG 24  hr tablet Take 1 tablet (50 mg total) by mouth in the morning and at bedtime. One half twice daily 06/20/20  Yes Branch, Alphonse Guild, MD  nitroGLYCERIN (NITROSTAT) 0.4 MG SL tablet Place 1 tablet (0.4 mg total) under the tongue every 5 (five) minutes as needed for chest pain (CP or SOB). 01/08/20  Yes Branch, Alphonse Guild, MD  potassium chloride SA (KLOR-CON) 20  MEQ tablet Take 1 tablet (20 mEq total) by mouth daily. Takes only when Furosemide is taken Patient taking differently: Take 20 mEq by mouth daily.  01/25/20  Yes Branch, Alphonse Guild, MD  umeclidinium-vilanterol (ANORO ELLIPTA) 62.5-25 MCG/INH AEPB Inhale 1 puff into the lungs daily.   Yes [provider]  Ibuprofen 200mg  three times per week.  Current Facility-Administered Medications  Medication Dose Route Frequency Provider Last Rate Last Admin  . 0.9 %  sodium chloride infusion (Manually program via Guardrails IV Fluids)   Intravenous Once Elgergawy, Silver Huguenin, MD      . 0.9 %  sodium chloride infusion   Intravenous Continuous Elgergawy, Silver Huguenin, MD 125 mL/hr at 08/28/20 0300 New Bag at 08/28/20 0300  . acetaminophen (TYLENOL) tablet 650 mg  650 mg Oral Q6H PRN Elgergawy, Silver Huguenin, MD       Or  . acetaminophen (TYLENOL) suppository 650 mg  650 mg Rectal Q6H PRN Elgergawy, Silver Huguenin, MD      . ipratropium-albuterol (DUONEB) 0.5-2.5 (3) MG/3ML nebulizer solution 3 mL  3 mL Nebulization Q4H PRN Elgergawy, Silver Huguenin, MD      . metoprolol succinate (TOPROL-XL) 24 hr tablet 25 mg  25 mg Oral BID Elgergawy, Silver Huguenin, MD      . pantoprazole (PROTONIX) 80 mg in sodium chloride 0.9 % 100 mL (0.8 mg/mL) infusion  8 mg/hr Intravenous Continuous Elgergawy, Silver Huguenin, MD 10 mL/hr at 08/27/20 1713 8 mg/hr at 08/27/20 1713  . umeclidinium-vilanterol (ANORO ELLIPTA) 62.5-25 MCG/INH 1 puff  1 puff Inhalation Daily Elgergawy, Silver Huguenin, MD        Allergies as of 08/27/2020 - Review Complete 08/27/2020  Allergen Reaction Noted  . Lisinopril Hives 03/20/2020  . Erythromycin Swelling 11/06/2012  . Levaquin [levofloxacin] Other (See Comments) 10/28/2014  . Penicillins Swelling 11/06/2012  . Sulfa antibiotics Other (See Comments) 11/06/2012    Past Medical History:  Diagnosis Date  . Acute blood loss anemia 08/27/2020  . Cancer (HCC)    Skin, basal  . CHF (congestive heart failure) ([place])   .  Complication of anesthesia 1950's   'Years ago difficult awaking up"  . COPD (chronic obstructive pulmonary disease) ([place])   . Coronary artery calcification seen on CT scan    Multivessel  . Dyspnea    short of breath walking in home, breathing normal after sitting down  . History of blood transfusion   . History of cardiomyopathy    a. EF 35-40% by echo in 10/2016 b. improved to 55-60% by echo in 04/2017  . History of kidney stones   . History of shingles   . Hypertension   . Kyphosis    marked severity  . Osteoarthritis   . Pneumonia 10/04/2016  . Salivary gland stone   . Sepsis [facility])     Past Surgical History:  Procedure Laterality Date  . BLADDER SURGERY    . BUNIONECTOMY    . CATARACT EXTRACTION Bilateral   . CHOLECYSTECTOMY    . COLONOSCOPY  11/16/2012   Procedure: COLONOSCOPY;  Surgeon: Rogene Houston, MD;  [facility];  Service: Endoscopy;  Laterality: N/A;  100  . LITHOTRIPSY    . MASS EXCISION N/A 03/21/2020   Procedure: EXCISION ORAL MASS;  Surgeon: Leta Baptist, MD;  Location: [place];  Service: ENT;  Laterality: N/A;  . ORIF PERIPROSTHETIC FRACTURE Right 10/29/2014   Procedure: OPEN REDUCTION INTERNAL FIXATION (ORIF) PERIPROSTHETIC FEMUR FRACTURE/FEMUR SHAFT;  Surgeon: Mcarthur Rossetti, MD;  Location: [place];  Service: Orthopedics;  Laterality: Right;  . TONSILLECTOMY    . TOTAL HIP ARTHROPLASTY     1989    Family History  Problem Relation Age of Onset  . Hypertension Sister     Social History   Socioeconomic History  . Marital status: Divorced    Spouse name: Not on file  . Number of children: Not on file  . Years of education: Not on file  . Highest education level: Not on file  Occupational History  . Not on file  Tobacco Use  . Smoking status: Former Smoker    Packs/day: 0.50    Years: 35.00    Pack years: 17.50    Types: Cigarettes    Quit date: 01/1997    Years since quitting: 23.5  . Smokeless tobacco: Never Used  Vaping Use   . Vaping Use: Never used  Substance and Sexual Activity  . Alcohol use: No  . Drug use: No  . Sexual activity: Not Currently  Other Topics Concern  . Not on file  Social History Narrative  . Not on file   Social Determinants of Health   Financial Resource Strain:   . Difficulty of Paying Living Expenses: Not on file  Food Insecurity:   . Worried About Charity fundraiser in the Last Year: Not on file  . Ran Out of Food in the Last Year: Not on file  Transportation Needs:   . Lack of Transportation (Medical): Not on file  . Lack of Transportation (Non-Medical): Not on file  Physical Activity:   . Days of Exercise per Week: Not on file  . Minutes of Exercise per Session: Not on file  Stress:   . Feeling of Stress : Not on file  Social Connections:   . Frequency of Communication with Friends and Family: Not on file  . Frequency of Social Gatherings with Friends and Family: Not on file  . Attends Religious Services: Not on file  . Active Member of Clubs or Organizations: Not on file  . Attends Archivist Meetings: Not on file  . Marital Status: Not on file  Intimate Partner Violence:   . Fear of Current or Ex-Partner: Not on file  . Emotionally Abused: Not on file  . Physically Abused: Not on file  . Sexually Abused: Not on file     ROS:  General: Negative for anorexia, weight loss, fever, chills, fatigue, weakness. Eyes: Negative for vision changes.  ENT: Negative for hoarseness, difficulty swallowing , nasal congestion. CV: Negative for chest pain, angina, palpitations, ++dyspnea on exertion, peripheral edema.  Respiratory: Negative for dyspnea at rest, ++dyspnea on exertion, ++cough. No sputum, wheezing.  GI: See history of present illness. GU:  Negative for dysuria, hematuria, urinary incontinence, urinary frequency, nocturnal urination.  MS: +joint pain. Significant kyphosis.  Derm: Negative for rash or itching.  Neuro: Negative for weakness, abnormal  sensation, seizure, frequent headaches, memory loss, confusion.  Psych: Negative for anxiety, depression, suicidal ideation, hallucinations.  Endo: Negative for unusual weight change.  Heme: Negative for bruising or  bleeding. Allergy: Negative for rash or hives.       Physical Examination: Vital signs in last 24 hours: Temp:  [98 F (36.7 C)-98.2 F (36.8 C)] 98.2 F (36.8 C) (10/28 0327) Pulse Rate:  [83-97] 90 (10/28 0327) Resp:  [16-28] 16 (10/28 0327) BP: (108-145)/(47-58) 134/58 (10/28 0327) SpO2:  [95 %-100 %] 97 % (10/28 0327) Weight:  [44.9 kg-46.4 kg] 46.4 kg (10/27 2116) Last BM Date: 08/27/20  General: Well-nourished, well-developed in no acute distress. Kyphosis noted. Daughter at bedside. Head: Normocephalic, atraumatic.   Eyes: Conjunctiva pink, no icterus. Mouth: Oropharyngeal mucosa moist and pink , no lesions erythema or exudate. Neck: Supple without thyromegaly, masses, or lymphadenopathy.  Lungs: Clear to auscultation bilaterally.  Heart: Regular rate and rhythm, no murmurs rubs or gallops.  Abdomen: Bowel sounds are normal, nontender, nondistended, no hepatosplenomegaly or masses, no abdominal bruits or    hernia , no rebound or guarding.   Rectal: not performed Extremities: No lower extremity edema, clubbing, deformity.  Neuro: Alert and oriented x 4 , grossly normal neurologically.  Skin: Warm and dry, no rash or jaundice.   Psych: Alert and cooperative, normal mood and affect.        Intake/Output from previous day: 10/27 0701 - 10/28 0700 In: 400 [IV Piggyback:400] Out: 400 [Urine:400] Intake/Output this shift: No intake/output data recorded.  Lab Results: CBC Recent Labs    08/27/20 1555 08/28/20 0547  WBC 13.3* 10.9*  HGB 8.5* 8.0*  HCT 26.3* 25.9*  MCV 92.3 89.9  PLT 323 241   BMET Recent Labs    08/27/20 1555 08/28/20 0547  NA 135 139  K 2.8* 3.9  CL 94* 108  CO2 30 25  GLUCOSE 110* 100*  BUN 28* 22  CREATININE 0.70 0.63   CALCIUM 9.1 8.3*   LFT Recent Labs    08/27/20 1555  BILITOT 0.5  ALKPHOS 74  AST 16  ALT 11  PROT 6.8  ALBUMIN 3.3*    Lipase No results for input(s): LIPASE in the last 72 hours.  PT/INR Recent Labs    08/27/20 1555  LABPROT 13.3  INR 1.1      Imaging Studies: DG Chest 2 View  Result Date: 08/27/2020 CLINICAL DATA:  Pt sent over from doctors office for a gi bleed, pt states that she is having black tarry stools. Hx of HTN, COPD, CHF, and pneumonia in 2017. Former smoker. Covid test negative on 5/18. EXAM: CHEST - 2 VIEW COMPARISON:  03/28/2020 FINDINGS: Lungs are hyperinflated with attenuated bronchovascular markings as before. No focal infiltrate or overt edema. Heart size and mediastinal contours are within normal limits. Aortic Atherosclerosis (ICD10-170.0). No effusion.  No pneumothorax. Osteopenia with thoracic kyphosis. Right shoulder DJD. Cholecystectomy clips. IMPRESSION: Hyperinflation. No acute disease. Electronically Signed   By: Lucrezia Europe M.D.   On: 08/27/2020 16:35  [4 week]   Impression: Pleasant 87 year old female with past medical history significant for COPD on nighttime oxygen, CHF, hypertension, kyphosis presenting for shortness of breath in the setting of melena. She had two episodes of melena prior to presentation. No bowel movement since admission. She has received 1 unit of packed red blood cells and noted to have a decline in hemoglobin overnight from 8.5 down to eight. Her breathing has improved. Her blood pressure has stabilized.  I suspect upper GI bleed possibly due to peptic ulcer disease in the setting of aspirin and ibuprofen use.  Plan: 1. Plan for upper endoscopy today with Dr. Gala Romney. ASA  III.  I have discussed the risks, alternatives, benefits with regards to but not limited to the risk of reaction to medication, bleeding, infection, perforation and the patient is agreeable to proceed. Written consent to be obtained. PATIENT HAS KYPHOSIS  AND REQUESTS CONSIDERATION WITH POSITIONING FOR PROCEDURE. DR Gala Romney IS AWARE. 2. Monitor for further bleeding, transfuse as needed. 3. Continue Pantoprazole infusion for now.  We would like to thank you for the opportunity to participate in the care of Hannah Mckee.  Laureen Ochs. Bernarda Caffey [facility] [phone] 10/28/20219:54 AM     LOS: 1 day

## 2020-08-28 NOTE — Anesthesia Preprocedure Evaluation (Signed)
Anesthesia Evaluation  Patient identified by MRN, date of birth, ID band  Reviewed: Allergy & Precautions, NPO status , Patient's Chart, lab work & pertinent test results, reviewed documented beta blocker date and time   History of Anesthesia Complications Negative for: history of anesthetic complications  Airway Mallampati: II  TM Distance: >3 FB Neck ROM: Full    Dental no notable dental hx.    Pulmonary COPD, former smoker,    Pulmonary exam normal breath sounds clear to auscultation       Cardiovascular hypertension, + CAD (EF 55% on last echo) and +CHF (chronic. currently asymptomatic)  Normal cardiovascular exam Rhythm:Regular Rate:Normal     Neuro/Psych    GI/Hepatic   Endo/Other    Renal/GU      Musculoskeletal  (+) Arthritis , Osteoarthritis,    Abdominal   Peds  Hematology  (+) anemia ,   Anesthesia Other Findings   Reproductive/Obstetrics                             Anesthesia Physical Anesthesia Plan  ASA: III  Anesthesia Plan: General   Post-op Pain Management:    Induction: Intravenous  PONV Risk Score and Plan:   Airway Management Planned: Nasal Cannula  Additional Equipment:   Intra-op Plan:   Post-operative Plan:   Informed Consent: I have reviewed the patients History and Physical, chart, labs and discussed the procedure including the risks, benefits and alternatives for the proposed anesthesia with the patient or authorized representative who has indicated his/her understanding and acceptance.     Dental advisory given  Plan Discussed with: CRNA  Anesthesia Plan Comments:         Anesthesia Quick Evaluation

## 2020-08-28 NOTE — Op Note (Addendum)
[facility] Patient Name: Hannah Mckee Procedure Date: 08/28/2020 11:09 AM MRN: [id number] Date of Birth: 08/16/1931 Attending MD: Norvel Richards , MD CSN: [id number] Age: 86 Admit Type: Inpatient Procedure:                Upper GI endoscopy Indications:              Melena Providers:                Norvel Richards, MD, Lambert Mody,                            Crystal Page, [place] Risa Grill, Technician,                            Nelma Rothman, Merchant navy officer Referring MD:             Edwinna Areola. Hall MD Medicines:                Propofol per Anesthesia Complications:            No immediate complications. Estimated Blood Loss:     Estimated blood loss was minimal. Procedure:                Pre-Anesthesia Assessment:                           - Prior to the procedure, a History and Physical                            was performed, and patient medications and                            allergies were reviewed. The patient's tolerance of                            previous anesthesia was also reviewed. The risks                            and benefits of the procedure and the sedation                            options and risks were discussed with the patient.                            All questions were answered, and informed consent                            was obtained. Prior Anticoagulants: The patient has                            taken no previous anticoagulant or antiplatelet                            agents. ASA Grade Assessment: III - A patient with  severe systemic disease. After reviewing the risks                            and benefits, the patient was deemed in                            satisfactory condition to undergo the procedure.                           After obtaining informed consent, the endoscope was                            passed under direct vision. Throughout the                            procedure, the  patient's blood pressure, pulse, and                            oxygen saturations were monitored continuously. The                            [id number]-H190 ([id number]) scope was introduced through the                            mouth, and advanced to the third part of duodenum.                            The upper GI endoscopy was accomplished without                            difficulty. The patient tolerated the procedure                            well. Scope In: 11:19:02 AM Scope Out: 11:30:27 AM Total Procedure Duration: 0 hours 11 minutes 25 seconds  Findings:      A mild Schatzki ring was found at the gastroesophageal junction.       Nonobstructing. Esophagus otherwise appeared normal.      A small hiatal hernia was present. Otherwise, stomach appeared normal.      Patient had 2 diaphragm, like webs in tandem - junction of D1 and D2       initially, held off passage of the scope. With some gentle pressure,       scope passed with mild traumatic dilation. Patient found to have a large       second portion diverticulum. In the proximal portion of the second       portion, there was a 5 mm ulcer with flat pigment overlying the crater.       No high risk stigmata. Couple of erosions also present.      Biopsies of the gastric mucosa taken to assess for H. pylori Impression:               - Mild Schatzki ring. Not manipulated/no dysphagia.                           -  Small hiatal hernia.                           - Small duodenal ulcer with associated erosions. No                            high risk stigmata. Weblike duodenal diaphragms -                            traumatically dilated - typical finding of NSAID                            use. Duodenal diverticulum. Duodenal erosions.                            Status post gastric biopsy Moderate Sedation:      Moderate (conscious) sedation was personally administered by an       anesthesia professional. The following parameters were  monitored: oxygen       saturation, heart rate, blood pressure, respiratory rate, EKG, adequacy       of pulmonary ventilation, and response to care. Recommendation:           - Patient has a contact number available for                            emergencies. The signs and symptoms of potential                            delayed complications were discussed with the                            patient. Return to normal activities tomorrow.                            Written discharge instructions were provided to the                            patient.                           - Advance diet as tolerated. Hold aspirin and                            ibuprofen. May resume 1 baby aspirin in 7 days if                            clinical benefits outweigh the risks. Protonix 40                            mg daily - indefinitely. Follow-up on pathology. No                            further GI evaluation at this time. Office visit  with [place] in 12 weeks.                           -Would keep in the hospital another 24 hours to                            assure stability given her advanced age and                            comorbidities. Had a lengthy discussion with her                            daughter, Baker Janus, at [phone] regarding                            impression and recommendations.                           -I will follow up on biopsies. Procedure Code(s):        --- Professional ---                           [phone], Esophagogastroduodenoscopy, flexible,                            transoral; diagnostic, including collection of                            specimen(s) by brushing or washing, when performed                            (separate procedure) Diagnosis Code(s):        --- Professional ---                           K22.2, Esophageal obstruction                           K44.9, Diaphragmatic hernia without obstruction or                             gangrene                           K92.1, Melena (includes Hematochezia) CPT copyright 2019 American Medical Association. All rights reserved. The codes documented in this report are preliminary and upon coder review may  be revised to meet current compliance requirements. Cristopher Estimable. Jaidyn Kuhl, MD Norvel Richards, MD 08/28/2020 12:12:42 PM This report has been signed electronically. Number of Addenda: 0

## 2020-08-28 NOTE — Progress Notes (Signed)
PROGRESS NOTE    KALAYA INFANTINO  DXI:[id number] DOB: 1931/10/13 DOA: 08/27/2020 PCP: Celene Squibb, MD   Brief Narrative: Hannah Mckee is a 86 y.o. female,with a history of systolic CHF, COPD, chronic respiratory failure on 1.5 L at nighttime, hypertension, sialolithiasis. Patient presented secondary to melenotic stool and abdominal pain with associated blood loss anemia and FOBT positive. GI consulted for EGD. 1 unit of PRBC transfused.   Assessment & Plan:   Active Problems:   Hypertension   COPD (chronic obstructive pulmonary disease) (HCC)   Hypokalemia   Chronic diastolic CHF (congestive heart failure) (HCC)   Acute blood loss anemia   Melena Unknown etiology. Likely upper GI. No prior history of ulcers. No frequent NSAID use per patient/daughter. GI consulted and plan for upper endoscopy today -Gastroenterology recommendations:   Nocturnal hypoxia -Oxygen via [place] @ 2 Lpm qhs -Recommend outpatient sleep study vs discussion with PCP/Pulmonologist for possible CPAP  COPD Per patient/daughter, possibly not an accurate diagnosis per new pulmonologist. -Continue Duoneb, Anoro ellipta   Chronic diastolic heart failure Stable. Euvolemic.  Essential hypertension -Continue metoprolol succinate  Acute blood loss anemia Secondary to blood loss from melena. Transfused 1 unit of PRBC. Post-transfusion hemoglobin stable.   DVT prophylaxis: SCDs Code Status:   Code Status: Full Code Family Communication: Daughter at bedside Disposition Plan: Discharge home likely in 1-2 days pending GI recommendations/management   Consultants:   Gastroenterology  Procedures:   PRBC (1 unit)  Antimicrobials:  None    Subjective: No melena/bowel movements today.  Objective: Vitals:   08/28/20 0028 08/28/20 0100 08/28/20 0327 08/28/20 0806  BP: (!) 126/49 (!) 135/52 (!) 134/58   Pulse: 83 90 90   Resp: 16 16 16    Temp: 98 F (36.7 C) 98.2 F (36.8 C) 98.2 F (36.8 C)    TempSrc: Oral Oral Oral   SpO2: 99% 99% 97% 95%  Weight:      Height:        Intake/Output Summary (Last 24 hours) at 08/28/2020 0919 Last data filed at 08/28/2020 0600 Gross per 24 hour  Intake 400 ml  Output 400 ml  Net 0 ml   Filed Weights   08/27/20 1523 08/27/20 2116  Weight: 44.9 kg 46.4 kg    Examination:  General exam: Appears calm and comfortable Respiratory system: Clear to auscultation. Respiratory effort normal. Cardiovascular system: S1 & S2 heard, RRR. No murmurs, rubs, gallops or clicks. Gastrointestinal system: Abdomen is nondistended, soft and nontender. No organomegaly or masses felt. Normal bowel sounds heard. Central nervous system: Alert and oriented. No focal neurological deficits. Musculoskeletal: No edema. No calf tenderness Skin: No cyanosis. No rashes Psychiatry: Judgement and insight appear normal. Mood & affect appropriate.     Data Reviewed: I have personally reviewed following labs and imaging studies  CBC Lab Results  Component Value Date   WBC 10.9 (H) 08/28/2020   RBC 2.88 (L) 08/28/2020   HGB 8.0 (L) 08/28/2020   HCT 25.9 (L) 08/28/2020   MCV 89.9 08/28/2020   MCH 27.8 08/28/2020   PLT 241 08/28/2020   MCHC 30.9 08/28/2020   RDW 17.1 (H) 08/28/2020   LYMPHSABS 1.4 08/27/2020   MONOABS 0.9 08/27/2020   EOSABS 0.2 08/27/2020   BASOSABS 0.1 76/73/4193     Last metabolic panel Lab Results  Component Value Date   NA 139 08/28/2020   K 3.9 08/28/2020   CL 108 08/28/2020   CO2 25 08/28/2020   BUN 22 08/28/2020  CREATININE 0.63 08/28/2020   GLUCOSE 100 (H) 08/28/2020   GFRNONAA >60 08/28/2020   GFRAA >60 09/07/2018   CALCIUM 8.3 (L) 08/28/2020   PHOS 3.5 09/06/2018   PROT 6.8 08/27/2020   ALBUMIN 3.3 (L) 08/27/2020   BILITOT 0.5 08/27/2020   ALKPHOS 74 08/27/2020   AST 16 08/27/2020   ALT 11 08/27/2020   ANIONGAP 6 08/28/2020    CBG (last 3)  Recent Labs    08/28/20 0858  GLUCAP 93     GFR: Estimated  Creatinine Clearance: 30.5 mL/min (by C-G formula based on SCr of 0.63 mg/dL).  Coagulation Profile: Recent Labs  Lab 08/27/20 1555  INR 1.1    Recent Results (from the past 240 hour(s))  Respiratory Panel by RT PCR (Flu A&B, Covid) - Nasopharyngeal Swab     Status: None   Collection Time: 08/27/20  3:44 PM   Specimen: Nasopharyngeal Swab  Result Value Ref Range Status   SARS Coronavirus 2 by RT PCR NEGATIVE NEGATIVE Final    Comment: (NOTE) SARS-CoV-2 target nucleic acids are NOT DETECTED.  The SARS-CoV-2 RNA is generally detectable in upper respiratoy specimens during the acute phase of infection. The lowest concentration of SARS-CoV-2 viral copies this assay can detect is 131 copies/mL. A negative result does not preclude SARS-Cov-2 infection and should not be used as the sole basis for treatment or other patient management decisions. A negative result may occur with  improper specimen collection/handling, submission of specimen other than nasopharyngeal swab, presence of viral mutation(s) within the areas targeted by this assay, and inadequate number of viral copies (<131 copies/mL). A negative result must be combined with clinical observations, patient history, and epidemiological information. The expected result is Negative.  Fact Sheet for Patients:  PinkCheek.be  Fact Sheet for Healthcare Providers:  GravelBags.it  This test is no t yet approved or cleared by the [place] FDA and  has been authorized for detection and/or diagnosis of SARS-CoV-2 by FDA under an Emergency Use Authorization (EUA). This EUA will remain  in effect (meaning this test can be used) for the duration of the COVID-19 declaration under Section 564(b)(1) of the Act, 21 U.S.C. section 360bbb-3(b)(1), unless the authorization is terminated or revoked sooner.     Influenza A by PCR NEGATIVE NEGATIVE Final   Influenza B by PCR  NEGATIVE NEGATIVE Final    Comment: (NOTE) The Xpert Xpress SARS-CoV-2/FLU/RSV assay is intended as an aid in  the diagnosis of influenza from Nasopharyngeal swab specimens and  should not be used as a sole basis for treatment. Nasal washings and  aspirates are unacceptable for Xpert Xpress SARS-CoV-2/FLU/RSV  testing.  Fact Sheet for Patients: PinkCheek.be  Fact Sheet for Healthcare Providers: GravelBags.it  This test is not yet approved or cleared by the [place] FDA and  has been authorized for detection and/or diagnosis of SARS-CoV-2 by  FDA under an Emergency Use Authorization (EUA). This EUA will remain  in effect (meaning this test can be used) for the duration of the  Covid-19 declaration under Section 564(b)(1) of the Act, 21  U.S.C. section 360bbb-3(b)(1), unless the authorization is  terminated or revoked. Performed at [facility], [address], [place], [place] [address]         Radiology Studies: DG Chest 2 View  Result Date: 08/27/2020 CLINICAL DATA:  Pt sent over from doctors office for a gi bleed, pt states that she is having black tarry stools. Hx of HTN, COPD, CHF, and pneumonia in 2017.  Former smoker. Covid test negative on 5/18. EXAM: CHEST - 2 VIEW COMPARISON:  03/28/2020 FINDINGS: Lungs are hyperinflated with attenuated bronchovascular markings as before. No focal infiltrate or overt edema. Heart size and mediastinal contours are within normal limits. Aortic Atherosclerosis (ICD10-170.0). No effusion.  No pneumothorax. Osteopenia with thoracic kyphosis. Right shoulder DJD. Cholecystectomy clips. IMPRESSION: Hyperinflation. No acute disease. Electronically Signed   By: Lucrezia Europe M.D.   On: 08/27/2020 16:35        Scheduled Meds: . sodium chloride   Intravenous Once  . metoprolol succinate  25 mg Oral BID  . umeclidinium-vilanterol  1 puff Inhalation Daily   Continuous Infusions: . sodium  chloride 125 mL/hr at 08/28/20 0300  . pantoprozole (PROTONIX) infusion 8 mg/hr (08/27/20 1713)     LOS: 1 day     Cordelia Poche, MD [facility] 08/28/2020, 9:19 AM  If 7PM-7AM, please contact night-coverage [email or web address]

## 2020-08-29 ENCOUNTER — Other Ambulatory Visit: Payer: Self-pay

## 2020-08-29 ENCOUNTER — Encounter: Payer: Self-pay | Admitting: Gastroenterology

## 2020-08-29 ENCOUNTER — Telehealth: Payer: Self-pay | Admitting: Nurse Practitioner

## 2020-08-29 ENCOUNTER — Encounter: Payer: Self-pay | Admitting: Internal Medicine

## 2020-08-29 ENCOUNTER — Telehealth: Payer: Self-pay | Admitting: Gastroenterology

## 2020-08-29 DIAGNOSIS — I5032 Chronic diastolic (congestive) heart failure: Secondary | ICD-10-CM

## 2020-08-29 DIAGNOSIS — K922 Gastrointestinal hemorrhage, unspecified: Secondary | ICD-10-CM | POA: Diagnosis not present

## 2020-08-29 DIAGNOSIS — J438 Other emphysema: Secondary | ICD-10-CM | POA: Diagnosis not present

## 2020-08-29 DIAGNOSIS — D62 Acute posthemorrhagic anemia: Secondary | ICD-10-CM | POA: Diagnosis not present

## 2020-08-29 LAB — CBC
HCT: 26.6 % — ABNORMAL LOW (ref 36.0–46.0)
Hemoglobin: 8.2 g/dL — ABNORMAL LOW (ref 12.0–15.0)
MCH: 28.4 pg (ref 26.0–34.0)
MCHC: 30.8 g/dL (ref 30.0–36.0)
MCV: 92 fL (ref 80.0–100.0)
Platelets: 256 10*3/uL (ref 150–400)
RBC: 2.89 MIL/uL — ABNORMAL LOW (ref 3.87–5.11)
RDW: 17.4 % — ABNORMAL HIGH (ref 11.5–15.5)
WBC: 9.5 10*3/uL (ref 4.0–10.5)
nRBC: 0 % (ref 0.0–0.2)

## 2020-08-29 LAB — SURGICAL PATHOLOGY

## 2020-08-29 LAB — BPAM RBC

## 2020-08-29 MED ORDER — PANTOPRAZOLE SODIUM 40 MG PO TBEC: 40.0000 mg | DELAYED_RELEASE_TABLET | Freq: Every day | ORAL | 0 refills | Status: AC

## 2020-08-29 NOTE — Telephone Encounter (Signed)
Patient needs hospital follow up in 12 weeks.

## 2020-08-29 NOTE — Progress Notes (Addendum)
Agree with the below findings as written. The patient was presented to me by the APP (Advanced Practice Provider) and I have also seen and examined the patient independently. Please see my key portion of the encounter as documented.  Patient's hemoglobin is stable at 8.2 today.  EGD with small duodenal ulcer and associated erosions consistent with chronic NSAID use.  She will need to avoid NSAIDs especially can going forward.  Continue on Protonix 40 mg daily indefinitely.  Follow-up in 12 weeks or sooner if needed.  GI to sign off, please call with any questions concerns  [name]. [name], D.O. Gastroenterology and Hepatology [facility]    Subjective: Patient feeling better today. Daughter at bedside. Remembers procedure results. Discussed results and recommendations. States she was told she can probably go home today. Denies abdominal pain, N/V, hematochezia, melena. Denies any other GI complaints.  Objective: Vital signs in last 24 hours: Temp:  [97.7 F (36.5 C)-98.1 F (36.7 C)] 98.1 F (36.7 C) (10/29 0543) Pulse Rate:  [78-84] 84 (10/29 0543) Resp:  [17-24] 20 (10/29 0543) BP: (94-143)/(36-53) 130/51 (10/29 0543) SpO2:  [95 %-99 %] 99 % (10/29 0836) Last BM Date: 08/27/20 General:   Alert and oriented, pleasant Head:  Normocephalic and atraumatic. Eyes:  No icterus, sclera clear. Conjuctiva pink.  Heart:  S1, S2 present, no murmurs noted.  Lungs: Clear to auscultation bilaterally, without wheezing, rales, or rhonchi.  Abdomen:  Bowel sounds present, soft, non-tender, non-distended. No HSM or hernias noted. No rebound or guarding. No masses appreciated  Msk:  Symmetrical without gross deformities. Pulses:  Normal bilateral DP pulses noted. Extremities:  Without clubbing or edema. Neurologic:  Alert and  oriented x4;  grossly normal neurologically. Psych:  Alert and cooperative. Normal mood and affect.  Intake/Output from previous day: 10/28 0701  - 10/29 0700 In: 2551.1 [I.V.:2551.1] Out: 0  Intake/Output this shift: Total I/O In: 240 [P.O.:240] Out: -   Lab Results: Recent Labs    08/27/20 1555 08/27/20 1555 08/28/20 0547 08/28/20 0945 08/29/20 0822  WBC 13.3*  --  10.9*  --  9.5  HGB 8.5*   < > 8.0* 8.9* 8.2*  HCT 26.3*   < > 25.9* 29.0* 26.6*  PLT 323  --  241  --  256   < > = values in this interval not displayed.   BMET Recent Labs    08/27/20 1555 08/28/20 0547  NA 135 139  K 2.8* 3.9  CL 94* 108  CO2 30 25  GLUCOSE 110* 100*  BUN 28* 22  CREATININE 0.70 0.63  CALCIUM 9.1 8.3*   LFT Recent Labs    08/27/20 1555  PROT 6.8  ALBUMIN 3.3*  AST 16  ALT 11  ALKPHOS 74  BILITOT 0.5   PT/INR Recent Labs    08/27/20 1555  LABPROT 13.3  INR 1.1   Hepatitis Panel No results for input(s): HEPBSAG, HCVAB, HEPAIGM, HEPBIGM in the last 72 hours.   Studies/Results: DG Chest 2 View  Result Date: 08/27/2020 CLINICAL DATA:  Pt sent over from doctors office for a gi bleed, pt states that she is having black tarry stools. Hx of HTN, COPD, CHF, and pneumonia in 2017. Former smoker. Covid test negative on 5/18. EXAM: CHEST - 2 VIEW COMPARISON:  03/28/2020 FINDINGS: Lungs are hyperinflated with attenuated bronchovascular markings as before. No focal infiltrate or overt edema. Heart size and mediastinal contours are within normal limits. Aortic Atherosclerosis (ICD10-170.0). No effusion.  No pneumothorax. Osteopenia  with thoracic kyphosis. Right shoulder DJD. Cholecystectomy clips. IMPRESSION: Hyperinflation. No acute disease. Electronically Signed   By: Lucrezia Europe M.D.   On: 08/27/2020 16:35    Assessment: Pleasant 86 year old female with past medical history significant for COPD on nighttime oxygen, CHF, hypertension, kyphosis presenting for shortness of breath in the setting of melena. She had two episodes of melena prior to presentation. No bowel movement since admission. She has received 1 unit of packed  red blood cells and noted to have a decline in hemoglobin overnight from 8.5 down to eight yesterday morning.   Underwent EGD yesterday that found mild Schatzki's ring not manipulated (no dysphagia complaints), small hiatal hernia, small duodenal ulcer with associated erosions without stigmata and weblike duodenal diaphragms typical of NSAID use, duodenal diverticulum, status post gastric biopsy.  Recommended may resume aspirin 81 mg in 7 days if benefits outweigh the risks, avoid all other NSAIDs, Protonix 40 mg daily indefinitely, follow-up 12 weeks after discharge.   Her hgb is stable today at 8.2. Her breathing has improved. Her blood pressure has stabilized. Discussed strict NSAID avoidance.  Plan: Strict NSAID avoidance Restart ASA 81 mg IF benefits > risks (per cardiology assessment); Message sent to Dr. Harl Bowie for opinion Protonix 40 mg daily indefinitely (discussed with patient and daughter) Supportive measures Follow-up in 12 weeks Anticipate d/c home today We will sign off for now,. Please let us know if any further assistance is needed   Thank you for allowing [place] to participate in the care of [place], DNP, AGNP-C Adult & Gerontological Nurse Practitioner [facility]     LOS: 2 days    08/29/2020, 11:09 AM

## 2020-08-29 NOTE — Telephone Encounter (Signed)
Patient scheduled.

## 2020-08-29 NOTE — Telephone Encounter (Signed)
Please schedule 12 week hospital follow-up. Patient has previously seen NUR/Terri Setzer. Anticipate d/c home today (possibly tomorrow).

## 2020-08-29 NOTE — Discharge Instructions (Signed)
Hannah Mckee,  You were in the hospital because of melena (black, tarry stools). This appears to be related to GI bleeding and you had an endoscopy which did not locate an active bleed. Your blood count is stable after one unit of blood and the GI is okay with you discharging home. Your aspirin will be discontinued and you can follow-up with your cardiologist about this in the future. Please stop taking all NSAIDs (ibuprofen, Aleve, naproxen, Advil, etc)

## 2020-08-29 NOTE — Discharge Summary (Signed)
Physician Discharge Summary  Hannah Mckee WUX:[id number] DOB: 26-Mar-1931 DOA: 08/27/2020  PCP: Celene Squibb, MD  Admit date: 08/27/2020 Discharge date: 08/29/2020  Admitted From: Home Disposition: Home  Recommendations for Outpatient Follow-up:  1. Follow up with PCP in 1 week 2. Follow up with GI and cardiology 3. Please obtain BMP/CBC in one week 4. Please follow up on the following pending results: EGD biopsy  Home Health: None Equipment/Devices: None  Discharge Condition: Stable CODE STATUS: Full code Diet recommendation: Heart healthy   Brief/Interim Summary:  Admission HPI written by Albertine Patricia, MD    HPI:   Hannah Mckee  is a 86 y.o. female,with a history of systolic CHF, COPD, chronic respiratory failure on 1.5 L at nighttime, hypertension, sialolithiasis , patient presents to ED secondary to shortness of breath and black stools, patient with dyspnea for few weeks currently, but her dyspnea has much worsened today, as well she does report her blood pressure was on the lower side at home, she called EMS earlier today, her vitals were stable, she was supposed to follow with PCP as an outpatient, few hours later, she does report an episode of abdominal pain, as well she was noted to have large episode of black tarry stool this afternoon, and she was Hemoccult positive in ED, patient reports she is on aspirin, she denies any NSAIDs use, she is on aspirin daily, she denies any coffee-ground emesis. -In ED her hemoglobin was noted to be 8.5, most recent was in 2019 at 12, he was Hemoccult positive, blood pressure was soft at 108/49, she had hypokalemia with potassium of 2.8, ED discussed with GI Dr. Melony Overly, who recommended admission for endoscopy.[facility] was consulted to admit.   Hospital course:  Melena Unknown etiology. Likely upper GI. No prior history of ulcers. No frequent NSAID use per patient/daughter. GI consulted and patient underwent EGD which was not  significant for active bleeding. Ulceration noted with sequela consistent with NSAID use. Biopsy obtained. Recommendation for holding aspirin, Protonix indefinitely and outpatient follow-up.  Nocturnal hypoxia Continue oxygen as previously prescribed. Recommend outpatient sleep study vs discussion with PCP/Pulmonologist for possible CPAP use as an outpatient.  COPD Per patient/daughter, possibly not an accurate diagnosis per new pulmonologist. Continue Duoneb, Anoro ellipta   Chronic diastolic heart failure Stable. Euvolemic.  Essential hypertension Continue metoprolol succinate  Acute blood loss anemia Secondary to blood loss from melena. Transfused 1 unit of PRBC. Post-transfusion hemoglobin stable. No recurrent melena or new hematochezia. Recommend outpatient CBC.  Discharge Diagnoses:  Active Problems:   Hypertension   COPD (chronic obstructive pulmonary disease) (HCC)   Hypokalemia   Chronic diastolic CHF (congestive heart failure) (HCC)   Acute blood loss anemia   GI bleed   Symptomatic anemia    Discharge Instructions  Discharge Instructions    Diet - low sodium heart healthy   Complete by: As directed    Increase activity slowly   Complete by: As directed      Allergies as of 08/29/2020      Reactions   Lisinopril Hives   Erythromycin Swelling   Levaquin [levofloxacin] Other (See Comments)   Tongue turns red and sore   Penicillins Swelling   Has patient had a PCN reaction causing immediate rash, facial/tongue/throat swelling, SOB or lightheadedness with hypotension: Yes Has patient had a PCN reaction causing severe rash involving mucus membranes or skin necrosis: No Has patient had a PCN reaction that required hospitalization No Has patient had a  PCN reaction occurring within the last 10 years: No If all of the above answers are "NO", then may proceed with Cephalosporin use.   Sulfa Antibiotics Other (See Comments)   Tongue turns red and sore       Medication List    STOP taking these medications   aspirin EC 81 MG tablet     TAKE these medications   acetaminophen 500 MG tablet Commonly known as: TYLENOL Take 1,000 mg by mouth every 6 (six) hours as needed for moderate pain.   amLODipine 5 MG tablet Commonly known as: NORVASC TAKE 1 TABLET BY MOUTH DAILY   Anoro Ellipta 62.5-25 MCG/INH Aepb Generic drug: umeclidinium-vilanterol Inhale 1 puff into the lungs daily.   feeding supplement Liqd Take 237 mLs by mouth 2 (two) times daily between meals. What changed:   how much to take  when to take this   furosemide 40 MG tablet Commonly known as: LASIX Take 1.5 tablets (60 mg total) by mouth daily. (May take an extra 1/2 tab as needed for severe swelling.) What changed:   how much to take  additional instructions   ipratropium-albuterol 0.5-2.5 (3) MG/3ML Soln Commonly known as: DUONEB Take 3 mLs by nebulization every 4 (four) hours as needed.   metoprolol succinate 50 MG 24 hr tablet Commonly known as: TOPROL-XL Take 1 tablet (50 mg total) by mouth in the morning and at bedtime. One half twice daily   nitroGLYCERIN 0.4 MG SL tablet Commonly known as: NITROSTAT Place 1 tablet (0.4 mg total) under the tongue every 5 (five) minutes as needed for chest pain (CP or SOB).   pantoprazole 40 MG tablet Commonly known as: PROTONIX Take 1 tablet (40 mg total) by mouth daily. Start taking on: August 30, 2020   potassium chloride SA 20 MEQ tablet Commonly known as: KLOR-CON Take 1 tablet (20 mEq total) by mouth daily. Takes only when Furosemide is taken What changed: additional instructions   Vitamin D-3 125 MCG (5000 UT) Tabs Take 1 capsule by mouth daily as needed. Takes 3 or 4 times a week       Follow-up Information    Eloise Harman, DO. Schedule an appointment as soon as possible for a visit in 1 week(s).   Specialty: Internal Medicine Why: GI bleeding Contact information: [address] [place]  [place] [address] [phone]        Celene Squibb, MD. Schedule an appointment as soon as possible for a visit in 1 week(s).   Specialty: Internal Medicine Why: Hospital follow-up Contact information: [facility] [address] [phone]        Arnoldo Lenis, MD .   Specialty: Cardiology Contact information: [place] [address] [phone]        Arnoldo Lenis, MD. Schedule an appointment as soon as possible for a visit.   Specialty: Cardiology Why: Hospital follow-up Contact information: [place] [address] [phone]              Allergies  Allergen Reactions  . Lisinopril Hives  . Erythromycin Swelling  . Levaquin [Levofloxacin] Other (See Comments)    Tongue turns red and sore  . Penicillins Swelling    Has patient had a PCN reaction causing immediate rash, facial/tongue/throat swelling, SOB or lightheadedness with hypotension: Yes Has patient had a PCN reaction causing severe rash involving mucus membranes or skin necrosis: No Has patient had a PCN reaction that  required hospitalization No Has patient had a PCN reaction occurring within the last 10 years: No If all of the above answers are "NO", then may proceed with Cephalosporin use.   . Sulfa Antibiotics Other (See Comments)    Tongue turns red and sore    Consultations:  Gastroenterology   Procedures/Studies: DG Chest 2 View  Result Date: 08/27/2020 CLINICAL DATA:  Pt sent over from doctors office for a gi bleed, pt states that she is having black tarry stools. Hx of HTN, COPD, CHF, and pneumonia in 2017. Former smoker. Covid test negative on 5/18. EXAM: CHEST - 2 VIEW COMPARISON:  03/28/2020 FINDINGS: Lungs are hyperinflated with attenuated bronchovascular markings as before. No focal infiltrate or overt edema. Heart size and mediastinal contours are within normal limits. Aortic Atherosclerosis (ICD10-170.0). No effusion.   No pneumothorax. Osteopenia with thoracic kyphosis. Right shoulder DJD. Cholecystectomy clips. IMPRESSION: Hyperinflation. No acute disease. Electronically Signed   By: Lucrezia Europe M.D.   On: 08/27/2020 16:35     EGD (10/28) Impression:               - Mild Schatzki ring. Not manipulated/no dysphagia.                           - Small hiatal hernia.                           - Small duodenal ulcer with associated erosions. No                            high risk stigmata. Weblike duodenal diaphragms -                            traumatically dilated - typical finding of NSAID                            use. Duodenal diverticulum. Duodenal erosions.                            Status post gastric biopsy  Recommendation:           - Patient has a contact number available for                            emergencies. The signs and symptoms of potential                            delayed complications were discussed with the                            patient. Return to normal activities tomorrow.                            Written discharge instructions were provided to the                            patient.                           -  Advance diet as tolerated. Hold aspirin and                            ibuprofen. May resume 1 baby aspirin in 7 days if                            clinical benefits outweigh the risks. Protonix 40                            mg daily - indefinitely. Follow-up on pathology. No                            further GI evaluation at this time. Office visit                            with [place] in 12 weeks.                           -Would keep in the hospital another 24 hours to                            assure stability given her advanced age and                            comorbidities. Had a lengthy discussion with her                            daughter, Baker Janus, at [phone] regarding                            impression and recommendations.                            -I will follow up on biopsies.   Subjective: No melena. No issues overnight. Some right knee pain.  Discharge Exam: Vitals:   08/29/20 0543 08/29/20 0836  BP: (!) 130/51   Pulse: 84   Resp: 20   Temp: 98.1 F (36.7 C)   SpO2: 97% 99%   Vitals:   08/28/20 2030 08/28/20 2144 08/29/20 0543 08/29/20 0836  BP:  (!) 135/51 (!) 130/51   Pulse:  80 84   Resp:  20 20   Temp:  98 F (36.7 C) 98.1 F (36.7 C)   TempSrc:   Oral   SpO2: 95% 97% 97% 99%  Weight:      Height:        General: Pt is alert, awake, not in acute distress Cardiovascular: RRR, S1/S2 +, no rubs, no gallops Respiratory: CTA bilaterally, no wheezing, no rhonchi Abdominal: Soft, NT, ND, bowel sounds + Extremities: no edema, no cyanosis    The results of significant diagnostics from this hospitalization (including imaging, microbiology, ancillary and laboratory) are listed below for reference.     Microbiology: Recent Results (from the past 240 hour(s))  Respiratory Panel by RT PCR (Flu A&B, Covid) - Nasopharyngeal Swab     Status: None   Collection Time: 08/27/20  3:44 PM   Specimen: Nasopharyngeal Swab  Result Value  Ref Range Status   SARS Coronavirus 2 by RT PCR NEGATIVE NEGATIVE Final    Comment: (NOTE) SARS-CoV-2 target nucleic acids are NOT DETECTED.  The SARS-CoV-2 RNA is generally detectable in upper respiratoy specimens during the acute phase of infection. The lowest concentration of SARS-CoV-2 viral copies this assay can detect is 131 copies/mL. A negative result does not preclude SARS-Cov-2 infection and should not be used as the sole basis for treatment or other patient management decisions. A negative result may occur with  improper specimen collection/handling, submission of specimen other than nasopharyngeal swab, presence of viral mutation(s) within the areas targeted by this assay, and inadequate number of viral copies (<131 copies/mL). A negative result must be combined with  clinical observations, patient history, and epidemiological information. The expected result is Negative.  Fact Sheet for Patients:  PinkCheek.be  Fact Sheet for Healthcare Providers:  GravelBags.it  This test is no t yet approved or cleared by the [place] FDA and  has been authorized for detection and/or diagnosis of SARS-CoV-2 by FDA under an Emergency Use Authorization (EUA). This EUA will remain  in effect (meaning this test can be used) for the duration of the COVID-19 declaration under Section 564(b)(1) of the Act, 21 U.S.C. section 360bbb-3(b)(1), unless the authorization is terminated or revoked sooner.     Influenza A by PCR NEGATIVE NEGATIVE Final   Influenza B by PCR NEGATIVE NEGATIVE Final    Comment: (NOTE) The Xpert Xpress SARS-CoV-2/FLU/RSV assay is intended as an aid in  the diagnosis of influenza from Nasopharyngeal swab specimens and  should not be used as a sole basis for treatment. Nasal washings and  aspirates are unacceptable for Xpert Xpress SARS-CoV-2/FLU/RSV  testing.  Fact Sheet for Patients: PinkCheek.be  Fact Sheet for Healthcare Providers: GravelBags.it  This test is not yet approved or cleared by the [place] FDA and  has been authorized for detection and/or diagnosis of SARS-CoV-2 by  FDA under an Emergency Use Authorization (EUA). This EUA will remain  in effect (meaning this test can be used) for the duration of the  Covid-19 declaration under Section 564(b)(1) of the Act, 21  U.S.C. section 360bbb-3(b)(1), unless the authorization is  terminated or revoked. Performed at [facility], [address] [place], [place] [address]      Labs: BNP (last 3 results) No results for input(s): BNP in the last 8760 hours. Basic Metabolic Panel: Recent Labs  Lab 08/27/20 1555 08/28/20 0547  NA 135 139  K 2.8* 3.9  CL 94*  108  CO2 30 25  GLUCOSE 110* 100*  BUN 28* 22  CREATININE 0.70 0.63  CALCIUM 9.1 8.3*  MG 1.9  --    Liver Function Tests: Recent Labs  Lab 08/27/20 1555  AST 16  ALT 11  ALKPHOS 74  BILITOT 0.5  PROT 6.8  ALBUMIN 3.3*   No results for input(s): LIPASE, AMYLASE in the last 168 hours. No results for input(s): AMMONIA in the last 168 hours. CBC: Recent Labs  Lab 08/27/20 1555 08/28/20 0547 08/28/20 0945 08/29/20 0822  WBC 13.3* 10.9*  --  9.5  NEUTROABS 10.7*  --   --   --   HGB 8.5* 8.0* 8.9* 8.2*  HCT 26.3* 25.9* 29.0* 26.6*  MCV 92.3 89.9  --  92.0  PLT 323 241  --  256   Cardiac Enzymes: No results for input(s): CKTOTAL, CKMB, CKMBINDEX, TROPONINI in the last 168 hours. BNP: Invalid input(s): POCBNP CBG: Recent Labs  Lab 08/28/20 0858  GLUCAP 93   D-Dimer No results for input(s): DDIMER in the last 72 hours. Hgb A1c No results for input(s): HGBA1C in the last 72 hours. Lipid Profile No results for input(s): CHOL, HDL, LDLCALC, TRIG, CHOLHDL, LDLDIRECT in the last 72 hours. Thyroid function studies No results for input(s): TSH, T4TOTAL, T3FREE, THYROIDAB in the last 72 hours.  Invalid input(s): FREET3 Anemia work up No results for input(s): VITAMINB12, FOLATE, FERRITIN, TIBC, IRON, RETICCTPCT in the last 72 hours. Urinalysis    Component Value Date/Time   COLORURINE STRAW (A) 05/23/2017 1151   APPEARANCEUR CLEAR 05/23/2017 1151   LABSPEC 1.005 05/23/2017 1151   PHURINE 7.0 05/23/2017 1151   GLUCOSEU NEGATIVE 05/23/2017 1151   HGBUR SMALL (A) 05/23/2017 1151   BILIRUBINUR NEGATIVE 05/23/2017 1151   KETONESUR NEGATIVE 05/23/2017 1151   PROTEINUR NEGATIVE 05/23/2017 1151   NITRITE NEGATIVE 05/23/2017 1151   LEUKOCYTESUR NEGATIVE 05/23/2017 1151   Sepsis Labs Invalid input(s): PROCALCITONIN,  WBC,  LACTICIDVEN Microbiology Recent Results (from the past 240 hour(s))  Respiratory Panel by RT PCR (Flu A&B, Covid) - Nasopharyngeal Swab     Status:  None   Collection Time: 08/27/20  3:44 PM   Specimen: Nasopharyngeal Swab  Result Value Ref Range Status   SARS Coronavirus 2 by RT PCR NEGATIVE NEGATIVE Final    Comment: (NOTE) SARS-CoV-2 target nucleic acids are NOT DETECTED.  The SARS-CoV-2 RNA is generally detectable in upper respiratoy specimens during the acute phase of infection. The lowest concentration of SARS-CoV-2 viral copies this assay can detect is 131 copies/mL. A negative result does not preclude SARS-Cov-2 infection and should not be used as the sole basis for treatment or other patient management decisions. A negative result may occur with  improper specimen collection/handling, submission of specimen other than nasopharyngeal swab, presence of viral mutation(s) within the areas targeted by this assay, and inadequate number of viral copies (<131 copies/mL). A negative result must be combined with clinical observations, patient history, and epidemiological information. The expected result is Negative.  Fact Sheet for Patients:  PinkCheek.be  Fact Sheet for Healthcare Providers:  GravelBags.it  This test is no t yet approved or cleared by the [place] FDA and  has been authorized for detection and/or diagnosis of SARS-CoV-2 by FDA under an Emergency Use Authorization (EUA). This EUA will remain  in effect (meaning this test can be used) for the duration of the COVID-19 declaration under Section 564(b)(1) of the Act, 21 U.S.C. section 360bbb-3(b)(1), unless the authorization is terminated or revoked sooner.     Influenza A by PCR NEGATIVE NEGATIVE Final   Influenza B by PCR NEGATIVE NEGATIVE Final    Comment: (NOTE) The Xpert Xpress SARS-CoV-2/FLU/RSV assay is intended as an aid in  the diagnosis of influenza from Nasopharyngeal swab specimens and  should not be used as a sole basis for treatment. Nasal washings and  aspirates are unacceptable for  Xpert Xpress SARS-CoV-2/FLU/RSV  testing.  Fact Sheet for Patients: PinkCheek.be  Fact Sheet for Healthcare Providers: GravelBags.it  This test is not yet approved or cleared by the [place] FDA and  has been authorized for detection and/or diagnosis of SARS-CoV-2 by  FDA under an Emergency Use Authorization (EUA). This EUA will remain  in effect (meaning this test can be used) for the duration of the  Covid-19 declaration under Section 564(b)(1) of the Act, 21  U.S.C. section 360bbb-3(b)(1), unless the authorization is  terminated or revoked. Performed at [facility]  [facility], [address], [place], [place] [address]      Time coordinating discharge: 35 minutes  SIGNED:   Cordelia Poche, MD [facility] 08/29/2020, 2:18 PM

## 2020-08-31 LAB — BPAM RBC
Blood Product Expiration Date: 202111262359
ISSUE DATE / TIME: 202110280020
Unit Type and Rh: 5100

## 2020-08-31 LAB — TYPE AND SCREEN
ABO/RH(D): O POS
Antibody Screen: POSITIVE
Donor AG Type: NEGATIVE
Donor AG Type: NEGATIVE
Unit division: 0
Unit division: 0

## 2020-09-01 ENCOUNTER — Telehealth: Payer: Self-pay | Admitting: Cardiology

## 2020-09-01 ENCOUNTER — Encounter (HOSPITAL_COMMUNITY): Payer: Self-pay | Admitting: Internal Medicine

## 2020-09-01 DIAGNOSIS — I5032 Chronic diastolic (congestive) heart failure: Secondary | ICD-10-CM | POA: Diagnosis not present

## 2020-09-01 DIAGNOSIS — J440 Chronic obstructive pulmonary disease with acute lower respiratory infection: Secondary | ICD-10-CM | POA: Diagnosis not present

## 2020-09-01 DIAGNOSIS — R748 Abnormal levels of other serum enzymes: Secondary | ICD-10-CM | POA: Diagnosis not present

## 2020-09-01 NOTE — Telephone Encounter (Signed)
I spoke with daughter who reports they will see Dr.Hall at 2 pm today and will run STAT labs today.

## 2020-09-01 NOTE — Telephone Encounter (Signed)
New Message     Patient was in ER last week , she is having sob    Pt c/o Shortness Of Breath: STAT if SOB developed within the last 24 hours or pt is noticeably SOB on the phone  1. Are you currently SOB (can you hear that pt is SOB on the phone)?  No daughter called   2. How long have you been experiencing SOB? Last wednesday  3. Are you SOB when sitting or when up moving around? Both -its getting worse can breathe laying down   4. Are you currently experiencing any other symptoms? No patient was in hospital for bleeding ulcer ,

## 2020-09-02 ENCOUNTER — Telehealth: Payer: Self-pay | Admitting: Nurse Practitioner

## 2020-09-02 ENCOUNTER — Other Ambulatory Visit: Payer: Self-pay | Admitting: Cardiology

## 2020-09-02 NOTE — Telephone Encounter (Signed)
Noted  

## 2020-09-02 NOTE — Telephone Encounter (Signed)
Received message back from Dr. Harl Bowie who states he talked with the patient's primary team about risks v. Benefits of ASA. Will hold ASA for now given recent bleed. They will reassess at her cardiology follow-up appointment.  Cc: Neil Crouch who has follow-up appointment 11/25/20

## 2020-09-10 DIAGNOSIS — I5032 Chronic diastolic (congestive) heart failure: Secondary | ICD-10-CM | POA: Diagnosis not present

## 2020-09-10 DIAGNOSIS — J440 Chronic obstructive pulmonary disease with acute lower respiratory infection: Secondary | ICD-10-CM | POA: Diagnosis not present

## 2020-09-10 DIAGNOSIS — Z0189 Encounter for other specified special examinations: Secondary | ICD-10-CM | POA: Diagnosis not present

## 2020-09-10 DIAGNOSIS — R748 Abnormal levels of other serum enzymes: Secondary | ICD-10-CM | POA: Diagnosis not present

## 2020-09-10 DIAGNOSIS — R531 Weakness: Secondary | ICD-10-CM | POA: Diagnosis not present

## 2020-09-10 DIAGNOSIS — K922 Gastrointestinal hemorrhage, unspecified: Secondary | ICD-10-CM | POA: Diagnosis not present

## 2020-09-10 DIAGNOSIS — D500 Iron deficiency anemia secondary to blood loss (chronic): Secondary | ICD-10-CM | POA: Diagnosis not present

## 2020-09-11 ENCOUNTER — Other Ambulatory Visit: Payer: Self-pay | Admitting: Cardiology

## 2020-09-11 DIAGNOSIS — I10 Essential (primary) hypertension: Secondary | ICD-10-CM | POA: Diagnosis not present

## 2020-09-11 DIAGNOSIS — Z96641 Presence of right artificial hip joint: Secondary | ICD-10-CM | POA: Diagnosis not present

## 2020-09-11 DIAGNOSIS — I5021 Acute systolic (congestive) heart failure: Secondary | ICD-10-CM | POA: Diagnosis not present

## 2020-09-11 DIAGNOSIS — J449 Chronic obstructive pulmonary disease, unspecified: Secondary | ICD-10-CM | POA: Diagnosis not present

## 2020-09-11 MED ORDER — METOPROLOL SUCCINATE ER 100 MG PO TB24
50.0000 mg | ORAL_TABLET | Freq: Two times a day (BID) | ORAL | 1 refills | Status: DC
Start: 2020-09-11 — End: 2020-09-19

## 2020-09-11 NOTE — Telephone Encounter (Signed)
    1. Which medications need to be refilled? (please list name of each medication and dose if known) metoprolol succinate (TOPROL-XL) 50 MG    2. Which pharmacy/location (including street and city if local pharmacy) is medication to be sent to?  [facility]   3. Do they need a 30 day or 90 day supply?90   Patient called stating that [facility] told her that Dr. Harl Bowie had denied the medication.

## 2020-09-11 NOTE — Telephone Encounter (Signed)
Medication sent to pharmacy  

## 2020-09-11 NOTE — Telephone Encounter (Signed)
Pt confirmed dose

## 2020-09-11 NOTE — Telephone Encounter (Signed)
Patient confirmed she is taking toprol xl 100 mg 1/2 tablet BID

## 2020-09-19 ENCOUNTER — Telehealth: Payer: Self-pay | Admitting: Cardiology

## 2020-09-19 ENCOUNTER — Other Ambulatory Visit: Payer: Self-pay | Admitting: Cardiology

## 2020-09-19 MED ORDER — METOPROLOL SUCCINATE ER 100 MG PO TB24
50.0000 mg | ORAL_TABLET | Freq: Two times a day (BID) | ORAL | 6 refills | Status: DC
Start: 2020-09-19 — End: 2021-01-06

## 2020-09-19 NOTE — Telephone Encounter (Signed)
New message      *STAT* If patient is at the pharmacy, call can be transferred to refill team.   1. Which medications need to be refilled? (please list name of each medication and dose if known) metoprolol succinate (TOPROL-XL) 100 MG 24 hr tablet  2. Which pharmacy/location (including street and city if local pharmacy) is medication to be sent to? [facility]   3. Do they need a 30 day or 90 day supply? Mail order has not sent her RX - needs 10 day supply called in

## 2020-09-19 NOTE — Telephone Encounter (Signed)
Refill complete 

## 2020-10-01 ENCOUNTER — Telehealth: Payer: Self-pay | Admitting: Cardiology

## 2020-10-01 ENCOUNTER — Ambulatory Visit: Payer: Medicare Other | Admitting: Internal Medicine

## 2020-10-01 NOTE — Telephone Encounter (Signed)
Spoke with patient's daughter today, patient admitted with anemia requiring transfusion, found to have an ulcer. ASA stopped during admission with recs to restart in 1 week. I would recommend staying off ASA for now until cardiology f/u. She has not had a prior MI or stroke, she had an abnormal stress test with presumed CAD but no clinical events. Will need to see how blood counts do over next few weeks before restarting ASA, im ok with being off for now.  Carlyle Dolly MD

## 2020-10-08 DIAGNOSIS — Z0189 Encounter for other specified special examinations: Secondary | ICD-10-CM | POA: Diagnosis not present

## 2020-10-08 DIAGNOSIS — I5032 Chronic diastolic (congestive) heart failure: Secondary | ICD-10-CM | POA: Diagnosis not present

## 2020-10-08 DIAGNOSIS — I10 Essential (primary) hypertension: Secondary | ICD-10-CM | POA: Diagnosis not present

## 2020-10-08 DIAGNOSIS — J440 Chronic obstructive pulmonary disease with acute lower respiratory infection: Secondary | ICD-10-CM | POA: Diagnosis not present

## 2020-10-08 DIAGNOSIS — I2510 Atherosclerotic heart disease of native coronary artery without angina pectoris: Secondary | ICD-10-CM | POA: Diagnosis not present

## 2020-10-08 DIAGNOSIS — J449 Chronic obstructive pulmonary disease, unspecified: Secondary | ICD-10-CM | POA: Diagnosis not present

## 2020-10-08 DIAGNOSIS — Z0000 Encounter for general adult medical examination without abnormal findings: Secondary | ICD-10-CM | POA: Diagnosis not present

## 2020-10-08 DIAGNOSIS — R748 Abnormal levels of other serum enzymes: Secondary | ICD-10-CM | POA: Diagnosis not present

## 2020-10-08 DIAGNOSIS — Z79899 Other long term (current) drug therapy: Secondary | ICD-10-CM | POA: Diagnosis not present

## 2020-10-08 DIAGNOSIS — R002 Palpitations: Secondary | ICD-10-CM | POA: Diagnosis not present

## 2020-10-11 DIAGNOSIS — Z96641 Presence of right artificial hip joint: Secondary | ICD-10-CM | POA: Diagnosis not present

## 2020-10-11 DIAGNOSIS — J449 Chronic obstructive pulmonary disease, unspecified: Secondary | ICD-10-CM | POA: Diagnosis not present

## 2020-10-11 DIAGNOSIS — I5021 Acute systolic (congestive) heart failure: Secondary | ICD-10-CM | POA: Diagnosis not present

## 2020-10-11 DIAGNOSIS — I10 Essential (primary) hypertension: Secondary | ICD-10-CM | POA: Diagnosis not present

## 2020-10-16 ENCOUNTER — Other Ambulatory Visit: Payer: Self-pay | Admitting: Cardiology

## 2020-11-11 DIAGNOSIS — Z96641 Presence of right artificial hip joint: Secondary | ICD-10-CM | POA: Diagnosis not present

## 2020-11-11 DIAGNOSIS — J449 Chronic obstructive pulmonary disease, unspecified: Secondary | ICD-10-CM | POA: Diagnosis not present

## 2020-11-11 DIAGNOSIS — I5021 Acute systolic (congestive) heart failure: Secondary | ICD-10-CM | POA: Diagnosis not present

## 2020-11-11 DIAGNOSIS — I10 Essential (primary) hypertension: Secondary | ICD-10-CM | POA: Diagnosis not present

## 2020-11-18 NOTE — Progress Notes (Deleted)
Cardiology Office Note    Date:  11/18/2020   ID:  Hannah Mckee, DOB [birth year], MRN [id number]  PCP:  Celene Squibb, MD  Cardiologist: Carlyle Dolly, MD EPS: None  No chief complaint on file.   History of Present Illness:  Hannah Mckee is a 87 y.o. female with history of Chronic diastolic CHF-echo 42/7062 LVEF 35-40%. Family asked to defer ischemic testing at that time.  f/u echo after medical therapy showed normalization of LVEF. 04/2017 echo LVEF 37-62%, grade I diastolic dysfunction. Mild AI. 09/2018 LVEF 83-15%, grade I diastolic dysfunction - lisionpril caused rash.Also has COPD followed by Dr. Melvyn Novas, Presumed CAD with abn NST 11/2018 but family defered further workup.   Recent admission for anemia requiring transfusion, asa held pending CBC trends     Past Medical History:  Diagnosis Date  . Acute blood loss anemia 08/27/2020  . Cancer (HCC)    Skin, basal  . CHF (congestive heart failure) ([place])   . Complication of anesthesia 1950's   'Years ago difficult awaking up"  . COPD (chronic obstructive pulmonary disease) ([place])   . Coronary artery calcification seen on CT scan    Multivessel  . Dyspnea    short of breath walking in home, breathing normal after sitting down  . History of blood transfusion   . History of cardiomyopathy    a. EF 35-40% by echo in 10/2016 b. improved to 55-60% by echo in 04/2017  . History of kidney stones   . History of shingles   . Hypertension   . Kyphosis    marked severity  . Osteoarthritis   . Pneumonia 10/04/2016  . Salivary gland stone   . Sepsis [facility])     Past Surgical History:  Procedure Laterality Date  . BIOPSY  08/28/2020   Procedure: BIOPSY;  Surgeon: Daneil Dolin, MD;  [facility];  Service: Endoscopy;;  . BLADDER SURGERY    . BUNIONECTOMY    . CATARACT EXTRACTION Bilateral   . CHOLECYSTECTOMY    . COLONOSCOPY  11/16/2012   Procedure: COLONOSCOPY;  Surgeon: Rogene Houston, MD;  [facility];  Service: Endoscopy;  Laterality: N/A;  100  . ESOPHAGOGASTRODUODENOSCOPY (EGD) WITH PROPOFOL N/A 08/28/2020   Procedure: ESOPHAGOGASTRODUODENOSCOPY (EGD) WITH PROPOFOL;  Surgeon: Daneil Dolin, MD;  [facility];  Service: Endoscopy;  Laterality: N/A;  . LITHOTRIPSY    . MASS EXCISION N/A 03/21/2020   Procedure: EXCISION ORAL MASS;  Surgeon: Leta Baptist, MD;  Location: [place];  Service: ENT;  Laterality: N/A;  . ORIF PERIPROSTHETIC FRACTURE Right 10/29/2014   Procedure: OPEN REDUCTION INTERNAL FIXATION (ORIF) PERIPROSTHETIC FEMUR FRACTURE/FEMUR SHAFT;  Surgeon: Mcarthur Rossetti, MD;  Location: [place];  Service: Orthopedics;  Laterality: Right;  . TONSILLECTOMY    . TOTAL HIP ARTHROPLASTY     1989    Current Medications: No outpatient medications have been marked as taking for the 11/24/20 encounter (Appointment) with Hannah Burn, PA-C.     Allergies:   Lisinopril, Erythromycin, Levaquin [levofloxacin], Penicillins, and Sulfa antibiotics   Social History   Socioeconomic History  . Marital status: Divorced    Spouse name: Not on file  . Number of children: Not on file  . Years of education: Not on file  . Highest education level: Not on file  Occupational History  . Not on file  Tobacco Use  . Smoking status: Former Smoker    Packs/day: 0.50  Years: 35.00    Pack years: 17.50    Types: Cigarettes    Quit date: 01/1997    Years since quitting: 23.8  . Smokeless tobacco: Never Used  Vaping Use  . Vaping Use: Never used  Substance and Sexual Activity  . Alcohol use: No  . Drug use: No  . Sexual activity: Not Currently  Other Topics Concern  . Not on file  Social History Narrative  . Not on file   Social Determinants of Health   Financial Resource Strain: Not on file  Food Insecurity: Not on file  Transportation Needs: Not on file  Physical Activity: Not on file  Stress: Not on file  Social Connections: Not on file     Family  History:  The patient's ***family history includes Hypertension in her sister.   ROS:   Please see the history of present illness.    ROS All other systems reviewed and are negative.   PHYSICAL EXAM:   VS:  There were no vitals taken for this visit.  Physical Exam  GEN: Well nourished, well developed, in no acute distress  HEENT: normal  Neck: no JVD, carotid bruits, or masses Cardiac:RRR; no murmurs, rubs, or gallops  Respiratory:  clear to auscultation bilaterally, normal work of breathing GI: soft, nontender, nondistended, + BS Ext: without cyanosis, clubbing, or edema, Good distal pulses bilaterally MS: no deformity or atrophy  Skin: warm and dry, no rash Neuro:  Alert and Oriented x 3, Strength and sensation are intact Psych: euthymic mood, full affect  Wt Readings from Last 3 Encounters:  08/27/20 102 lb 4.7 oz (46.4 kg)  08/20/20 101 lb (45.8 kg)  05/22/20 100 lb 12.8 oz (45.7 kg)      Studies/Labs Reviewed:   EKG:  EKG is*** ordered today.  The ekg ordered today demonstrates ***  Recent Labs: 08/27/2020: ALT 11; Magnesium 1.9 08/28/2020: BUN 22; Creatinine, Ser 0.63; Potassium 3.9; Sodium 139 08/29/2020: Hemoglobin 8.2; Platelets 256   Lipid Panel No results found for: CHOL, TRIG, HDL, CHOLHDL, VLDL, LDLCALC, LDLDIRECT  Additional studies/ records that were reviewed today include:  Echo 2019 Study Conclusions   - Left ventricle: The cavity size was normal. Wall thickness was    increased in a pattern of mild LVH. Systolic function was normal.    The estimated ejection fraction was in the range of 55% to 60%.    Wall motion was normal; there were no regional wall motion    abnormalities. Doppler parameters are consistent with abnormal    left ventricular relaxation (grade 1 diastolic dysfunction).  - Aortic valve: Moderately calcified annulus. Trileaflet; mildly    calcified leaflets. There was mild regurgitation.  - Mitral valve: Moderately calcified  annulus. There was mild    regurgitation.  - Left atrium: The atrium was moderately dilated.  - Right atrium: The atrium was at the upper limits of normal in    size. Central venous pressure (est): 3 mm Hg.  - Atrial septum: No defect or patent foramen ovale was identified.  - Tricuspid valve: There was mild regurgitation.  - Pulmonary arteries: PA peak pressure: 37 mm Hg (S).  - Pericardium, extracardiac: A prominent pericardial fat pad was    present. A possible, small pericardial effusion was identified    basal anterior to the heart.     Risk Assessment/Calculations:   {Does this patient have ATRIAL FIBRILLATION?:[id number]}     ASSESSMENT:    No diagnosis found.   PLAN:  In  order of problems listed above:  Chronic diastolic CHFecho 64/3329 LVEF 35-40%. Family asked to defer ischemic testing at that time.   f/u echo after medical therapy showed normalization of LVEF. 04/2017 echo LVEF 51-88%, grade I diastolic dysfunction. Mild AI. 09/2018 LVEF 41-66%, grade I diastolic dysfunction - lisionpril caused rash.   COPD followed by Dr. Melvyn Novas  Presumed CAD with abn NST 11/2018 but family defered further workup  Recent admission for anemia requiring transfusion, asa held pending CBC trends  Shared Decision Making/Informed Consent   {Are you ordering a CV Procedure (e.g. stress test, cath, DCCV, TEE, etc)?   Press F2        :[id number]}    Medication Adjustments/Labs and Tests Ordered: Current medicines are reviewed at length with the patient today.  Concerns regarding medicines are outlined above.  Medication changes, Labs and Tests ordered today are listed in the Patient Instructions below. There are no Patient Instructions on file for this visit.   Signed, Ermalinda Barrios, PA-C  11/18/2020 1:10 PM    [facility] [address], [place], [place]  [address] Phone: [phone]; Fax: [phone]

## 2020-11-24 ENCOUNTER — Ambulatory Visit: Payer: Medicare Other | Admitting: Physician Assistant

## 2020-11-24 DIAGNOSIS — I5032 Chronic diastolic (congestive) heart failure: Secondary | ICD-10-CM

## 2020-11-24 DIAGNOSIS — J438 Other emphysema: Secondary | ICD-10-CM

## 2020-11-25 ENCOUNTER — Ambulatory Visit: Payer: Medicare Other | Admitting: Gastroenterology

## 2020-12-08 NOTE — Progress Notes (Signed)
Cardiology Office Note  Date: 12/09/2020   ID: Hannah Mckee, DOB July 05, 1931, MRN [id number]  PCP:  Celene Squibb, MD  Cardiologist:  Carlyle Dolly, MD Electrophysiologist:  None   Chief Complaint: 6-month cardiac follow-up.  History of Present Illness: Hannah Mckee is a 87 y.o. female with a history of chronic diastolic heart failure, HTN, COPD.  Dyspnea.  Last encounter with Dr. Harl Bowie 04/09/2020 with complaints of shortness of breath history of chronic diastolic heart failure.  Last echo 09/2018 EF 55 to 60%.  G1 DD.  Home weights 112.  Lower extremity edema up and down.  Chronic shortness of breath secondary to COPD and CHF.  Some hypoxia on home O2.  She was euvolemic at that visit.  She was continuing current diuretic.  Shortness of breath appear to be possibly aspiration versus bronchitis improved with antibiotics.  No signs of fluid overload.  Previously seen by Dr. Luan Pulling for COPD.  She wanted to establish with pulmonology in [place].  She is here for 37-month follow-up.  She denies any recent acute illnesses or hospitalizations.  She states she has been basically staying at home sitting around watching TV and making candy.  Has not been very active at all.  She is here with her daughter.  She is using a walker/Rollator to ambulate.  She denies any anginal or exertional symptoms, PND, orthopnea, weight gain, lower extremity edema.  She is taking all of her medications as directed.    Past Medical History:  Diagnosis Date  . Acute blood loss anemia 08/27/2020  . Cancer (HCC)    Skin, basal  . CHF (congestive heart failure) ([place])   . Complication of anesthesia 1950's   'Years ago difficult awaking up"  . COPD (chronic obstructive pulmonary disease) ([place])   . Coronary artery calcification seen on CT scan    Multivessel  . Dyspnea    short of breath walking in home, breathing normal after sitting down  . History of blood transfusion   . History of cardiomyopathy     a. EF 35-40% by echo in 10/2016 b. improved to 55-60% by echo in 04/2017  . History of kidney stones   . History of shingles   . Hypertension   . Kyphosis    marked severity  . Osteoarthritis   . Pneumonia 10/04/2016  . Salivary gland stone   . Sepsis [facility])     Past Surgical History:  Procedure Laterality Date  . BIOPSY  08/28/2020   Procedure: BIOPSY;  Surgeon: Daneil Dolin, MD;  [facility];  Service: Endoscopy;;  . BLADDER SURGERY    . BUNIONECTOMY    . CATARACT EXTRACTION Bilateral   . CHOLECYSTECTOMY    . COLONOSCOPY  11/16/2012   Procedure: COLONOSCOPY;  Surgeon: Rogene Houston, MD;  [facility];  Service: Endoscopy;  Laterality: N/A;  100  . ESOPHAGOGASTRODUODENOSCOPY (EGD) WITH PROPOFOL N/A 08/28/2020   Procedure: ESOPHAGOGASTRODUODENOSCOPY (EGD) WITH PROPOFOL;  Surgeon: Daneil Dolin, MD;  [facility];  Service: Endoscopy;  Laterality: N/A;  . LITHOTRIPSY    . MASS EXCISION N/A 03/21/2020   Procedure: EXCISION ORAL MASS;  Surgeon: Leta Baptist, MD;  Location: [place];  Service: ENT;  Laterality: N/A;  . ORIF PERIPROSTHETIC FRACTURE Right 10/29/2014   Procedure: OPEN REDUCTION INTERNAL FIXATION (ORIF) PERIPROSTHETIC FEMUR FRACTURE/FEMUR SHAFT;  Surgeon: Mcarthur Rossetti, MD;  Location: [place];  Service: Orthopedics;  Laterality: Right;  . TONSILLECTOMY    .  TOTAL HIP ARTHROPLASTY     1989    Current Outpatient Medications  Medication Sig Dispense Refill  . acetaminophen (TYLENOL) 500 MG tablet Take 1,000 mg by mouth every 6 (six) hours as needed for moderate pain.    Marland Kitchen ALPRAZolam (XANAX) 0.25 MG tablet Take 0.025 mg by mouth at bedtime.    Marland Kitchen amLODipine (NORVASC) 5 MG tablet TAKE 1 TABLET BY MOUTH DAILY 90 tablet 2  . Cholecalciferol (VITAMIN D-3) 125 MCG (5000 UT) TABS Take 1 capsule by mouth daily as needed. Takes 3 or 4 times a week    . feeding supplement, ENSURE ENLIVE, (ENSURE ENLIVE) LIQD Take 237 mLs by mouth 2 (two) times  daily between meals. (Patient taking differently: Take 118.5 mLs by mouth daily.) 237 mL 12  . furosemide (LASIX) 40 MG tablet Take 40 mg by mouth daily. May take an extra 1/2 tab as needed    . ipratropium-albuterol (DUONEB) 0.5-2.5 (3) MG/3ML SOLN Take 3 mLs by nebulization every 4 (four) hours as needed.    . Iron-Vitamins (S.S.S. TONIC) LIQD Take by mouth daily. 3 tablespoons daily    . metoprolol succinate (TOPROL-XL) 100 MG 24 hr tablet Take 0.5 tablets (50 mg total) by mouth in the morning and at bedtime. Take with or immediately following a meal. 30 tablet 6  . nitroGLYCERIN (NITROSTAT) 0.4 MG SL tablet Place 1 tablet (0.4 mg total) under the tongue every 5 (five) minutes as needed for chest pain (CP or SOB). 25 tablet 3  . pantoprazole (PROTONIX) 40 MG tablet Take 1 tablet (40 mg total) by mouth daily. 30 tablet 0  . potassium chloride SA (KLOR-CON) 20 MEQ tablet TAKE 1 TABLET BY MOUTH  DAILY . TAKE ONLY WHEN  FUROSEMIDE IS TAKEN. 90 tablet 3  . umeclidinium-vilanterol (ANORO ELLIPTA) 62.5-25 MCG/INH AEPB Inhale 1 puff into the lungs daily.     No current facility-administered medications for this visit.   Allergies:  Lisinopril, Erythromycin, Levaquin [levofloxacin], Penicillins, and Sulfa antibiotics   Social History: The patient  reports that she quit smoking about 23 years ago. Her smoking use included cigarettes. She has a 17.50 pack-year smoking history. She has never used smokeless tobacco. She reports that she does not drink alcohol and does not use drugs.   Family History: The patient's family history includes Hypertension in her sister.   ROS:  Please see the history of present illness. Otherwise, complete review of systems is positive for none.  All other systems are reviewed and negative.   Physical Exam: VS:  BP (!) 124/58   Pulse 100   Ht 4\' 7"  (1.397 m)   Wt 97 lb 6.4 oz (44.2 kg)   SpO2 99%   BMI 22.64 kg/m , BMI Body mass index is 22.64 kg/m.  Wt Readings from  Last 3 Encounters:  12/09/20 97 lb 6.4 oz (44.2 kg)  08/27/20 102 lb 4.7 oz (46.4 kg)  08/20/20 101 lb (45.8 kg)    General: Patient appears comfortable at rest. Neck: Supple, no elevated JVP or carotid bruits, no thyromegaly. Lungs: Clear to auscultation, nonlabored breathing at rest. Cardiac: Regular rate and rhythm, no S3 or significant systolic murmur, no pericardial rub. Extremities: No pitting edema, distal pulses 2+. Skin: Warm and dry. Musculoskeletal: No kyphosis. Neuropsychiatric: Alert and oriented x3, affect grossly appropriate.  ECG:    Recent Labwork: 08/27/2020: ALT 11; AST 16; Magnesium 1.9 08/28/2020: BUN 22; Creatinine, Ser 0.63; Potassium 3.9; Sodium 139 08/29/2020: Hemoglobin 8.2; Platelets 256  No results found for: CHOL, TRIG, HDL, CHOLHDL, VLDL, LDLCALC, LDLDIRECT  Other Studies Reviewed Today:   Echocardiogram 09/07/2018 Study Conclusions   - Left ventricle: The cavity size was normal. Wall thickness was  increased in a pattern of mild LVH. Systolic function was normal.  The estimated ejection fraction was in the range of 55% to 60%.  Wall motion was normal; there were no regional wall motion  abnormalities. Doppler parameters are consistent with abnormal  left ventricular relaxation (grade 1 diastolic dysfunction).  - Aortic valve: Moderately calcified annulus. Trileaflet; mildly  calcified leaflets. There was mild regurgitation.  - Mitral valve: Moderately calcified annulus. There was mild  regurgitation.  - Left atrium: The atrium was moderately dilated.  - Right atrium: The atrium was at the upper limits of normal in  size. Central venous pressure (est): 3 mm Hg.  - Atrial septum: No defect or patent foramen ovale was identified.  - Tricuspid valve: There was mild regurgitation.  - Pulmonary arteries: PA peak pressure: 37 mm Hg (S).  - Pericardium, extracardiac: A prominent pericardial fat pad was  present. A possible, small  pericardial effusion was identified  basal anterior to the heart.   Assessment and Plan:  1. Chronic diastolic heart failure ([place])   2. SOB (shortness of breath)   3. Other emphysema ([place])   4. Essential hypertension    1. Chronic diastolic heart failure (HCC) No evidence of fluid volume overload.  No recent weight gain.  In fact she has lost 5 pounds since October.  Has continued chronic dyspnea.  No lower extremity edema or PND/orthopnea.  Continue Toprol-XL 50 mg p.o. twice daily.  Continue Lasix 40 mg daily and 1/2 tablet as needed.  2. SOB (shortness of breath) States he is not tolerating activity as much as she used to be able to tolerate.  States when she is up walking around for a while she has to sit down and rest.  States she may have a little more shortness of breath than usual but nothing significant.  No evidence of volume overload i.e. shortness of breath, weight gain, PND, orthopnea, or lower extremity edema.  Continue Lasix 40 mg daily and one half tab as needed.  3. Other emphysema (HCC) Some activity intolerance and shortness of breath when being more active but nothing which appears to be progressing.  She states she has not been very active recently and sitting around a lot.  4.  Essential hypertension Blood pressure well controlled at 124/58 today.  Continue amlodipine 5 mg p.o. daily.    Medication Adjustments/Labs and Tests Ordered: Current medicines are reviewed at length with the patient today.  Concerns regarding medicines are outlined above.   Disposition: Follow-up with Dr. Harl Bowie or APP 6 months  Signed, Levell July, NP 12/09/2020 5:10 PM    [facility] [place], [place], [place] [address] Phone: [phone]; Fax: [phone]

## 2020-12-09 ENCOUNTER — Other Ambulatory Visit: Payer: Self-pay

## 2020-12-09 ENCOUNTER — Encounter: Payer: Self-pay | Admitting: Family Medicine

## 2020-12-09 ENCOUNTER — Ambulatory Visit: Payer: Medicare Other | Admitting: Physician Assistant

## 2020-12-09 ENCOUNTER — Ambulatory Visit (INDEPENDENT_AMBULATORY_CARE_PROVIDER_SITE_OTHER): Payer: Medicare Other | Admitting: Family Medicine

## 2020-12-09 VITALS — BP 124/58 | HR 100 | Ht <= 58 in | Wt 97.4 lb

## 2020-12-09 DIAGNOSIS — I10 Essential (primary) hypertension: Secondary | ICD-10-CM | POA: Diagnosis not present

## 2020-12-09 DIAGNOSIS — R0602 Shortness of breath: Secondary | ICD-10-CM

## 2020-12-09 DIAGNOSIS — J438 Other emphysema: Secondary | ICD-10-CM | POA: Diagnosis not present

## 2020-12-09 DIAGNOSIS — I5032 Chronic diastolic (congestive) heart failure: Secondary | ICD-10-CM

## 2020-12-09 NOTE — Patient Instructions (Addendum)
Medication Instructions:  Continue all current medications.   Labwork: none  Testing/Procedures: none  Follow-Up: 6 months   Any Other Special Instructions Will Be Listed Below (If Applicable).   If you need a refill on your cardiac medications before your next appointment, please call your pharmacy.  

## 2020-12-12 DIAGNOSIS — I5021 Acute systolic (congestive) heart failure: Secondary | ICD-10-CM | POA: Diagnosis not present

## 2020-12-12 DIAGNOSIS — I10 Essential (primary) hypertension: Secondary | ICD-10-CM | POA: Diagnosis not present

## 2020-12-12 DIAGNOSIS — Z96641 Presence of right artificial hip joint: Secondary | ICD-10-CM | POA: Diagnosis not present

## 2020-12-12 DIAGNOSIS — J449 Chronic obstructive pulmonary disease, unspecified: Secondary | ICD-10-CM | POA: Diagnosis not present

## 2021-01-05 ENCOUNTER — Other Ambulatory Visit: Payer: Self-pay | Admitting: Cardiology

## 2021-01-05 DIAGNOSIS — Z0189 Encounter for other specified special examinations: Secondary | ICD-10-CM | POA: Diagnosis not present

## 2021-01-05 DIAGNOSIS — I5032 Chronic diastolic (congestive) heart failure: Secondary | ICD-10-CM | POA: Diagnosis not present

## 2021-01-05 DIAGNOSIS — Z712 Person consulting for explanation of examination or test findings: Secondary | ICD-10-CM | POA: Diagnosis not present

## 2021-01-05 DIAGNOSIS — Z0000 Encounter for general adult medical examination without abnormal findings: Secondary | ICD-10-CM | POA: Diagnosis not present

## 2021-01-05 DIAGNOSIS — R748 Abnormal levels of other serum enzymes: Secondary | ICD-10-CM | POA: Diagnosis not present

## 2021-01-07 DIAGNOSIS — F5101 Primary insomnia: Secondary | ICD-10-CM | POA: Diagnosis not present

## 2021-01-07 DIAGNOSIS — D509 Iron deficiency anemia, unspecified: Secondary | ICD-10-CM | POA: Diagnosis not present

## 2021-01-07 DIAGNOSIS — R059 Cough, unspecified: Secondary | ICD-10-CM | POA: Diagnosis not present

## 2021-01-07 DIAGNOSIS — I2510 Atherosclerotic heart disease of native coronary artery without angina pectoris: Secondary | ICD-10-CM | POA: Diagnosis not present

## 2021-01-07 DIAGNOSIS — R002 Palpitations: Secondary | ICD-10-CM | POA: Diagnosis not present

## 2021-01-07 DIAGNOSIS — I10 Essential (primary) hypertension: Secondary | ICD-10-CM | POA: Diagnosis not present

## 2021-01-07 DIAGNOSIS — M150 Primary generalized (osteo)arthritis: Secondary | ICD-10-CM | POA: Diagnosis not present

## 2021-01-07 DIAGNOSIS — J449 Chronic obstructive pulmonary disease, unspecified: Secondary | ICD-10-CM | POA: Diagnosis not present

## 2021-01-07 DIAGNOSIS — I5032 Chronic diastolic (congestive) heart failure: Secondary | ICD-10-CM | POA: Diagnosis not present

## 2021-01-07 DIAGNOSIS — M545 Low back pain, unspecified: Secondary | ICD-10-CM | POA: Diagnosis not present

## 2021-01-07 DIAGNOSIS — R748 Abnormal levels of other serum enzymes: Secondary | ICD-10-CM | POA: Diagnosis not present

## 2021-01-09 DIAGNOSIS — I5021 Acute systolic (congestive) heart failure: Secondary | ICD-10-CM | POA: Diagnosis not present

## 2021-01-09 DIAGNOSIS — I10 Essential (primary) hypertension: Secondary | ICD-10-CM | POA: Diagnosis not present

## 2021-01-09 DIAGNOSIS — Z96641 Presence of right artificial hip joint: Secondary | ICD-10-CM | POA: Diagnosis not present

## 2021-01-09 DIAGNOSIS — J449 Chronic obstructive pulmonary disease, unspecified: Secondary | ICD-10-CM | POA: Diagnosis not present

## 2021-01-20 ENCOUNTER — Other Ambulatory Visit: Payer: Self-pay | Admitting: *Deleted

## 2021-01-20 ENCOUNTER — Encounter: Payer: Self-pay | Admitting: *Deleted

## 2021-01-20 NOTE — Patient Outreach (Signed)
[facility]) Care Management  01/20/2021  Hannah Mckee Aug 10, 1931 [id number]  Referral Received 3/21 Initial Outreach 3/22 Telephone Assessment-Successful-Enrolled CHF  RN spoke with pt today and introduced the [facility] program and services. Discussed some of pt's ongoing medical issues and management of care. Pt reports her COPD is controlled with use of her nebulizer (Anora) once monthly with no flare ups reported. HF discussed and pt educated accordingly on HF zones and verified pt is in the GREEN zone however some swelling to her LE but reports it's the shoes she is wearing. States she never has any swelling or HF symptoms. Verified pt aware of what to do if acute symptoms should occur by contacting Dr. Nelly Laurence office.   Pt states she has a history with her balance but has had no falls since last May and uses her cane at all times. Has two daughter that do most of her house work and outing for her supplies as she stays in the care with crossword puzzles to avoid getting sick from the pandemic. Pt is very solid with her daily routine of care and states she will only weigh weekly based upon her baseline weight of 100 lbs. Lives in an apartment with very friendly neighbors and no longer drives.    Plan of care discussed in detail along with all the goals and interventions. Will notify pt's provider of pt's disposition with [facility] services and follow up next month as pt has agreed to monthly follow up calls and does not wish to received a lot of calls from [facility] but will to have a conversation with this RN case manager. Strongly encouraged pt to follow the plan discussed and RN will call next month as discussed on pt's progress. Answer all questions and inquires with no additional needs presented.   Goals Addressed            This Visit's Progress   . [place] My Quality of Life       Follow Up Date 02/20/2021 Timeframe:  Long-Range Goal Priority:  Medium Start Date:  01/20/2021                          Expected End Date:     04/30/2021                  - do one enjoyable thing every day - do something different, like talking to a new person or going to a new place, every day - spend time with a child every day, borrow one if I have to - spend time outdoors at least 3 times a week    Why is this important?    Having a long-term illness can be scary.   It can also be stressful for you and your caregiver.   These steps may help.    Notes:  3/22-Pt has generated a AD and named her daughter as proxy. Pt also indicates she stays in the car with traveling to the store with her daughters for supplies and does crossword puzzles. Pt is very safe from the pandemic and continues to wear her mask with any outings. Pt is satisfied with her ongoing quality of life.     . Track and Manage Fluids and Swelling-Heart Failure       Follow up Date; 02/20/2021 Timeframe:  Short-Term Goal Priority:  Medium Start Date:  01/20/2021  Expected End Date: 02/27/2021                   - call office if I gain more than 2 pounds in one day or 5 pounds in one week - do ankle pumps when sitting - keep legs up while sitting - track weight in diary - use salt in moderation - watch for swelling in feet, ankles and legs every day    Why is this important?    It is important to check your weight daily and watch how much salt and liquids you have.   It will help you to manage your heart failure.    Notes:  3/22-Pt reports she will only weigh weekly due to her ongoing weight at 99-101.1 lbs. Educated on HF and the importance of avoiding fluid retention and/or other participating symptoms. Pt aware of the zones and if she gains 3 lbs overnight or 5 lbs within one week with any symptoms by contacting Dr. Harl Bowie (CAD) provider's office. Offered to send a [facility] calender and encouraged pt to document all her information weights/BP ect in the calender for her providers to view.  Also discuss low sodium diet and healthy food items.    . Track and Manage Symptoms-Heart Failure       Follow Up Date 02/20/2021 Timeframe:  Long-term Priority:  Medium Start Date:   01/20/2021                          Expected End Date:     04/30/2021                  - begin a heart failure diary - develop a rescue plan - eat more whole grains, fruits and vegetables, lean meats and healthy fats - follow rescue plan if symptoms flare-up - know when to call the doctor - track symptoms and what helps feel better or worse - dress right for the weather, hot or cold    Why is this important?    You will be able to handle your symptoms better if you keep track of them.   Making some simple changes to your lifestyle will help.   Eating healthy is one thing you can do to take good care of yourself.    Notes:  3/22-Discussed the importance of fluid retention and what to do if acute. Pt has agreed to weekly weights and remains ware if she gains or retains any fluid. Pt's weighs around 100 lbs and has never has issues related to her HF symptoms. Confirms pt is in the GREEN zone.       Raina Mina, RN Care Management Coordinator [place] Office [phone]

## 2021-01-23 ENCOUNTER — Telehealth: Payer: Self-pay | Admitting: Cardiology

## 2021-01-23 NOTE — Telephone Encounter (Signed)
Pt c/o palpitations and feels like her heart is skipping for the last 45 mins - BP 170/63 HR 98 - denies SOB/dizziness/chest pain/swelling - BP 148/62 HR 90 - denies any medication changes - has already taken 50 mg of Toprol XL today and will take another 50 mg before bedtime - pt aware to continue to monitor symptoms and BP/HR - says she is nervous and will take Xanax as prescribed to help calm her down - aware if symptoms worsen to have ED evaluation - pt will update [place] on Monday on symptoms

## 2021-01-23 NOTE — Telephone Encounter (Signed)
Patient c/o Palpitations:  High priority if patient c/o lightheadedness, shortness of breath, or chest pain  1) How long have you had palpitations/irregular HR/ Afib? Are you having the symptoms now?     ABOUT 30 MINS AGO  2) Are you currently experiencing lightheadedness, SOB or CP? NO  Do you have a history of afib (atrial fibrillation) or irregular heart rhythm? YES  3) Have you checked your BP or HR? (document readings if available):   4) NO  5) Are you experiencing any other symptoms? NO

## 2021-01-28 DIAGNOSIS — R531 Weakness: Secondary | ICD-10-CM | POA: Diagnosis not present

## 2021-01-28 DIAGNOSIS — N3001 Acute cystitis with hematuria: Secondary | ICD-10-CM | POA: Diagnosis not present

## 2021-01-28 DIAGNOSIS — R42 Dizziness and giddiness: Secondary | ICD-10-CM | POA: Diagnosis not present

## 2021-01-28 DIAGNOSIS — Z0000 Encounter for general adult medical examination without abnormal findings: Secondary | ICD-10-CM | POA: Diagnosis not present

## 2021-01-28 DIAGNOSIS — I5032 Chronic diastolic (congestive) heart failure: Secondary | ICD-10-CM | POA: Diagnosis not present

## 2021-01-28 DIAGNOSIS — R748 Abnormal levels of other serum enzymes: Secondary | ICD-10-CM | POA: Diagnosis not present

## 2021-01-28 DIAGNOSIS — Z712 Person consulting for explanation of examination or test findings: Secondary | ICD-10-CM | POA: Diagnosis not present

## 2021-01-28 DIAGNOSIS — E860 Dehydration: Secondary | ICD-10-CM | POA: Diagnosis not present

## 2021-01-28 DIAGNOSIS — Z0189 Encounter for other specified special examinations: Secondary | ICD-10-CM | POA: Diagnosis not present

## 2021-01-28 NOTE — Telephone Encounter (Signed)
F/u on pt who says since 3/26 she hasn't had anymore symptoms - did go to pcp for passing blood in urine and was given antibiotics - pt aware if cardiac symptoms return to contact us back

## 2021-01-29 ENCOUNTER — Other Ambulatory Visit: Payer: Self-pay | Admitting: Cardiology

## 2021-02-09 DIAGNOSIS — I5021 Acute systolic (congestive) heart failure: Secondary | ICD-10-CM | POA: Diagnosis not present

## 2021-02-09 DIAGNOSIS — I10 Essential (primary) hypertension: Secondary | ICD-10-CM | POA: Diagnosis not present

## 2021-02-09 DIAGNOSIS — Z96641 Presence of right artificial hip joint: Secondary | ICD-10-CM | POA: Diagnosis not present

## 2021-02-09 DIAGNOSIS — J449 Chronic obstructive pulmonary disease, unspecified: Secondary | ICD-10-CM | POA: Diagnosis not present

## 2021-02-17 ENCOUNTER — Other Ambulatory Visit: Payer: Self-pay | Admitting: *Deleted

## 2021-02-17 NOTE — Patient Outreach (Signed)
[facility]) Care Management  02/17/2021  Hannah Mckee 1930/12/21 [id number]  Telephone Assessment-Successful  Spoke with pt today and received an update on her ongoing management of care. Pt reports her weight at 102.2 lbs on average due to only weight once weekly as agreed. Pt states she has again permission to take an extra fluid medication when needed for her ongoing swelling. Pt has a history to her right LE with several injuries that will always result in some swelling more then her left extremity. Pt denies any other issues and starts in the AM upon awakening with no swelling in there GREEN zone. Will review and discuss the currently plan of care and verified pt remains adherent with all her goals and interventions as discussed today. Updates related within the care plan on goals.   Will continue to encourage adherence and follow up next month with ongoing case management services.  Goals Addressed            This Visit's Progress   . THN-Matintain My Quality of Life   On track    Follow Up Date 03/23/2021 Timeframe:  Long-Range Goal Priority:  Medium Start Date:  01/20/2021                         Expected End Date:     04/30/2021                  - do one enjoyable thing every day - do something different, like talking to a new person or going to a new place, every day - spend time with a child every day, borrow one if I have to - spend time outdoors at least 3 times a week    Why is this important?    Having a long-term illness can be scary.   It can also be stressful for you and your caregiver.   These steps may help.    Notes:  3/22-Pt has generated a AD and named her daughter as proxy. Pt also indicates she stays in the car with traveling to the store with her daughters for supplies and does crossword puzzles. Pt is very safe from the pandemic and continues to wear her mask with any outings. Pt is satisfied with her ongoing quality of life.     .  THN-Track and Manage Fluids and Swelling-Heart Failure   On track    Follow up Date; 03/23/2021 Timeframe:  Short-Term Goal Priority:  Medium Start Date:  01/20/2021                           Expected End Date: 03/31/2021                   - call office if I gain more than 2 pounds in one day or 5 pounds in one week - do ankle pumps when sitting - keep legs up while sitting - track weight in diary - use salt in moderation - watch for swelling in feet, ankles and legs every day    Why is this important?    It is important to check your weight daily and watch how much salt and liquids you have.   It will help you to manage your heart failure.    Notes:  4/19 Pt reports history of some swelling and has regimen to follow via Dr. Harl Bowie to take extra fluid pill based upon  her swelling. Pt just starting weighing but will complete this weekly. Pt is very aware on when to contact her provider with related symptoms. Will continue to encourage adherence.  3/22-Pt reports she will only weigh weekly due to her ongoing weight at 99-101.1 lbs. Educated on HF and the importance of avoiding fluid retention and/or other participating symptoms. Pt aware of the zones and if she gains 3 lbs overnight or 5 lbs within one week with any symptoms by contacting Dr. Harl Bowie (CAD) provider's office. Offered to send a [facility] calender and encouraged pt to document all her information weights/BP ect in the calender for her providers to view. Also discuss low sodium diet and healthy food items.    . THN-Track and Manage Symptoms-Heart Failure   On track    Follow Up Date 03/23/2021 Timeframe:  Long-term Priority:  Medium Start Date:   01/20/2021                          Expected End Date:     04/30/2021                  - begin a heart failure diary - develop a rescue plan - eat more whole grains, fruits and vegetables, lean meats and healthy fats - follow rescue plan if symptoms flare-up - know when to call the doctor -  track symptoms and what helps feel better or worse - dress right for the weather, hot or cold    Why is this important?    You will be able to handle your symptoms better if you keep track of them.   Making some simple changes to your lifestyle will help.   Eating healthy is one thing you can do to take good care of yourself.    Notes:  3/22-Discussed the importance of fluid retention and what to do if acute. Pt has agreed to weekly weights and remains ware if she gains or retains any fluid. Pt's weighs around 100 lbs and has never has issues related to her HF symptoms. Confirms pt is in the GREEN zone.       Raina Mina, RN Care Management Coordinator [place] Office [phone]

## 2021-02-20 ENCOUNTER — Ambulatory Visit: Payer: Self-pay | Admitting: *Deleted

## 2021-03-11 DIAGNOSIS — I10 Essential (primary) hypertension: Secondary | ICD-10-CM | POA: Diagnosis not present

## 2021-03-11 DIAGNOSIS — Z96641 Presence of right artificial hip joint: Secondary | ICD-10-CM | POA: Diagnosis not present

## 2021-03-11 DIAGNOSIS — I5021 Acute systolic (congestive) heart failure: Secondary | ICD-10-CM | POA: Diagnosis not present

## 2021-03-11 DIAGNOSIS — J449 Chronic obstructive pulmonary disease, unspecified: Secondary | ICD-10-CM | POA: Diagnosis not present

## 2021-03-23 ENCOUNTER — Other Ambulatory Visit: Payer: Self-pay | Admitting: *Deleted

## 2021-03-23 NOTE — Patient Outreach (Signed)
Hannah Mckee) Care Management  03/23/2021  Hannah Mckee 15-Aug-1931 [id number]   Telephone Assessment-Successful-HF  RN spoke with pt today and received an update on her ongoing management of care. Pt states she remains at her baseline weight 101 lbs over the last 3 weeks with no acute symptoms. Pt states she has had some outings shopping and spending time over to her daughter home very relaxing again enjoying her quality of life. No acute concerns at this time as pt continue to manage her ongoing medical conditional with no acute issues.  Review and discussed her ongoing plan of care with noted updates. Will reiterated on any goals and interventions and follow up next month with pt's ongoing progress and update her provider at that time with a quarterly overview. No other needs to address at this time. Will schedule next follow up in one month.  Goals Addressed            This Visit's Progress   . THN-Matintain My Quality of Life   On track    Follow Up Date 04/23/2021 Timeframe:  Long-Range Goal Priority:  Medium Start Date:  01/20/2021                         Expected End Date:     04/30/2021                  - do one enjoyable thing every day - do something different, like talking to a new person or going to a new place, every day - spend time with a child every day, borrow one if I have to - spend time outdoors at least 3 times a week   Barriers: Health Behaviors  Why is this important?    Having a long-term illness can be scary.   It can also be stressful for you and your caregiver.   These steps may help.    Notes:  3/22-Pt has generated a AD and named her daughter as proxy. Pt also indicates she stays in the car with traveling to the store with her daughters for supplies and does crossword puzzles. Pt is very safe from the pandemic and continues to wear her mask with any outings. Pt is satisfied with her ongoing quality of life.     . THN-Track and  Manage Fluids and Swelling-Heart Failure   On track    Follow up Date; 04/23/2021 Timeframe:  Short-Term Goal Priority:  Medium Start Date:  01/20/2021                           Expected End Date: 04/30/2021                   - call office if I gain more than 2 pounds in one day or 5 pounds in one week - do ankle pumps when sitting - keep legs up while sitting - track weight in diary - use salt in moderation - watch for swelling in feet, ankles and legs every day   Barriers: Health Behaviors  Why is this important?    It is important to check your weight daily and watch how much salt and liquids you have.   It will help you to manage your heart failure.    Notes:  5/23-Pt confirms ongoing management of care related to her HF. Pt denies any swelling today with her normal baseline weights over the  last 3 weeks. No changes or fluid retention. Will continue to encourage evaluation of her LE with any staging of swelling.  4/19 Pt reports history of some swelling and has regimen to follow via Dr. Harl Bowie to take extra fluid pill based upon her swelling. Pt just starting weighing but will complete this weekly. Pt is very aware on when to contact her provider with related symptoms. Will continue to encourage adherence.  3/22-Pt reports she will only weigh weekly due to her ongoing weight at 99-101.1 lbs. Educated on HF and the importance of avoiding fluid retention and/or other participating symptoms. Pt aware of the zones and if she gains 3 lbs overnight or 5 lbs within one week with any symptoms by contacting Dr. Harl Bowie (CAD) provider's office. Offered to send a [facility] calender and encouraged pt to document all her information weights/BP ect in the calender for her providers to view. Also discuss low sodium diet and healthy food items.    . THN-Track and Manage Symptoms-Heart Failure   On track    Follow Up Date 04/23/2021 Timeframe:  Long-term Priority:  Medium Start Date:   01/20/2021                           Expected End Date:     04/30/2021                  - begin a heart failure diary - develop a rescue plan - eat more whole grains, fruits and vegetables, lean meats and healthy fats - follow rescue plan if symptoms flare-up - know when to call the doctor - track symptoms and what helps feel better or worse - dress right for the weather, hot or cold   Barriers: Health Behaviors  Why is this important?    You will be able to handle your symptoms better if you keep track of them.   Making some simple changes to your lifestyle will help.   Eating healthy is one thing you can do to take good care of yourself.    Notes:  3/22-Discussed the importance of fluid retention and what to do if acute. Pt has agreed to weekly weights and remains ware if she gains or retains any fluid. Pt's weighs around 100 lbs and has never has issues related to her HF symptoms. Confirms pt is in the GREEN zone.       Raina Mina, RN Care Management Coordinator [place] Office [phone]

## 2021-04-09 DIAGNOSIS — I10 Essential (primary) hypertension: Secondary | ICD-10-CM | POA: Diagnosis not present

## 2021-04-09 DIAGNOSIS — Z1159 Encounter for screening for other viral diseases: Secondary | ICD-10-CM | POA: Diagnosis not present

## 2021-04-09 DIAGNOSIS — D509 Iron deficiency anemia, unspecified: Secondary | ICD-10-CM | POA: Diagnosis not present

## 2021-04-11 DIAGNOSIS — I5021 Acute systolic (congestive) heart failure: Secondary | ICD-10-CM | POA: Diagnosis not present

## 2021-04-11 DIAGNOSIS — I10 Essential (primary) hypertension: Secondary | ICD-10-CM | POA: Diagnosis not present

## 2021-04-11 DIAGNOSIS — J449 Chronic obstructive pulmonary disease, unspecified: Secondary | ICD-10-CM | POA: Diagnosis not present

## 2021-04-11 DIAGNOSIS — Z96641 Presence of right artificial hip joint: Secondary | ICD-10-CM | POA: Diagnosis not present

## 2021-04-15 DIAGNOSIS — J449 Chronic obstructive pulmonary disease, unspecified: Secondary | ICD-10-CM | POA: Diagnosis not present

## 2021-04-15 DIAGNOSIS — K219 Gastro-esophageal reflux disease without esophagitis: Secondary | ICD-10-CM | POA: Diagnosis not present

## 2021-04-15 DIAGNOSIS — F5101 Primary insomnia: Secondary | ICD-10-CM | POA: Diagnosis not present

## 2021-04-15 DIAGNOSIS — D509 Iron deficiency anemia, unspecified: Secondary | ICD-10-CM | POA: Diagnosis not present

## 2021-04-15 DIAGNOSIS — I2510 Atherosclerotic heart disease of native coronary artery without angina pectoris: Secondary | ICD-10-CM | POA: Diagnosis not present

## 2021-04-15 DIAGNOSIS — M450 Ankylosing spondylitis of multiple sites in spine: Secondary | ICD-10-CM | POA: Diagnosis not present

## 2021-04-15 DIAGNOSIS — I5032 Chronic diastolic (congestive) heart failure: Secondary | ICD-10-CM | POA: Diagnosis not present

## 2021-04-23 ENCOUNTER — Other Ambulatory Visit: Payer: Self-pay | Admitting: *Deleted

## 2021-04-23 NOTE — Patient Outreach (Addendum)
[place] [facility]) Care Management  04/23/2021  Hannah Mckee November 24, 1930 [id number]   Telephone Assessment-Successful-HF  Spoke with pt today and received an update on pt's ongoing management of care related to her HF. Pt denies any fluid retention and continue to be in the GREEN zone with acute symptoms or reported problems.  Will update provider on pt's ongoing disposition with [facility] and follow up with pt next month with her ongoing management of care.  Plan reviewed and discussed as noted in the plan of care.   Goals Addressed             This Visit's Progress    COMPLETED: THN-Matintain My Quality of Life   On track    Follow Up Date 04/23/2021 Timeframe:  Long-Range Goal Priority:  Medium Start Date:  01/20/2021                         Expected End Date:     04/30/2021                  - do one enjoyable thing every day - do something different, like talking to a new person or going to a new place, every day - spend time with a child every day, borrow one if I have to - spend time outdoors at least 3 times a week   Barriers: Health Behaviors  Why is this important?   Having a long-term illness can be scary.  It can also be stressful for you and your caregiver.  These steps may help.    Notes:  3/22-Pt has generated a AD and named her daughter as proxy. Pt also indicates she stays in the car with traveling to the store with her daughters for supplies and does crossword puzzles. Pt is very safe from the pandemic and continues to wear her mask with any outings. Pt is satisfied with her ongoing quality of life.       COMPLETED: THN-Track and Manage Fluids and Swelling-Heart Failure   On track    Follow up Date; 04/23/2021 Timeframe:  Short-Term Goal Priority:  Medium Start Date:  01/20/2021                           Expected End Date: 04/30/2021                   - call office if I gain more than 2 pounds in one day or 5 pounds in one week - do ankle pumps when  sitting - keep legs up while sitting - track weight in diary - use salt in moderation - watch for swelling in feet, ankles and legs every day   Barriers: Health Behaviors  Why is this important?   It is important to check your weight daily and watch how much salt and liquids you have.  It will help you to manage your heart failure.    Notes:  5/23-Pt confirms ongoing management of care related to her HF. Pt denies any swelling today with her normal baseline weights over the last 3 weeks. No changes or fluid retention. Will continue to encourage evaluation of her LE with any staging of swelling.  4/19 Pt reports history of some swelling and has regimen to follow via Dr. Harl Bowie to take extra fluid pill based upon her swelling. Pt just starting weighing but will complete this weekly. Pt is very aware on  when to contact her provider with related symptoms. Will continue to encourage adherence.  3/22-Pt reports she will only weigh weekly due to her ongoing weight at 99-101.1 lbs. Educated on HF and the importance of avoiding fluid retention and/or other participating symptoms. Pt aware of the zones and if she gains 3 lbs overnight or 5 lbs within one week with any symptoms by contacting Dr. Harl Bowie (CAD) provider's office. Offered to send a [facility] calender and encouraged pt to document all her information weights/BP ect in the calender for her providers to view. Also discuss low sodium diet and healthy food items.      THN-Track and Manage Symptoms-Heart Failure   On track    Follow Up Date 05/22/2021 Timeframe:  Long-term Priority:  Medium Start Date:   01/20/2021                          Expected End Date:     07/31/2021                  - begin a heart failure diary - develop a rescue plan - eat more whole grains, fruits and vegetables, lean meats and healthy fats - follow rescue plan if symptoms flare-up - know when to call the doctor - track symptoms and what helps feel better or worse - dress  right for the weather, hot or cold   Barriers: Health Behaviors  Why is this important?   You will be able to handle your symptoms better if you keep track of them.  Making some simple changes to your lifestyle will help.  Eating healthy is one thing you can do to take good care of yourself.    Notes:  6/23; Pt continue to do well with all goals and interventions as noted above. No flare up or fluid retention.  3/22-Discussed the importance of fluid retention and what to do if acute. Pt has agreed to weekly weights and remains ware if she gains or retains any fluid. Pt's weighs around 100 lbs and has never has issues related to her HF symptoms. Confirms pt is in the GREEN zone.          Raina Mina, RN Care Management Coordinator [place] Office [phone]

## 2021-05-11 DIAGNOSIS — I5021 Acute systolic (congestive) heart failure: Secondary | ICD-10-CM | POA: Diagnosis not present

## 2021-05-11 DIAGNOSIS — I10 Essential (primary) hypertension: Secondary | ICD-10-CM | POA: Diagnosis not present

## 2021-05-11 DIAGNOSIS — Z96641 Presence of right artificial hip joint: Secondary | ICD-10-CM | POA: Diagnosis not present

## 2021-05-11 DIAGNOSIS — J449 Chronic obstructive pulmonary disease, unspecified: Secondary | ICD-10-CM | POA: Diagnosis not present

## 2021-05-22 ENCOUNTER — Other Ambulatory Visit: Payer: Self-pay | Admitting: *Deleted

## 2021-05-22 NOTE — Patient Outreach (Signed)
[facility] [facility]) Care Management  05/22/2021  Hannah Mckee [birth year] [id number]   Telephone Assessment-Successful HF  RN spoke with pt today and received an update on this plan of care which  was reviewed and discussed. Pt continue to progress in managing her ongoing care with daily weights under 100 lbs. Pt at a facility and continues to be very caution amongst the other  residence at the facility. Pt denies any symptoms and the care plan  has been updated accordingly to reflect her ongoing management of care.  Will follow up next month and continue to communicate with her provider.   Goals Addressed             This Visit's Progress    THN-Track and Manage Symptoms-Heart Failure   On track    Follow Up Date 06/18/2021 Timeframe:  Long-term Priority:  Medium Start Date:   01/20/2021                          Expected End Date:     07/31/2021                  - begin a heart failure diary - develop a rescue plan - eat more whole grains, fruits and vegetables, lean meats and healthy fats - follow rescue plan if symptoms flare-up - know when to call the doctor - track symptoms and what helps feel better or worse - dress right for the weather, hot or cold   Barriers: Health Behaviors  Why is this important?   You will be able to handle your symptoms better if you keep track of them.  Making some simple changes to your lifestyle will help.  Eating healthy is one thing you can do to take good care of yourself.    Notes:  6/23; Pt continue to do well with all goals and interventions as noted above. No flare up or fluid retention.  3/22-Discussed the importance of fluid retention and what to do if acute. Pt has agreed to weekly weights and remains ware if she gains or retains any fluid. Pt's weighs around 100 lbs and has never has issues related to her HF symptoms. Confirms pt is in the GREEN zone.         Raina Mina, RN Care Management Coordinator [place] Office [phone]

## 2021-06-07 ENCOUNTER — Other Ambulatory Visit: Payer: Self-pay | Admitting: Cardiology

## 2021-06-11 ENCOUNTER — Ambulatory Visit: Payer: Medicare Other | Admitting: Cardiology

## 2021-06-11 ENCOUNTER — Encounter: Payer: Self-pay | Admitting: Cardiology

## 2021-06-11 VITALS — BP 154/70 | HR 92 | Ht <= 58 in | Wt 97.4 lb

## 2021-06-11 DIAGNOSIS — I5032 Chronic diastolic (congestive) heart failure: Secondary | ICD-10-CM

## 2021-06-11 DIAGNOSIS — I10 Essential (primary) hypertension: Secondary | ICD-10-CM

## 2021-06-11 DIAGNOSIS — I5021 Acute systolic (congestive) heart failure: Secondary | ICD-10-CM | POA: Diagnosis not present

## 2021-06-11 DIAGNOSIS — Z96641 Presence of right artificial hip joint: Secondary | ICD-10-CM | POA: Diagnosis not present

## 2021-06-11 DIAGNOSIS — J449 Chronic obstructive pulmonary disease, unspecified: Secondary | ICD-10-CM | POA: Diagnosis not present

## 2021-06-11 NOTE — Progress Notes (Signed)
Clinical Summary Hannah Mckee is a 87 y.o.female seen today for follow up of the following medical problems.      1. Chronic diastolic HF - echo [id number] LVEF 35-40%. Family asked to defer ischemic testing at that time.  - f/u echo after medical therapy showed normalization of LVEF. 04/2017 echo LVEF 0000000, grade I diastolic dysfunction. Mild AI.  - lisionpril caused rash.       09/2018 LVEF 0000000, grade I diastolic dysfunction.   - home weights 112 lbs. LE edema is up and down.   - occasional swelling, improves with lasix.  - takes lasix '40mg'$  daily, will take additional '20mg'$  as needed -      2. SOB - history of both COPD, CHF.  - denies any significant recent SOB     3. COPD  - previously followed by Dr Luan Pulling     4. Ulcer - noted by 08/2020 EGD - now off ASA  5. Abnoraml stress test Jan 2020 nuclear stress: Findings consistent with prior inferior/inferoseptal/septal myocardial infarction with moderate peri-infarct ischemia - no recent symptoms  6. HTN - home 130s-150s/50-60s  Past Medical History:  Diagnosis Date   Acute blood loss anemia 08/27/2020   Cancer (HCC)    Skin, basal   CHF (congestive heart failure) ([place])    Complication of anesthesia 1950's   'Years ago difficult awaking up"   COPD (chronic obstructive pulmonary disease) ([place])    Coronary artery calcification seen on CT scan    Multivessel   Dyspnea    short of breath walking in home, breathing normal after sitting down   History of blood transfusion    History of cardiomyopathy    a. EF 35-40% by echo in 10/2016 b. improved to 55-60% by echo in 04/2017   History of kidney stones    History of shingles    Hypertension    Kyphosis    marked severity   Osteoarthritis    Pneumonia 10/04/2016   Salivary gland stone    Sepsis (HCC)      Allergies  Allergen Reactions   Lisinopril Hives   Erythromycin Swelling   Levaquin [Levofloxacin] Other (See Comments)    Tongue turns red and  sore   Penicillins Swelling    Has patient had a PCN reaction causing immediate rash, facial/tongue/throat swelling, SOB or lightheadedness with hypotension: Yes Has patient had a PCN reaction causing severe rash involving mucus membranes or skin necrosis: No Has patient had a PCN reaction that required hospitalization No Has patient had a PCN reaction occurring within the last 10 years: No If all of the above answers are "NO", then may proceed with Cephalosporin use.    Sulfa Antibiotics Other (See Comments)    Tongue turns red and sore     Current Outpatient Medications  Medication Sig Dispense Refill   acetaminophen (TYLENOL) 500 MG tablet Take 1,000 mg by mouth every 6 (six) hours as needed for moderate pain.     ALPRAZolam (XANAX) 0.25 MG tablet Take 0.025 mg by mouth at bedtime.     amLODipine (NORVASC) 5 MG tablet TAKE 1 TABLET BY MOUTH DAILY 90 tablet 2   Cholecalciferol (VITAMIN D-3) 125 MCG (5000 UT) TABS Take 1 capsule by mouth daily as needed. Takes 3 or 4 times a week     feeding supplement, ENSURE ENLIVE, (ENSURE ENLIVE) LIQD Take 237 mLs by mouth 2 (two) times daily between meals. (Patient taking differently: Take 118.5 mLs by mouth daily.)  237 mL 12   furosemide (LASIX) 40 MG tablet TAKE 1 AND 1/2 TABLETS BY MOUTH DAILY (MAY take an extra 1/2 tablet AS NEEDED FOR SEVERE swelling) 135 tablet 1   ipratropium-albuterol (DUONEB) 0.5-2.5 (3) MG/3ML SOLN Take 3 mLs by nebulization every 4 (four) hours as needed.     Iron-Vitamins (S.S.S. TONIC) LIQD Take by mouth daily. 3 tablespoons daily     metoprolol succinate (TOPROL-XL) 100 MG 24 hr tablet TAKE ONE-HALF TABLET BY  MOUTH IN THE MORNING AND AT BEDTIME , WITH OR  IMMEDIATELY FOLLOWING A  MEAL 90 tablet 3   nitroGLYCERIN (NITROSTAT) 0.4 MG SL tablet Place 1 tablet (0.4 mg total) under the tongue every 5 (five) minutes as needed for chest pain (CP or SOB). 25 tablet 3   pantoprazole (PROTONIX) 40 MG tablet Take 1 tablet (40 mg  total) by mouth daily. 30 tablet 0   potassium chloride SA (KLOR-CON) 20 MEQ tablet TAKE 1 TABLET BY MOUTH  DAILY . TAKE ONLY WHEN  FUROSEMIDE IS TAKEN. 90 tablet 3   umeclidinium-vilanterol (ANORO ELLIPTA) 62.5-25 MCG/INH AEPB Inhale 1 puff into the lungs daily.     No current facility-administered medications for this visit.     Past Surgical History:  Procedure Laterality Date   BIOPSY  08/28/2020   Procedure: BIOPSY;  Surgeon: Daneil Dolin, MD;  [facility];  Service: Endoscopy;;   BLADDER SURGERY     BUNIONECTOMY     CATARACT EXTRACTION Bilateral    CHOLECYSTECTOMY     COLONOSCOPY  11/16/2012   Procedure: COLONOSCOPY;  Surgeon: Rogene Houston, MD;  [facility];  Service: Endoscopy;  Laterality: N/A;  100   ESOPHAGOGASTRODUODENOSCOPY (EGD) WITH PROPOFOL N/A 08/28/2020   Procedure: ESOPHAGOGASTRODUODENOSCOPY (EGD) WITH PROPOFOL;  Surgeon: Daneil Dolin, MD;  [facility];  Service: Endoscopy;  Laterality: N/A;   LITHOTRIPSY     MASS EXCISION N/A 03/21/2020   Procedure: EXCISION ORAL MASS;  Surgeon: Leta Baptist, MD;  Location: [place];  Service: ENT;  Laterality: N/A;   ORIF PERIPROSTHETIC FRACTURE Right 10/29/2014   Procedure: OPEN REDUCTION INTERNAL FIXATION (ORIF) PERIPROSTHETIC FEMUR FRACTURE/FEMUR SHAFT;  Surgeon: Mcarthur Rossetti, MD;  Location: [place];  Service: Orthopedics;  Laterality: Right;   TONSILLECTOMY     TOTAL HIP ARTHROPLASTY     1989     Allergies  Allergen Reactions   Lisinopril Hives   Erythromycin Swelling   Levaquin [Levofloxacin] Other (See Comments)    Tongue turns red and sore   Penicillins Swelling    Has patient had a PCN reaction causing immediate rash, facial/tongue/throat swelling, SOB or lightheadedness with hypotension: Yes Has patient had a PCN reaction causing severe rash involving mucus membranes or skin necrosis: No Has patient had a PCN reaction that required hospitalization No Has patient had a  PCN reaction occurring within the last 10 years: No If all of the above answers are "NO", then may proceed with Cephalosporin use.    Sulfa Antibiotics Other (See Comments)    Tongue turns red and sore      Family History  Problem Relation Age of Onset   Hypertension Sister    Colon cancer Neg Hx      Social History Hannah Mckee reports that she quit smoking about 24 years ago. Her smoking use included cigarettes. She has a 17.50 pack-year smoking history. She has never used smokeless tobacco. Hannah Mckee reports no history of alcohol use.  Review of Systems CONSTITUTIONAL: No weight loss, fever, chills, weakness or fatigue.  HEENT: Eyes: No visual loss, blurred vision, double vision or yellow sclerae.No hearing loss, sneezing, congestion, runny nose or sore throat.  SKIN: No rash or itching.  CARDIOVASCULAR: per hpi RESPIRATORY: No shortness of breath, cough or sputum.  GASTROINTESTINAL: No anorexia, nausea, vomiting or diarrhea. No abdominal pain or blood.  GENITOURINARY: No burning on urination, no polyuria NEUROLOGICAL: No headache, dizziness, syncope, paralysis, ataxia, numbness or tingling in the extremities. No change in bowel or bladder control.  MUSCULOSKELETAL: No muscle, back pain, joint pain or stiffness.  LYMPHATICS: No enlarged nodes. No history of splenectomy.  PSYCHIATRIC: No history of depression or anxiety.  ENDOCRINOLOGIC: No reports of sweating, cold or heat intolerance. No polyuria or polydipsia.  Marland Kitchen   Physical Examination Today's Vitals   06/11/21 1142  BP: (!) 154/70  Pulse: 92  SpO2: 96%  Weight: 97 lb 6.4 oz (44.2 kg)  Height: '4\' 7"'$  (1.397 m)   Body mass index is 22.64 kg/m.  Gen: resting comfortably, no acute distress HEENT: no scleral icterus, pupils equal round and reactive, no palptable cervical adenopathy,  CV: RRR, no m/r/g no jvd Resp: Clear to auscultation bilaterally GI: abdomen is soft, non-tender, non-distended, normal bowel  sounds, no hepatosplenomegaly MSK: extremities are warm, no edema.  Skin: warm, no rash Neuro:  no focal deficits Psych: appropriate affect   Diagnostic Studies  Jan 2020 nuclear stress There was no ST segment deviation noted during stress. Findings consistent with prior inferior/inferoseptal/septal myocardial infarction with moderate peri-infarct ischemia. This is an intermediate risk study. The left ventricular ejection fraction is hyperdynamic (>65%   Assessment and Plan  1. Chronic diastolic HF -euvolemic today, doing well on lasix, will continue     2. HTN - manual recheck 148/50, wide age related pulse pressure with low normal DBP. Would not be more aggressive with bp meds  F/u 6 months    Arnoldo Lenis, M.D.

## 2021-06-11 NOTE — Patient Instructions (Addendum)
Medication Instructions:  Continue all current medications.   Labwork: none  Testing/Procedures: none  Follow-Up: 6 months   Any Other Special Instructions Will Be Listed Below (If Applicable).   If you need a refill on your cardiac medications before your next appointment, please call your pharmacy.  

## 2021-06-18 ENCOUNTER — Other Ambulatory Visit: Payer: Self-pay | Admitting: *Deleted

## 2021-06-18 NOTE — Patient Outreach (Signed)
[facility]) Care Management  06/18/2021  Hannah Mckee Mar 02, 1931 [id number]   Telephone Assessment-Successful-HF  RN spoke with pt today who continues to manage her H with no acute issues or needs. Pt reports her ongoing weights between 96-98 lbs and she continues to have permission to take an extra "fluid pill" when needed for any additional swelling with a parameter. Pt states her swelling occurs later in the day but resolved overnight when she arrives in the morning.  Pt will have her medications delivered today and continue to confirm support from her daughter who provides ongoing transportation services.Plan of care updated and discussed with goals and interventions. No additional needs or concerns at this time.  Will follow up next month with ongoing case management services.   Goals Addressed             This Visit's Progress    THN-Track and Manage Symptoms-Heart Failure   On track    Follow Up Date: 07/16/2021 Timeframe:  Long-term Priority:  Medium Start Date:   01/20/2021                          Expected End Date:     07/31/2021                  - begin a heart failure diary - develop a rescue plan - eat more whole grains, fruits and vegetables, lean meats and healthy fats - follow rescue plan if symptoms flare-up - know when to call the doctor - track symptoms and what helps feel better or worse - dress right for the weather, hot or cold   Barriers: Health Behaviors  Why is this important?   You will be able to handle your symptoms better if you keep track of them.  Making some simple changes to your lifestyle will help.  Eating healthy is one thing you can do to take good care of yourself.    Notes:  6/23; Pt continue to do well with all goals and interventions as noted above. No flare up or fluid retention.  3/22-Discussed the importance of fluid retention and what to do if acute. Pt has agreed to weekly weights and remains ware if she gains  or retains any fluid. Pt's weighs around 100 lbs and has never has issues related to her HF symptoms. Confirms pt is in the GREEN zone.         Raina Mina, RN Care Management Coordinator [place] Office [phone]

## 2021-07-10 DIAGNOSIS — I5032 Chronic diastolic (congestive) heart failure: Secondary | ICD-10-CM | POA: Diagnosis not present

## 2021-07-10 DIAGNOSIS — D509 Iron deficiency anemia, unspecified: Secondary | ICD-10-CM | POA: Diagnosis not present

## 2021-07-12 DIAGNOSIS — I10 Essential (primary) hypertension: Secondary | ICD-10-CM | POA: Diagnosis not present

## 2021-07-12 DIAGNOSIS — J449 Chronic obstructive pulmonary disease, unspecified: Secondary | ICD-10-CM | POA: Diagnosis not present

## 2021-07-12 DIAGNOSIS — Z96641 Presence of right artificial hip joint: Secondary | ICD-10-CM | POA: Diagnosis not present

## 2021-07-12 DIAGNOSIS — I5021 Acute systolic (congestive) heart failure: Secondary | ICD-10-CM | POA: Diagnosis not present

## 2021-07-15 ENCOUNTER — Other Ambulatory Visit: Payer: Self-pay | Admitting: *Deleted

## 2021-07-15 DIAGNOSIS — M450 Ankylosing spondylitis of multiple sites in spine: Secondary | ICD-10-CM | POA: Diagnosis not present

## 2021-07-15 DIAGNOSIS — R634 Abnormal weight loss: Secondary | ICD-10-CM | POA: Diagnosis not present

## 2021-07-15 DIAGNOSIS — I5032 Chronic diastolic (congestive) heart failure: Secondary | ICD-10-CM | POA: Diagnosis not present

## 2021-07-15 DIAGNOSIS — I2510 Atherosclerotic heart disease of native coronary artery without angina pectoris: Secondary | ICD-10-CM | POA: Diagnosis not present

## 2021-07-15 DIAGNOSIS — Z0001 Encounter for general adult medical examination with abnormal findings: Secondary | ICD-10-CM | POA: Diagnosis not present

## 2021-07-15 DIAGNOSIS — D509 Iron deficiency anemia, unspecified: Secondary | ICD-10-CM | POA: Diagnosis not present

## 2021-07-15 DIAGNOSIS — F5101 Primary insomnia: Secondary | ICD-10-CM | POA: Diagnosis not present

## 2021-07-15 DIAGNOSIS — J449 Chronic obstructive pulmonary disease, unspecified: Secondary | ICD-10-CM | POA: Diagnosis not present

## 2021-07-15 DIAGNOSIS — R7301 Impaired fasting glucose: Secondary | ICD-10-CM | POA: Diagnosis not present

## 2021-07-15 DIAGNOSIS — K219 Gastro-esophageal reflux disease without esophagitis: Secondary | ICD-10-CM | POA: Diagnosis not present

## 2021-07-15 NOTE — Patient Outreach (Signed)
[facility]) Care Management  07/15/2021  DARLISA WALKENHORST [birth year] [id number]  Telephone Assessment-Unsuccessful  RN attempted outreach call however unsuccessful with no answer and RN unable to leave a message.  Will attempt another outreach call over the next week for ongoing [facility] services.  Raina Mina, RN Care Management Coordinator [place] Office [phone]

## 2021-07-20 ENCOUNTER — Telehealth: Payer: Self-pay | Admitting: Cardiology

## 2021-07-20 NOTE — Telephone Encounter (Signed)
I need to check with Dr.Branch that her prescribed oxygen. Patient told front desk that he did 5 years ago.

## 2021-07-20 NOTE — Telephone Encounter (Signed)
I spoke with daughter and they will speak with Dr.Wert and PCP.

## 2021-07-20 NOTE — Telephone Encounter (Signed)
New message    Patient needs a letter saying that she does not need her oxygen machine, her O2 sat is staying in the 90's , her machine has stopped working and they (the company she gets her 02 through) wants an order to replace it, but the patient does not feel she needs it. The medical company providing the oxygen said that they need a letter from her provider stating that she no longer needs the oxygen before they will discontinue it.

## 2021-07-20 NOTE — Telephone Encounter (Signed)
She has seen pulmonary Dr Melvyn Novas in the past, typically they would manage any home O2 and any need to renew or cancel it  Zandra Abts MD

## 2021-07-21 ENCOUNTER — Other Ambulatory Visit: Payer: Self-pay | Admitting: *Deleted

## 2021-07-21 NOTE — Patient Outreach (Signed)
[facility]) Care Management  07/21/2021  Hannah Mckee [birth year] [id number]   Telephone Assessment-Unsuccessful  RN attempted outreach call today however unsuccessful. RN unable to leave a HIPAA message however sent outreach letter requesting a call.   Will attempt another outreach call over the next few weeks for ongoing [facility] services.  Raina Mina, RN Care Management Coordinator [place] Office [phone]

## 2021-08-20 ENCOUNTER — Other Ambulatory Visit: Payer: Self-pay | Admitting: *Deleted

## 2021-08-20 NOTE — Patient Outreach (Signed)
[facility]) Care Management  08/20/2021  Hannah Mckee [birth year] [id number]   Telephone Assessment-Unsuccessful  RN attempted outreach today however unsuccessful. RN was not able to left a voice message at this time. Will continue outreaches over the next month for continuous [facility] services.   Raina Mina, RN Care Management Coordinator [place] Office [phone]

## 2021-08-28 ENCOUNTER — Other Ambulatory Visit: Payer: Self-pay | Admitting: *Deleted

## 2021-08-28 MED ORDER — NITROGLYCERIN 0.4 MG SL SUBL: 0.4000 mg | SUBLINGUAL_TABLET | SUBLINGUAL | 3 refills | Status: DC | PRN

## 2021-09-18 ENCOUNTER — Other Ambulatory Visit: Payer: Self-pay | Admitting: *Deleted

## 2021-09-18 NOTE — Patient Outreach (Signed)
[facility]) Care Management  09/18/2021  TALISSA APPLE 05-Apr-1931 [id number]  Case Closure External Case management program with provider  Spoke with pt today and this information and informed pt that [facility] services would be ending due to case management with her provider. Pt remains in the GREEN zone with her ongoing management of care and continue to do well. Reports new medications with some initial symptoms that have resolved with no additional symptoms reported. Pt ware to contact her provider with persistent symptoms that made worse if encountered.  Pt very grateful for [facility] services with no additional needs at this time. Case will be closed.  Raina Mina, RN Care Management Coordinator [place] Office [phone]

## 2021-10-28 ENCOUNTER — Other Ambulatory Visit: Payer: Self-pay | Admitting: Cardiology

## 2021-11-01 IMAGING — CT CT NECK W/ CM
3 of 4 series · 12 of 33 positions shown, 14 images · IV contrast (Omnipaque or Isovue)
Comparison: 03/05/2017 CT neck.

CLINICAL DATA: Sublingual cyst

EXAM:
CT NECK WITH CONTRAST
TECHNIQUE: Multidetector CT imaging of the neck was performed using the
standard protocol following the bolus administration of intravenous
contrast.
CONTRAST:  60mL OMNIPAQUE IOHEXOL 300 MG/ML  SOLN

[Series 4: cor neck · coronal · 0.40mm/px · 3 of 139 slices shown]
[im 46/139  bone]
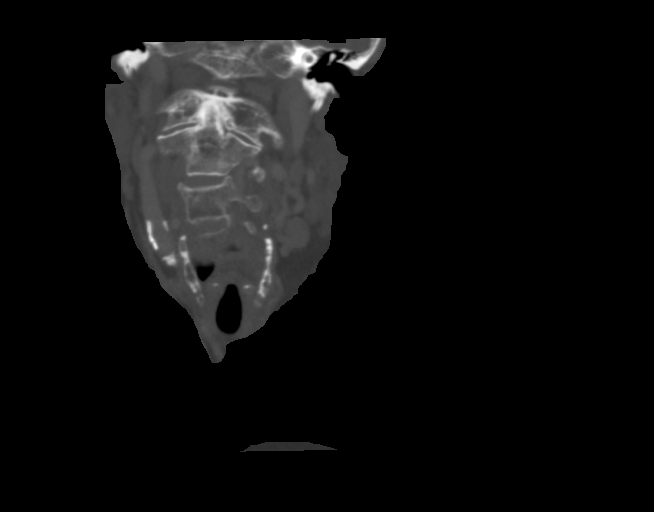
[im 62/139  bone]
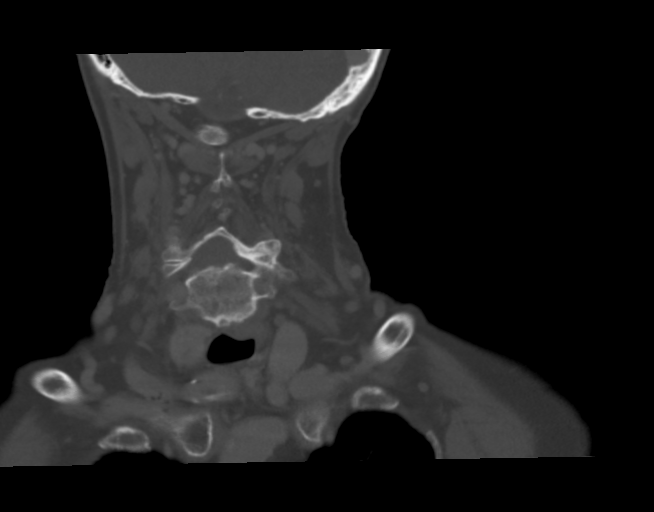
[im 77/139  bone]
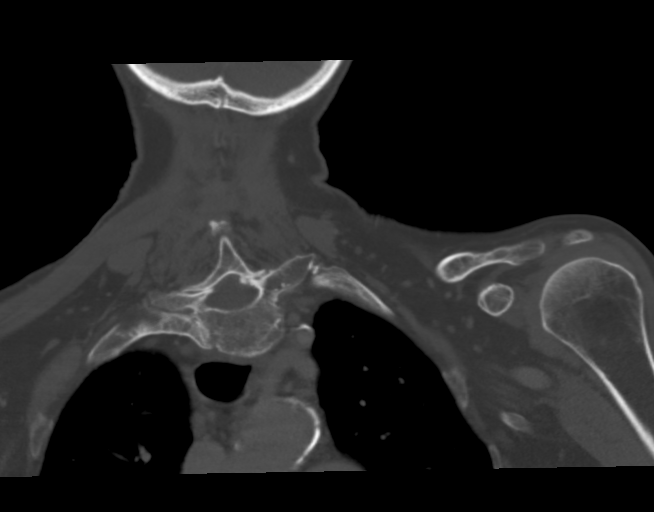

[Series 5: sag neck · sagittal · 0.40mm/px · 5 of 145 slices shown, 6 images]
[im 49/145  bone]
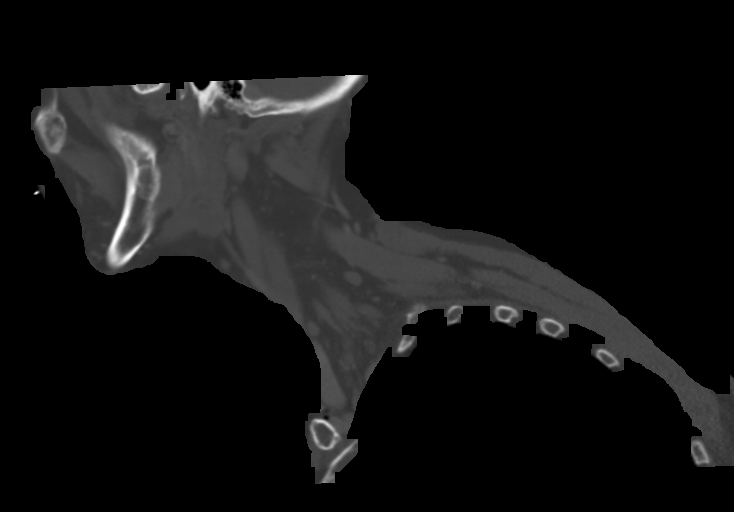
[im 61/145  bone]
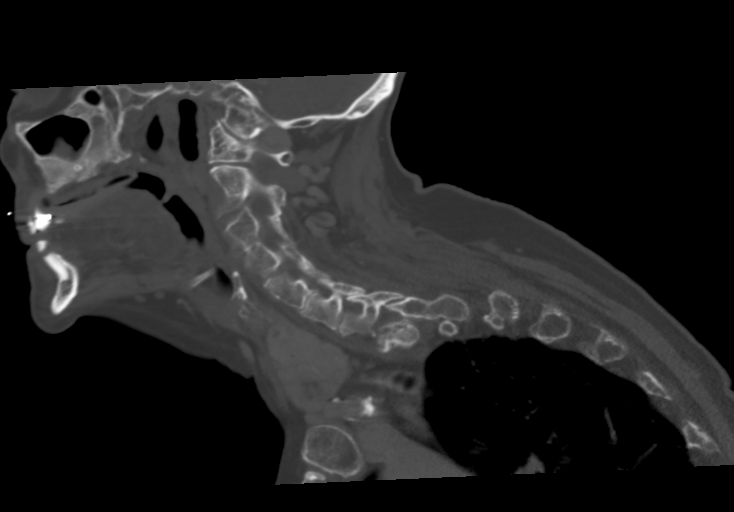
[im 73/145  soft-tissue]
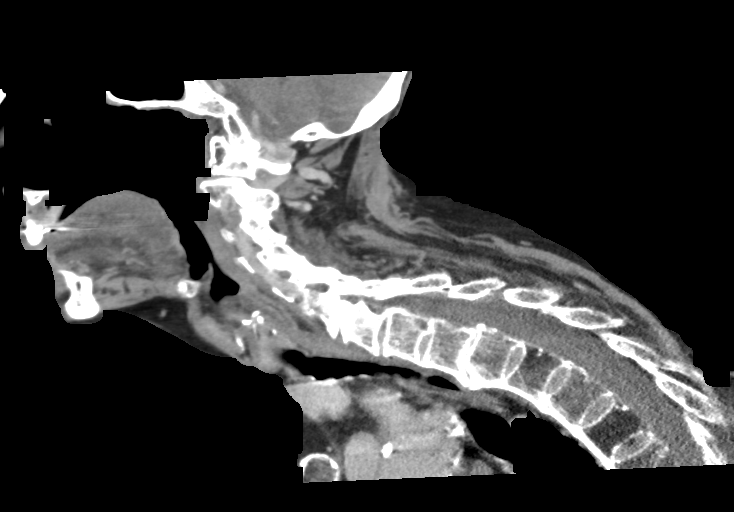
[im 73/145  bone]
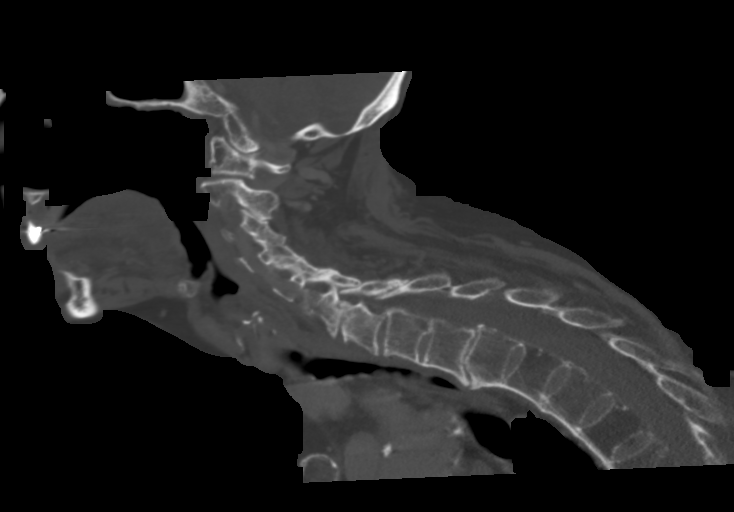
[im 85/145  bone]
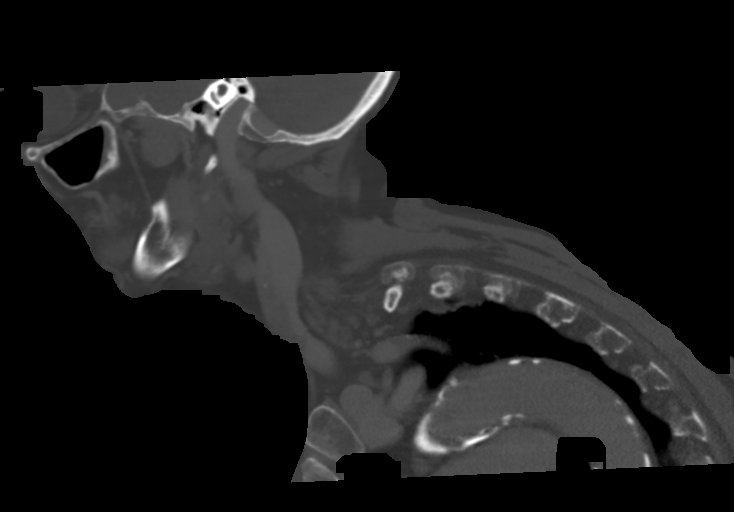
[im 97/145  bone]
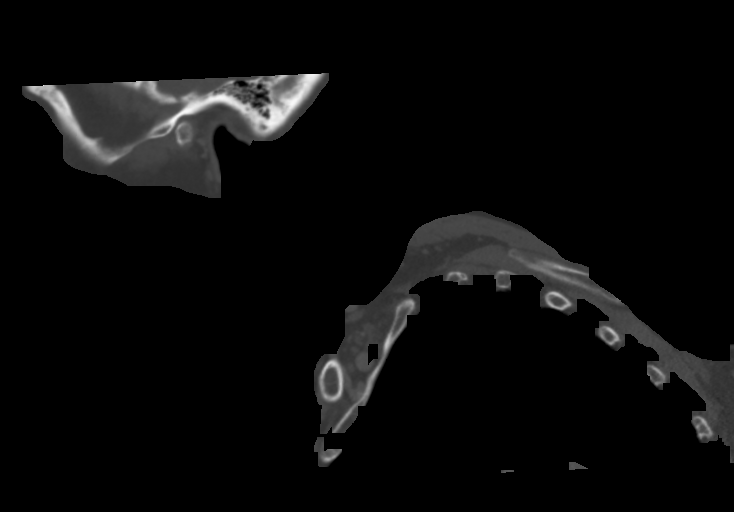

[Series 6: ax oropharynx · axial · 0.48mm/px · z∈[-249,-114]mm · 4 of 101 slices shown, 5 images]
[im 15/101  soft-tissue]
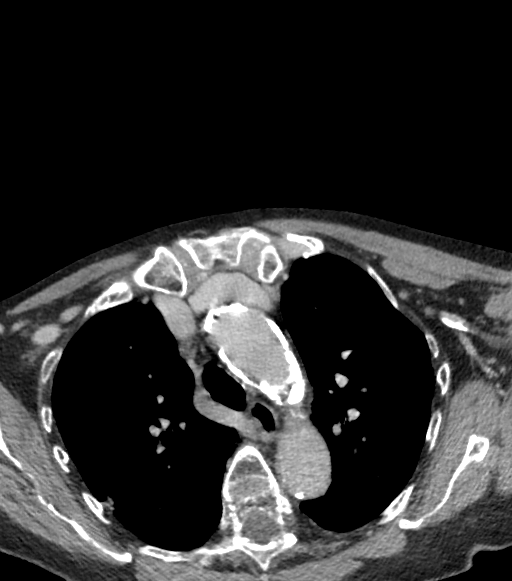
[im 15/101  bone]
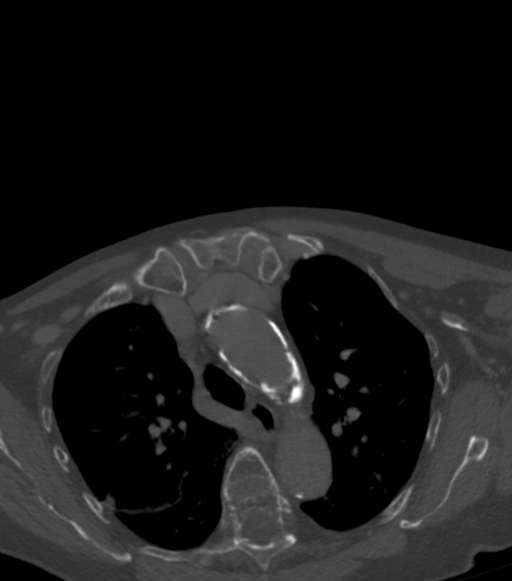
[im 43/101  bone]
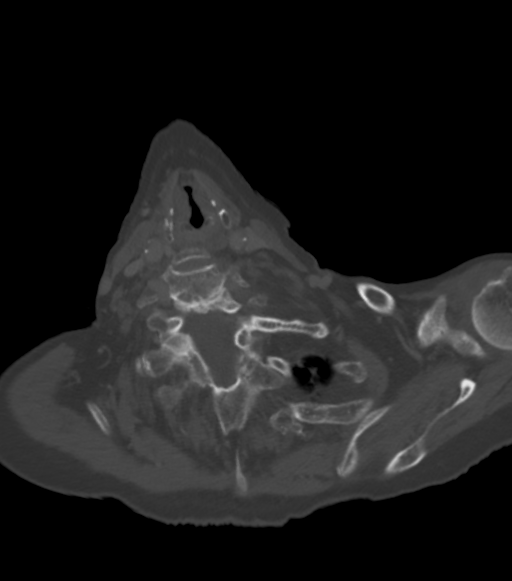
[im 58/101  bone]
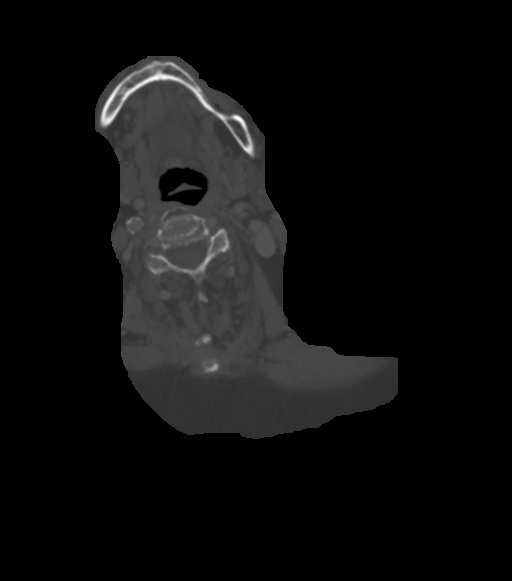
[im 86/101  bone]
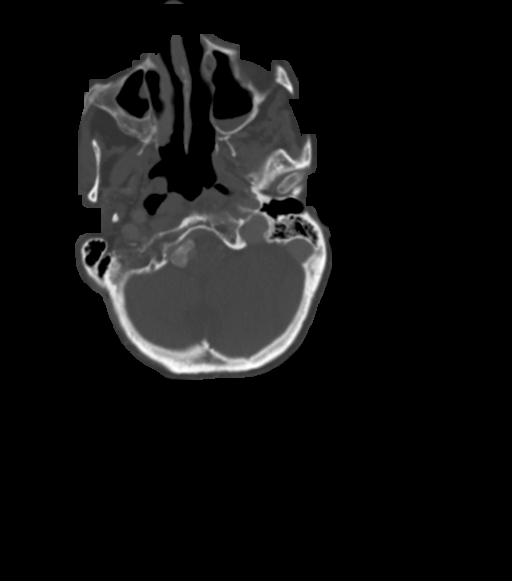

[12 of 33 positions shown; findings below may reference images not displayed]

FINDINGS: Oral cavity: Along the left lower alveolar ridge there is a 2.1 x
1.2 by 1.4 cm enhancing soft tissue focus with central
hypoattenuation ([DATE], [DATE]). There is extension of soft tissue
medially communicating with the left sublingual space ([DATE]). There
are no focal erosions involving the adjacent alveolar ridge. Minimal
stranding of the left perimandibular soft tissues anteriorly ([DATE]).
Right mylohyoid boutonniere deformity, normal anatomic variant.

Pharynx and larynx: The nasopharynx is unremarkable. There is a new
5 mm nodular focus within the left vallecula ([DATE]). Partial
effacement of the left piriform sinus. The larynx and hypopharynx
are otherwise unremarkable.

Salivary glands: No inflammation, mass, or stone.

Thyroid: Redemonstration of enlarged multinodular goiter. Dominant
heterogenously enhancing nodule within the inferior right thyroid
measures 3.1 cm ([DATE], previously 3.1 cm).

Lymph nodes: Asymmetrically prominent 7 mm left level 2A node ([DATE])
is grossly unchanged since 5955. Prominent bilateral level 1 B
nodes.

Vascular: Normal intravascular enhancement. Diffuse calcified
atheromatous plaque predominantly involving the aortic arch and
proximal great vessels.

Limited intracranial: No acute intracranial process. Calcified,
dural-based right cerebellopontine angle mass measuring 1.3 cm ([DATE])
is unchanged and consistent with meningioma.

Visualized orbits: Sequela of bilateral cataract repair.

Mastoids and visualized paranasal sinuses: Mild right ethmoid and
sphenoid sinus mucosal thickening with layering secretions. Trace
left sphenoid sinus layering secretions. Bilateral maxillary sinus
atelectasis with right greater than left hyperostosis, mild mucosal
thickening and layering secretions.

Skeleton: Exaggerated thoracic kyphosis. Minimal grade 1 C2-3
retrolisthesis. Ossification of the anterior longitudinal ligament
along the upper thoracic spine.

Upper chest: Dependent atelectasis.

Other: None.
IMPRESSION: Enhancing soft tissue abutting the lower left alveolar ridge and
communicating with the sublingual space is concerning for neoplasm.
Further evaluation with tissue sampling is recommended.

5 mm left vallecula nodule is new. Direct visualization is
recommended.

Prominent bilateral level 1B and left level 2A nodes are
nonspecific.

Right posterior fossa calcified meningioma is unchanged.

## 2021-11-10 ENCOUNTER — Other Ambulatory Visit: Payer: Self-pay | Admitting: Cardiology

## 2021-12-11 ENCOUNTER — Other Ambulatory Visit: Payer: Self-pay | Admitting: Cardiology

## 2021-12-16 ENCOUNTER — Other Ambulatory Visit (HOSPITAL_COMMUNITY): Payer: Self-pay | Admitting: Family Medicine

## 2021-12-16 ENCOUNTER — Other Ambulatory Visit: Payer: Self-pay | Admitting: Family Medicine

## 2021-12-16 DIAGNOSIS — R410 Disorientation, unspecified: Secondary | ICD-10-CM

## 2021-12-17 ENCOUNTER — Other Ambulatory Visit: Payer: Self-pay

## 2021-12-17 ENCOUNTER — Ambulatory Visit (HOSPITAL_COMMUNITY)
Admission: RE | Admit: 2021-12-17 | Discharge: 2021-12-17 | Disposition: A | Discharge status: Home / Self Care | Payer: Medicare Other | Source: Ambulatory Visit | Attending: Family Medicine | Admitting: Family Medicine

## 2021-12-17 DIAGNOSIS — R410 Disorientation, unspecified: Secondary | ICD-10-CM | POA: Insufficient documentation

## 2021-12-24 ENCOUNTER — Encounter: Payer: Self-pay | Admitting: Cardiology

## 2021-12-24 ENCOUNTER — Other Ambulatory Visit: Payer: Self-pay

## 2021-12-24 ENCOUNTER — Ambulatory Visit (INDEPENDENT_AMBULATORY_CARE_PROVIDER_SITE_OTHER): Payer: Medicare Other | Admitting: Cardiology

## 2021-12-24 VITALS — BP 130/50 | HR 86 | Ht <= 58 in | Wt 98.2 lb

## 2021-12-24 DIAGNOSIS — I5032 Chronic diastolic (congestive) heart failure: Secondary | ICD-10-CM

## 2021-12-24 DIAGNOSIS — I10 Essential (primary) hypertension: Secondary | ICD-10-CM

## 2021-12-24 MED ORDER — FUROSEMIDE 20 MG PO TABS: 20.0000 mg | ORAL_TABLET | Freq: Every day | ORAL | 6 refills | Status: DC

## 2021-12-24 MED ORDER — FUROSEMIDE 20 MG PO TABS
20.0000 mg | ORAL_TABLET | Freq: Every day | ORAL | 6 refills | Status: DC
Start: 2021-12-24 — End: 2021-12-24

## 2021-12-24 NOTE — Patient Instructions (Signed)
Medication Instructions:  Your physician has recommended you make the following change in your medication:  Decrease lasix to 20 mg once a day (may take additional 20 mg as needed for swelling) Continue all medications as directed  Labwork: none  Testing/Procedures: none  Follow-Up: Your physician recommends that you schedule a follow-up appointment in: 6 months  Any Other Special Instructions Will Be Listed Below (If Applicable).  If you need a refill on your cardiac medications before your next appointment, please call your pharmacy.

## 2021-12-24 NOTE — Progress Notes (Signed)
Clinical Summary Ms. Schuur is a 88 y.o.female seen today for follow up of the following medical problems.      1. Chronic diastolic HF - echo 78/9381 LVEF 35-40%. Family asked to defer ischemic testing at that time.  - f/u echo after medical therapy showed normalization of LVEF. 04/2017 echo LVEF 01-75%, grade I diastolic dysfunction. Mild AI.  - lisionpril caused rash.       09/2018 LVEF 10-25%, grade I diastolic dysfunction.      - some recent dizzy spells, typically with standing.  - has had poor appetite recently, decreased oral intake.  - no SOB/DOE, no LE edema - has been taking lasix 40mg  daily.      2. SOB - history of both COPD, CHF.  - no significant recent symptoms     3. COPD  - previously followed by Dr Luan Pulling     4. Ulcer - noted by 08/2020 EGD - now off ASA   5. Abnoraml stress test Jan 2020 nuclear stress: Findings consistent with prior inferior/inferoseptal/septal myocardial infarction with moderate peri-infarct ischemia  - left rib pain from time to time laying at night, no specific cardiac chest pain.    6. HTN - compliant with meds  7. Vision loss - ongoign workup by pcp   Past Medical History:  Diagnosis Date   Acute blood loss anemia 08/27/2020   Cancer (HCC)    Skin, basal   CHF (congestive heart failure) (HCC)    Complication of anesthesia 1950's   'Years ago difficult awaking up"   COPD (chronic obstructive pulmonary disease) ([place])    Coronary artery calcification seen on CT scan    Multivessel   Dyspnea    short of breath walking in home, breathing normal after sitting down   History of blood transfusion    History of cardiomyopathy    a. EF 35-40% by echo in 10/2016 b. improved to 55-60% by echo in 04/2017   History of kidney stones    History of shingles    Hypertension    Kyphosis    marked severity   Osteoarthritis    Pneumonia 10/04/2016   Salivary gland stone    Sepsis (HCC)      Allergies  Allergen  Reactions   Lisinopril Hives   Erythromycin Swelling   Levaquin [Levofloxacin] Other (See Comments)    Tongue turns red and sore   Penicillins Swelling    Has patient had a PCN reaction causing immediate rash, facial/tongue/throat swelling, SOB or lightheadedness with hypotension: Yes Has patient had a PCN reaction causing severe rash involving mucus membranes or skin necrosis: No Has patient had a PCN reaction that required hospitalization No Has patient had a PCN reaction occurring within the last 10 years: No If all of the above answers are "NO", then may proceed with Cephalosporin use.    Sulfa Antibiotics Other (See Comments)    Tongue turns red and sore     Current Outpatient Medications  Medication Sig Dispense Refill   acetaminophen (TYLENOL) 500 MG tablet Take 1,000 mg by mouth every 6 (six) hours as needed for moderate pain.     ALPRAZolam (XANAX) 0.25 MG tablet Take 0.025 mg by mouth at bedtime.     amLODipine (NORVASC) 5 MG tablet TAKE 1 TABLET BY MOUTH DAILY 90 tablet 2   Cholecalciferol (VITAMIN D-3) 125 MCG (5000 UT) TABS Take 1 capsule by mouth daily as needed. Takes 3 or 4 times a week  feeding supplement, ENSURE ENLIVE, (ENSURE ENLIVE) LIQD Take 237 mLs by mouth 2 (two) times daily between meals. 237 mL 12   furosemide (LASIX) 40 MG tablet TAKE 1 AND 1/2 TABLETS BY MOUTH DAILY (MAY take an extra 1/2 tablet AS NEEDED FOR SEVERE swelling) 135 tablet 1   ipratropium-albuterol (DUONEB) 0.5-2.5 (3) MG/3ML SOLN Take 3 mLs by nebulization every 4 (four) hours as needed.     Iron-Vitamins (S.S.S. TONIC) LIQD Take by mouth daily. 3 tablespoons daily     metoprolol succinate (TOPROL-XL) 100 MG 24 hr tablet TAKE ONE-HALF TABLET BY  MOUTH IN THE MORNING AND AT BEDTIME WITH OR IMMEDIATELY FOLLOWING A MEAL 90 tablet 3   nitroGLYCERIN (NITROSTAT) 0.4 MG SL tablet Place 1 tablet (0.4 mg total) under the tongue every 5 (five) minutes x 3 doses as needed for chest pain (If no relief  after 2nd dose, proceed to the ED for an evaluation or call 911). 25 tablet 3   pantoprazole (PROTONIX) 40 MG tablet Take 1 tablet (40 mg total) by mouth daily. 30 tablet 0   potassium chloride SA (KLOR-CON M) 20 MEQ tablet TAKE 1 TABLET BY MOUTH  DAILY . TAKE ONLY WHEN  FUROSEMIDE IS TAKEN. 90 tablet 3   umeclidinium-vilanterol (ANORO ELLIPTA) 62.5-25 MCG/INH AEPB Inhale 1 puff into the lungs daily.     No current facility-administered medications for this visit.     Past Surgical History:  Procedure Laterality Date   BIOPSY  08/28/2020   Procedure: BIOPSY;  Surgeon: Daneil Dolin, MD;  Location: [facility];  Service: Endoscopy;;   BLADDER SURGERY     BUNIONECTOMY     CATARACT EXTRACTION Bilateral    CHOLECYSTECTOMY     COLONOSCOPY  11/16/2012   Procedure: COLONOSCOPY;  Surgeon: Rogene Houston, MD;  Location: [facility];  Service: Endoscopy;  Laterality: N/A;  100   ESOPHAGOGASTRODUODENOSCOPY (EGD) WITH PROPOFOL N/A 08/28/2020   Procedure: ESOPHAGOGASTRODUODENOSCOPY (EGD) WITH PROPOFOL;  Surgeon: Daneil Dolin, MD;  Location: [facility];  Service: Endoscopy;  Laterality: N/A;   LITHOTRIPSY     MASS EXCISION N/A 03/21/2020   Procedure: EXCISION ORAL MASS;  Surgeon: Leta Baptist, MD;  Location: [facility];  Service: ENT;  Laterality: N/A;   ORIF PERIPROSTHETIC FRACTURE Right 10/29/2014   Procedure: OPEN REDUCTION INTERNAL FIXATION (ORIF) PERIPROSTHETIC FEMUR FRACTURE/FEMUR SHAFT;  Surgeon: Mcarthur Rossetti, MD;  Location: [facility];  Service: Orthopedics;  Laterality: Right;   TONSILLECTOMY     TOTAL HIP ARTHROPLASTY     1989     Allergies  Allergen Reactions   Lisinopril Hives   Erythromycin Swelling   Levaquin [Levofloxacin] Other (See Comments)    Tongue turns red and sore   Penicillins Swelling    Has patient had a PCN reaction causing immediate rash, facial/tongue/throat swelling, SOB or lightheadedness with hypotension: Yes Has patient had a PCN reaction causing  severe rash involving mucus membranes or skin necrosis: No Has patient had a PCN reaction that required hospitalization No Has patient had a PCN reaction occurring within the last 10 years: No If all of the above answers are "NO", then may proceed with Cephalosporin use.    Sulfa Antibiotics Other (See Comments)    Tongue turns red and sore      Family History  Problem Relation Age of Onset   Hypertension Sister    Colon cancer Neg Hx      Social History Ms. Trafton reports that she quit smoking  about 24 years ago. Her smoking use included cigarettes. She has a 17.50 pack-year smoking history. She has never used smokeless tobacco. Ms. Herrero reports no history of alcohol use.   Review of Systems CONSTITUTIONAL: No weight loss, fever, chills, weakness or fatigue.  HEENT: per hpi SKIN: No rash or itching.  CARDIOVASCULAR: per hpi RESPIRATORY: No shortness of breath, cough or sputum.  GASTROINTESTINAL: No anorexia, nausea, vomiting or diarrhea. No abdominal pain or blood.  GENITOURINARY: No burning on urination, no polyuria NEUROLOGICAL: No headache, dizziness, syncope, paralysis, ataxia, numbness or tingling in the extremities. No change in bowel or bladder control.  MUSCULOSKELETAL: No muscle, back pain, joint pain or stiffness.  LYMPHATICS: No enlarged nodes. No history of splenectomy.  PSYCHIATRIC: No history of depression or anxiety.  ENDOCRINOLOGIC: No reports of sweating, cold or heat intolerance. No polyuria or polydipsia.  Marland Kitchen   Physical Examination Today's Vitals   12/24/21 1253  BP: (!) 130/50  Pulse: 86  SpO2: 98%  Weight: 98 lb 3.2 oz (44.5 kg)  Height: 4\' 7"  (1.397 m)   Body mass index is 22.82 kg/m.  Gen: resting comfortably, no acute distress HEENT: no scleral icterus, pupils equal round and reactive, no palptable cervical adenopathy,  CV: RRR, no m/r/g, no jvd Resp: Clear to auscultation bilaterally GI: abdomen is soft, non-tender, non-distended,  normal bowel sounds, no hepatosplenomegaly MSK: extremities are warm, no edema.  Skin: warm, no rash Neuro:  no focal deficits Psych: appropriate affect   Diagnostic Studies  Jan 2020 nuclear stress There was no ST segment deviation noted during stress. Findings consistent with prior inferior/inferoseptal/septal myocardial infarction with moderate peri-infarct ischemia. This is an intermediate risk study. The left ventricular ejection fraction is hyperdynamic (>65%   Assessment and Plan   1. Chronic diastolic HF -no recent symptoms - some recent orthostatic dizziness, lower lasix to 20mg  daily may take additional 20mg  as needed     2. HTN - bp at goal, continue current meds    F/u 6 months     Arnoldo Lenis, M.D.

## 2021-12-26 ENCOUNTER — Other Ambulatory Visit: Payer: Self-pay | Admitting: Neurosurgery

## 2021-12-26 DIAGNOSIS — F05 Delirium due to known physiological condition: Secondary | ICD-10-CM

## 2022-01-13 ENCOUNTER — Other Ambulatory Visit: Payer: Self-pay | Admitting: Neurosurgery

## 2022-01-13 DIAGNOSIS — F05 Delirium due to known physiological condition: Secondary | ICD-10-CM

## 2022-01-14 ENCOUNTER — Ambulatory Visit
Admission: RE | Admit: 2022-01-14 | Discharge: 2022-01-14 | Disposition: A | Discharge status: Home / Self Care | Payer: Medicare Other | Source: Ambulatory Visit | Attending: Neurosurgery | Admitting: Neurosurgery

## 2022-01-14 ENCOUNTER — Other Ambulatory Visit: Payer: Self-pay

## 2022-01-14 ENCOUNTER — Other Ambulatory Visit: Payer: Medicare Other

## 2022-02-04 ENCOUNTER — Other Ambulatory Visit (HOSPITAL_COMMUNITY): Payer: Self-pay | Admitting: Neurosurgery

## 2022-02-04 DIAGNOSIS — D320 Benign neoplasm of cerebral meninges: Secondary | ICD-10-CM

## 2022-02-15 ENCOUNTER — Encounter (HOSPITAL_BASED_OUTPATIENT_CLINIC_OR_DEPARTMENT_OTHER): Payer: Self-pay

## 2022-02-15 ENCOUNTER — Ambulatory Visit (HOSPITAL_BASED_OUTPATIENT_CLINIC_OR_DEPARTMENT_OTHER)
Admission: RE | Admit: 2022-02-15 | Discharge: 2022-02-15 | Disposition: A | Discharge status: Home / Self Care | Payer: Medicare Other | Source: Ambulatory Visit | Attending: Neurosurgery | Admitting: Neurosurgery

## 2022-02-15 DIAGNOSIS — D320 Benign neoplasm of cerebral meninges: Secondary | ICD-10-CM | POA: Insufficient documentation

## 2022-02-15 LAB — POCT I-STAT CREATININE: Creatinine, Ser: 0.9 mg/dL (ref 0.44–1.00)

## 2022-02-15 MED ORDER — IOHEXOL 300 MG/ML  SOLN
100.0000 mL | Freq: Once | INTRAMUSCULAR | Status: AC | PRN
  Administered 2022-02-15: 75 mL via INTRAVENOUS

## 2022-02-25 ENCOUNTER — Other Ambulatory Visit: Payer: Self-pay | Admitting: Cardiology

## 2022-03-10 ENCOUNTER — Telehealth: Payer: Self-pay | Admitting: Cardiology

## 2022-03-10 NOTE — Telephone Encounter (Signed)
Pharmacy called to get list of medication fax over to there office. Fax [phone]. Please advise  ?

## 2022-03-10 NOTE — Telephone Encounter (Signed)
Medication list faxed to pharmacy.  ?

## 2022-03-19 ENCOUNTER — Telehealth: Payer: Self-pay | Admitting: Cardiology

## 2022-03-19 MED ORDER — METOPROLOL SUCCINATE ER 100 MG PO TB24: ORAL_TABLET | ORAL | 3 refills | Status: DC

## 2022-03-19 MED ORDER — FUROSEMIDE 20 MG PO TABS: 20.0000 mg | ORAL_TABLET | Freq: Every day | ORAL | 6 refills | Status: DC

## 2022-03-19 MED ORDER — POTASSIUM CHLORIDE CRYS ER 20 MEQ PO TBCR: EXTENDED_RELEASE_TABLET | ORAL | 3 refills | Status: DC

## 2022-03-19 MED ORDER — AMLODIPINE BESYLATE 5 MG PO TABS
5.0000 mg | ORAL_TABLET | Freq: Every day | ORAL | 2 refills | Status: DC
Start: 2022-03-19 — End: 2022-12-02

## 2022-03-19 NOTE — Telephone Encounter (Signed)
Pt c/o medication issue:  1. Name of Medication: amLODipine (NORVASC) 5 MG tablet; furosemide (LASIX) 20 MG tablet; metoprolol succinate (TOPROL-XL) 100 MG 24 hr tablet; potassium chloride SA (KLOR-CON M) 20 MEQ tablet  2. How are you currently taking this medication (dosage and times per day)? As written; please write prescription    3. Are you having a reaction (difficulty breathing--STAT)? No   4. What is your medication issue? Medication prescription needs to be sent to  [facility]

## 2022-03-19 NOTE — Telephone Encounter (Signed)
Refills sent to pharmacy per patient request.

## 2022-04-27 ENCOUNTER — Ambulatory Visit: Payer: Medicare Other | Admitting: Neurology

## 2022-04-27 ENCOUNTER — Encounter: Payer: Self-pay | Admitting: Neurology

## 2022-04-27 VITALS — BP 151/79 | HR 87 | Ht <= 58 in | Wt 94.5 lb

## 2022-04-27 DIAGNOSIS — F05 Delirium due to known physiological condition: Secondary | ICD-10-CM

## 2022-04-27 DIAGNOSIS — I6930 Unspecified sequelae of cerebral infarction: Secondary | ICD-10-CM

## 2022-04-27 DIAGNOSIS — F09 Unspecified mental disorder due to known physiological condition: Secondary | ICD-10-CM

## 2022-04-27 MED ORDER — ASPIRIN 81 MG PO TBEC: 81.0000 mg | DELAYED_RELEASE_TABLET | Freq: Every day | ORAL | 12 refills | Status: DC

## 2022-04-27 NOTE — Progress Notes (Signed)
[facility]  PATIENT: Hannah Mckee DOB: Nov 17, 1930  REQUESTING CLINICIAN: Bedelia Person, MD HISTORY FROM: Patient and daughter Hannah Mckee  REASON FOR VISIT: Episode of changes in vision, and confusion   HISTORICAL  CHIEF COMPLAINT:  Chief Complaint  Patient presents with   New Patient (Initial Visit)    Room 13 w/ dgt, Hannah Mckee. Reports having confusion, unsteadiness, partial vision loss in right eye and hallucinations on 12/15/21. She is still having some continuation of these symptoms. CT head confirmed a stroke. She was not able to complete the MRI due to her posturing (unable to get into the machine).     HISTORY OF PRESENT ILLNESS:  88 year old woman past medical history of hypertension who is presenting after experiencing an episode of acute confusion and loss of vision.  Patient reports on Valentine's Day, February 14 she had a sudden episode of confusion associated with loss of vision, she said that she could not remember the name of the manager that she know for long time.  The confusion persisted for a few days.  She reported on the following day she did present to her PCP who ordered a head CT they were told that the head CT was normal and they wanted to get an MRI but on chart review the head CT showed an old left PCA stroke. During this time also she was having visual hallucinations that she described as seeing a small child in her right peripheral vision, she did present to the ophthalmologist who reported that this might be secondary to glaucoma or maybe related to a stroke. She did have the repeat CT with contrast in April and it showed again the left PCA stroke but patient and family were concerned since this was the first time to have heard about the left PCA stroke.  There was also a finding of a small meningioma. They did follow up with neurosurgery but patient is not a surgical candidate. Patient and daughter had reported since then her memory improved  but she still having problem with recall, remembering certain items.  She still independent, lives alone and is independent in all ADLs.   TBI:   No past history of TBI Stroke: Yes, but found incidentally.  Seizures:   no past history of seizures Sleep:   no history of sleep apnea.   Mood:  patient denies anxiety and depression  Functional status: independent in all ADLs and IADLs Patient lives independently  Cooking: patient, microwave food  Cleaning: patient, daughter also help Bathing: patient, no help needed Toileting: patient, no help needed Driving: no  Bills: patient/Family   Ever left the stove on by accident?: denies  Forget how to use items around the house?: denies  Getting lost going to familiar places?: denies  Forgetting loved ones names?: denies  Word finding difficulty? Denies  Sleep: good    OTHER MEDICAL CONDITIONS: Hypertension, Meningioma   REVIEW OF SYSTEMS: Full 14 system review of systems performed and negative with exception of: as noted in the HPI   ALLERGIES: Allergies  Allergen Reactions   Lisinopril Hives   Erythromycin Swelling   Levaquin [Levofloxacin] Other (See Comments)    Tongue turns red and sore   Penicillins Swelling    Has patient had a PCN reaction causing immediate rash, facial/tongue/throat swelling, SOB or lightheadedness with hypotension: Yes Has patient had a PCN reaction causing severe rash involving mucus membranes or skin necrosis: No Has patient had a PCN reaction that required hospitalization No Has  patient had a PCN reaction occurring within the last 10 years: No If all of the above answers are "NO", then may proceed with Cephalosporin use.    Sulfa Antibiotics Other (See Comments)    Tongue turns red and sore    HOME MEDICATIONS: Outpatient Medications Prior to Visit  Medication Sig Dispense Refill   acetaminophen (TYLENOL) 500 MG tablet Take 1,000 mg by mouth every 6 (six) hours as needed for moderate pain.      ALPRAZolam (XANAX) 0.25 MG tablet Take 0.025 mg by mouth at bedtime.     amLODipine (NORVASC) 5 MG tablet Take 1 tablet (5 mg total) by mouth daily. 90 tablet 2   feeding supplement, ENSURE ENLIVE, (ENSURE ENLIVE) LIQD Take 237 mLs by mouth 2 (two) times daily between meals. 237 mL 12   furosemide (LASIX) 20 MG tablet Take 1 tablet (20 mg total) by mouth daily. May take an extra 20 mg as needed 45 tablet 6   ipratropium-albuterol (DUONEB) 0.5-2.5 (3) MG/3ML SOLN Take 3 mLs by nebulization every 4 (four) hours as needed.     Iron-Vitamins (S.S.S. TONIC) LIQD Take by mouth daily. 3 tablespoons daily     metoprolol succinate (TOPROL-XL) 100 MG 24 hr tablet TAKE ONE-HALF TABLET BY  MOUTH IN THE MORNING AND AT BEDTIME WITH OR IMMEDIATELY FOLLOWING A MEAL 90 tablet 3   nitroGLYCERIN (NITROSTAT) 0.4 MG SL tablet Place 1 tablet (0.4 mg total) under the tongue every 5 (five) minutes x 3 doses as needed for chest pain (If no relief after 2nd dose, proceed to the ED for an evaluation or call 911). 25 tablet 3   pantoprazole (PROTONIX) 40 MG tablet Take 1 tablet (40 mg total) by mouth daily. 30 tablet 0   potassium chloride SA (KLOR-CON M) 20 MEQ tablet TAKE 1 TABLET BY MOUTH  DAILY . TAKE ONLY WHEN  FUROSEMIDE IS TAKEN. 90 tablet 3   umeclidinium-vilanterol (ANORO ELLIPTA) 62.5-25 MCG/INH AEPB Inhale 1 puff into the lungs daily.     Cholecalciferol (VITAMIN D-3) 125 MCG (5000 UT) TABS Take 1 capsule by mouth daily as needed. Takes 3 or 4 times a week     No facility-administered medications prior to visit.    PAST MEDICAL HISTORY: Past Medical History:  Diagnosis Date   Acute blood loss anemia 08/27/2020   Cancer (HCC)    Skin, basal   CHF (congestive heart failure) (HCC)    Complication of anesthesia 1950's   'Years ago difficult awaking up"   COPD (chronic obstructive pulmonary disease) (HCC)    Coronary artery calcification seen on CT scan    Multivessel   Dyspnea    short of breath walking in  home, breathing normal after sitting down   History of blood transfusion    History of cardiomyopathy    a. EF 35-40% by echo in 10/2016 b. improved to 55-60% by echo in 04/2017   History of kidney stones    History of shingles    Hypertension    Kyphosis    marked severity   Osteoarthritis    Pneumonia 10/04/2016   Salivary gland stone    Sepsis (HCC)     PAST SURGICAL HISTORY: Past Surgical History:  Procedure Laterality Date   BIOPSY  08/28/2020   Procedure: BIOPSY;  Surgeon: Corbin Ade, MD;  Location: [facility];  Service: Endoscopy;;   BLADDER SURGERY     BUNIONECTOMY     CATARACT EXTRACTION Bilateral    CHOLECYSTECTOMY  COLONOSCOPY  11/16/2012   Procedure: COLONOSCOPY;  Surgeon: Malissa Hippo, MD;  Location: [facility];  Service: Endoscopy;  Laterality: N/A;  100   ESOPHAGOGASTRODUODENOSCOPY (EGD) WITH PROPOFOL N/A 08/28/2020   Procedure: ESOPHAGOGASTRODUODENOSCOPY (EGD) WITH PROPOFOL;  Surgeon: Corbin Ade, MD;  Location: [facility];  Service: Endoscopy;  Laterality: N/A;   LITHOTRIPSY     MASS EXCISION N/A 03/21/2020   Procedure: EXCISION ORAL MASS;  Surgeon: Newman Pies, MD;  Location: [facility];  Service: ENT;  Laterality: N/A;   ORIF PERIPROSTHETIC FRACTURE Right 10/29/2014   Procedure: OPEN REDUCTION INTERNAL FIXATION (ORIF) PERIPROSTHETIC FEMUR FRACTURE/FEMUR SHAFT;  Surgeon: Kathryne Hitch, MD;  Location: [facility] OR;  Service: Orthopedics;  Laterality: Right;   TONSILLECTOMY     TOTAL HIP ARTHROPLASTY     1989    FAMILY HISTORY: Family History  Problem Relation Age of Onset   Hypertension Sister    Colon cancer Neg Hx     SOCIAL HISTORY: Social History   Socioeconomic History   Marital status: Divorced    Spouse name: Not on file   Number of children: Not on file   Years of education: 11th grade   Highest education level: Not on file  Occupational History   Not on file  Tobacco Use   Smoking status: Former    Packs/day: 0.50     Years: 35.00    Total pack years: 17.50    Types: Cigarettes    Quit date: 01/1997    Years since quitting: 25.2   Smokeless tobacco: Never  Vaping Use   Vaping Use: Never used  Substance and Sexual Activity   Alcohol use: No   Drug use: No   Sexual activity: Not Currently  Other Topics Concern   Not on file  Social History Narrative   Right-handed.   Lives alone in an apartment.   Social Determinants of Health   Financial Resource Strain: Not on file  Food Insecurity: No Food Insecurity (01/20/2021)   Hunger Vital Sign    Worried About Running Out of Food in the Last Year: Never true    Ran Out of Food in the Last Year: Never true  Transportation Needs: No Transportation Needs (01/20/2021)   PRAPARE - Administrator, Civil Service (Medical): No    Lack of Transportation (Non-Medical): No  Physical Activity: Not on file  Stress: Not on file  Social Connections: Not on file  Intimate Partner Violence: Not on file    PHYSICAL EXAM  GENERAL EXAM/CONSTITUTIONAL: Vitals:  Vitals:   04/27/22 1408  BP: (!) 151/79  Pulse: 87  Weight: 94 lb 8 oz (42.9 kg)  Height: 4\' 7"  (1.397 m)   Body mass index is 21.96 kg/m. Wt Readings from Last 3 Encounters:  04/27/22 94 lb 8 oz (42.9 kg)  12/24/21 98 lb 3.2 oz (44.5 kg)  06/11/21 97 lb 6.4 oz (44.2 kg)   Patient is in no distress; well developed, nourished and groomed; neck is supple  EYES: Pupils round and reactive to light, Visual fields full to confrontation, Extraocular movements intacts,   MUSCULOSKELETAL: Gait, strength, tone, movements noted in Neurologic exam below  NEUROLOGIC: MENTAL STATUS:      No data to display            04/27/2022    2:26 PM  Montreal Cognitive Assessment   Visuospatial/ Executive (0/5) 4  Naming (0/3) 3  Attention: Read list of digits (0/2) 2  Attention: Read  list of letters (0/1) 1  Attention: Serial 7 subtraction starting at 100 (0/3) 3  Language: Repeat phrase  (0/2) 2  Language : Fluency (0/1) 0  Abstraction (0/2) 2  Delayed Recall (0/5) 0  Orientation (0/6) 5  Total 22  Adjusted Score (based on education) 23     CRANIAL NERVE:  2nd, 3rd, 4th, 6th - pupils equal and reactive to light, visual fields full to confrontation, extraocular muscles intact, no nystagmus 5th - facial sensation symmetric 7th - facial strength symmetric 8th - hearing intact 9th - palate elevates symmetrically, uvula midline 11th - shoulder shrug symmetric 12th - tongue protrusion midline  MOTOR:  normal bulk and tone, full strength in the BUE, BLE  SENSORY:  normal and symmetric to light touch, vibration  COORDINATION:  finger-nose-finger, fine finger movements normal  REFLEXES:  deep tendon reflexes present and symmetric  GAIT/STATION:  Walks with a walker      DIAGNOSTIC DATA (LABS, IMAGING, TESTING) - I reviewed patient records, labs, notes, testing and imaging myself where available.  Lab Results  Component Value Date   WBC 9.5 08/29/2020   HGB 8.2 (L) 08/29/2020   HCT 26.6 (L) 08/29/2020   MCV 92.0 08/29/2020   PLT 256 08/29/2020      Component Value Date/Time   NA 139 08/28/2020 0547   K 3.9 08/28/2020 0547   CL 108 08/28/2020 0547   CO2 25 08/28/2020 0547   GLUCOSE 100 (H) 08/28/2020 0547   BUN 22 08/28/2020 0547   CREATININE 0.90 02/15/2022 1700   CREATININE 0.86 01/02/2013 1233   CALCIUM 8.3 (L) 08/28/2020 0547   PROT 6.8 08/27/2020 1555   ALBUMIN 3.3 (L) 08/27/2020 1555   AST 16 08/27/2020 1555   ALT 11 08/27/2020 1555   ALKPHOS 74 08/27/2020 1555   BILITOT 0.5 08/27/2020 1555   GFRNONAA >60 08/28/2020 0547   GFRAA >60 09/07/2018 0810   No results found for: "CHOL", "HDL", "LDLCALC", "LDLDIRECT", "TRIG", "CHOLHDL" No results found for: "HGBA1C" No results found for: "VITAMINB12" Lab Results  Component Value Date   TSH 0.112 (L) 05/25/2017    Head CT 12/17/2021:  No acute intracranial abnormality identified. -  chronic right foramen magnum meningioma is 1-2 mm larger since 2018 but mass effect on the lateral brainstem appears stable and there is no evidence of brainstem edema. - small chronic left PCA territory infarct. - mild for age cerebral white matter changes elsewhere, most commonly due to a small vessel disease   Head CT with contrast 02/15/22 1. Stable 1.4 cm posterior fossa meningioma with unchanged mild mass effect on the cervicomedullary junction without evidence of frank compression. 2. Unchanged mild chronic white matter microangiopathy and small remote left PCA territory infarct.     ASSESSMENT AND PLAN  88 y.o. year old female with history of hypertension who is presenting after 1 episode of acute confusional state associated with vision loss.  The confusion lasted for a few days and improved but patient still complains of memory deficit described as having difficulty remembering certain things.  She did have a head CT which showed an old chronic left PCA stroke likely not causing the confusional state on February 14 On exam she scored a 23 out of 30 on the Mini-Mental status exam consistent with cognitive impairment.  She does live alone and as of now still independent.  I informed patient that she likely has mild cognitive impairment that she need to maintain a good health, good diet and good  sleep to reduce her risk of progressing to Alzheimer. Due to her history of left PCA stroke, I will start patient on aspirin EC daily for secondary stroke prevention, they are comfortable with plan.  I will see them in 1 year for follow-up     1. Mild cognitive disorder   2. Chronic ischemic left PCA stroke   3. Acute confusional state      Patient Instructions  Start Aspirin EC 81 for secondary stroke prevention, patient is on protonix  Continue your other medication  Follow up with your PCP and consider additional labs including TSH and Vitamin B12  Return in 1 year    There are  well-accepted and sensible ways to reduce risk for Alzheimers disease and other degenerative brain disorders .  Exercise Daily Walk A daily 20 minute walk should be part of your routine. Disease related apathy can be a significant roadblock to exercise and the only way to overcome this is to make it a daily routine and perhaps have a reward at the end (something your loved one loves to eat or drink perhaps) or a personal trainer coming to the home can also be very useful. Most importantly, the patient is much more likely to exercise if the caregiver / spouse does it with him/her. In general a structured, repetitive schedule is best.  General Health: Any diseases which effect your body will effect your brain such as a pneumonia, urinary infection, blood clot, heart attack or stroke. Keep contact with your primary care doctor for regular follow ups.  Sleep. A good nights sleep is healthy for the brain. Seven hours is recommended. If you have insomnia or poor sleep habits we can give you some instructions. If you have sleep apnea wear your mask.  Diet: Eating a heart healthy diet is also a good idea; fish and poultry instead of red meat, nuts (mostly non-peanuts), vegetables, fruits, olive oil or canola oil (instead of butter), minimal salt (use other spices to flavor foods), whole grain rice, bread, cereal and pasta and wine in moderation.Research is now showing that the MIND diet, which is a combination of The Mediterranean diet and the DASH diet, is beneficial for cognitive processing and longevity. Information about this diet can be found in The MIND Diet, a book by Alonna Minium, MS, RDN, and online at [employer]  Finances, Power of [address] and Advance Directives: You should consider putting legal safeguards in place with regard to financial and medical decision making. While the spouse always has power of attorney for medical and financial issues in the absence of any  form, you should consider what you want in case the spouse / caregiver is no longer around or capable of making decisions.   No orders of the defined types were placed in this encounter.   Meds ordered this encounter  Medications   aspirin EC 81 MG tablet    Sig: Take 1 tablet (81 mg total) by mouth daily. Swallow whole.    Dispense:  30 tablet    Refill:  12    Return in about 1 year (around 04/28/2023).  I have spent a total of 60 minutes dedicated to this patient today, preparing to see patient, performing a medically appropriate examination and evaluation, ordering tests and/or medications and procedures, and counseling and educating the patient/family/caregiver; independently interpreting result and communicating results to the family/patient/caregiver; and documenting clinical information in the electronic medical record.   Windell Norfolk, MD 04/27/2022, 10:04 PM  [facility]  [address] [place], [place] [address] [phone]

## 2022-07-01 ENCOUNTER — Ambulatory Visit: Payer: Medicare Other | Admitting: Cardiology

## 2022-07-01 NOTE — Progress Notes (Deleted)
Clinical Summary Ms. Grow is a 88 y.o.female  seen today for follow up of the following medical problems.      1. Chronic diastolic HF - echo 44/3154 LVEF 35-40%. Family asked to defer ischemic testing at that time.  - f/u echo after medical therapy showed normalization of LVEF. 04/2017 echo LVEF 00-86%, grade I diastolic dysfunction. Mild AI.  - lisionpril caused rash.       09/2018 LVEF 76-19%, grade I diastolic dysfunction.        - some recent dizzy spells, typically with standing.  - has had poor appetite recently, decreased oral intake.  - no SOB/DOE, no LE edema - has been taking lasix '40mg'$  daily.    - due to orthostatic symtoms lowered lasix to '20mg'$  daily last visit   2. SOB - history of both COPD, CHF.  - no significant recent symptoms     3. COPD  - previously followed by Dr Luan Pulling     4. Ulcer - noted by 08/2020 EGD - now off ASA   5. Abnoraml stress test Jan 2020 nuclear stress: Findings consistent with prior inferior/inferoseptal/septal myocardial infarction with moderate peri-infarct ischemia   - left rib pain from time to time laying at night, no specific cardiac chest pain.    6. HTN - compliant with meds   7. Vision loss - ongoign workup by pcp   Past Medical History:  Diagnosis Date   Acute blood loss anemia 08/27/2020   Cancer (HCC)    Skin, basal   CHF (congestive heart failure) (HCC)    Complication of anesthesia 1950's   'Years ago difficult awaking up"   COPD (chronic obstructive pulmonary disease) ([place])    Coronary artery calcification seen on CT scan    Multivessel   Dyspnea    short of breath walking in home, breathing normal after sitting down   History of blood transfusion    History of cardiomyopathy    a. EF 35-40% by echo in 10/2016 b. improved to 55-60% by echo in 04/2017   History of kidney stones    History of shingles    Hypertension    Kyphosis    marked severity   Osteoarthritis    Pneumonia  10/04/2016   Salivary gland stone    Sepsis (HCC)      Allergies  Allergen Reactions   Lisinopril Hives   Erythromycin Swelling   Levaquin [Levofloxacin] Other (See Comments)    Tongue turns red and sore   Penicillins Swelling    Has patient had a PCN reaction causing immediate rash, facial/tongue/throat swelling, SOB or lightheadedness with hypotension: Yes Has patient had a PCN reaction causing severe rash involving mucus membranes or skin necrosis: No Has patient had a PCN reaction that required hospitalization No Has patient had a PCN reaction occurring within the last 10 years: No If all of the above answers are "NO", then may proceed with Cephalosporin use.    Sulfa Antibiotics Other (See Comments)    Tongue turns red and sore     Current Outpatient Medications  Medication Sig Dispense Refill   acetaminophen (TYLENOL) 500 MG tablet Take 1,000 mg by mouth every 6 (six) hours as needed for moderate pain.     ALPRAZolam (XANAX) 0.25 MG tablet Take 0.025 mg by mouth at bedtime.     amLODipine (NORVASC) 5 MG tablet Take 1 tablet (5 mg total) by mouth daily. 90 tablet 2   aspirin EC 81 MG  tablet Take 1 tablet (81 mg total) by mouth daily. Swallow whole. 30 tablet 12   feeding supplement, ENSURE ENLIVE, (ENSURE ENLIVE) LIQD Take 237 mLs by mouth 2 (two) times daily between meals. 237 mL 12   furosemide (LASIX) 20 MG tablet Take 1 tablet (20 mg total) by mouth daily. May take an extra 20 mg as needed 45 tablet 6   ipratropium-albuterol (DUONEB) 0.5-2.5 (3) MG/3ML SOLN Take 3 mLs by nebulization every 4 (four) hours as needed.     Iron-Vitamins (S.S.S. TONIC) LIQD Take by mouth daily. 3 tablespoons daily     metoprolol succinate (TOPROL-XL) 100 MG 24 hr tablet TAKE ONE-HALF TABLET BY  MOUTH IN THE MORNING AND AT BEDTIME WITH OR IMMEDIATELY FOLLOWING A MEAL 90 tablet 3   nitroGLYCERIN (NITROSTAT) 0.4 MG SL tablet Place 1 tablet (0.4 mg total) under the tongue every 5 (five) minutes x 3  doses as needed for chest pain (If no relief after 2nd dose, proceed to the ED for an evaluation or call 911). 25 tablet 3   pantoprazole (PROTONIX) 40 MG tablet Take 1 tablet (40 mg total) by mouth daily. 30 tablet 0   potassium chloride SA (KLOR-CON M) 20 MEQ tablet TAKE 1 TABLET BY MOUTH  DAILY . TAKE ONLY WHEN  FUROSEMIDE IS TAKEN. 90 tablet 3   umeclidinium-vilanterol (ANORO ELLIPTA) 62.5-25 MCG/INH AEPB Inhale 1 puff into the lungs daily.     No current facility-administered medications for this visit.     Past Surgical History:  Procedure Laterality Date   BIOPSY  08/28/2020   Procedure: BIOPSY;  Surgeon: Daneil Dolin, MD;  [facility]: [facility];  Service: Endoscopy;;   BLADDER SURGERY     BUNIONECTOMY     CATARACT EXTRACTION Bilateral    CHOLECYSTECTOMY     COLONOSCOPY  11/16/2012   Procedure: COLONOSCOPY;  Surgeon: Rogene Houston, MD;  [facility]: [facility];  Service: Endoscopy;  Laterality: N/A;  100   ESOPHAGOGASTRODUODENOSCOPY (EGD) WITH PROPOFOL N/A 08/28/2020   Procedure: ESOPHAGOGASTRODUODENOSCOPY (EGD) WITH PROPOFOL;  Surgeon: Daneil Dolin, MD;  [facility]: [facility];  Service: Endoscopy;  Laterality: N/A;   LITHOTRIPSY     MASS EXCISION N/A 03/21/2020   Procedure: EXCISION ORAL MASS;  Surgeon: Leta Baptist, MD;  Location: [facility];  Service: ENT;  Laterality: N/A;   ORIF PERIPROSTHETIC FRACTURE Right 10/29/2014   Procedure: OPEN REDUCTION INTERNAL FIXATION (ORIF) PERIPROSTHETIC FEMUR FRACTURE/FEMUR SHAFT;  Surgeon: Mcarthur Rossetti, MD;  Location: [facility];  Service: Orthopedics;  Laterality: Right;   TONSILLECTOMY     TOTAL HIP ARTHROPLASTY     1989     Allergies  Allergen Reactions   Lisinopril Hives   Erythromycin Swelling   Levaquin [Levofloxacin] Other (See Comments)    Tongue turns red and sore   Penicillins Swelling    Has patient had a PCN reaction causing immediate rash, facial/tongue/throat swelling, SOB or lightheadedness with hypotension:  Yes Has patient had a PCN reaction causing severe rash involving mucus membranes or skin necrosis: No Has patient had a PCN reaction that required hospitalization No Has patient had a PCN reaction occurring within the last 10 years: No If all of the above answers are "NO", then may proceed with Cephalosporin use.    Sulfa Antibiotics Other (See Comments)    Tongue turns red and sore      Family History  Problem Relation Age of Onset   Hypertension Sister    Colon cancer Neg  Hx      Social History Ms. Craghead reports that she quit smoking about 25 years ago. Her smoking use included cigarettes. She has a 17.50 pack-year smoking history. She has never used smokeless tobacco. Ms. Willadsen reports no history of alcohol use.   Review of Systems CONSTITUTIONAL: No weight loss, fever, chills, weakness or fatigue.  HEENT: Eyes: No visual loss, blurred vision, double vision or yellow sclerae.No hearing loss, sneezing, congestion, runny nose or sore throat.  SKIN: No rash or itching.  CARDIOVASCULAR:  RESPIRATORY: No shortness of breath, cough or sputum.  GASTROINTESTINAL: No anorexia, nausea, vomiting or diarrhea. No abdominal pain or blood.  GENITOURINARY: No burning on urination, no polyuria NEUROLOGICAL: No headache, dizziness, syncope, paralysis, ataxia, numbness or tingling in the extremities. No change in bowel or bladder control.  MUSCULOSKELETAL: No muscle, back pain, joint pain or stiffness.  LYMPHATICS: No enlarged nodes. No history of splenectomy.  PSYCHIATRIC: No history of depression or anxiety.  ENDOCRINOLOGIC: No reports of sweating, cold or heat intolerance. No polyuria or polydipsia.  Marland Kitchen   Physical Examination There were no vitals filed for this visit. There were no vitals filed for this visit.  Gen: resting comfortably, no acute distress HEENT: no scleral icterus, pupils equal round and reactive, no palptable cervical adenopathy,  CV Resp: Clear to auscultation  bilaterally GI: abdomen is soft, non-tender, non-distended, normal bowel sounds, no hepatosplenomegaly MSK: extremities are warm, no edema.  Skin: warm, no rash Neuro:  no focal deficits Psych: appropriate affect   Diagnostic Studies  Jan 2020 nuclear stress There was no ST segment deviation noted during stress. Findings consistent with prior inferior/inferoseptal/septal myocardial infarction with moderate peri-infarct ischemia. This is an intermediate risk study. The left ventricular ejection fraction is hyperdynamic (>65%   Assessment and Plan   1. Chronic diastolic HF -no recent symptoms - some recent orthostatic dizziness, lower lasix to '20mg'$  daily may take additional '20mg'$  as needed     2. HTN - bp at goal, continue current meds     Arnoldo Lenis, M.D., F.A.C.C.

## 2022-07-15 ENCOUNTER — Other Ambulatory Visit: Payer: Self-pay | Admitting: Cardiology

## 2022-08-24 ENCOUNTER — Other Ambulatory Visit: Payer: Self-pay | Admitting: Cardiology

## 2022-08-26 ENCOUNTER — Other Ambulatory Visit: Payer: Self-pay | Admitting: Cardiology

## 2022-09-09 ENCOUNTER — Other Ambulatory Visit: Payer: Self-pay | Admitting: Cardiology

## 2022-09-10 ENCOUNTER — Other Ambulatory Visit: Payer: Self-pay | Admitting: Cardiology

## 2022-10-02 ENCOUNTER — Telehealth: Payer: Self-pay | Admitting: Student in an Organized Health Care Education/Training Program

## 2022-10-02 ENCOUNTER — Emergency Department (HOSPITAL_COMMUNITY): Payer: Medicare Other

## 2022-10-02 ENCOUNTER — Encounter (HOSPITAL_COMMUNITY): Payer: Self-pay

## 2022-10-02 ENCOUNTER — Other Ambulatory Visit: Payer: Self-pay

## 2022-10-02 ENCOUNTER — Emergency Department (HOSPITAL_COMMUNITY)
Admission: EM | Admit: 2022-10-02 | Discharge: 2022-10-03 | Disposition: A | Discharge status: Home / Self Care | Payer: Medicare Other | Attending: Emergency Medicine | Admitting: Emergency Medicine

## 2022-10-02 DIAGNOSIS — Z7982 Long term (current) use of aspirin: Secondary | ICD-10-CM | POA: Diagnosis not present

## 2022-10-02 DIAGNOSIS — R531 Weakness: Secondary | ICD-10-CM | POA: Diagnosis not present

## 2022-10-02 DIAGNOSIS — R079 Chest pain, unspecified: Secondary | ICD-10-CM | POA: Insufficient documentation

## 2022-10-02 LAB — BASIC METABOLIC PANEL
Anion gap: 11 (ref 5–15)
BUN: 17 mg/dL (ref 8–23)
CO2: 25 mmol/L (ref 22–32)
Calcium: 9.2 mg/dL (ref 8.9–10.3)
Chloride: 102 mmol/L (ref 98–111)
Creatinine, Ser: 0.65 mg/dL (ref 0.44–1.00)
GFR, Estimated: >60 mL/min (ref >60)
Glucose, Bld: 116 mg/dL — ABNORMAL HIGH (ref 70–99)
Potassium: 3.7 mmol/L (ref 3.5–5.1)
Sodium: 138 mmol/L (ref 135–145)

## 2022-10-02 LAB — CBC
HCT: 36.7 % (ref 36.0–46.0)
Hemoglobin: 11.6 g/dL — ABNORMAL LOW (ref 12.0–15.0)
MCH: 28.7 pg (ref 26.0–34.0)
MCHC: 31.6 g/dL (ref 30.0–36.0)
MCV: 90.8 fL (ref 80.0–100.0)
Platelets: 291 10*3/uL (ref 150–400)
RBC: 4.04 MIL/uL (ref 3.87–5.11)
RDW: 13.8 % (ref 11.5–15.5)
WBC: 11.9 10*3/uL — ABNORMAL HIGH (ref 4.0–10.5)
nRBC: 0 % (ref 0.0–0.2)

## 2022-10-02 LAB — TROPONIN I (HIGH SENSITIVITY)
Troponin I (High Sensitivity): 6 ng/L (ref <18)
Troponin I (High Sensitivity): 6 ng/L (ref <18)

## 2022-10-02 NOTE — ED Provider Notes (Signed)
[facility] Provider Note   CSN: [id number] Arrival date & time: 10/02/22  1525     History  Chief Complaint  Patient presents with   Chest Pain    Hannah Mckee is a 88 y.o. female.  Patient reports she began having pain in her chest at approximately 130 today.  Patient reports she took 2 baby aspirin and 3 sublingual nitroglycerin.  Patient reports the pain continued for an approximately another 30 minutes after the third nitroglycerin.  Patient reports the pain has currently resolved.  Patient complains of increasing weakness she states she feels like she is losing weight   Chest Pain Pain location:  L chest Pain quality: aching   Pain radiates to:  Does not radiate Pain severity:  Moderate Onset quality:  Gradual Timing:  Constant Progression:  Worsening Chronicity:  New      Home Medications Prior to Admission medications   Medication Sig Start Date End Date Taking? Authorizing Provider  acetaminophen (TYLENOL) 500 MG tablet Take 1,000 mg by mouth every 6 (six) hours as needed for moderate pain.    [provider]  ALPRAZolam Duanne Moron) 0.25 MG tablet Take 0.025 mg by mouth at bedtime. 12/03/20   [provider]  amLODipine (NORVASC) 5 MG tablet Take 1 tablet (5 mg total) by mouth daily. 03/19/22   Arnoldo Lenis, MD  aspirin EC 81 MG tablet Take 1 tablet (81 mg total) by mouth daily. Swallow whole. 04/27/22   Alric Ran, MD  feeding supplement, ENSURE ENLIVE, (ENSURE ENLIVE) LIQD Take 237 mLs by mouth 2 (two) times daily between meals. 10/11/16   Dhungel, Flonnie Overman, MD  furosemide (LASIX) 20 MG tablet Take 1 tablet (20 mg total) by mouth daily. May take an extra 20 mg as needed 03/19/22   Arnoldo Lenis, MD  ipratropium-albuterol (DUONEB) 0.5-2.5 (3) MG/3ML SOLN Take 3 mLs by nebulization every 4 (four) hours as needed.    [provider]  Iron-Vitamins (S.S.S. TONIC) LIQD Take by mouth daily. 3 tablespoons daily     [provider]  metoprolol succinate (TOPROL-XL) 100 MG 24 hr tablet TAKE ONE-HALF TABLET BY MOUTH IN THE MORNING AND AT BEDTIME WITH  OR IMMEDIATELY FOLLOWING A MEAL 08/24/22   Arnoldo Lenis, MD  nitroGLYCERIN (NITROSTAT) 0.4 MG SL tablet DISSOLVE 1 TABLET UNDER THE TONGUE EVERY 5 MINUTES AS NEEDED FOR CHEST PAIN. DO NOT EXCEED A TOTAL OF 3 DOSES IN 15 MINUTES. 09/09/22   Branch, Alphonse Guild, MD  pantoprazole (PROTONIX) 40 MG tablet Take 1 tablet (40 mg total) by mouth daily. 08/30/20   Mariel Aloe, MD  potassium chloride SA (KLOR-CON M) 20 MEQ tablet TAKE 1 TABLET BY MOUTH DAILY  (TAKE ONLY WHEN FUROSEMIDE IS  TAKEN) 08/27/22   Arnoldo Lenis, MD  umeclidinium-vilanterol (ANORO ELLIPTA) 62.5-25 MCG/INH AEPB Inhale 1 puff into the lungs daily.    [provider]      Allergies    Lisinopril, Erythromycin, Levaquin [levofloxacin], Penicillins, and Sulfa antibiotics    Review of Systems   Review of Systems  Cardiovascular:  Positive for chest pain.  All other systems reviewed and are negative.   Physical Exam Updated Vital Signs Ht '4\' 7"'$  (1.397 m)   Wt 44.5 kg   BMI 22.78 kg/m  Physical Exam Vitals and nursing note reviewed.  Constitutional:      Appearance: She is well-developed.  HENT:     Head: Normocephalic.  Cardiovascular:  Rate and Rhythm: Normal rate and regular rhythm.     Heart sounds: Normal heart sounds.  Pulmonary:     Effort: Pulmonary effort is normal.     Breath sounds: Normal breath sounds.  Abdominal:     General: There is no distension.     Palpations: Abdomen is soft.  Musculoskeletal:        General: Normal range of motion.     Cervical back: Normal range of motion.  Skin:    General: Skin is warm.  Neurological:     General: No focal deficit present.     Mental Status: She is alert and oriented to person, place, and time.  Psychiatric:        Mood and Affect: Mood normal.     ED Results / Procedures / Treatments    Labs (all labs ordered are listed, but only abnormal results are displayed) Labs Reviewed  BASIC METABOLIC PANEL  CBC  TROPONIN I (HIGH SENSITIVITY)    EKG None  Radiology No results found.  Procedures Procedures    Medications Ordered in ED Medications - No data to display  ED Course/ Medical Decision Making/ A&P                           Medical Decision Making Orts that she had chest pain that lasted for approximately an hour pain resolved about 30 minutes after taking 3 sublingual nitroglycerin  Amount and/or Complexity of Data Reviewed External Data Reviewed: notes.    Details: Radiology notes reviewed patient is followed by Dr. Barron Alvine: ordered. Decision-making details documented in ED Course.    Details: Labs ordered reviewed and interpreted patient's first troponin is negative  Risk Risk Details: Patient's care turned over to oncoming provider Sallee Provencal, MD for final disposition           Final Clinical Impression(s) / ED Diagnoses Final diagnoses:  Chest pain, unspecified type    Rx / DC Orders ED Discharge Orders     None         Sidney Ace 10/02/22 1909    Fransico Meadow, MD 10/03/22 [phone]

## 2022-10-02 NOTE — Telephone Encounter (Signed)
Paged by Hannah Mckee about Hannah Mckee who presents with CP to Hannah Mckee with chest pain. This is her first episode of anginal type pain which lasted 40-45 minutes. She took SLNG x3 without significant improvement. Spontaneously resolved when EMS came without additional medications. ECG without evidence of new ischemia. Has known likely CAD with SPECT on 11/22/18 showing inferior/inferoseptal and septal infarction with peri-infarct ischemia. She was taken off ASA with ulcer seen 08/2020 on EGD. hsT were normal (6->6). I discussed options for management over the phone with daughter including staying for overnight observation and cardiology consult in the morning versus OP f/u versus low dose imdur. Since this is her first episode of possible anginal type pain the daughter didn't feel strongly about starting another medication like imdur. The daughter preferenced letting her go home given her back issues and frailty. I would preference anti anginal therapy over possible cath given her age and frailty either way. Will have Dr. Nelly Laurence office f/u for expedited appt. Daughter, patient and Dr. Philip Aspen are agreeable to the plan.

## 2022-10-02 NOTE — ED Triage Notes (Signed)
"  Patient states she started having chest pain at 1315 today and took 3 nitro and 2 baby asprin and pain had subsided by the time we arrived on scene. Denies shortness of breath. Denies n/v." Per EMS

## 2022-10-02 NOTE — ED Provider Notes (Signed)
  Physical Exam  BP (!) 131/56   Pulse 74   Temp 98.7 F (37.1 C) (Oral)   Resp 17   Ht '4\' 7"'$  (1.397 m)   Wt 44.5 kg   SpO2 95%   BMI 22.78 kg/m   Physical Exam  Procedures  Procedures  ED Course / MDM   Clinical Course as of 10/03/22 1233  Sat Oct 02, 2022  2225 Received signout from [place].  88 year old female with a history of suspected ischemic cardiomyopathy, COPD, CHF with recovered EF who presented to the emergency department with left-sided chest pain that lasted for approximately an hour and resolved after aspirin and nitroglycerin.  Initial EKG shows nonspecific intraventricular conduction delay. High-sensitivity troponins x2 have been WNL. Pt currently chest pain-free.  Does have an abnormal stress test that was done in 2020 that showed regional wall motion abnormalities that were concerning for ischemia but was medically managed due to concerns of the risk of PCI at that time.  Patient states that currently she would be open to PCI if it was indicated.  We will reach out to cardiology at this time to discuss with their team. [RP]  2244 Spoke with Dr Renella Cunas who will have discussion with family about possible options for the patient since she is a poor PCI candidate.  [RP]  2319 Dr Renella Cunas had extensive discussion with the family regarding the risks and benefits of hospitalization, PCI, and stress test as well as the need for medication changes.  Feels that at this time does not need any new medication changes and does not need to be started on aspirin again with her history of GI bleed.  He does not feel that additional stress testing at this time would be beneficial since it is known that she has regional wall motion abnormalities.  Given her age and comorbidities and prior work-up do not feel that there would be benefit from hospitalization at this time so will place ambulatory referral and have the patient follow-up with her cardiologist. [RP]    Clinical Course User  Index [RP] Fransico Meadow, MD   Medical Decision Making Amount and/or Complexity of Data Reviewed Labs: ordered.      Fransico Meadow, MD 10/03/22 1233

## 2022-10-02 NOTE — Discharge Instructions (Signed)
Today you were seen in the emergency department for your chest pain.  In the emergency department you had an EKG and troponins that were reassuring.  Your case was discussed with cardiology who would like for you to follow-up with him in clinic in 1 week and discuss if you need any new medications or additional testing.  Follow-up with your primary doctor in 2 to 3 days.  Return immediately to the emergency department if you experience any of the following: Severe chest pain, shortness of breath, or any other concerning symptoms.

## 2022-10-13 ENCOUNTER — Ambulatory Visit: Payer: Medicare Other | Attending: Cardiology | Admitting: Cardiology

## 2022-10-13 ENCOUNTER — Encounter: Payer: Self-pay | Admitting: Cardiology

## 2022-10-13 VITALS — BP 142/86 | HR 81 | Ht <= 58 in | Wt 95.0 lb

## 2022-10-13 DIAGNOSIS — R0789 Other chest pain: Secondary | ICD-10-CM

## 2022-10-13 MED ORDER — CLOPIDOGREL BISULFATE 75 MG PO TABS: 75.0000 mg | ORAL_TABLET | Freq: Every day | ORAL | 3 refills | Status: DC

## 2022-10-13 NOTE — Patient Instructions (Signed)
Medication Instructions:  Your physician has recommended you make the following change in your medication:  Start Plavix 75 mg tablets once daily.   Labwork: None  Testing/Procedures: None  Follow-Up: Follow up with Dr. Harl Bowie in 4 months- [facility].   Any Other Special Instructions Will Be Listed Below (If Applicable).     If you need a refill on your cardiac medications before your next appointment, please call your pharmacy.

## 2022-10-13 NOTE — Progress Notes (Signed)
Clinical Summary Hannah Mckee is a 88 y.o.female seen today for follow up of the following medical problems. This is a focused visit for recent ER visit with chest pain.       1. Abnoraml stress test/Chest pain Jan 2020 nuclear stress: Findings consistent with prior inferior/inferoseptal/septal myocardial infarction with moderate peri-infarct ischemia   - left rib pain from time to time laying at night, no specific cardiac chest pain.   - 10/02/22 ER visit with chest pain. Not better with NG - trops neg, EKG without acute ischemic changes - pain started while on phone. Pressing/pressure like, moderate in severity left sided. No other associated symptoms. Nothing positional. Lasted about 45 minutes, not better NG.  - no recurrent of pain.    Other medical issues not addressed this visit  1. Chronic diastolic HF - echo 65/0354 LVEF 35-40%. Family asked to defer ischemic testing at that time.  - f/u echo after medical therapy showed normalization of LVEF. 04/2017 echo LVEF 65-68%, grade I diastolic dysfunction. Mild AI.  - lisionpril caused rash.       09/2018 LVEF 12-75%, grade I diastolic dysfunction.        - some recent dizzy spells, typically with standing.  - has had poor appetite recently, decreased oral intake.  - no SOB/DOE, no LE edema - has been taking lasix '40mg'$  daily.   - edema overall controlled with lasix.      2. SOB - history of both COPD, CHF.  - no significant recent symptoms     3. COPD  - previously followed by Dr Luan Pulling     4. Ulcer - noted by 08/2020 EGD - now off ASA      6. HTN - compliant with meds   7. Vision loss - ongoign workup by pcp - she reports diagnosed with CVA, 01/2022 did show remote left PCA stroke.     Past Medical History:  Diagnosis Date   Acute blood loss anemia 08/27/2020   Cancer (HCC)    Skin, basal   CHF (congestive heart failure) (HCC)    Complication of anesthesia 1950's   'Years ago difficult  awaking up"   COPD (chronic obstructive pulmonary disease) ([place])    Coronary artery calcification seen on CT scan    Multivessel   Dyspnea    short of breath walking in home, breathing normal after sitting down   History of blood transfusion    History of cardiomyopathy    a. EF 35-40% by echo in 10/2016 b. improved to 55-60% by echo in 04/2017   History of kidney stones    History of shingles    Hypertension    Kyphosis    marked severity   Osteoarthritis    Pneumonia 10/04/2016   Salivary gland stone    Sepsis (HCC)      Allergies  Allergen Reactions   Lisinopril Hives   Erythromycin Swelling   Levaquin [Levofloxacin] Other (See Comments)    Tongue turns red and sore   Penicillins Swelling    Has patient had a PCN reaction causing immediate rash, facial/tongue/throat swelling, SOB or lightheadedness with hypotension: Yes Has patient had a PCN reaction causing severe rash involving mucus membranes or skin necrosis: No Has patient had a PCN reaction that required hospitalization No Has patient had a PCN reaction occurring within the last 10 years: No If all of the above answers are "NO", then may proceed with Cephalosporin use.    Sulfa  Antibiotics Other (See Comments)    Tongue turns red and sore     Current Outpatient Medications  Medication Sig Dispense Refill   acetaminophen (TYLENOL) 500 MG tablet Take 1,000 mg by mouth every 6 (six) hours as needed for moderate pain.     ALPRAZolam (XANAX) 0.25 MG tablet Take 0.025 mg by mouth See admin instructions. Take 1/2 tablet daily at bedtime     amLODipine (NORVASC) 5 MG tablet Take 1 tablet (5 mg total) by mouth daily. 90 tablet 2   aspirin EC 81 MG tablet Take 1 tablet (81 mg total) by mouth daily. Swallow whole. (Patient not taking: Reported on 10/02/2022) 30 tablet 12   feeding supplement, ENSURE ENLIVE, (ENSURE ENLIVE) LIQD Take 237 mLs by mouth 2 (two) times daily between meals. 237 mL 12   furosemide (LASIX) 20 MG  tablet Take 1 tablet (20 mg total) by mouth daily. May take an extra 20 mg as needed (Patient not taking: Reported on 10/02/2022) 45 tablet 6   furosemide (LASIX) 40 MG tablet Take 40 mg by mouth daily. Take 40 mg every day  may take an additional 20 mg if needed     ipratropium-albuterol (DUONEB) 0.5-2.5 (3) MG/3ML SOLN Take 3 mLs by nebulization every 4 (four) hours as needed.     Iron-Vitamins (S.S.S. TONIC) LIQD Take by mouth daily. 3 tablespoons daily     metoprolol succinate (TOPROL-XL) 100 MG 24 hr tablet TAKE ONE-HALF TABLET BY MOUTH IN THE MORNING AND AT BEDTIME WITH  OR IMMEDIATELY FOLLOWING A MEAL (Patient taking differently: 100 mg See admin instructions. TAKE ONE-HALF TABLET BY  MOUTH IN THE MORNING AND AT BEDTIME WITH OR IMMEDIATELY FOLLOWING A MEAL) 100 tablet 2   nitroGLYCERIN (NITROSTAT) 0.4 MG SL tablet DISSOLVE 1 TABLET UNDER THE TONGUE EVERY 5 MINUTES AS NEEDED FOR CHEST PAIN. DO NOT EXCEED A TOTAL OF 3 DOSES IN 15 MINUTES. (Patient taking differently: Place 0.4 mg under the tongue every 5 (five) minutes as needed for chest pain.) 25 tablet 0   pantoprazole (PROTONIX) 40 MG tablet Take 1 tablet (40 mg total) by mouth daily. 30 tablet 0   potassium chloride SA (KLOR-CON M) 20 MEQ tablet TAKE 1 TABLET BY MOUTH DAILY  (TAKE ONLY WHEN FUROSEMIDE IS  TAKEN) (Patient taking differently: Take 20 mEq by mouth See admin instructions. TAKE 1 TABLET BY MOUTH DAILY  (TAKE ONLY WHEN FUROSEMIDE IS  TAKEN)) 30 tablet 5   umeclidinium-vilanterol (ANORO ELLIPTA) 62.5-25 MCG/INH AEPB Inhale 1 puff into the lungs daily.     No current facility-administered medications for this visit.     Past Surgical History:  Procedure Laterality Date   BIOPSY  08/28/2020   Procedure: BIOPSY;  Surgeon: Daneil Dolin, MD;  Location: [facility];  Service: Endoscopy;;   BLADDER SURGERY     BUNIONECTOMY     CATARACT EXTRACTION Bilateral    CHOLECYSTECTOMY     COLONOSCOPY  11/16/2012   Procedure: COLONOSCOPY;   Surgeon: Rogene Houston, MD;  [facility]: [facility];  Service: Endoscopy;  Laterality: N/A;  100   ESOPHAGOGASTRODUODENOSCOPY (EGD) WITH PROPOFOL N/A 08/28/2020   Procedure: ESOPHAGOGASTRODUODENOSCOPY (EGD) WITH PROPOFOL;  Surgeon: Daneil Dolin, MD;  [facility]: [facility];  Service: Endoscopy;  Laterality: N/A;   LITHOTRIPSY     MASS EXCISION N/A 03/21/2020   Procedure: EXCISION ORAL MASS;  Surgeon: Leta Baptist, MD;  Location: [facility];  Service: ENT;  Laterality: N/A;   ORIF PERIPROSTHETIC FRACTURE  Right 10/29/2014   Procedure: OPEN REDUCTION INTERNAL FIXATION (ORIF) PERIPROSTHETIC FEMUR FRACTURE/FEMUR SHAFT;  Surgeon: Mcarthur Rossetti, MD;  Location: [facility];  Service: Orthopedics;  Laterality: Right;   TONSILLECTOMY     TOTAL HIP ARTHROPLASTY     1989     Allergies  Allergen Reactions   Lisinopril Hives   Erythromycin Swelling   Levaquin [Levofloxacin] Other (See Comments)    Tongue turns red and sore   Penicillins Swelling    Has patient had a PCN reaction causing immediate rash, facial/tongue/throat swelling, SOB or lightheadedness with hypotension: Yes Has patient had a PCN reaction causing severe rash involving mucus membranes or skin necrosis: No Has patient had a PCN reaction that required hospitalization No Has patient had a PCN reaction occurring within the last 10 years: No If all of the above answers are "NO", then may proceed with Cephalosporin use.    Sulfa Antibiotics Other (See Comments)    Tongue turns red and sore      Family History  Problem Relation Age of Onset   Hypertension Sister    Colon cancer Neg Hx      Social History Ms. Azizi reports that she quit smoking about 25 years ago. Her smoking use included cigarettes. She has a 17.50 pack-year smoking history. She has never used smokeless tobacco. Ms. Swiech reports no history of alcohol use.   Review of Systems CONSTITUTIONAL: No weight loss, fever, chills, weakness or fatigue.  HEENT:  Eyes: No visual loss, blurred vision, double vision or yellow sclerae.No hearing loss, sneezing, congestion, runny nose or sore throat.  SKIN: No rash or itching.  CARDIOVASCULAR: per hpi RESPIRATORY: No shortness of breath, cough or sputum.  GASTROINTESTINAL: No anorexia, nausea, vomiting or diarrhea. No abdominal pain or blood.  GENITOURINARY: No burning on urination, no polyuria NEUROLOGICAL: No headache, dizziness, syncope, paralysis, ataxia, numbness or tingling in the extremities. No change in bowel or bladder control.  MUSCULOSKELETAL: No muscle, back pain, joint pain or stiffness.  LYMPHATICS: No enlarged nodes. No history of splenectomy.  PSYCHIATRIC: No history of depression or anxiety.  ENDOCRINOLOGIC: No reports of sweating, cold or heat intolerance. No polyuria or polydipsia.  Hannah Mckee Kitchen   Physical Examination Today's Vitals   10/13/22 1434  BP: (!) 142/86  Pulse: 81  SpO2: 97%  Weight: 95 lb (43.1 kg)  Height: '4\' 6"'$  (1.372 m)   Body mass index is 22.91 kg/m.  Gen: resting comfortably, no acute distress HEENT: no scleral icterus, pupils equal round and reactive, no palptable cervical adenopathy,  CV: RRR, no m/r/g no jvd Resp: Clear to auscultation bilaterally GI: abdomen is soft, non-tender, non-distended, normal bowel sounds, no hepatosplenomegaly MSK: extremities are warm, no edema.  Skin: warm, no rash Neuro:  no focal deficits Psych: appropriate affect   Diagnostic Studies   Jan 2020 nuclear stress There was no ST segment deviation noted during stress. Findings consistent with prior inferior/inferoseptal/septal myocardial infarction with moderate peri-infarct ischemia. This is an intermediate risk study. The left ventricular ejection fraction is hyperdynamic (>65%  Assessment and Plan  1. Chest pain - Jan 2020 nuclear stress inferior infarct with moderate peri-infarct ischemia -managed medically  - recent ER visit with chest pain, unclear etiology. EKG and  enzymes benign - no recurrent symptoms. Continue to monitor patient. Advancd age very frail patient would be hesitant to consider cath. Could adjust antianginals if recurrent symptoms - ASA was stopped due to bleeding ulcer in 2021. With abnormal stress and recently remote CVA  noted by imaging would start plavix '75mg'$  daily         Arnoldo Lenis, M.D.

## 2022-11-06 ENCOUNTER — Other Ambulatory Visit: Payer: Self-pay | Admitting: Cardiology

## 2022-12-02 ENCOUNTER — Other Ambulatory Visit: Payer: Self-pay | Admitting: Cardiology

## 2022-12-18 ENCOUNTER — Other Ambulatory Visit: Payer: Self-pay | Admitting: Cardiology

## 2023-01-17 ENCOUNTER — Encounter: Payer: Self-pay | Admitting: Internal Medicine

## 2023-02-23 ENCOUNTER — Ambulatory Visit: Payer: Medicare Other | Admitting: Cardiology

## 2023-02-24 ENCOUNTER — Encounter: Payer: Self-pay | Admitting: Cardiology

## 2023-02-24 ENCOUNTER — Ambulatory Visit: Payer: Medicare Other | Attending: Cardiology | Admitting: Cardiology

## 2023-02-24 VITALS — BP 125/75 | HR 100 | Ht <= 58 in | Wt 92.0 lb

## 2023-02-24 DIAGNOSIS — R0789 Other chest pain: Secondary | ICD-10-CM | POA: Diagnosis not present

## 2023-02-24 DIAGNOSIS — I5032 Chronic diastolic (congestive) heart failure: Secondary | ICD-10-CM | POA: Diagnosis not present

## 2023-02-24 DIAGNOSIS — I10 Essential (primary) hypertension: Secondary | ICD-10-CM | POA: Diagnosis not present

## 2023-02-24 NOTE — Progress Notes (Signed)
Clinical Summary Hannah Mckee is a 89 y.o.female seen today for follow up of the following medical problems.   1. Abnoraml stress test/Chest pain Jan 2020 nuclear stress: Findings consistent with prior inferior/inferoseptal/septal myocardial infarction with moderate peri-infarct ischemia   - left rib pain from time to time laying at night, no specific cardiac chest pain.    - 10/02/22 ER visit with chest pain. Not better with NG - trops neg, EKG without acute ischemic changes - pain started while on phone. Pressing/pressure like, moderate in severity left sided. No other associated symptoms. Nothing positional. Lasted about 45 minutes, not better NG.  - no recurrent of pain.   - one episode of pins/needles feeling in left chest, no other associated symptoms.  - compliant with meds        2. Chronic diastolic HF - echo 10/2016 LVEF 35-40%. Family asked to defer ischemic testing at that time.  - f/u echo after medical therapy showed normalization of LVEF. 04/2017 echo LVEF 55-60%, grade I diastolic dysfunction. Mild AI.  - lisionpril caused rash.       09/2018 LVEF 55-60%, grade I diastolic dysfunction.      - taking lasix prn, sometimes will skip days. - occasional swelling in legs, SOB at times     2. COPD  - previously followed by Dr Juanetta Gosling     3. Ulcer - noted by 08/2020 EGD - now off ASA. We did start plavix due to prior CVA       4. HTN - she is compliant with meds   5. Vision loss - ongoign workup by pcp - she reports diagnosed with CVA, 01/2022 did show remote left PCA stroke. - given prior ulcer on ASA she is on plavix    Past Medical History:  Diagnosis Date   Acute blood loss anemia 08/27/2020   Cancer    Skin, basal   CHF (congestive heart failure)    Complication of anesthesia 1950's   'Years ago difficult awaking up"   COPD (chronic obstructive pulmonary disease)    Coronary artery calcification seen on CT scan    Multivessel   Dyspnea     short of breath walking in home, breathing normal after sitting down   History of blood transfusion    History of cardiomyopathy    a. EF 35-40% by echo in 10/2016 b. improved to 55-60% by echo in 04/2017   History of kidney stones    History of shingles    Hypertension    Kyphosis    marked severity   Osteoarthritis    Pneumonia 10/04/2016   Salivary gland stone    Sepsis      Allergies  Allergen Reactions   Lisinopril Hives   Erythromycin Swelling   Levaquin [Levofloxacin] Other (See Comments)    Tongue turns red and sore   Penicillins Swelling    Has patient had a PCN reaction causing immediate rash, facial/tongue/throat swelling, SOB or lightheadedness with hypotension: Yes Has patient had a PCN reaction causing severe rash involving mucus membranes or skin necrosis: No Has patient had a PCN reaction that required hospitalization No Has patient had a PCN reaction occurring within the last 10 years: No If all of the above answers are "NO", then may proceed with Cephalosporin use.    Sulfa Antibiotics Other (See Comments)    Tongue turns red and sore     Current Outpatient Medications  Medication Sig Dispense Refill   acetaminophen (TYLENOL)  500 MG tablet Take 1,000 mg by mouth every 6 (six) hours as needed for moderate pain.     ALPRAZolam (XANAX) 0.25 MG tablet Take 0.025 mg by mouth See admin instructions. Take 1/2 tablet daily at bedtime     amLODipine (NORVASC) 5 MG tablet TAKE 1 TABLET BY MOUTH DAILY 90 tablet 2   clopidogrel (PLAVIX) 75 MG tablet Take 1 tablet (75 mg total) by mouth daily. 90 tablet 3   feeding supplement, ENSURE ENLIVE, (ENSURE ENLIVE) LIQD Take 237 mLs by mouth 2 (two) times daily between meals. 237 mL 12   furosemide (LASIX) 20 MG tablet Take 1 tablet (20 mg total) by mouth daily. May take an extra 20 mg as needed 45 tablet 6   furosemide (LASIX) 40 MG tablet TAKE 1 AND 1/2 TABLETS BY MOUTH DAILY (MAY take an extra 1/2 tablet AS NEEDED FOR  SEVERE swelling) 135 tablet 1   ipratropium-albuterol (DUONEB) 0.5-2.5 (3) MG/3ML SOLN Take 3 mLs by nebulization every 4 (four) hours as needed.     Iron-Vitamins (S.S.S. TONIC) LIQD Take by mouth daily. 3 tablespoons daily     metoprolol succinate (TOPROL-XL) 100 MG 24 hr tablet TAKE ONE-HALF TABLET BY MOUTH IN THE MORNING AND AT BEDTIME WITH  OR IMMEDIATELY FOLLOWING A MEAL (Patient taking differently: 100 mg See admin instructions. TAKE ONE-HALF TABLET BY  MOUTH IN THE MORNING AND AT BEDTIME WITH OR IMMEDIATELY FOLLOWING A MEAL) 100 tablet 2   nitroGLYCERIN (NITROSTAT) 0.4 MG SL tablet DISSOLVE 1 TABLET UNDER THE TONGUE EVERY 5 MINUTES AS NEEDED FOR CHEST PAIN. DO NOT EXCEED A TOTAL OF 3 DOSES IN 15 MINUTES. (Patient taking differently: Place 0.4 mg under the tongue every 5 (five) minutes as needed for chest pain.) 25 tablet 0   pantoprazole (PROTONIX) 40 MG tablet Take 1 tablet (40 mg total) by mouth daily. 30 tablet 0   potassium chloride SA (KLOR-CON M) 20 MEQ tablet TAKE 1 TABLET BY MOUTH DAILY  (TAKE ONLY WHEN FUROSEMIDE IS  TAKEN) 100 tablet 2   umeclidinium-vilanterol (ANORO ELLIPTA) 62.5-25 MCG/INH AEPB Inhale 1 puff into the lungs daily.     No current facility-administered medications for this visit.     Past Surgical History:  Procedure Laterality Date   BIOPSY  08/28/2020   Procedure: BIOPSY;  Surgeon: Corbin Ade, MD;  [facility];  Service: Endoscopy;;   BLADDER SURGERY     BUNIONECTOMY     CATARACT EXTRACTION Bilateral    CHOLECYSTECTOMY     COLONOSCOPY  11/16/2012   Procedure: COLONOSCOPY;  Surgeon: Malissa Hippo, MD;  [facility];  Service: Endoscopy;  Laterality: N/A;  100   ESOPHAGOGASTRODUODENOSCOPY (EGD) WITH PROPOFOL N/A 08/28/2020   Procedure: ESOPHAGOGASTRODUODENOSCOPY (EGD) WITH PROPOFOL;  Surgeon: Corbin Ade, MD;  [facility];  Service: Endoscopy;  Laterality: N/A;   LITHOTRIPSY     MASS EXCISION N/A 03/21/2020    Procedure: EXCISION ORAL MASS;  Surgeon: Newman Pies, MD;  Location: [facility];  Service: ENT;  Laterality: N/A;   ORIF PERIPROSTHETIC FRACTURE Right 10/29/2014   Procedure: OPEN REDUCTION INTERNAL FIXATION (ORIF) PERIPROSTHETIC FEMUR FRACTURE/FEMUR SHAFT;  Surgeon: Kathryne Hitch, MD;  Location: [facility];  Service: Orthopedics;  Laterality: Right;   TONSILLECTOMY     TOTAL HIP ARTHROPLASTY     1989     Allergies  Allergen Reactions   Lisinopril Hives   Erythromycin Swelling   Levaquin [Levofloxacin] Other (See Comments)  Tongue turns red and sore   Penicillins Swelling    Has patient had a PCN reaction causing immediate rash, facial/tongue/throat swelling, SOB or lightheadedness with hypotension: Yes Has patient had a PCN reaction causing severe rash involving mucus membranes or skin necrosis: No Has patient had a PCN reaction that required hospitalization No Has patient had a PCN reaction occurring within the last 10 years: No If all of the above answers are "NO", then may proceed with Cephalosporin use.    Sulfa Antibiotics Other (See Comments)    Tongue turns red and sore      Family History  Problem Relation Age of Onset   Hypertension Sister    Colon cancer Neg Hx      Social History Hannah Mckee reports that she quit smoking about 26 years ago. Her smoking use included cigarettes. She has a 17.50 pack-year smoking history. She has never used smokeless tobacco. Hannah Mckee reports no history of alcohol use.   Review of Systems CONSTITUTIONAL: No weight loss, fever, chills, weakness or fatigue.  HEENT: Eyes: No visual loss, blurred vision, double vision or yellow sclerae.No hearing loss, sneezing, congestion, runny nose or sore throat.  SKIN: No rash or itching.  CARDIOVASCULAR: per hpi RESPIRATORY: per hpi GASTROINTESTINAL: No anorexia, nausea, vomiting or diarrhea. No abdominal pain or blood.  GENITOURINARY: No burning on urination, no polyuria NEUROLOGICAL: No  headache, dizziness, syncope, paralysis, ataxia, numbness or tingling in the extremities. No change in bowel or bladder control.  MUSCULOSKELETAL: No muscle, back pain, joint pain or stiffness.  LYMPHATICS: No enlarged nodes. No history of splenectomy.  PSYCHIATRIC: No history of depression or anxiety.  ENDOCRINOLOGIC: No reports of sweating, cold or heat intolerance. No polyuria or polydipsia.  Marland Kitchen   Physical Examination Today's Vitals   02/24/23 1053 02/24/23 1130  BP: (!) 140/64 125/75  Pulse: 100   SpO2: 95%   Weight: 92 lb (41.7 kg)   Height:  (1.372 m)    Body mass index is 22.18 kg/m.  Gen: resting comfortably, no acute distress HEENT: no scleral icterus, pupils equal round and reactive, no palptable cervical adenopathy,  CV: RRR, no mrg, no jvd Resp: faint bilateral crackles GI: abdomen is soft, non-tender, non-distended, normal bowel sounds, no hepatosplenomegaly MSK: 1+ bilateral edema Skin: warm, no rash Neuro:  no focal deficits Psych: appropriate affect   Diagnostic Studies  Jan 2020 nuclear stress There was no ST segment deviation noted during stress. Findings consistent with prior inferior/inferoseptal/septal myocardial infarction with moderate peri-infarct ischemia. This is an intermediate risk study. The left ventricular ejection fraction is hyperdynamic (>65%   Assessment and Plan   1. Chest pain - Jan 2020 nuclear stress inferior infarct with moderate peri-infarct ischemia -managed medically  - no recent symptoms, continue to monitor  2. Chronic HFpEF - mild volume overload today, recommended more consistent diuretic use. We discussed can take additional diuretic as needed   3. HTN - at goal, continue curren tmeds    Antoine Poche, M.D.

## 2023-02-24 NOTE — Patient Instructions (Addendum)
Medication Instructions:  Continue all current medications.   Labwork: none  Testing/Procedures: none  Follow-Up: 6 months   Any Other Special Instructions Will Be Listed Below (If Applicable).   If you need a refill on your cardiac medications before your next appointment, please call your pharmacy.  

## 2023-04-26 ENCOUNTER — Ambulatory Visit: Payer: Medicare Other | Admitting: Neurology

## 2023-05-05 ENCOUNTER — Other Ambulatory Visit: Payer: Self-pay | Admitting: Cardiology

## 2023-06-08 ENCOUNTER — Ambulatory Visit: Payer: Medicare Other | Admitting: Neurology

## 2023-07-13 DIAGNOSIS — R809 Proteinuria, unspecified: Secondary | ICD-10-CM | POA: Diagnosis not present

## 2023-07-13 DIAGNOSIS — D509 Iron deficiency anemia, unspecified: Secondary | ICD-10-CM | POA: Diagnosis not present

## 2023-07-13 DIAGNOSIS — I5032 Chronic diastolic (congestive) heart failure: Secondary | ICD-10-CM | POA: Diagnosis not present

## 2023-07-19 DIAGNOSIS — I5032 Chronic diastolic (congestive) heart failure: Secondary | ICD-10-CM | POA: Diagnosis not present

## 2023-07-19 DIAGNOSIS — I2510 Atherosclerotic heart disease of native coronary artery without angina pectoris: Secondary | ICD-10-CM | POA: Diagnosis not present

## 2023-07-19 DIAGNOSIS — D509 Iron deficiency anemia, unspecified: Secondary | ICD-10-CM | POA: Diagnosis not present

## 2023-07-19 DIAGNOSIS — I110 Hypertensive heart disease with heart failure: Secondary | ICD-10-CM | POA: Diagnosis not present

## 2023-07-19 DIAGNOSIS — Z8673 Personal history of transient ischemic attack (TIA), and cerebral infarction without residual deficits: Secondary | ICD-10-CM | POA: Diagnosis not present

## 2023-07-19 DIAGNOSIS — F5101 Primary insomnia: Secondary | ICD-10-CM | POA: Diagnosis not present

## 2023-07-19 DIAGNOSIS — M6281 Muscle weakness (generalized): Secondary | ICD-10-CM | POA: Diagnosis not present

## 2023-07-19 DIAGNOSIS — I10 Essential (primary) hypertension: Secondary | ICD-10-CM | POA: Diagnosis not present

## 2023-07-19 DIAGNOSIS — M459 Ankylosing spondylitis of unspecified sites in spine: Secondary | ICD-10-CM | POA: Diagnosis not present

## 2023-07-19 DIAGNOSIS — M40209 Unspecified kyphosis, site unspecified: Secondary | ICD-10-CM | POA: Diagnosis not present

## 2023-07-19 DIAGNOSIS — J449 Chronic obstructive pulmonary disease, unspecified: Secondary | ICD-10-CM | POA: Diagnosis not present

## 2023-07-20 ENCOUNTER — Other Ambulatory Visit: Payer: Self-pay | Admitting: Cardiology

## 2023-07-25 ENCOUNTER — Other Ambulatory Visit: Payer: Self-pay | Admitting: Cardiology

## 2023-08-09 ENCOUNTER — Encounter: Payer: Self-pay | Admitting: Cardiology

## 2023-08-09 ENCOUNTER — Ambulatory Visit: Payer: Medicare Other | Attending: Cardiology | Admitting: Cardiology

## 2023-08-09 VITALS — BP 134/58 | HR 80 | Ht <= 58 in | Wt 92.4 lb

## 2023-08-09 DIAGNOSIS — I5033 Acute on chronic diastolic (congestive) heart failure: Secondary | ICD-10-CM

## 2023-08-09 DIAGNOSIS — R0789 Other chest pain: Secondary | ICD-10-CM

## 2023-08-09 DIAGNOSIS — I10 Essential (primary) hypertension: Secondary | ICD-10-CM

## 2023-08-09 DIAGNOSIS — Z79899 Other long term (current) drug therapy: Secondary | ICD-10-CM

## 2023-08-09 MED ORDER — FUROSEMIDE 40 MG PO TABS: 40.0000 mg | ORAL_TABLET | Freq: Every day | ORAL | 1 refills | Status: DC

## 2023-08-09 MED ORDER — FUROSEMIDE 20 MG PO TABS: 20.0000 mg | ORAL_TABLET | Freq: Every day | ORAL | 1 refills | Status: DC

## 2023-08-09 NOTE — Progress Notes (Signed)
Clinical Summary Ms. Sharpnack is a 89 y.o.female seen today for follow up of the following medical problems.    1. Abnoraml stress test/Chest pain Jan 2020 nuclear stress: Findings consistent with prior inferior/inferoseptal/septal myocardial infarction with moderate peri-infarct ischemia   - 10/02/22 ER visit with chest pain. Not better with NG - trops neg, EKG without acute ischemic changes       - no recent chest pains. Some SOB at times, better inhalers  2. Chronic diastolic HF - echo 10/2016 LVEF 35-40%. Family asked to defer ischemic testing at that time.  - f/u echo after medical therapy showed normalization of LVEF. 04/2017 echo LVEF 55-60%, grade I diastolic dysfunction. Mild AI.  - lisionpril caused rash.     09/2018 LVEF 55-60%, grade I diastolic dysfunction.      - some LE edema that has increased - taking lasix 40mg  once daily.      2. COPD  - previously followed by Dr Juanetta Gosling     3. Ulcer - noted by 08/2020 EGD - now off ASA. We did start plavix due to prior CVA     4. HTN - she is compliant with meds   5. Vision loss - ongoign workup by pcp - she reports diagnosed with CVA, 01/2022 did show remote left PCA stroke. - given prior ulcer on ASA she is on plavix  Past Medical History:  Diagnosis Date   Acute blood loss anemia 08/27/2020   Cancer (HCC)    Skin, basal   CHF (congestive heart failure) (HCC)    Complication of anesthesia 1950's   'Years ago difficult awaking up"   COPD (chronic obstructive pulmonary disease) (HCC)    Coronary artery calcification seen on CT scan    Multivessel   Dyspnea    short of breath walking in home, breathing normal after sitting down   History of blood transfusion    History of cardiomyopathy    a. EF 35-40% by echo in 10/2016 b. improved to 55-60% by echo in 04/2017   History of kidney stones    History of shingles    Hypertension    Kyphosis    marked severity   Osteoarthritis    Pneumonia  10/04/2016   Salivary gland stone    Sepsis (HCC)      Allergies  Allergen Reactions   Lisinopril Hives   Erythromycin Swelling   Levaquin [Levofloxacin] Other (See Comments)    Tongue turns red and sore   Penicillins Swelling    Has patient had a PCN reaction causing immediate rash, facial/tongue/throat swelling, SOB or lightheadedness with hypotension: Yes Has patient had a PCN reaction causing severe rash involving mucus membranes or skin necrosis: No Has patient had a PCN reaction that required hospitalization No Has patient had a PCN reaction occurring within the last 10 years: No If all of the above answers are "NO", then may proceed with Cephalosporin use.    Sulfa Antibiotics Other (See Comments)    Tongue turns red and sore     Current Outpatient Medications  Medication Sig Dispense Refill   acetaminophen (TYLENOL) 500 MG tablet Take 1,000 mg by mouth every 6 (six) hours as needed for moderate pain.     ALPRAZolam (XANAX) 0.25 MG tablet Take 0.025 mg by mouth See admin instructions. Take 1/2 tablet daily at bedtime     amLODipine (NORVASC) 5 MG tablet TAKE 1 TABLET BY MOUTH DAILY 90 tablet 2   clopidogrel (PLAVIX) 75  MG tablet Take 1 tablet (75 mg total) by mouth daily. 90 tablet 3   feeding supplement, ENSURE ENLIVE, (ENSURE ENLIVE) LIQD Take 237 mLs by mouth 2 (two) times daily between meals. 237 mL 12   ipratropium-albuterol (DUONEB) 0.5-2.5 (3) MG/3ML SOLN Take 3 mLs by nebulization every 4 (four) hours as needed.     Iron-Vitamins (S.S.S. TONIC) LIQD Take by mouth daily. 3 tablespoons daily     metoprolol succinate (TOPROL-XL) 100 MG 24 hr tablet TAKE ONE-HALF TABLET BY MOUTH IN THE MORNING AND AT BEDTIME WITH  OR IMMEDIATELY FOLLOWING A MEAL 100 tablet 2   nitroGLYCERIN (NITROSTAT) 0.4 MG SL tablet DISSOLVE 1 TABLET UNDER THE TONGUE EVERY 5 MINUTES AS NEEDED FOR CHEST PAIN. DO NOT EXCEED A TOTAL OF 3 DOSES IN 15 MINUTES. (Patient taking differently: Place 0.4 mg under  the tongue every 5 (five) minutes as needed for chest pain.) 25 tablet 0   pantoprazole (PROTONIX) 40 MG tablet Take 1 tablet (40 mg total) by mouth daily. 30 tablet 0   potassium chloride SA (KLOR-CON M) 20 MEQ tablet TAKE 1 TABLET BY MOUTH DAILY  (TAKE ONLY WHEN FUROSEMIDE IS  TAKEN) 100 tablet 2   umeclidinium-vilanterol (ANORO ELLIPTA) 62.5-25 MCG/INH AEPB Inhale 1 puff into the lungs daily.     furosemide (LASIX) 20 MG tablet Take 1 tablet (20 mg total) by mouth daily. (To be taken with 40mg  tab to equal 60mg  total for the day) 90 tablet 1   furosemide (LASIX) 40 MG tablet Take 1 tablet (40 mg total) by mouth daily. (To be taken with 20mg  tab to equal 60mg  total for the day) 90 tablet 1   No current facility-administered medications for this visit.     Past Surgical History:  Procedure Laterality Date   BIOPSY  08/28/2020   Procedure: BIOPSY;  Surgeon: Corbin Ade, MD;  Location: [facility];  Service: Endoscopy;;   BLADDER SURGERY     BUNIONECTOMY     CATARACT EXTRACTION Bilateral    CHOLECYSTECTOMY     COLONOSCOPY  11/16/2012   Procedure: COLONOSCOPY;  Surgeon: Malissa Hippo, MD;  Location: [facility];  Service: Endoscopy;  Laterality: N/A;  100   ESOPHAGOGASTRODUODENOSCOPY (EGD) WITH PROPOFOL N/A 08/28/2020   Procedure: ESOPHAGOGASTRODUODENOSCOPY (EGD) WITH PROPOFOL;  Surgeon: Corbin Ade, MD;  Location: [facility];  Service: Endoscopy;  Laterality: N/A;   LITHOTRIPSY     MASS EXCISION N/A 03/21/2020   Procedure: EXCISION ORAL MASS;  Surgeon: Newman Pies, MD;  Location[facility] OR;  Service: ENT;  Laterality: N/A;   ORIF PERIPROSTHETIC FRACTURE Right 10/29/2014   Procedure: OPEN REDUCTION INTERNAL FIXATION (ORIF) PERIPROSTHETIC FEMUR FRACTURE/FEMUR SHAFT;  Surgeon: Kathryne Hitch, MD;  Location: [facility] OR;  Service: Orthopedics;  Laterality: Right;   TONSILLECTOMY     TOTAL HIP ARTHROPLASTY     1989     Allergies  Allergen Reactions   Lisinopril Hives    Erythromycin Swelling   Levaquin [Levofloxacin] Other (See Comments)    Tongue turns red and sore   Penicillins Swelling    Has patient had a PCN reaction causing immediate rash, facial/tongue/throat swelling, SOB or lightheadedness with hypotension: Yes Has patient had a PCN reaction causing severe rash involving mucus membranes or skin necrosis: No Has patient had a PCN reaction that required hospitalization No Has patient had a PCN reaction occurring within the last 10 years: No If all of the above answers are "NO", then may proceed with  Cephalosporin use.    Sulfa Antibiotics Other (See Comments)    Tongue turns red and sore      Family History  Problem Relation Age of Onset   Hypertension Sister    Colon cancer Neg Hx      Social History Ms. Schubert reports that she quit smoking about 26 years ago. Her smoking use included cigarettes. She started smoking about 61 years ago. She has a 17.5 pack-year smoking history. She has never used smokeless tobacco. Ms. Hutto reports no history of alcohol use.   Review of Systems CONSTITUTIONAL: No weight loss, fever, chills, weakness or fatigue.  HEENT: Eyes: No visual loss, blurred vision, double vision or yellow sclerae.No hearing loss, sneezing, congestion, runny nose or sore throat.  SKIN: No rash or itching.  CARDIOVASCULAR: per hpi RESPIRATORY: No shortness of breath, cough or sputum.  GASTROINTESTINAL: No anorexia, nausea, vomiting or diarrhea. No abdominal pain or blood.  GENITOURINARY: No burning on urination, no polyuria NEUROLOGICAL: No headache, dizziness, syncope, paralysis, ataxia, numbness or tingling in the extremities. No change in bowel or bladder control.  MUSCULOSKELETAL: No muscle, back pain, joint pain or stiffness.  LYMPHATICS: No enlarged nodes. No history of splenectomy.  PSYCHIATRIC: No history of depression or anxiety.  ENDOCRINOLOGIC: No reports of sweating, cold or heat intolerance. No polyuria or  polydipsia.  Marland Kitchen   Physical Examination Today's Vitals   08/09/23 1141 08/09/23 1220  BP: 138/78 (!) 134/58  Pulse: 80   SpO2: 96%   Weight: 92 lb 6.4 oz (41.9 kg)   Height: 4\' 5"  (1.346 m)    Body mass index is 23.13 kg/m.  Gen: resting comfortably, no acute distress HEENT: no scleral icterus, pupils equal round and reactive, no palptable cervical adenopathy,  CV: RRR, no mrg, no jvd Resp: faint bilateral crackes GI: abdomen is soft, non-tender, non-distended, normal bowel sounds, no hepatosplenomegaly MSK: extremities are warm, +bilateral LE edema Skin: warm, no rash Neuro:  no focal deficits Psych: appropriate affect   Diagnostic Studies  Jan 2020 nuclear stress There was no ST segment deviation noted during stress. Findings consistent with prior inferior/inferoseptal/septal myocardial infarction with moderate peri-infarct ischemia. This is an intermediate risk study. The left ventricular ejection fraction is hyperdynamic (>65%   Assessment and Plan   1. Chest pain - Jan 2020 nuclear stress inferior infarct with moderate peri-infarct ischemia -managed medically  - denies any recent symptoms   2. Acute on Chronic HFpEF - increased edema today, will increase her lasix to 60mg  daily. Check bmet/mg/bnp in 2 weeks.    3. HTN - overall at goal, particularly given age.  - continue current meds  F/u 2 months reassess volume status.      Antoine Poche, M.D.

## 2023-08-09 NOTE — Patient Instructions (Addendum)
Medication Instructions:   Increase Furosemide to 60mg  daily (40mg  & 20mg  tablet given at patient request) Continue all other medications.     Labwork:  BMET, BNP, Mg - orders given  Please do in 2 weeks Office will contact with results via phone, letter or mychart.     Testing/Procedures:  none  Follow-Up:  2 months   Any Other Special Instructions Will Be Listed Below (If Applicable).   If you need a refill on your cardiac medications before your next appointment, please call your pharmacy.

## 2023-08-29 ENCOUNTER — Other Ambulatory Visit: Payer: Self-pay | Admitting: Cardiology

## 2023-08-30 DIAGNOSIS — I5033 Acute on chronic diastolic (congestive) heart failure: Secondary | ICD-10-CM | POA: Diagnosis not present

## 2023-08-30 DIAGNOSIS — Z79899 Other long term (current) drug therapy: Secondary | ICD-10-CM | POA: Diagnosis not present

## 2023-08-30 DIAGNOSIS — I10 Essential (primary) hypertension: Secondary | ICD-10-CM | POA: Diagnosis not present

## 2023-09-01 LAB — BASIC METABOLIC PANEL
BUN/Creatinine Ratio: 15 (ref 12–28)
BUN: 10 mg/dL (ref 10–36)
CO2: 26 mmol/L (ref 20–29)
Calcium: 9.4 mg/dL (ref 8.7–10.3)
Chloride: 103 mmol/L (ref 96–106)
Creatinine, Ser: 0.65 mg/dL (ref 0.57–1.00)
Glucose: 116 mg/dL — ABNORMAL HIGH (ref 70–99)
Potassium: 3.7 mmol/L (ref 3.5–5.2)
Sodium: 143 mmol/L (ref 134–144)
eGFR: 84 mL/min/{1.73_m2} (ref >59)

## 2023-09-01 LAB — MAGNESIUM: Magnesium: 1.8 mg/dL (ref 1.6–2.3)

## 2023-09-01 LAB — BRAIN NATRIURETIC PEPTIDE: BNP: 151 pg/mL — ABNORMAL HIGH (ref 0.0–100.0)

## 2023-09-19 ENCOUNTER — Telehealth: Payer: Self-pay | Admitting: Cardiology

## 2023-09-19 NOTE — Telephone Encounter (Signed)
Follow Up:    Patient would like her last lab results please.

## 2023-09-20 NOTE — Telephone Encounter (Signed)
[name], LPN 16/08/9603  4:16 PM EST Back to Top    Notified daughter Leanora Ivanoff), copy to pcp.   Antoine Poche, MD 09/20/2023  3:45 PM EST     Labs overall looked good, suggested still had some extra fluid on board but the higher dose of lasix should help that resolve   Dominga Ferry MD

## 2023-10-01 ENCOUNTER — Other Ambulatory Visit: Payer: Self-pay | Admitting: Cardiology

## 2023-10-10 ENCOUNTER — Ambulatory Visit: Payer: Medicare Other | Admitting: Nurse Practitioner

## 2023-10-13 DIAGNOSIS — H35033 Hypertensive retinopathy, bilateral: Secondary | ICD-10-CM | POA: Diagnosis not present

## 2023-10-13 DIAGNOSIS — H524 Presbyopia: Secondary | ICD-10-CM | POA: Diagnosis not present

## 2023-11-09 ENCOUNTER — Ambulatory Visit: Payer: Medicare Other | Admitting: Nurse Practitioner

## 2024-01-16 DIAGNOSIS — R809 Proteinuria, unspecified: Secondary | ICD-10-CM | POA: Diagnosis not present

## 2024-01-16 DIAGNOSIS — D509 Iron deficiency anemia, unspecified: Secondary | ICD-10-CM | POA: Diagnosis not present

## 2024-01-16 DIAGNOSIS — I5032 Chronic diastolic (congestive) heart failure: Secondary | ICD-10-CM | POA: Diagnosis not present

## 2024-01-17 ENCOUNTER — Other Ambulatory Visit: Payer: Self-pay | Admitting: Cardiology

## 2024-01-23 DIAGNOSIS — M6281 Muscle weakness (generalized): Secondary | ICD-10-CM | POA: Diagnosis not present

## 2024-01-23 DIAGNOSIS — J449 Chronic obstructive pulmonary disease, unspecified: Secondary | ICD-10-CM | POA: Diagnosis not present

## 2024-01-23 DIAGNOSIS — M542 Cervicalgia: Secondary | ICD-10-CM | POA: Diagnosis not present

## 2024-01-23 DIAGNOSIS — M40209 Unspecified kyphosis, site unspecified: Secondary | ICD-10-CM | POA: Diagnosis not present

## 2024-01-23 DIAGNOSIS — K219 Gastro-esophageal reflux disease without esophagitis: Secondary | ICD-10-CM | POA: Diagnosis not present

## 2024-01-23 DIAGNOSIS — I110 Hypertensive heart disease with heart failure: Secondary | ICD-10-CM | POA: Diagnosis not present

## 2024-01-23 DIAGNOSIS — I10 Essential (primary) hypertension: Secondary | ICD-10-CM | POA: Diagnosis not present

## 2024-01-23 DIAGNOSIS — M459 Ankylosing spondylitis of unspecified sites in spine: Secondary | ICD-10-CM | POA: Diagnosis not present

## 2024-01-23 DIAGNOSIS — Z8673 Personal history of transient ischemic attack (TIA), and cerebral infarction without residual deficits: Secondary | ICD-10-CM | POA: Diagnosis not present

## 2024-01-23 DIAGNOSIS — I5032 Chronic diastolic (congestive) heart failure: Secondary | ICD-10-CM | POA: Diagnosis not present

## 2024-01-23 DIAGNOSIS — M450 Ankylosing spondylitis of multiple sites in spine: Secondary | ICD-10-CM | POA: Diagnosis not present

## 2024-01-23 DIAGNOSIS — I2510 Atherosclerotic heart disease of native coronary artery without angina pectoris: Secondary | ICD-10-CM | POA: Diagnosis not present

## 2024-02-05 ENCOUNTER — Other Ambulatory Visit: Payer: Self-pay | Admitting: Cardiology

## 2024-02-13 ENCOUNTER — Other Ambulatory Visit: Payer: Self-pay | Admitting: Cardiology

## 2024-02-19 ENCOUNTER — Other Ambulatory Visit: Payer: Self-pay | Admitting: Cardiology

## 2024-03-06 ENCOUNTER — Telehealth: Payer: Self-pay | Admitting: Cardiology

## 2024-03-06 MED ORDER — AMLODIPINE BESYLATE 5 MG PO TABS: 5.0000 mg | ORAL_TABLET | Freq: Every day | ORAL | 0 refills | Status: DC

## 2024-03-06 NOTE — Telephone Encounter (Signed)
 Done

## 2024-03-06 NOTE — Telephone Encounter (Signed)
*  STAT* If patient is at the pharmacy, call can be transferred to refill team.   1. Which medications need to be refilled? (please list name of each medication and dose if known)   amLODipine  (NORVASC ) 5 MG tablet    4. Which pharmacy/location (including street and city if local pharmacy) is medication to be sent to?   [facility] - [place], [place] - [address] Phone: [phone]  Fax: [phone]    5. Do they need a 30 day or 90 day supply? 90

## 2024-03-07 ENCOUNTER — Telehealth: Payer: Self-pay | Admitting: Cardiology

## 2024-03-07 NOTE — Telephone Encounter (Signed)
 Please send rx to [facility] [place], [place] - [address]

## 2024-03-07 NOTE — Telephone Encounter (Signed)
*  STAT* If patient is at the pharmacy, call can be transferred to refill team.   1. Which medications need to be refilled? (please list name of each medication and dose if known) clopidogrel  (PLAVIX ) 75 MG tablet  furosemide  (LASIX ) 40 MG tablet  furosemide  (LASIX ) 20 MG tablet  potassium chloride  SA (KLOR-CON  M) 20 MEQ tablet  2. Which pharmacy/location (including street and city if local pharmacy) is medication to be sent to? [facility] - [place], [place] - [address]    3. Do they need a 30 day or 90 day supply? 90

## 2024-03-07 NOTE — Telephone Encounter (Signed)
*  STAT* If patient is at the pharmacy, call can be transferred to refill team.   1. Which medications need to be refilled? (please list name of each medication and dose if known) metoprolol  succinate (TOPROL -XL) 100 MG 24 hr tablet TAKE ONE-HALF TABLET BY MOUTH IN THE MORNING AND AT BEDTIME WITH OR IMMEDIATELY FOLLOWING A MEAL   2. Would you like to learn more about the convenience, safety, & potential cost savings by using the [facility]? No   3. Are you open to using the [facility] No   4. Which pharmacy/location (including street and city if local pharmacy) is medication to be sent to? [facility] - [place], [place] - [address]    5. Do they need a 30 day or 90 day supply? 90 Day Supply

## 2024-03-08 MED ORDER — FUROSEMIDE 20 MG PO TABS: ORAL_TABLET | ORAL | 1 refills | Status: DC

## 2024-03-08 MED ORDER — METOPROLOL SUCCINATE ER 100 MG PO TB24: ORAL_TABLET | ORAL | 1 refills | Status: DC

## 2024-03-08 MED ORDER — CLOPIDOGREL BISULFATE 75 MG PO TABS: 75.0000 mg | ORAL_TABLET | Freq: Every day | ORAL | 1 refills | Status: DC

## 2024-03-08 MED ORDER — POTASSIUM CHLORIDE CRYS ER 20 MEQ PO TBCR: EXTENDED_RELEASE_TABLET | ORAL | 1 refills | Status: DC

## 2024-03-08 MED ORDER — FUROSEMIDE 40 MG PO TABS: 40.0000 mg | ORAL_TABLET | Freq: Every day | ORAL | 1 refills | Status: DC

## 2024-03-08 NOTE — Telephone Encounter (Signed)
 Pt's medications were sent to pt's pharmacy as requested. Confirmation received.

## 2024-03-08 NOTE — Telephone Encounter (Signed)
 Pt's medication was sent to pt's pharmacy as requested. Confirmation received.

## 2024-03-27 ENCOUNTER — Other Ambulatory Visit: Payer: Self-pay | Admitting: Cardiology

## 2024-04-03 ENCOUNTER — Other Ambulatory Visit: Payer: Self-pay | Admitting: Cardiology

## 2024-06-01 ENCOUNTER — Other Ambulatory Visit: Payer: Self-pay | Admitting: Cardiology

## 2024-07-02 ENCOUNTER — Other Ambulatory Visit: Payer: Self-pay | Admitting: Cardiology

## 2024-07-10 ENCOUNTER — Telehealth: Payer: Self-pay | Admitting: Cardiology

## 2024-07-10 NOTE — Telephone Encounter (Signed)
 Called patient's daughter (DPR) back about message. Patient complaining of BLE edema for 6 weeks that continues to get worse. Edema is 10 inches above the ankles both legs, right worse (surgery history on right). Patient is currently taking lasix  60 mg daily, that is not helping. Patient's daughter thinks it might be a side effect from the amlodipine . Patient has been elevating her legs, but edema is not going down. Will have patient see DOD on Thursday to get evaluated. Patient denies any SOB or Chest pain. Will forward to Dr. Alvan for further advisement.

## 2024-07-10 NOTE — Telephone Encounter (Signed)
 Pt c/o swelling/edema: STAT if pt has developed SOB within 24 hours  If swelling, where is the swelling located?   Lower legs, ankle and feet and getting progressively worse over the last 6 weeks  How much weight have you gained and in what time span?   No  Have you gained 2 pounds in a day or 5 pounds in a week?   No  Do you have a log of your daily weights (if so, list)?   Yes  Are you currently taking a fluid pill?   Yes  Are you currently SOB?   No  Have you traveled recently in a car or plane for an extended period of time?   No  Daughter Mel) stated patient's appetite has decreased and has been losing weight.  Daughter stated patient may need a medication change as patient is consistently having swellings which is not going down even in the morning with the patient sleeping with her legs elevated.

## 2024-07-11 ENCOUNTER — Encounter: Payer: Self-pay | Admitting: Cardiology

## 2024-07-11 NOTE — Progress Notes (Unsigned)
 Cardiology Office Note:   Date:  07/12/2024  ID:  Hannah Mckee, DOB [birth year], MRN [id number] PCP: Shona Norleen PEDLAR, MD  [facility] Providers Cardiologist:  Alvan Carrier, MD {  History of Present Illness:   Hannah Mckee is a 89 y.o. female she has a history of coronary calcium . She previously saw Dr. Carrier Alvan.  He saw her last in October of last year.  She had a previous abnormal stress test in 2020 consistent with prior inferior and inferoseptal and septal myocardial infarction with some peri-infarct ischemia.  She had chronic systolic and diastolic heart failure with an EF of 2017 of 35 to 40% with subsequently improved to 55 to 60%.  She was managed medically for presumed coronary disease.  She has been managed for heart failure with preserved ejection fraction and hypertension.  She called to be added to the schedule Because she has had increased lower extremity swelling.  She lives in [place].  She could not get in up there.  She said for 6 to 8 months she has had increased swelling but is gotten really worse over at least 3 to 4 weeks.  She has to sleep in her recliner because she has such bad lordosis.  Her head is up and her feet kind of straight out.  She cannot wear compression stockings because she cannot put them on.  She has been taking 40 mg of Lasix  with an extra 20 and for the most part she is taking the 60 mg every day.  She does not necessarily think the weights gone up but her swelling has gotten worse.  She is not having any new shortness of breath, PND or orthopnea.  She is not having any new chest pressure, neck or arm discomfort.  Her last medication change was an increase in her amlodipine  for blood pressure control.  ROS: As stated in the HPI and negative for all other systems.  Studies Reviewed:    EKG:   EKG Interpretation Date/Time:  Thursday July 12 2024 11:54:20 EDT Ventricular Rate:  82 PR Interval:  158 QRS Duration:  126 QT  Interval:  408 QTC Calculation: 476 R Axis:   165  Text Interpretation: Normal sinus rhythm Right axis deviation Non-specific intra-ventricular conduction block When compared with ECG of 02-Oct-2022 17:18, No significant change since last tracing Confirmed by Lavona Agent ([id number]) on 07/12/2024 12:37:13 PM     Risk Assessment/Calculations:       Physical Exam:   VS:  BP (!) 158/68   Pulse 82   Ht 4' 6 (1.372 m)   Wt 88 lb 12.8 oz (40.3 kg)   SpO2 95%   BMI 21.41 kg/m    Wt Readings from Last 3 Encounters:  07/12/24 88 lb 12.8 oz (40.3 kg)  08/09/23 92 lb 6.4 oz (41.9 kg)  02/24/23 92 lb (41.7 kg)     GEN: Well nourished, well developed in no acute distress NECK: No JVD; No carotid bruits CARDIAC: RRR, no murmurs, rubs, gallops RESPIRATORY:  Clear to auscultation without rales, wheezing or rhonchi  ABDOMEN: Soft, non-tender, non-distended EXTREMITIES: Moderate bilateral right greater than left lower extremity edema; No deformity   ASSESSMENT AND PLAN:       Coronary artery disease: The patient is not having any anginal symptoms.  No change in therapy.  Chronic heart failure with preserved ejection fraction: She does not sound like she has any left-sided failure symptoms.  I am to be managing her lower extremity  as below.  Hypertension: I am going to get rid of the amlodipine  wondering if it might be contributing.  I am going to need to use hydralazine  and since she has tiny I will start with 10 mg 3 times daily though she might need to have this titrated and we should keep a blood pressure diary.  Edema: I am going to change her from 60 mg of furosemide  to 40 mg of torsemide .  Her potassium will be increased to 30 mill equivalents.  She is going to have blood work done next week by her primary provider which will allow us  to look at her renal function and potassium.  I like to try to get her followed up in our clinic in a couple of weeks.  She should keep her feet  elevated.  Follow up as above   Signed, Lynwood Schilling, MD

## 2024-07-12 ENCOUNTER — Ambulatory Visit: Attending: Cardiology | Admitting: Cardiology

## 2024-07-12 ENCOUNTER — Encounter: Payer: Self-pay | Admitting: Cardiology

## 2024-07-12 VITALS — BP 158/68 | HR 82 | Ht <= 58 in | Wt 88.8 lb

## 2024-07-12 DIAGNOSIS — I2510 Atherosclerotic heart disease of native coronary artery without angina pectoris: Secondary | ICD-10-CM

## 2024-07-12 DIAGNOSIS — I10 Essential (primary) hypertension: Secondary | ICD-10-CM

## 2024-07-12 DIAGNOSIS — I5032 Chronic diastolic (congestive) heart failure: Secondary | ICD-10-CM | POA: Diagnosis not present

## 2024-07-12 MED ORDER — TORSEMIDE 40 MG PO TABS: 40.0000 mg | ORAL_TABLET | Freq: Every day | ORAL | 3 refills | Status: AC

## 2024-07-12 MED ORDER — POTASSIUM CHLORIDE ER 10 MEQ PO TBCR: 30.0000 meq | EXTENDED_RELEASE_TABLET | Freq: Every day | ORAL | 3 refills | Status: AC

## 2024-07-12 MED ORDER — HYDRALAZINE HCL 10 MG PO TABS: 10.0000 mg | ORAL_TABLET | Freq: Three times a day (TID) | ORAL | 3 refills | Status: DC

## 2024-07-12 NOTE — Patient Instructions (Signed)
 Medication Instructions:  Stop Amlodipine  Stop Lasix  Start Hydralazine  10 mg 3 times daily Start Tosemide 40 mg once daily Increase potassium chloride  to 30 mEq daily  *If you need a refill on your cardiac medications before your next appointment, please call your pharmacy*  Lab Work: NONE If you have labs (blood work) drawn today and your tests are completely normal, you will receive your results only by: MyChart Message (if you have MyChart) OR A paper copy in the mail If you have any lab test that is abnormal or we need to change your treatment, we will call you to review the results.  Testing/Procedures: NONE  Follow-Up: At [facility], you and your health needs are our priority.  As part of our continuing mission to provide you with exceptional heart care, our providers are all part of one team.  This team includes your primary Cardiologist (physician) and Advanced Practice Providers or APPs (Physician Assistants and Nurse Practitioners) who all work together to provide you with the care you need, when you need it.  Your next appointment:   2 week(s)  Provider:   [facility] in [place]  We recommend signing up for the patient portal called MyChart.  Sign up information is provided on this After Visit Summary.  MyChart is used to connect with patients for Virtual Visits (Telemedicine).  Patients are able to view lab/test results, encounter notes, upcoming appointments, etc.  Non-urgent messages can be sent to your provider as well.   To learn more about what you can do with MyChart, go to [email or web address].

## 2024-07-18 ENCOUNTER — Telehealth: Payer: Self-pay | Admitting: Cardiology

## 2024-07-18 NOTE — Telephone Encounter (Signed)
 Called patients daughter to schedule 2 week f/u per Dr. Lavona. She states that her mom is having lab work done on Friday (09/19) and then goes to see her family doctor next Thursday (09/25). She states that the swelling has gone down and is currently scheduled to see Almarie Crate in [employer] on 09/23 but she may have issues getting her mother to that appointment so she wants to know if it is really necessary for her to come in, please advise.

## 2024-07-20 ENCOUNTER — Telehealth: Payer: Self-pay | Admitting: Cardiology

## 2024-07-20 DIAGNOSIS — I10 Essential (primary) hypertension: Secondary | ICD-10-CM | POA: Diagnosis not present

## 2024-07-20 DIAGNOSIS — M109 Gout, unspecified: Secondary | ICD-10-CM | POA: Diagnosis not present

## 2024-07-20 DIAGNOSIS — I5032 Chronic diastolic (congestive) heart failure: Secondary | ICD-10-CM | POA: Diagnosis not present

## 2024-07-20 DIAGNOSIS — D509 Iron deficiency anemia, unspecified: Secondary | ICD-10-CM | POA: Diagnosis not present

## 2024-07-20 DIAGNOSIS — M10031 Idiopathic gout, right wrist: Secondary | ICD-10-CM | POA: Diagnosis not present

## 2024-07-20 DIAGNOSIS — R809 Proteinuria, unspecified: Secondary | ICD-10-CM | POA: Diagnosis not present

## 2024-07-20 NOTE — Telephone Encounter (Signed)
 Spoke with pt and daughter. Pt started Hydralazine , and Torsemide  one week ago and after starting pt has become weak and unable to do ADL's. She now needs help to complete task that she was doing on her own. Pt c/o being weak on the right side and dizzy. Pt is alert and oriented time 3 to person place and time. Encouraged pt to be seen by PCP. And appt made for today at 11:45. Daughter states that pt is to have labs done today for PCP. Please advise.

## 2024-07-20 NOTE — Telephone Encounter (Signed)
 Extreme weakness, right wrist wont move, dizziness and she had in [place] on last Thursday for edema that has gotten a lot better think maybe it's the fluid pills or, another cardiac medication she has. Started gradually Sunday evening. They were otw to do her labs here this am and, they stopped here.

## 2024-07-24 ENCOUNTER — Ambulatory Visit: Attending: Nurse Practitioner | Admitting: Nurse Practitioner

## 2024-07-24 ENCOUNTER — Telehealth: Payer: Self-pay | Admitting: Nurse Practitioner

## 2024-07-24 ENCOUNTER — Encounter: Payer: Self-pay | Admitting: Nurse Practitioner

## 2024-07-24 VITALS — BP 129/74

## 2024-07-24 DIAGNOSIS — I5032 Chronic diastolic (congestive) heart failure: Secondary | ICD-10-CM

## 2024-07-24 DIAGNOSIS — I2510 Atherosclerotic heart disease of native coronary artery without angina pectoris: Secondary | ICD-10-CM | POA: Diagnosis not present

## 2024-07-24 DIAGNOSIS — I10 Essential (primary) hypertension: Secondary | ICD-10-CM

## 2024-07-24 NOTE — Telephone Encounter (Signed)
 Spoke with daughter who states that hydralazine  was stopped by PCP on 9/19 and given a steroid pack. Daughter is currently speaking with CHARLENA Crate as office visit.

## 2024-07-24 NOTE — Telephone Encounter (Signed)
  Patient Consent for Virtual Visit        Hannah Mckee has provided verbal consent on 07/24/2024 for a virtual visit (video or telephone).   CONSENT FOR VIRTUAL VISIT FOR:  Hannah Mckee  By participating in this virtual visit I agree to the following:  I hereby voluntarily request, consent and authorize [facility] and its employed or contracted physicians, physician assistants, nurse practitioners or other licensed health care professionals (the Practitioner), to provide me with telemedicine health care services (the "Services) as deemed necessary by the treating Practitioner. I acknowledge and consent to receive the Services by the Practitioner via telemedicine. I understand that the telemedicine visit will involve communicating with the Practitioner through live audiovisual communication technology and the disclosure of certain medical information by electronic transmission. I acknowledge that I have been given the opportunity to request an in-person assessment or other available alternative prior to the telemedicine visit and am voluntarily participating in the telemedicine visit.  I understand that I have the right to withhold or withdraw my consent to the use of telemedicine in the course of my care at any time, without affecting my right to future care or treatment, and that the Practitioner or I may terminate the telemedicine visit at any time. I understand that I have the right to inspect all information obtained and/or recorded in the course of the telemedicine visit and may receive copies of available information for a reasonable fee.  I understand that some of the potential risks of receiving the Services via telemedicine include:  Delay or interruption in medical evaluation due to technological equipment failure or disruption; Information transmitted may not be sufficient (e.g. poor resolution of images) to allow for appropriate medical decision making by the  Practitioner; and/or  In rare instances, security protocols could fail, causing a breach of personal health information.  Furthermore, I acknowledge that it is my responsibility to provide information about my medical history, conditions and care that is complete and accurate to the best of my ability. I acknowledge that Practitioner's advice, recommendations, and/or decision may be based on factors not within their control, such as incomplete or inaccurate data provided by me or distortions of diagnostic images or specimens that may result from electronic transmissions. I understand that the practice of medicine is not an exact science and that Practitioner makes no warranties or guarantees regarding treatment outcomes. I acknowledge that a copy of this consent can be made available to me via my patient portal [facility] MyChart), or I can request a printed copy by calling the office of  [facility].    I understand that my insurance will be billed for this visit.   I have read or had this consent read to me. I understand the contents of this consent, which adequately explains the benefits and risks of the Services being provided via telemedicine.  I have been provided ample opportunity to ask questions regarding this consent and the Services and have had my questions answered to my satisfaction. I give my informed consent for the services to be provided through the use of telemedicine in my medical care

## 2024-07-24 NOTE — Progress Notes (Unsigned)
 Virtual Visit via Telephone Note   Because of Hannah Mckee's co-morbid illnesses, she is at least at moderate risk for complications without adequate follow up.  This format is felt to be most appropriate for this patient at this time.  The patient did not have access to video technology/had technical difficulties with video requiring transitioning to audio format only (telephone).  All issues noted in this document were discussed and addressed.  No physical exam could be performed with this format.  Please refer to the patient's chart for her consent to telehealth for [facility].    Date:  07/24/2024 ID:  Hannah Mckee, DOB 1931-04-29, MRN [id number] The patient was identified using 2 identifiers. Patient Location: Home Provider Location: Office/Clinic PCP:  Shona Norleen PEDLAR, MD [facility] Providers Cardiologist:  Alvan Carrier, MD     Evaluation Performed:  Follow-Up Visit  Chief Complaint:  Here for leg swelling follow-up  History of Present Illness:    Hannah Mckee is a 89 y.o. female with a PMH of CAD, chronic HFpEF, leg edema, HTN, who presents today for 2 week follow-up via telephone visit.   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  EF in 2017 was 35 to 40% with improvement to 55 to 60%.  Last seen by Dr. Lavona on 07/12/2024.  Patient noted increased lower extremity swelling.  This had been going on for the past 6 to 8 months with worsening symptoms over the past 3 to 4 weeks.  At that time, was taking 40 mg of Lasix  daily with an extra 20 mg for the most part, she was taking 60 mg every day.  Patient felt that her lower extremity swelling had gotten worse.  Amlodipine  was discontinued.  Was started on hydralazine  10 mg 3 times a day, patient's weight was only 88 pounds.  Furosemide  was  discontinued and she was started on 40 mg of torsemide , potassium supplement was also increased.  Today she presents for scheduled follow-up via telephone visit. History is provided by her daughter who is her historian.  Okay per DPR.  Daughter confirms to me that her leg swelling had been going on for the last 2 to 3 months.  Tells me she could not tolerate hydralazine -while on this medicine she could hardly walk, had weakness, and could not move at times.  Her right wrist also became swollen and hot.  She is doing much better since off this medicine.  She is also taking torsemide  20 mg daily and taking extra half of metoprolol  in the evening.  Does notice some BP elevations towards later in the day prior to when she takes her second dose of metoprolol , however she is weaning off of prednisone  taper for what sounds like an allergic reaction to hydralazine .  I have added hydralazine  to her allergy list.  Daughter tells me she is scheduled to see her PCP this Thursday for follow-up.  PA there has been managing her blood pressure medication.  Daughter confirms leg swelling is gone on new fluid pill.  Doing well currently. Denies any chest pain, shortness of breath, palpitations, syncope, presyncope, dizziness, orthopnea, PND, swelling or significant weight changes, acute bleeding, or claudication.  ROS:   Please see the history of present illness.    All other systems reviewed and are negative.   Prior CV studies:   The following studies  were reviewed today:  Lexiscan  11/2018: There was no ST segment deviation noted during stress. Findings consistent with prior inferior/inferoseptal/septal myocardial infarction with moderate peri-infarct ischemia. This is an intermediate risk study. The left ventricular ejection fraction is hyperdynamic (>65%).  Echo 09/2018: Study Conclusions   - Left ventricle: The cavity size was normal. Wall thickness was    increased in a pattern of mild LVH. Systolic  function was normal.    The estimated ejection fraction was in the range of 55% to 60%.    Wall motion was normal; there were no regional wall motion    abnormalities. Doppler parameters are consistent with abnormal    left ventricular relaxation (grade 1 diastolic dysfunction).  - Aortic valve: Moderately calcified annulus. Trileaflet; mildly    calcified leaflets. There was mild regurgitation.  - Mitral valve: Moderately calcified annulus. There was mild    regurgitation.  - Left atrium: The atrium was moderately dilated.  - Right atrium: The atrium was at the upper limits of normal in    size. Central venous pressure (est): 3 mm Hg.  - Atrial septum: No defect or patent foramen ovale was identified.  - Tricuspid valve: There was mild regurgitation.  - Pulmonary arteries: PA peak pressure: 37 mm Hg (S).  - Pericardium, extracardiac: A prominent pericardial fat pad was    present. A possible, small pericardial effusion was identified    basal anterior to the heart. Labs/Other Tests and Data Reviewed:    EKG:  No ECG reviewed.  Recent Labs: 08/30/2023: BNP 151.0; BUN 10; Creatinine, Ser 0.65; Magnesium  1.8; Potassium 3.7; Sodium 143   Recent Lipid Panel No results found for: CHOL, TRIG, HDL, CHOLHDL, LDLCALC, LDLDIRECT  Wt Readings from Last 3 Encounters:  07/12/24 88 lb 12.8 oz (40.3 kg)  08/09/23 92 lb 6.4 oz (41.9 kg)  02/24/23 92 lb (41.7 kg)     Objective:    Vital Signs:  BP 129/74    Due to nature of today's visit, physical exam was unable to be performed.  ASSESSMENT & PLAN:    HFpEF, leg edema Stage C, NYHA class I symptoms.  EF 55 to 60% in 2019.  Unable to determine volume status due to nature of today's visit, but daughter confirms leg swelling has resolved, sounds like she is well compensated.  No medication changes at this time. Low sodium diet, fluid restriction <2L, and daily weights encouraged. Educated to contact our office for weight gain of 2  lbs overnight or 5 lbs in one week.   CAD Stable with no anginal symptoms. No indication for ischemic evaluation.  Continue current medication regimen.  Heart healthy diet and regular cardiovascular exercise as tolerated encouraged.   HTN Blood pressure tends to become more mildly elevated prior to taking her second dose of metoprolol  in the evening.  This is being managed by PCP.  Cannot tolerate amlodipine  due to leg swelling and cannot tolerate hydralazine -see HPI.  I also confirmed with daughter that blood pressure is more mildly elevated recently due to taking prednisone  taper.  Recommended follow-up with PCP regarding this.  She verbalized understanding.  Patient has upcoming appointment with PCP this week. Continue current medication regimen. Heart healthy diet and regular cardiovascular exercise encouraged. Continue to follow with PCP.       Time:   Today, I have spent 13 minutes with the patient with telehealth technology discussing the above problems.    I spent a total duration of 30 minutes reviewing prior notes,  reviewing outside records including  labs, telephone counseling of medical condition, pathophysiology, evaluation, management, and documenting the findings in the note.    Medication Adjustments/Labs and Tests Ordered: Current medicines are reviewed at length with the patient today.  Concerns regarding medicines are outlined above.   Tests Ordered: No orders of the defined types were placed in this encounter.   Medication Changes: No orders of the defined types were placed in this encounter.   Follow Up:  In Person in 6 week(s)  Signed, Almarie Crate, NP

## 2024-07-24 NOTE — Patient Instructions (Signed)
 Medication Instructions:  Your physician recommends that you continue on your current medications as directed. Please refer to the Current Medication list given to you today.  Labwork: None   Testing/Procedures: None   Follow-Up: Your physician recommends that you schedule a follow-up appointment in:  6-8 weeks with Almarie Crate, NP in office  6 Months with Dr.Branch in office   Any Other Special Instructions Will Be Listed Below (If Applicable).  If you need a refill on your cardiac medications before your next appointment, please call your pharmacy.

## 2024-07-26 DIAGNOSIS — M450 Ankylosing spondylitis of multiple sites in spine: Secondary | ICD-10-CM | POA: Diagnosis not present

## 2024-07-26 DIAGNOSIS — Z8673 Personal history of transient ischemic attack (TIA), and cerebral infarction without residual deficits: Secondary | ICD-10-CM | POA: Diagnosis not present

## 2024-07-26 DIAGNOSIS — J449 Chronic obstructive pulmonary disease, unspecified: Secondary | ICD-10-CM | POA: Diagnosis not present

## 2024-07-26 DIAGNOSIS — I2510 Atherosclerotic heart disease of native coronary artery without angina pectoris: Secondary | ICD-10-CM | POA: Diagnosis not present

## 2024-07-26 DIAGNOSIS — I10 Essential (primary) hypertension: Secondary | ICD-10-CM | POA: Diagnosis not present

## 2024-07-26 DIAGNOSIS — D509 Iron deficiency anemia, unspecified: Secondary | ICD-10-CM | POA: Diagnosis not present

## 2024-07-26 DIAGNOSIS — M109 Gout, unspecified: Secondary | ICD-10-CM | POA: Diagnosis not present

## 2024-07-26 DIAGNOSIS — M6281 Muscle weakness (generalized): Secondary | ICD-10-CM | POA: Diagnosis not present

## 2024-07-26 DIAGNOSIS — I110 Hypertensive heart disease with heart failure: Secondary | ICD-10-CM | POA: Diagnosis not present

## 2024-07-26 DIAGNOSIS — M40209 Unspecified kyphosis, site unspecified: Secondary | ICD-10-CM | POA: Diagnosis not present

## 2024-07-26 DIAGNOSIS — F5101 Primary insomnia: Secondary | ICD-10-CM | POA: Diagnosis not present

## 2024-07-26 DIAGNOSIS — I5032 Chronic diastolic (congestive) heart failure: Secondary | ICD-10-CM | POA: Diagnosis not present

## 2024-08-17 DIAGNOSIS — D509 Iron deficiency anemia, unspecified: Secondary | ICD-10-CM | POA: Diagnosis not present

## 2024-09-04 ENCOUNTER — Encounter: Payer: Self-pay | Admitting: Nurse Practitioner

## 2024-09-04 ENCOUNTER — Ambulatory Visit: Attending: Nurse Practitioner | Admitting: Nurse Practitioner

## 2024-09-04 VITALS — BP 130/70 | HR 66 | Wt 89.4 lb

## 2024-09-04 DIAGNOSIS — I2510 Atherosclerotic heart disease of native coronary artery without angina pectoris: Secondary | ICD-10-CM

## 2024-09-04 DIAGNOSIS — I10 Essential (primary) hypertension: Secondary | ICD-10-CM

## 2024-09-04 DIAGNOSIS — R600 Localized edema: Secondary | ICD-10-CM | POA: Diagnosis not present

## 2024-09-04 DIAGNOSIS — I5032 Chronic diastolic (congestive) heart failure: Secondary | ICD-10-CM | POA: Diagnosis not present

## 2024-09-04 NOTE — Patient Instructions (Signed)
 Medication Instructions:  Your physician recommends that you continue on your current medications as directed. Please refer to the Current Medication list given to you today.  *If you need a refill on your cardiac medications before your next appointment, please call your pharmacy*  Lab Work: NONE   If you have labs (blood work) drawn today and your tests are completely normal, you will receive your results only by: MyChart Message (if you have MyChart) OR A paper copy in the mail If you have any lab test that is abnormal or we need to change your treatment, we will call you to review the results.  Testing/Procedures: NONE   Follow-Up: At [facility], you and your health needs are our priority.  As part of our continuing mission to provide you with exceptional heart care, our providers are all part of one team.  This team includes your primary Cardiologist (physician) and Advanced Practice Providers or APPs (Physician Assistants and Nurse Practitioners) who all work together to provide you with the care you need, when you need it.  Your next appointment:   As Scheduled   Provider:   Dorn Ross, MD    We recommend signing up for the patient portal called MyChart.  Sign up information is provided on this After Visit Summary.  MyChart is used to connect with patients for Virtual Visits (Telemedicine).  Patients are able to view lab/test results, encounter notes, upcoming appointments, etc.  Non-urgent messages can be sent to your provider as well.   To learn more about what you can do with MyChart, go to [email or web address].   Other Instructions Thank you for choosing [facility]!

## 2024-09-04 NOTE — Progress Notes (Unsigned)
 Cardiology Office Note   Date:  09/04/2024 ID:  Hannah Mckee, DOB [birth year], MRN [id number] PCP: Hannah Norleen PEDLAR, MD  [facility] Providers Cardiologist:  Alvan Carrier, MD     History of Present Illness Hannah Mckee is a 89 y.o. female with a PMH of CAD, chronic HFpEF, leg edema, HTN, and ankylosing spondylitis, who presents today for scheduled follow-up.   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  EF in 2017 was 35 to 40% with improvement to 55 to 60%.  Last seen by Dr. Lavona on 07/12/2024.  Patient noted increased lower extremity swelling.  This had been going on for the past 6 to 8 months with worsening symptoms over the past 3 to 4 weeks.  At that time, was taking 40 mg of Lasix  daily with an extra 20 mg for the most part, she was taking 60 mg every day.  Patient felt that her lower extremity swelling had gotten worse.  Amlodipine  was discontinued.  Was started on hydralazine  10 mg 3 times a day, patient's weight was only 88 pounds.  Furosemide  was discontinued and she was started on 40 mg of torsemide , potassium supplement was also increased.  07/24/2024 - Today she presents for scheduled follow-up via telephone visit. History is provided by her daughter who is her historian.  Okay per DPR.  Daughter confirms to me that her leg swelling had been going on for the last 2 to 3 months.  Tells me she could not tolerate hydralazine -while on this medicine she could hardly walk, had weakness, and could not move at times.  Her right wrist also became swollen and hot.  She is doing much better since off this medicine.  She is also taking torsemide  20 mg daily and taking extra half of metoprolol  in the evening.  Does notice some BP elevations towards later in the day prior to when she takes her second dose of metoprolol ,  however she is weaning off of prednisone  taper for what sounds like an allergic reaction to hydralazine .  I have added hydralazine  to her allergy list.  Daughter tells me she is scheduled to see her PCP this Thursday for follow-up.  PA there has been managing her blood pressure medication.  Daughter confirms leg swelling is gone on new fluid pill.  Doing well currently. Denies any chest pain, shortness of breath, palpitations, syncope, presyncope, dizziness, orthopnea, PND, swelling or significant weight changes, acute bleeding, or claudication.  09/04/2024 - She is here for office visit with her daughter and doing well. Daughter says leg swelling has improved greatly. Pt does admit to some occasional shortness of breath late in day noticed while watching TV, fans herself and says symptoms go away. Was on O2 therapy previously, does not wear any currently. Denies any chest pain, palpitations, syncope, presyncope, dizziness, orthopnea, PND, swelling or significant weight changes, acute bleeding, or claudication.  ROS: Negative. See HPI.   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   Studies Reviewed     EKG: EKG is not ordered today.   Lexiscan  11/2018: There was no ST segment deviation noted during stress. Findings consistent with prior inferior/inferoseptal/septal myocardial infarction with moderate peri-infarct ischemia. This is an intermediate risk study. The left ventricular ejection fraction is hyperdynamic (>65%).  Echo 09/2018: Study Conclusions   -  Left ventricle: The cavity size was normal. Wall thickness was    increased in a pattern of mild LVH. Systolic function was normal.    The estimated ejection fraction was in the range of 55% to 60%.    Wall motion was normal; there were no regional wall motion    abnormalities. Doppler parameters are consistent with abnormal    left ventricular relaxation (grade 1  diastolic dysfunction).  - Aortic valve: Moderately calcified annulus. Trileaflet; mildly    calcified leaflets. There was mild regurgitation.  - Mitral valve: Moderately calcified annulus. There was mild    regurgitation.  - Left atrium: The atrium was moderately dilated.  - Right atrium: The atrium was at the upper limits of normal in    size. Central venous pressure (est): 3 mm Hg.  - Atrial septum: No defect or patent foramen ovale was identified.  - Tricuspid valve: There was mild regurgitation.  - Pulmonary arteries: PA peak pressure: 37 mm Hg (S).  - Pericardium, extracardiac: A prominent pericardial fat pad was    present. A possible, small pericardial effusion was identified    basal anterior to the heart.  Physical Exam VS:  BP 130/70 (BP Location: Left Arm, Cuff Size: Small)   Pulse 66   Wt 89 lb 6.4 oz (40.6 kg)   SpO2 100%   BMI 21.56 kg/m        Wt Readings from Last 3 Encounters:  09/04/24 89 lb 6.4 oz (40.6 kg)  07/12/24 88 lb 12.8 oz (40.3 kg)  08/09/23 92 lb 6.4 oz (41.9 kg)    GEN: thin, frail 89 y.o. female in no acute distress NECK: No JVD; No carotid bruits CARDIAC: S1/S2, RRR, no murmurs, rubs, gallops RESPIRATORY:  Clear to auscultation without rales, wheezing or rhonchi  ABDOMEN: Soft, non-tender, non-distended EXTREMITIES:  Nonpitting, localized, trace edema to BLE; No deformity  MSK: Thoracic kyphosis.   ASSESSMENT AND PLAN    HFpEF, leg edema Stage C, NYHA class I symptoms.  EF 55 to 60% in 2019. Overall appears euvolemic and well compensated on exam, trace edema noted to BLE. Unable to tolerate higher doses of Torsemide . No medication changes at this time. Low sodium diet, fluid restriction <2L, and daily weights encouraged. Educated to contact our office for weight gain of 2 lbs overnight or 5 lbs in one week.   CAD Stable with no anginal symptoms. No indication for ischemic evaluation.  Continue current medication regimen.  Heart healthy diet  and regular cardiovascular exercise as tolerated encouraged.  HTN Blood pressure stable. Cannot tolerate amlodipine  due to leg swelling and cannot tolerate hydralazine -see HPI. Allergy list updated. Continue current medication regimen. Heart healthy diet and regular cardiovascular exercise encouraged. Continue to follow with PCP.  I spent a total duration of 30 minutes reviewing prior notes, reviewing outside records including  labs, face-to-face counseling of medical condition, pathophysiology, evaluation, management, and documenting the findings in the note.  Dispo: Follow-up with MD/APP with Dr. Alvan as scheduled or sooner if anything changes.   Signed, Almarie Crate, NP

## 2024-09-28 ENCOUNTER — Other Ambulatory Visit: Payer: Self-pay | Admitting: Cardiology

## 2024-10-01 ENCOUNTER — Ambulatory Visit: Admitting: Cardiology

## 2025-01-17 ENCOUNTER — Ambulatory Visit: Admitting: Cardiology
